# Patient Record
Sex: Male | Born: 1935 | Race: White | Hispanic: No | Smoking: Never smoker
Health system: Southern US, Community
[De-identification: ages and names within clinical notes are randomized; demographics above are authoritative.]

## PROBLEM LIST (undated history)

## (undated) DIAGNOSIS — K769 Liver disease, unspecified: Secondary | ICD-10-CM

## (undated) DIAGNOSIS — J309 Allergic rhinitis, unspecified: Secondary | ICD-10-CM

## (undated) DIAGNOSIS — K219 Gastro-esophageal reflux disease without esophagitis: Secondary | ICD-10-CM

## (undated) DIAGNOSIS — I639 Cerebral infarction, unspecified: Secondary | ICD-10-CM

## (undated) DIAGNOSIS — N400 Enlarged prostate without lower urinary tract symptoms: Secondary | ICD-10-CM

## (undated) DIAGNOSIS — K603 Anal fistula, unspecified: Secondary | ICD-10-CM

## (undated) DIAGNOSIS — Z85820 Personal history of malignant melanoma of skin: Secondary | ICD-10-CM

## (undated) DIAGNOSIS — I340 Nonrheumatic mitral (valve) insufficiency: Secondary | ICD-10-CM

## (undated) DIAGNOSIS — R011 Cardiac murmur, unspecified: Secondary | ICD-10-CM

## (undated) DIAGNOSIS — E7800 Pure hypercholesterolemia, unspecified: Secondary | ICD-10-CM

## (undated) DIAGNOSIS — I2510 Atherosclerotic heart disease of native coronary artery without angina pectoris: Secondary | ICD-10-CM

## (undated) DIAGNOSIS — Z951 Presence of aortocoronary bypass graft: Secondary | ICD-10-CM

## (undated) DIAGNOSIS — Z955 Presence of coronary angioplasty implant and graft: Secondary | ICD-10-CM

## (undated) DIAGNOSIS — Z9889 Other specified postprocedural states: Secondary | ICD-10-CM

## (undated) DIAGNOSIS — I10 Essential (primary) hypertension: Secondary | ICD-10-CM

## (undated) DIAGNOSIS — Z8719 Personal history of other diseases of the digestive system: Secondary | ICD-10-CM

## (undated) DIAGNOSIS — I6522 Occlusion and stenosis of left carotid artery: Secondary | ICD-10-CM

## (undated) DIAGNOSIS — K5710 Diverticulosis of small intestine without perforation or abscess without bleeding: Secondary | ICD-10-CM

## (undated) HISTORY — DX: Gastro-esophageal reflux disease without esophagitis: K21.9

## (undated) HISTORY — DX: Liver disease, unspecified: K76.9

## (undated) HISTORY — PX: CATARACT EXTRACTION W/ INTRAOCULAR LENS  IMPLANT, BILATERAL: SHX1307

## (undated) HISTORY — DX: Atherosclerotic heart disease of native coronary artery without angina pectoris: I25.10

## (undated) HISTORY — DX: Diverticulosis of small intestine without perforation or abscess without bleeding: K57.10

## (undated) HISTORY — PX: TRANSTHORACIC ECHOCARDIOGRAM: SHX275

## (undated) HISTORY — PX: CORONARY ANGIOPLASTY WITH STENT PLACEMENT: SHX49

---
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
## 1986-02-11 HISTORY — PX: CORONARY ARTERY BYPASS GRAFT: SHX141

## 1998-02-11 HISTORY — PX: CORONARY ANGIOPLASTY WITH STENT PLACEMENT: SHX49

## 1998-08-16 ENCOUNTER — Inpatient Hospital Stay (HOSPITAL_COMMUNITY): Admission: EM | Admit: 1998-08-16 | Discharge: 1998-08-18 | Payer: Self-pay | Admitting: Internal Medicine

## 1998-08-16 ENCOUNTER — Encounter: Payer: Self-pay | Admitting: Internal Medicine

## 1999-04-03 ENCOUNTER — Encounter: Payer: Self-pay | Admitting: Geriatric Medicine

## 1999-04-03 ENCOUNTER — Encounter: Admission: RE | Admit: 1999-04-03 | Discharge: 1999-04-03 | Payer: Self-pay | Admitting: Geriatric Medicine

## 1999-07-05 ENCOUNTER — Encounter: Payer: Self-pay | Admitting: Orthopaedic Surgery

## 1999-07-05 ENCOUNTER — Encounter: Admission: RE | Admit: 1999-07-05 | Discharge: 1999-07-05 | Payer: Self-pay | Admitting: Orthopaedic Surgery

## 1999-07-10 ENCOUNTER — Ambulatory Visit (HOSPITAL_BASED_OUTPATIENT_CLINIC_OR_DEPARTMENT_OTHER): Admission: RE | Admit: 1999-07-10 | Discharge: 1999-07-11 | Payer: Self-pay | Admitting: Orthopaedic Surgery

## 1999-07-10 HISTORY — PX: SHOULDER ARTHROSCOPY WITH OPEN ROTATOR CUFF REPAIR: SHX6092

## 2001-02-11 DIAGNOSIS — K5710 Diverticulosis of small intestine without perforation or abscess without bleeding: Secondary | ICD-10-CM

## 2001-02-11 DIAGNOSIS — Z8719 Personal history of other diseases of the digestive system: Secondary | ICD-10-CM

## 2001-02-11 HISTORY — DX: Personal history of other diseases of the digestive system: Z87.19

## 2001-02-11 HISTORY — DX: Diverticulosis of small intestine without perforation or abscess without bleeding: K57.10

## 2001-06-29 ENCOUNTER — Encounter: Admission: RE | Admit: 2001-06-29 | Discharge: 2001-06-29 | Payer: Self-pay | Admitting: Geriatric Medicine

## 2001-06-29 ENCOUNTER — Encounter: Payer: Self-pay | Admitting: Geriatric Medicine

## 2001-09-18 ENCOUNTER — Encounter: Payer: Self-pay | Admitting: Internal Medicine

## 2001-09-18 ENCOUNTER — Encounter: Admission: RE | Admit: 2001-09-18 | Discharge: 2001-09-18 | Payer: Self-pay | Admitting: Internal Medicine

## 2001-09-29 ENCOUNTER — Encounter: Admission: RE | Admit: 2001-09-29 | Discharge: 2001-09-29 | Payer: Self-pay | Admitting: Geriatric Medicine

## 2001-09-29 ENCOUNTER — Encounter: Payer: Self-pay | Admitting: Geriatric Medicine

## 2002-02-11 DIAGNOSIS — K769 Liver disease, unspecified: Secondary | ICD-10-CM

## 2002-02-11 HISTORY — DX: Liver disease, unspecified: K76.9

## 2002-02-25 ENCOUNTER — Encounter (INDEPENDENT_AMBULATORY_CARE_PROVIDER_SITE_OTHER): Payer: Self-pay | Admitting: *Deleted

## 2002-02-25 ENCOUNTER — Ambulatory Visit (HOSPITAL_COMMUNITY): Admission: RE | Admit: 2002-02-25 | Discharge: 2002-02-25 | Payer: Self-pay | Admitting: Gastroenterology

## 2002-12-30 ENCOUNTER — Ambulatory Visit (HOSPITAL_COMMUNITY): Admission: RE | Admit: 2002-12-30 | Discharge: 2002-12-30 | Payer: Self-pay | Admitting: General Surgery

## 2004-04-14 ENCOUNTER — Emergency Department (HOSPITAL_COMMUNITY): Admission: EM | Admit: 2004-04-14 | Discharge: 2004-04-14 | Payer: Self-pay | Admitting: Emergency Medicine

## 2004-04-24 ENCOUNTER — Inpatient Hospital Stay (HOSPITAL_BASED_OUTPATIENT_CLINIC_OR_DEPARTMENT_OTHER): Admission: RE | Admit: 2004-04-24 | Discharge: 2004-04-24 | Payer: Self-pay | Admitting: Interventional Cardiology

## 2004-04-30 ENCOUNTER — Ambulatory Visit (HOSPITAL_COMMUNITY): Admission: RE | Admit: 2004-04-30 | Discharge: 2004-05-01 | Payer: Self-pay | Admitting: Interventional Cardiology

## 2004-05-02 ENCOUNTER — Inpatient Hospital Stay (HOSPITAL_COMMUNITY): Admission: EM | Admit: 2004-05-02 | Discharge: 2004-05-03 | Payer: Self-pay | Admitting: Emergency Medicine

## 2004-05-10 ENCOUNTER — Ambulatory Visit (HOSPITAL_COMMUNITY): Admission: RE | Admit: 2004-05-10 | Discharge: 2004-05-10 | Payer: Self-pay | Admitting: Geriatric Medicine

## 2004-06-11 ENCOUNTER — Encounter (HOSPITAL_COMMUNITY): Admission: RE | Admit: 2004-06-11 | Discharge: 2004-09-09 | Payer: Self-pay | Admitting: Interventional Cardiology

## 2004-09-11 ENCOUNTER — Encounter (HOSPITAL_COMMUNITY): Admission: RE | Admit: 2004-09-11 | Discharge: 2004-12-10 | Payer: Self-pay | Admitting: Interventional Cardiology

## 2005-04-26 ENCOUNTER — Ambulatory Visit (HOSPITAL_COMMUNITY): Admission: RE | Admit: 2005-04-26 | Discharge: 2005-04-26 | Payer: Self-pay | Admitting: Geriatric Medicine

## 2005-08-22 ENCOUNTER — Encounter: Admission: RE | Admit: 2005-08-22 | Discharge: 2005-08-22 | Payer: Self-pay | Admitting: Geriatric Medicine

## 2006-10-13 ENCOUNTER — Emergency Department (HOSPITAL_COMMUNITY): Admission: EM | Admit: 2006-10-13 | Discharge: 2006-10-13 | Payer: Self-pay | Admitting: Emergency Medicine

## 2007-02-25 ENCOUNTER — Encounter: Admission: RE | Admit: 2007-02-25 | Discharge: 2007-02-25 | Payer: Self-pay | Admitting: Geriatric Medicine

## 2007-09-05 ENCOUNTER — Emergency Department (HOSPITAL_COMMUNITY): Admission: EM | Admit: 2007-09-05 | Discharge: 2007-09-05 | Payer: Self-pay | Admitting: Emergency Medicine

## 2007-09-14 ENCOUNTER — Observation Stay (HOSPITAL_COMMUNITY): Admission: EM | Admit: 2007-09-14 | Discharge: 2007-09-15 | Payer: Self-pay | Admitting: Emergency Medicine

## 2007-12-26 ENCOUNTER — Emergency Department (HOSPITAL_COMMUNITY): Admission: EM | Admit: 2007-12-26 | Discharge: 2007-12-26 | Payer: Self-pay | Admitting: Emergency Medicine

## 2010-06-14 ENCOUNTER — Other Ambulatory Visit: Payer: Self-pay | Admitting: Dermatology

## 2010-06-26 NOTE — H&P (Signed)
Jason Velazquez, MIN NO.:  [id number]   MEDICAL RECORD NO.:  [id number]          PATIENT TYPE:  OBV   LOCATION:  4729                         FACILITY:  [facility]   PHYSICIAN:  Georga Hacking, M.D.DATE OF BIRTH:  18-Aug-1928   DATE OF ADMISSION:  09/14/2007  DATE OF DISCHARGE:                              HISTORY & PHYSICAL   REASON FOR ADMISSION:  Chest pain, irregular heartbeat, cough.   HISTORY OF PRESENT ILLNESS:  This 77 year old male has a history of  hyperlipidemia, mild hypertension and had a bypass grafting in 1988 for  angina.  He had stenting of the circumflex and a graft in 2000, and in  2006 was demonstrated to have a patent internal mammary graft to LAD,  patent sequential vein graft to diagonal I and II, a patent vein graft  to the left ventricular branch, and in-stent restenosis of the  circumflex with a J old side branch.  He had PTCA of the side branch and  placement of two drug-eluting stents by Dr. Katrinka Blazing to the circumflex.  There was no insignificant disease of the right coronary artery at that  time.  He has done well since then.  Evidently in May, at a routine  visit Dr. Katrinka Blazing, his atenolol was reduced because of bradycardia.  About  2 weeks ago, he began to experience coughing and felt as if he was  having irregular heart beat and PVCs.  He was seen at the emergency room  on the night of July 25 and was told he had PVCs and was treated with  Protonix.  He was seen at Dr. Venita Sheffield office who sent him to see Dr.  Katrinka Blazing.  He was seen evidently by Dr. Michaelle Copas nurse practitioner who  ordered a Holter monitor.  The Holter monitor showed that he wore this.  Evidently he had been feeling well today.  This afternoon, his wife is  in the process of checking his blood pressures and thought it to be  slightly elevated.  About that time, the patient noted some vague right-  sided aching pain and may have had some mild nausea associated with it.  About  that time, Dr. Michaelle Copas office called with results of the Holter  monitor, and the wife began to describe these symptoms to the office.  They were then advised to give him a nitroglycerin and transport him to  the emergency room.  His coughing, at the present time is concerned that  the coughing in the PVCs are related.  He is not currently having chest  discomfort.  The discomfort he had is different than the previous  symptoms that he had of the time of this bypass.   PAST MEDICAL HISTORY:  1. Mild hypertension.  2. Hyperlipidemia.  3. Mild reflux.  4. Mild prostatism.   PAST SURGERIES:  1. Coronary bypass grafting.  2 . Removal of melanoma.  1. Tonsillectomy.   ALLERGIES:  None.   CURRENT MEDICATIONS:  1. Atenolol 25 mg daily.  2. Hytrin 5 mg daily.  3. Lipitor 20 mg daily.  4. Aspirin 325 mg daily.  5. Protonix 40 mg daily.  6. Fish oil b.i.d.  7. Niaspan 1000 mg at bedtime.   SOCIAL HISTORY:  He has been married.  His daughter is head of nursing  at [employer].  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  Does not smoke or use alcohol to excess.   REVIEW OF SYSTEMS:  His weight has been stable.  He wears glasses.  No  eye, ear, nose or throat problems.  No difficulty swallowing.  He has  had significant cough but has not had fever or other symptoms.  Has not  had any upper respiratory symptoms.  He has had nasal congestion and has  used Afrin in the past, but none recently.  Some sense of some mild  prostatism with nocturia and mild hesitancy.  No history of stroke or  TIA.  No diarrhea or constipation.  Other than as noted above, the  remained for review of systems is unremarkable.   PHYSICAL EXAMINATION:  GENERAL:  He is a pleasant male appearing stated  age.  VITAL SIGNS:  Blood pressure is currently 135/70, pulse is currently 70  and regular.  On observation, the patient was noted be coughing with no  PVCs noted and other times had  PVCs that he was not aware of.  His EKG  was normal except for occasional PVCs.  SKIN:  Warm and dry.  Previous melanoma scar noted.  ENT:  EOMI.  CNS clear.  Fundi not examined.  Pharynx negative.  NECK:  Supple.  No masses, JVD, thyromegaly or bruits.  LUNGS:  Clear.  CARDIOVASCULAR:  Normal S1, S2.  No S3 or murmur.  ABDOMEN:  Soft, nontender.  Femoral distal pulses 2+, no edema.  Point  care enzymes were negative.  EKG as noted above.   STUDIES:  Chest x-ray is unremarkable.   IMPRESSION:  1. Atypical chest pain, doubt angina.  2. Coronary artery disease with previous bypass grafting, previous      stenting of the circumflex and a vein graft.  3. PVCs.  4. Cough of uncertain cause.  5. Hyperlipidemia.  6. Symptoms of prostatism.   RECOMMENDATIONS:  Admit overnight for observation.  Keep n.p.o. if Dr.  Katrinka Blazing wants to do testing in house.  Obtain echocardiogram.  Continue  current medicines.  Treat with DVT prophylaxis for Lovenox.  Continue  current medications at this time.  Discussed atrial PVCs with family.      Georga Hacking, M.D.  Electronically Signed     WST/MEDQ  D:  09/14/2007  T:  09/15/2007  Job:  [id number]   cc:   Lyn Records, M.D.  Georgann Housekeeper, MD

## 2010-06-29 NOTE — Cardiovascular Report (Signed)
NAMEJENSYN, Jason Velazquez NO.:  [id number]   MEDICAL RECORD NO.:  [id number]          PATIENT TYPE:  OIB   LOCATION:  6501                         FACILITY:  [facility]   PHYSICIAN:  Lyn Records III, M.D.DATE OF BIRTH:  May 23, 1928   DATE OF PROCEDURE:  04/24/2004  DATE OF DISCHARGE:                              CARDIAC CATHETERIZATION   INDICATIONS:  Recent angina. Patient has history of coronary artery bypass  grafting 1997. Had stent placed in in the circumflex artery 2000 and also  bypass graft to the right coronary in 2000. He has recently had recurrence  of angina.   PROCEDURE PERFORMED:  1.  Left heart catheterization.  2.  Selective coronary angiography.  3.  Left ventriculography.  4.  Bypass graft angiography.  5.  Left internal mammary artery graft angiography.   DESCRIPTION:  After informed consent a 4-French sheath was placed in the  right femoral artery using the modified Seldinger technique. A 4-French A2  multipurpose catheter was used for hemodynamic recordings, left  ventriculography by hand injection, and selective right and saphenous vein  graft angiography. A #4 4-French left Judkins catheter was used for left  coronary angiography. An internal mammary artery catheter was used for left  internal mammary artery angiography. The patient tolerated the procedure  without complications. 100 mcg of intracoronary nitroglycerin was given into  the left main coronary. No complications occurred.   RESULTS:   I. HEMODYNAMIC DATA:  A.  Left ventricular pressure 121/80.  B.  Aortic pressure 120/60 mmHg.   II. LEFT VENTRICULOGRAPHY:  The left ventricle is normal in size and  demonstrates normal overall contractility, EF 60%. No MR.   III. NATIVE CORONARY ANGIOGRAPHY:  A.  Left main coronary:  The left main  coronary artery is widely patent.  B.  Left anterior descending coronary:  The left anterior descending  coronary artery is totally occluded at its  origin from the left main.  C.  Circumflex artery:  The circumflex coronary artery is large. It  trifurcates on the left lateral wall. There is a small AV groove circumflex  branch that contains ostial 70% narrowing. This is jailed by a stent. Within  the proximal stent in the circumflex there is 90% in-stent restenosis.  D.  Right coronary:  The right coronary artery is patent. Multiple luminal  irregularities are noted throughout the native right.  No high-grade  obstruction is noted. The PDA feeds off of the native right and contains 50%  ostial narrowing, but is otherwise widely patent.   IV. BYPASS GRAFT ANGIOGRAPHY:  A.  Saphenous vein sequential graft to the  first and second diagonal branches:  This graft is widely patent with  luminal irregularities noted.  B.  Saphenous vein graft to the left ventricular branch:  This graft is  widely patent. This graft at the site of previous stent implantation.   V. LEFT INTERNAL MAMMARY GRAFT TO THE LAD:  The LIMA to the LAD is widely  patent.   CONCLUSION:  1.  Significant in-stent restenosis in the proximal circumflex from the  stents placed in 2000.  2.  Widely patent saphenous vein graft as outlined above.  3.  Widely patent left internal mammary artery to the left anterior      descending.  4.  Total occlusion of the left anterior descending at the ostium of the      left main.  5.  Total occlusion of the right coronary beyond the posterior descending      artery branch. The right coronary otherwise does not contain significant      obstruction and the posterior descending artery contains 50% ostial      narrowing.   PLAN:  PCI and stent of the circumflex in-stent restenosis as noted.      HWS/MEDQ  D:  04/24/2004  T:  04/24/2004  Job:  [id number]   cc:   Hal T. Stoneking, M.D.  [address] [place], [place] [address]  Fax: [phone]

## 2010-06-29 NOTE — Op Note (Signed)
   NAMEQUANDARIUS, NILL NO.:  [id number]   MEDICAL RECORD NO.:  [id number]                   PATIENT TYPE:  AMB   LOCATION:  ENDO                                 FACILITY:  [facility]   PHYSICIAN:  Danise Edge, M.D.                DATE OF BIRTH:  1928-04-01   DATE OF PROCEDURE:  02/25/2002  DATE OF DISCHARGE:                                 OPERATIVE REPORT   REFERRING PHYSICIAN:  Hal T. Stoneking, M.D.   PROCEDURE:  Colonoscopy and polypectomy.   INDICATIONS:  The patient is a 77 year old male born 04-29-1928. The  patient had an episode of painless hematochezia which has resolved. He is  scheduled to undergo diagnostic colonoscopy and possible polypectomy to  prevent colon cancer.   ENDOSCOPIST:  Danise Edge, M.D.   PREMEDICATION:  1. Versed 5 mg.  2. Demerol 50 mg.   ENDOSCOPE:  Olympus pediatric colonoscope.   DESCRIPTION OF PROCEDURE:  After informed consent was obtained the patient  was placed in the left lateral decubitus position. I administered  intravenous Demerol and intravenous Versed to achieve conscious sedation for  the procedure. The patient's blood pressure, O2 saturation and cardiac  rhythm were monitored throughout the procedure and documented in the medical  record.   Anal inspection was normal. Digital rectal exam revealed a nonnodular  prostate. The Olympus pediatric video colonoscope was introduced into the  rectum advanced to the cecum. A normal ileocecal valve was intubated and the  distal ileum was inspected. Colonic preparation for the examination today  was excellent.   Rectum:  Normal.  Sigmoid colon and descending colon:  Normal.  Splenic flexure:  Normal.  Transverse colon:  Normal.  Hepatic flexure:  Normal.  Ascending colon:  Normal.  Cecum and ileocecal valve:  From the proximal cecum, a 3 mm sessile polyp  was lifted by submucosal saline injection and removed with the  electrocautery snare.  Biopsies prior to  polypectomy were sent for  pathological evaluation. I was unable to retrieve the entire polyp post  submucosal saline injection and polypectomy.    ASSESSMENT:  A 3 mm sessile polyp was removed from the proximal cecum by  submucosal saline lift with electrocautery snare polypectomy Otherwise  normal proctocolonoscopy to the cecum.   RECOMMENDATIONS:  Repeat colonoscopy in 5 years.                                                Danise Edge, M.D.    MJ/MEDQ  D:  02/25/2002  T:  02/25/2002  Job:  [id number]   cc:   Hal T. Stoneking, M.D.  [address] [place], [place] [address]  Fax: [phone]

## 2010-06-29 NOTE — Op Note (Signed)
[place]  Patient:    Jason Velazquez, Jason Velazquez                MRN: [id number] Proc. Date: 07/10/99 Adm. Date:  [id number] Attending:  Marcene Corning                           Operative Report  PREOPERATIVE DIAGNOSES: 1. Right shoulder rotator cuff tear. 2. Right shoulder degeneration.  POSTOPERATIVE DIAGNOSES: 1. Right shoulder rotator cuff tear. 2. Right shoulder degeneration.  PROCEDURES: 1. Right shoulder arthroscopic debridement. 2. Right shoulder open rotator cuff repair.  ANESTHESIA:  General and block.  ATTENDING SURGEON:  Lubertha Basque. Jerl Santos, M.D.  ASSISTANT:  Prince Rome, P.A.  INDICATIONS FOR PROCEDURE:  The patient is a 77 year old man with a many month history of severe right shoulder pain.  This has persisted despite time, activity restriction, and two subacromial injections, both of which afforded him only transient relief.  He was presumed to have a full-thickness rotator cuff tear as he had some significant weakness.  The planned procedure at this point is for arthroscopic intervention with probable arthroscopic or open repair.  INFORMED CONSENT:  The procedures were discussed with the patient, and informed operative consent was obtained after discussion of possible complications of reaction to anesthesia, infection, and shoulder stiffness.  DESCRIPTION OF PROCEDURE:  The patient was taken to the operating suite where a general anesthetic was applied after a regional block was applied in the preanesthesia area.  He was positioned in beach chair position and prepped and draped in the normal sterile fashion.  After the administration of preoperative IV antibiotics, an arthroscopy of the right shoulder was performed through two portals.  The glenohumeral joint showed mild degenerative changes with some mild fraying of the labrum, which was addressed with a debridement.  The biceps tendon and anchor were intact.  He  had a large rotator cuff tear involving about 4 cm at the attachment of the cuff. However, this tissue could be mobilized back to the greater tuberosity.  An arthroscopic acromioplasty was performed back to a flat surface.  The undersurface of the [facility] joint was examined and was decompressed, but no formal decompression of the joint was performed.  At this point, the lateral portal was extended into about a 1 inch incision.  Dissection was carried down through the deltoid fibers to the bursa, which was excised.  The rotator cuff tear was easily visualized.  Two tag stitches were placed in the cuff, and it could be pulled over to the greater tuberosity area.  A bur was used to freshen the bone below this area, followed by placement of two Bio corkscrew anchors.  Each of these had two #2 Ethibond sutures emanating.  These sutures were then passed through the rotator cuff in mattress fashion.  The tag sutures on the cuff were passed through the greater tuberosity through bony tunnels.  These were also Ethibond sutures, and they were tied over the greater tuberosity.  The four other Ethibond sutures were also tied in mattress fashion on top of the cuff, bringing the structure back down into a nice, bony bed of bleeding bone.  The cuff appeared to be well repaired, and it was under no undue tension even with the arm at the patients side.  The wound was thoroughly irrigated, followed by closure of both layers of deltoid fascia with #0 Vicryl suture.  Subcutaneous tissues were reapproximated with  2-0 undyed Vicryl, followed by skin closure with nylon.  Adaptic was placed on the wound, followed by dry gauze and tape.  The patient was placed in a sling. Estimated blood loss and intraoperative fluids can be obtained from anesthesia records.  DISPOSITION:  The patient was extubated in the operating room and taken to the recovery room in stable condition.  PLAN:  For him to stay over night for pain  control.  He will go home in the morning. DD:  07/10/99 TD:  07/11/99 Job: [id number]

## 2010-06-29 NOTE — Cardiovascular Report (Signed)
NAMEDARRAGH, NAY NO.:  [id number]   MEDICAL RECORD NO.:  [id number]          PATIENT TYPE:  OIB   LOCATION:  2899                         FACILITY:  [facility]   PHYSICIAN:  Lyn Records III, M.D.DATE OF BIRTH:  [birth year]   DATE OF PROCEDURE:  04/30/2004  DATE OF DISCHARGE:                              CARDIAC CATHETERIZATION   INDICATIONS:  Recent angina and demonstrated in-stent restenosis in the  proximal circumflex and mid circumflex beyond a stent strut. The patient has  been having exertional angina.   PROCEDURE:  1.  Left heart catheterization.  2.  Stent of the proximal and mid circumflex with drug-eluting stent, PTCA      of the mid circumflex through stent strut.   DESCRIPTION:  The patient was brought to the cath lab. The patient had  previously undergone instructions concerning the stent procedure and risks.  A 7-French sheath was started in the right femoral artery using modified  Seldinger technique. Angiomax bolus followed by an infusion was started. ACT  was documented greater than 400 seconds. We then used a double wire  technique. One BMW wire down the obtuse marginal/circumflex, the other down  the circumflex and through the distal stent into the mid circumflex. We then  dilated the mid circumflex restenotic region through the stent strut with a  20 x 9 Maverick balloon. Two balloon inflations were performed. Pressure of  up to 14 atmospheres. We then predilated the proximal circumflex in-stent  restenosis with a 3.0 x 12 mm Maverick balloon and then deployed a 3.0 x 23-  mm  long Cypher stent totally encompassing the previously tandemly stented  region. The peak pressure was up to 16 atmospheres. We then postdilated the  proximal stent region with a 3.25 x 12 mm Quantum balloon at 13 atmospheres.  TIMI grade 3 flow was noted. The mid circumflex with two layers of stent  remained patent through the stent struts. TIMI grade 3 flow was noted.  No  complications occurred. AngioSeal arteriotomy closure was performed without  complications. Visualization of the femoral region was performed prior to  Angio-Seal deployment.   CONCLUSION:  1.  Successful circumflex in-stent restenosis therapy with a 23 mm long x      3.0 mm Cypher stent totally covering the previously stented region.  2.  Successful angioplasty of high-grade restenosis in the mid circumflex      via the previously placed stent with reduction in stenosis from 95% to      80% with TIMI III flow.   PLAN:  Discontinue Angiomax.  Aspirin and Plavix. Plavix should be continued  for least the year.      HWS/MEDQ  D:  04/30/2004  T:  04/30/2004  Job:  [id number]   cc:   Hal T. Stoneking, M.D.  [address] [place], [place] [address]  Fax: [phone]

## 2010-11-09 LAB — CK TOTAL AND CKMB (NOT AT ARMC): Relative Index: 1.9

## 2010-11-09 LAB — POCT CARDIAC MARKERS
CKMB, poc: 1.1
Myoglobin, poc: 102
Myoglobin, poc: 68.7
Troponin i, poc: <0.05
Troponin i, poc: <0.05

## 2010-11-09 LAB — DIFFERENTIAL
Basophils Absolute: 0
Basophils Relative: 1
Lymphs Abs: 1.9
Monocytes Relative: 10
Neutro Abs: 3.8
Neutrophils Relative %: 58

## 2010-11-09 LAB — CBC
HCT: 37.8 — ABNORMAL LOW
Hemoglobin: 12.8 — ABNORMAL LOW
MCHC: 33.6
MCV: 94.4
RBC: 4.41
RDW: 12.6
RDW: 12.6

## 2010-11-09 LAB — POCT I-STAT, CHEM 8
BUN: 11
Chloride: 99
Creatinine, Ser: 1.1
Glucose, Bld: 95
Hemoglobin: 14.3
Potassium: 4.3
Sodium: 136

## 2010-11-09 LAB — CARDIAC PANEL(CRET KIN+CKTOT+MB+TROPI)
CK, MB: 1.7
Relative Index: 1.4
Total CK: 121

## 2010-11-09 LAB — TSH: TSH: 7.953 — ABNORMAL HIGH

## 2010-11-09 LAB — COMPREHENSIVE METABOLIC PANEL
Albumin: 3.7
BUN: 6
Creatinine, Ser: 0.91
Glucose, Bld: 136 — ABNORMAL HIGH
Potassium: 3.4 — ABNORMAL LOW
Total Protein: 6.1

## 2010-11-09 LAB — PROTIME-INR: INR: 1

## 2010-11-13 LAB — BASIC METABOLIC PANEL
BUN: 9
CO2: 26
Calcium: 9.1
Chloride: 100
Creatinine, Ser: 0.87
GFR calc Af Amer: >60

## 2010-11-13 LAB — DIFFERENTIAL
Basophils Absolute: 0
Basophils Relative: 1
Eosinophils Absolute: 0
Monocytes Absolute: 0.6
Monocytes Relative: 9
Neutro Abs: 4.2

## 2010-11-13 LAB — URINALYSIS, ROUTINE W REFLEX MICROSCOPIC
Bilirubin Urine: NEGATIVE
Glucose, UA: NEGATIVE
Hgb urine dipstick: NEGATIVE
Ketones, ur: NEGATIVE
Protein, ur: NEGATIVE
Urobilinogen, UA: 0.2

## 2010-11-13 LAB — CBC
MCHC: 33.7
MCV: 94.8
Platelets: 240
RBC: 4.44
WBC: 6.1

## 2010-11-13 LAB — CK TOTAL AND CKMB (NOT AT ARMC)
CK, MB: 2.4
Total CK: 152

## 2010-11-23 LAB — I-STAT 8, (EC8 V) (CONVERTED LAB)
BUN: 4 — ABNORMAL LOW
Bicarbonate: 22.5
Chloride: 104
Glucose, Bld: 95
HCT: 42
Operator id: 279831
pCO2, Ven: 43 — ABNORMAL LOW
pH, Ven: 7.326 — ABNORMAL HIGH

## 2010-11-23 LAB — POCT I-STAT CREATININE
Creatinine, Ser: 0.9
Operator id: 279831

## 2010-11-23 LAB — CLOSTRIDIUM DIFFICILE EIA: C difficile Toxins A+B, EIA: NEGATIVE

## 2010-11-23 LAB — STOOL CULTURE

## 2011-02-13 DIAGNOSIS — N401 Enlarged prostate with lower urinary tract symptoms: Secondary | ICD-10-CM | POA: Diagnosis not present

## 2011-02-13 DIAGNOSIS — N138 Other obstructive and reflux uropathy: Secondary | ICD-10-CM | POA: Diagnosis not present

## 2011-02-13 DIAGNOSIS — N3941 Urge incontinence: Secondary | ICD-10-CM | POA: Diagnosis not present

## 2011-02-13 DIAGNOSIS — R339 Retention of urine, unspecified: Secondary | ICD-10-CM | POA: Diagnosis not present

## 2011-02-13 DIAGNOSIS — R39198 Other difficulties with micturition: Secondary | ICD-10-CM | POA: Diagnosis not present

## 2011-02-19 ENCOUNTER — Telehealth: Payer: Self-pay | Admitting: Cardiology

## 2011-02-19 NOTE — Telephone Encounter (Signed)
I spoke with Pam and made her aware that this is Dr Sherilyn Cooter Smith's patient.

## 2011-02-19 NOTE — Telephone Encounter (Signed)
New Msg: Pam from Dr. Belva Crome office calling from surgical clearance for TURP. Please return call to discuss further.

## 2011-02-26 DIAGNOSIS — I059 Rheumatic mitral valve disease, unspecified: Secondary | ICD-10-CM | POA: Diagnosis not present

## 2011-02-26 DIAGNOSIS — I10 Essential (primary) hypertension: Secondary | ICD-10-CM | POA: Diagnosis not present

## 2011-02-26 DIAGNOSIS — I2510 Atherosclerotic heart disease of native coronary artery without angina pectoris: Secondary | ICD-10-CM | POA: Diagnosis not present

## 2011-03-01 DIAGNOSIS — I059 Rheumatic mitral valve disease, unspecified: Secondary | ICD-10-CM | POA: Diagnosis not present

## 2011-03-12 ENCOUNTER — Other Ambulatory Visit: Payer: Self-pay | Admitting: Urology

## 2011-03-27 ENCOUNTER — Other Ambulatory Visit: Payer: Self-pay | Admitting: Geriatric Medicine

## 2011-03-27 DIAGNOSIS — G459 Transient cerebral ischemic attack, unspecified: Secondary | ICD-10-CM | POA: Diagnosis not present

## 2011-03-27 DIAGNOSIS — R200 Anesthesia of skin: Secondary | ICD-10-CM

## 2011-03-27 DIAGNOSIS — I10 Essential (primary) hypertension: Secondary | ICD-10-CM | POA: Diagnosis not present

## 2011-03-28 ENCOUNTER — Ambulatory Visit
Admission: RE | Admit: 2011-03-28 | Discharge: 2011-03-28 | Disposition: A | Discharge status: Home / Self Care | Payer: Medicare Other | Source: Ambulatory Visit | Attending: Geriatric Medicine | Admitting: Geriatric Medicine

## 2011-03-28 DIAGNOSIS — G459 Transient cerebral ischemic attack, unspecified: Secondary | ICD-10-CM | POA: Diagnosis not present

## 2011-03-28 DIAGNOSIS — I6529 Occlusion and stenosis of unspecified carotid artery: Secondary | ICD-10-CM | POA: Diagnosis not present

## 2011-03-28 DIAGNOSIS — R200 Anesthesia of skin: Secondary | ICD-10-CM

## 2011-03-28 DIAGNOSIS — R202 Paresthesia of skin: Secondary | ICD-10-CM

## 2011-03-28 DIAGNOSIS — R209 Unspecified disturbances of skin sensation: Secondary | ICD-10-CM | POA: Diagnosis not present

## 2011-04-02 ENCOUNTER — Encounter (HOSPITAL_COMMUNITY): Payer: Self-pay | Admitting: Pharmacy Technician

## 2011-04-03 ENCOUNTER — Ambulatory Visit (HOSPITAL_COMMUNITY)
Admission: RE | Admit: 2011-04-03 | Discharge: 2011-04-03 | Disposition: A | Discharge status: Home / Self Care | Payer: Medicare Other | Source: Ambulatory Visit | Attending: Urology | Admitting: Urology

## 2011-04-03 ENCOUNTER — Encounter (HOSPITAL_COMMUNITY): Payer: Self-pay

## 2011-04-03 ENCOUNTER — Encounter (HOSPITAL_COMMUNITY)
Admission: RE | Admit: 2011-04-03 | Discharge: 2011-04-03 | Disposition: A | Discharge status: Home / Self Care | Payer: Medicare Other | Source: Ambulatory Visit | Attending: Urology | Admitting: Urology

## 2011-04-03 DIAGNOSIS — N320 Bladder-neck obstruction: Secondary | ICD-10-CM | POA: Insufficient documentation

## 2011-04-03 DIAGNOSIS — Z01812 Encounter for preprocedural laboratory examination: Secondary | ICD-10-CM | POA: Insufficient documentation

## 2011-04-03 DIAGNOSIS — I2510 Atherosclerotic heart disease of native coronary artery without angina pectoris: Secondary | ICD-10-CM | POA: Insufficient documentation

## 2011-04-03 DIAGNOSIS — Z01818 Encounter for other preprocedural examination: Secondary | ICD-10-CM | POA: Insufficient documentation

## 2011-04-03 DIAGNOSIS — N401 Enlarged prostate with lower urinary tract symptoms: Secondary | ICD-10-CM | POA: Insufficient documentation

## 2011-04-03 DIAGNOSIS — Z951 Presence of aortocoronary bypass graft: Secondary | ICD-10-CM | POA: Diagnosis not present

## 2011-04-03 DIAGNOSIS — N138 Other obstructive and reflux uropathy: Secondary | ICD-10-CM | POA: Insufficient documentation

## 2011-04-03 DIAGNOSIS — Z01811 Encounter for preprocedural respiratory examination: Secondary | ICD-10-CM | POA: Diagnosis not present

## 2011-04-03 HISTORY — DX: Pure hypercholesterolemia, unspecified: E78.00

## 2011-04-03 HISTORY — DX: Cardiac murmur, unspecified: R01.1

## 2011-04-03 HISTORY — DX: Benign prostatic hyperplasia without lower urinary tract symptoms: N40.0

## 2011-04-03 HISTORY — DX: Atherosclerotic heart disease of native coronary artery without angina pectoris: I25.10

## 2011-04-03 HISTORY — DX: Essential (primary) hypertension: I10

## 2011-04-03 LAB — CBC
HCT: 38.6 % — ABNORMAL LOW (ref 39.0–52.0)
Platelets: 201 10*3/uL (ref 150–400)
RBC: 4.22 MIL/uL (ref 4.22–5.81)
RDW: 12.4 % (ref 11.5–15.5)
WBC: 6.2 10*3/uL (ref 4.0–10.5)

## 2011-04-03 LAB — SURGICAL PCR SCREEN: Staphylococcus aureus: NEGATIVE

## 2011-04-03 LAB — BASIC METABOLIC PANEL
BUN: 13 mg/dL (ref 6–23)
CO2: 28 mEq/L (ref 19–32)
Chloride: 98 mEq/L (ref 96–112)
GFR calc Af Amer: 90 mL/min — ABNORMAL LOW (ref >90)
Potassium: 4.7 mEq/L (ref 3.5–5.1)

## 2011-04-03 NOTE — Patient Instructions (Signed)
[address]rist  04/03/2011   Your procedure is scheduled on:  04-09-11  Report to [facility] at 515  AM.  Call this number if you have problems the morning of surgery: [phone]   Remember:   Do not eat food or drink liquids:After Midnight.  .  Take these medicines the morning of surgery with A SIP OF WATER: atenolol, saline nasal spray   Do not wear jewelry or make up.  Do not wear lotions, powders, or perfumes.Do not wear deodorant.    Do not bring valuables to the hospital.  Contacts, dentures or bridgework may not be worn into surgery.  Leave suitcase in the car. After surgery it may be brought to your room.  For patients admitted to the hospital, checkout time is 11:00 AM the day of discharge.     Special Instructions: CHG Shower Use Special Wash: 1/2 bottle night before surgery and 1/2 bottle morning of surgery.neck down avoid private area   Please read over the following fact sheets that you were given: MRSA Information  Cain S[facility] pre op nurse phone number [phone], call if needed

## 2011-04-03 NOTE — Pre-Procedure Instructions (Signed)
[facility] 03-01-11 on chart lov note dr Halina Andreas cardiology5-9-12 on chart [facility] cardiology 06-20-10

## 2011-04-04 NOTE — Pre-Procedure Instructions (Signed)
Talked with Jason Velazquez for Dr. Annabell Howells and informed her to inform him of pt.'s abnormal sodium from BMET.

## 2011-04-08 NOTE — H&P (Signed)
ive Problems Problems  1. Benign Prostatic Hypertrophy With Urinary Obstruction 600.01 2. Incomplete Emptying Of Bladder 788.21 3. Urge Incontinence Of Urine 788.31 4. Urinary Stream Is Smaller 788.62  History of Present Illness     Mr. Sultana returns today in f/u for further evaluation of his voiding symptoms. He has BPH with BOO and urge incontinence.  His IPSS is 23 and his PVR is 144.   Cystoscopy today shows a 5cm prostate with obstruction but no middle lobe.  His PF is 8cc/sec.   Past Medical History Problems  1. History of  Coronary Artery Disease V12.59 2. History of  Esophageal Reflux 530.81 3. History of  Gastric Ulcer 531.90 4. History of  Hypercholesterolemia 272.0 5. History of  Hypertension 401.9 6. History of  Murmurs 785.2  Surgical History Problems  1. History of  CABG (CABG) V45.81 2. History of  Cataract Surgery 3. History of  Cath Stent Placement 4. History of  Rotator Cuff Repair  Current Meds 1. Aspirin 325 MG Oral Tablet; Therapy: (Recorded:22Oct2012) to 2. Atenolol 25 MG Oral Tablet; Therapy: (Recorded:22Oct2012) to 3. Colace CAPS; Therapy: (Recorded:22Oct2012) to 4. Hytrin 5 MG CAPS; Therapy: (Recorded:22Oct2012) to 5. Lipitor 20 MG Oral Tablet; Therapy: (Recorded:22Oct2012) to 6. MiraLax Oral Powder; Therapy: (Recorded:22Oct2012) to 7. Multi-Vitamin Oral Tablet; Therapy: (Recorded:22Oct2012) to 8. Niacin TABS; Therapy: (Recorded:22Oct2012) to 9. PriLOSEC 20 MG Oral Capsule Delayed Release; Therapy: (Recorded:22Oct2012) to  Allergies Medication  1. Iodine SOLN 2. Adhesive Tape 1/2"x10yd TAPE  Family History Problems  1. Paternal history of  Acute Myocardial Infarction V17.3 2. Maternal history of  Breast Cancer V16.3 3. Fraternal history of  Colon Cancer V16.0 4. Family history of  Family Health Status Number Of Children 2 sons(1deceased) 1 daughter 5. Fraternal history of  Nephrolithiasis 6. Paternal history of  Stroke Syndrome  V17.1  Social History Problems  1. Caffeine Use 3-4 coffee or tea per day 2. Marital History - Currently Married 3. Never A Smoker 4. Retired From Work Denied  5. History of  Alcohol Use 6. History of  Tobacco Use 305.1  Results/Data Urine [Data Includes: Last 1 Day]   02Jan2013  COLOR YELLOW   APPEARANCE CLEAR   SPECIFIC GRAVITY 1.010   pH 6.0   GLUCOSE NEG mg/dL  BILIRUBIN NEG   KETONE NEG mg/dL  BLOOD NEG   PROTEIN NEG mg/dL  UROBILINOGEN 0.2 mg/dL  NITRITE NEG   LEUKOCYTE ESTERASE NEG    Flow Rate: Voided 298 ml. A peak flow rate of 34ml/s, mean flow rate of 58ml/s and reduced with intermittancy. flow curve .  PVR: Ultrasound PVR 144 ml.  IPSS: The IPSS today is 23     Procedure  Procedure: Cystoscopy  Indication: Lower Urinary Tract Symptoms.  Informed Consent: Risks, benefits, and potential adverse events were discussed and informed consent was obtained from the patient.  Prep: The patient was prepped with betadine.  Procedure Note:  Urethral meatus:. No abnormalities.  Anterior urethra: No abnormalities.  Prostatic urethra: No abnormalities . Estimated length was 5 cm. There was visual obstruction of the prostatic urethra. The lateral and median prostatic lobes were enlarged. No intravesical median lobe was visualized.  Bladder: Visulization was clear. The ureteral orifices were in the normal anatomic position bilaterally and had clear efflux of urine. A systematic survey of the bladder demonstrated no bladder tumors or stones. The mucosa was smooth without abnormalities. Examination of the bladder demonstrated moderate trabeculation. The patient tolerated the procedure well.  Complications: None.  Assessment Assessed  1. Benign Prostatic Hypertrophy With Urinary Obstruction 600.01 2. Urge Incontinence Of Urine 788.31 3. Urinary Stream Is Smaller 788.62 4. Incomplete Emptying Of Bladder 788.21   He has BPH with BOO and UUI with a slow stream.    Plan Benign Prostatic Hypertrophy With Urinary Obstruction (600.01)  1. Follow-up PRN Office  Follow-up  Requested for: 02Jan2013   I reviewed his options including the addition of finasteride, CTT and TURP and briefly mentioned other options including laser and TUNA.   I reviewed the risks of each in detail along with the likelyhood and time required for success. He was given pamplets on CTT and TURP and is going to review those. I believe TURP would provide him the best, most immediate results.

## 2011-04-09 ENCOUNTER — Encounter (HOSPITAL_COMMUNITY): Payer: Self-pay | Admitting: Anesthesiology

## 2011-04-09 ENCOUNTER — Encounter (HOSPITAL_COMMUNITY): Payer: Self-pay | Admitting: *Deleted

## 2011-04-09 ENCOUNTER — Ambulatory Visit (HOSPITAL_COMMUNITY): Payer: Medicare Other | Admitting: Anesthesiology

## 2011-04-09 ENCOUNTER — Encounter (HOSPITAL_COMMUNITY)
Admission: RE | Disposition: A | Discharge status: Home / Self Care | Payer: Self-pay | Source: Ambulatory Visit | Attending: Urology

## 2011-04-09 ENCOUNTER — Observation Stay (HOSPITAL_COMMUNITY)
Admission: RE | Admit: 2011-04-09 | Discharge: 2011-04-11 | Disposition: A | Discharge status: Home / Self Care | Payer: Medicare Other | Source: Ambulatory Visit | Attending: Urology | Admitting: Urology

## 2011-04-09 DIAGNOSIS — N320 Bladder-neck obstruction: Secondary | ICD-10-CM | POA: Insufficient documentation

## 2011-04-09 DIAGNOSIS — Z7982 Long term (current) use of aspirin: Secondary | ICD-10-CM | POA: Insufficient documentation

## 2011-04-09 DIAGNOSIS — N400 Enlarged prostate without lower urinary tract symptoms: Secondary | ICD-10-CM | POA: Diagnosis present

## 2011-04-09 DIAGNOSIS — N138 Other obstructive and reflux uropathy: Principal | ICD-10-CM | POA: Insufficient documentation

## 2011-04-09 DIAGNOSIS — E7800 Pure hypercholesterolemia, unspecified: Secondary | ICD-10-CM | POA: Diagnosis not present

## 2011-04-09 DIAGNOSIS — N401 Enlarged prostate with lower urinary tract symptoms: Secondary | ICD-10-CM | POA: Insufficient documentation

## 2011-04-09 DIAGNOSIS — R39198 Other difficulties with micturition: Secondary | ICD-10-CM | POA: Insufficient documentation

## 2011-04-09 DIAGNOSIS — K219 Gastro-esophageal reflux disease without esophagitis: Secondary | ICD-10-CM | POA: Insufficient documentation

## 2011-04-09 DIAGNOSIS — Z951 Presence of aortocoronary bypass graft: Secondary | ICD-10-CM | POA: Insufficient documentation

## 2011-04-09 DIAGNOSIS — N3941 Urge incontinence: Secondary | ICD-10-CM | POA: Insufficient documentation

## 2011-04-09 DIAGNOSIS — I10 Essential (primary) hypertension: Secondary | ICD-10-CM | POA: Diagnosis not present

## 2011-04-09 DIAGNOSIS — Z79899 Other long term (current) drug therapy: Secondary | ICD-10-CM | POA: Diagnosis not present

## 2011-04-09 DIAGNOSIS — N4289 Other specified disorders of prostate: Secondary | ICD-10-CM | POA: Diagnosis not present

## 2011-04-09 DIAGNOSIS — R339 Retention of urine, unspecified: Secondary | ICD-10-CM | POA: Diagnosis not present

## 2011-04-09 DIAGNOSIS — N139 Obstructive and reflux uropathy, unspecified: Secondary | ICD-10-CM | POA: Diagnosis not present

## 2011-04-09 HISTORY — PX: TRANSURETHRAL RESECTION OF PROSTATE: SHX73

## 2011-04-09 SURGERY — TRANSURETHRAL RESECTION OF THE PROSTATE WITH GYRUS INSTRUMENTS
Anesthesia: General | Site: Groin | Wound class: Clean Contaminated

## 2011-04-09 MED ORDER — DIPHENHYDRAMINE HCL 50 MG/ML IJ SOLN
INTRAMUSCULAR | Status: AC
  Administered 2011-04-09: 25 mg via INTRAVENOUS
  Filled 2011-04-09: qty 1

## 2011-04-09 MED ORDER — SIMVASTATIN 20 MG PO TABS
20.0000 mg | ORAL_TABLET | Freq: Every day | ORAL | Status: DC
  Administered 2011-04-09 – 2011-04-11 (×3): 20 mg via ORAL
  Filled 2011-04-09 (×3): qty 1

## 2011-04-09 MED ORDER — 0.9 % SODIUM CHLORIDE (POUR BTL) OPTIME
TOPICAL | Status: DC | PRN
  Administered 2011-04-09: 1000 mL

## 2011-04-09 MED ORDER — ADULT MULTIVITAMIN W/MINERALS CH
1.0000 | ORAL_TABLET | Freq: Every day | ORAL | Status: DC
  Administered 2011-04-09 – 2011-04-11 (×3): 1 via ORAL
  Filled 2011-04-09 (×3): qty 1

## 2011-04-09 MED ORDER — TERAZOSIN HCL 5 MG PO CAPS
5.0000 mg | ORAL_CAPSULE | Freq: Every day | ORAL | Status: DC
  Administered 2011-04-09 – 2011-04-10 (×2): 5 mg via ORAL
  Filled 2011-04-09 (×3): qty 1

## 2011-04-09 MED ORDER — ACETAMINOPHEN 10 MG/ML IV SOLN
INTRAVENOUS | Status: DC | PRN
  Administered 2011-04-09: 1000 mg via INTRAVENOUS

## 2011-04-09 MED ORDER — LIDOCAINE HCL (CARDIAC) 20 MG/ML IV SOLN
INTRAVENOUS | Status: DC | PRN
  Administered 2011-04-09: 100 mg via INTRAVENOUS

## 2011-04-09 MED ORDER — CIPROFLOXACIN HCL 250 MG PO TABS
250.0000 mg | ORAL_TABLET | Freq: Two times a day (BID) | ORAL | Status: DC
  Administered 2011-04-09 – 2011-04-11 (×5): 250 mg via ORAL
  Filled 2011-04-09 (×6): qty 1

## 2011-04-09 MED ORDER — ONDANSETRON HCL 4 MG/2ML IJ SOLN
INTRAMUSCULAR | Status: DC | PRN
  Administered 2011-04-09: 4 mg via INTRAVENOUS

## 2011-04-09 MED ORDER — ZOLPIDEM TARTRATE 5 MG PO TABS: 5.0000 mg | ORAL_TABLET | Freq: Every evening | ORAL | Status: DC | PRN

## 2011-04-09 MED ORDER — CIPROFLOXACIN IN D5W 400 MG/200ML IV SOLN
400.0000 mg | INTRAVENOUS | Status: AC
  Administered 2011-04-09: 400 mg via INTRAVENOUS

## 2011-04-09 MED ORDER — SALINE SPRAY 0.65 % NA SOLN
1.0000 | Freq: Two times a day (BID) | NASAL | Status: DC
  Administered 2011-04-09 (×2): 4 via NASAL
  Administered 2011-04-10 (×2): 2 via NASAL
  Administered 2011-04-11: 1 via NASAL
  Filled 2011-04-09: qty 44

## 2011-04-09 MED ORDER — SODIUM CHLORIDE 0.9 % IR SOLN
Status: DC | PRN
  Administered 2011-04-09: 11000 mL

## 2011-04-09 MED ORDER — NIACIN 500 MG PO TABS
1000.0000 mg | ORAL_TABLET | Freq: Every day | ORAL | Status: DC
  Administered 2011-04-09: 1000 mg via ORAL
  Filled 2011-04-09 (×2): qty 2

## 2011-04-09 MED ORDER — DIPHENHYDRAMINE HCL 50 MG/ML IJ SOLN
25.0000 mg | Freq: Four times a day (QID) | INTRAMUSCULAR | Status: DC | PRN
  Administered 2011-04-09: 25 mg via INTRAVENOUS
  Filled 2011-04-09: qty 1

## 2011-04-09 MED ORDER — PROPOFOL 10 MG/ML IV EMUL
INTRAVENOUS | Status: DC | PRN
  Administered 2011-04-09: 200 mg via INTRAVENOUS

## 2011-04-09 MED ORDER — POTASSIUM CHLORIDE IN NACL 20-0.45 MEQ/L-% IV SOLN
INTRAVENOUS | Status: DC
  Administered 2011-04-09 – 2011-04-11 (×5): via INTRAVENOUS
  Filled 2011-04-09 (×8): qty 1000

## 2011-04-09 MED ORDER — DOCUSATE SODIUM 100 MG PO CAPS
100.0000 mg | ORAL_CAPSULE | Freq: Two times a day (BID) | ORAL | Status: DC
  Administered 2011-04-09 – 2011-04-11 (×5): 100 mg via ORAL
  Filled 2011-04-09 (×6): qty 1

## 2011-04-09 MED ORDER — POLYVINYL ALCOHOL 1.4 % OP SOLN
1.0000 [drp] | Freq: Two times a day (BID) | OPHTHALMIC | Status: DC
  Administered 2011-04-09 – 2011-04-11 (×4): 1 [drp] via OPHTHALMIC
  Filled 2011-04-09: qty 15

## 2011-04-09 MED ORDER — LACTATED RINGERS IV SOLN
INTRAVENOUS | Status: DC | PRN
  Administered 2011-04-09: 07:00:00 via INTRAVENOUS

## 2011-04-09 MED ORDER — FENTANYL CITRATE 0.05 MG/ML IJ SOLN
INTRAMUSCULAR | Status: DC | PRN
  Administered 2011-04-09 (×2): 25 ug via INTRAVENOUS

## 2011-04-09 MED ORDER — ONDANSETRON HCL 4 MG/2ML IJ SOLN: 4.0000 mg | INTRAMUSCULAR | Status: DC | PRN

## 2011-04-09 MED ORDER — SALINE NASAL SPRAY 0.65 % NA SOLN: 1.0000 | Freq: Two times a day (BID) | NASAL | Status: DC

## 2011-04-09 MED ORDER — ATENOLOL 25 MG PO TABS
25.0000 mg | ORAL_TABLET | Freq: Every day | ORAL | Status: DC
Start: 2011-04-10 — End: 2011-04-11
  Administered 2011-04-10 – 2011-04-11 (×2): 25 mg via ORAL
  Filled 2011-04-09 (×2): qty 1

## 2011-04-09 MED ORDER — BISACODYL 10 MG RE SUPP
10.0000 mg | Freq: Every day | RECTAL | Status: DC | PRN
  Administered 2011-04-09: 10 mg via RECTAL
  Filled 2011-04-09: qty 1

## 2011-04-09 MED ORDER — FENTANYL CITRATE 0.05 MG/ML IJ SOLN
25.0000 ug | INTRAMUSCULAR | Status: DC | PRN
  Administered 2011-04-09: 25 ug via INTRAVENOUS

## 2011-04-09 MED ORDER — DOCUSATE SODIUM 100 MG PO CAPS: 100.0000 mg | ORAL_CAPSULE | Freq: Two times a day (BID) | ORAL | Status: DC

## 2011-04-09 MED ORDER — HYOSCYAMINE SULFATE 0.125 MG SL SUBL
0.1250 mg | SUBLINGUAL_TABLET | SUBLINGUAL | Status: DC | PRN
  Filled 2011-04-09: qty 1

## 2011-04-09 MED ORDER — DIPHENHYDRAMINE HCL 50 MG/ML IJ SOLN
25.0000 mg | Freq: Once | INTRAMUSCULAR | Status: AC
  Administered 2011-04-09: 25 mg via INTRAVENOUS

## 2011-04-09 MED ORDER — NITROGLYCERIN 0.4 MG SL SUBL: 0.4000 mg | SUBLINGUAL_TABLET | SUBLINGUAL | Status: DC | PRN

## 2011-04-09 MED ORDER — LACTATED RINGERS IV SOLN
INTRAVENOUS | Status: DC
  Administered 2011-04-09: 10:00:00 via INTRAVENOUS

## 2011-04-09 MED ORDER — HYDROCODONE-ACETAMINOPHEN 5-325 MG PO TABS
1.0000 | ORAL_TABLET | ORAL | Status: DC | PRN
  Administered 2011-04-09: 1 via ORAL
  Filled 2011-04-09: qty 1

## 2011-04-09 MED ORDER — POLYETHYL GLYCOL-PROPYL GLYCOL 0.4-0.3 % OP SOLN: 1.0000 [drp] | Freq: Two times a day (BID) | OPHTHALMIC | Status: DC

## 2011-04-09 MED ORDER — MORPHINE SULFATE 2 MG/ML IJ SOLN: 1.0000 mg | INTRAMUSCULAR | Status: DC | PRN

## 2011-04-09 MED ORDER — ACETAMINOPHEN 325 MG PO TABS: 650.0000 mg | ORAL_TABLET | ORAL | Status: DC | PRN

## 2011-04-09 MED ORDER — POLYETHYLENE GLYCOL 3350 17 G PO PACK
17.0000 g | PACK | Freq: Every day | ORAL | Status: DC
  Administered 2011-04-09 – 2011-04-11 (×3): 17 g via ORAL
  Filled 2011-04-09 (×3): qty 1

## 2011-04-09 MED ORDER — EPHEDRINE SULFATE 50 MG/ML IJ SOLN
INTRAMUSCULAR | Status: DC | PRN
  Administered 2011-04-09 (×3): 10 mg via INTRAVENOUS

## 2011-04-09 SURGICAL SUPPLY — 24 items
BAG URINE DRAINAGE (UROLOGICAL SUPPLIES) IMPLANT
BAG URO CATCHER STRL LF (DRAPE) ×2 IMPLANT
BLADE SURG 15 STRL LF DISP TIS (BLADE) IMPLANT
BLADE SURG 15 STRL SS (BLADE)
CATH FOLEY 3WAY 30CC 22FR (CATHETERS) ×1 IMPLANT
CLOTH BEACON ORANGE TIMEOUT ST (SAFETY) ×2 IMPLANT
DRAPE CAMERA CLOSED 9X96 (DRAPES) ×2 IMPLANT
ELECT BUTTON HF 24-28F 2 30DE (ELECTRODE) ×1 IMPLANT
ELECT HF RESECT BIPO 24F 45 ND (CUTTING LOOP) ×1 IMPLANT
ELECT LOOP MED HF 24F 12D (CUTTING LOOP) ×2 IMPLANT
ELECT REM PT RETURN 9FT ADLT (ELECTROSURGICAL)
ELECTRODE REM PT RTRN 9FT ADLT (ELECTROSURGICAL) ×1 IMPLANT
GLOVE SURG SS PI 8.0 STRL IVOR (GLOVE) ×2 IMPLANT
GOWN PREVENTION PLUS XLARGE (GOWN DISPOSABLE) ×2 IMPLANT
GOWN STRL REIN XL XLG (GOWN DISPOSABLE) ×2 IMPLANT
HOLDER FOLEY CATH W/STRAP (MISCELLANEOUS) ×1 IMPLANT
IV NS IRRIG 3000ML ARTHROMATIC (IV SOLUTION) ×9 IMPLANT
KIT ASPIRATION TUBING (SET/KITS/TRAYS/PACK) ×2 IMPLANT
MANIFOLD NEPTUNE II (INSTRUMENTS) ×2 IMPLANT
PACK CYSTO (CUSTOM PROCEDURE TRAY) ×2 IMPLANT
SUT ETHILON 3 0 PS 1 (SUTURE) IMPLANT
SYR 30ML LL (SYRINGE) ×1 IMPLANT
SYRINGE IRR TOOMEY STRL 70CC (SYRINGE) ×1 IMPLANT
TUBING CONNECTING 10 (TUBING) ×2 IMPLANT

## 2011-04-09 NOTE — Anesthesia Preprocedure Evaluation (Signed)
Anesthesia Evaluation  Patient identified by MRN, date of birth, ID band Patient awake    Reviewed: Allergy & Precautions, H&P , NPO status , Patient's Chart, lab work & pertinent test results  Airway Mallampati: II TM Distance: >3 FB Neck ROM: Full    Dental No notable dental hx.    Pulmonary neg pulmonary ROS,  clear to auscultation  Pulmonary exam normal       Cardiovascular hypertension, Pt. on home beta blockers + CAD + Valvular Problems/Murmurs Regular Normal    Neuro/Psych Negative Neurological ROS  Negative Psych ROS   GI/Hepatic negative GI ROS, Neg liver ROS,   Endo/Other  Negative Endocrine ROS  Renal/GU negative Renal ROS  Genitourinary negative   Musculoskeletal negative musculoskeletal ROS (+)   Abdominal   Peds negative pediatric ROS (+)  Hematology negative hematology ROS (+)   Anesthesia Other Findings   Reproductive/Obstetrics negative OB ROS                           Anesthesia Physical Anesthesia Plan  ASA: III  Anesthesia Plan: General   Post-op Pain Management:    Induction: Intravenous  Airway Management Planned: LMA  Additional Equipment:   Intra-op Plan:   Post-operative Plan: Extubation in OR  Informed Consent: I have reviewed the patients History and Physical, chart, labs and discussed the procedure including the risks, benefits and alternatives for the proposed anesthesia with the patient or authorized representative who has indicated his/her understanding and acceptance.   Dental advisory given  Plan Discussed with: CRNA  Anesthesia Plan Comments:         Anesthesia Quick Evaluation

## 2011-04-09 NOTE — Preoperative (Signed)
Beta Blockers   Reason not to administer Beta Blockers:Not Applicable 

## 2011-04-09 NOTE — Transfer of Care (Signed)
Immediate Anesthesia Transfer of Care Note  Patient: Jason Velazquez  Procedure(s) Performed: Procedure(s) (LRB): TRANSURETHRAL RESECTION OF THE PROSTATE WITH GYRUS INSTRUMENTS (N/A)  Patient Location: PACU  Anesthesia Type: General  Level of Consciousness: awake, alert  and oriented  Airway & Oxygen Therapy: Patient Spontanous Breathing and Patient connected to face mask oxygen  Post-op Assessment: Report given to PACU RN and Post -op Vital signs reviewed and stable  Post vital signs: Reviewed and stable  Complications: No apparent anesthesia complications

## 2011-04-09 NOTE — Interval H&P Note (Signed)
History and Physical Interval Note:  04/09/2011 7:09 AM  Jason Velazquez  has presented today for surgery, with the diagnosis of benign prostatic hypertrophy with bladder outlet obstruction   The various methods of treatment have been discussed with the patient and family. After consideration of risks, benefits and other options for treatment, the patient has consented to  Procedure(s) (LRB): TRANSURETHRAL RESECTION OF THE PROSTATE WITH GYRUS INSTRUMENTS (N/A) as a surgical intervention .  The patients' history has been reviewed, patient examined, no change in status, stable for surgery.  I have reviewed the patients' chart and labs.  Questions were answered to the patient's satisfaction.     Sherill Wegener J

## 2011-04-09 NOTE — Discharge Instructions (Signed)
Transurethral Resection of the Prostate Care After Refer to this sheet in the next few weeks. These discharge instructions provide you with general information on caring for yourself after you leave the hospital. Your caregiver may also give you specific instructions. Your treatment has been planned according to the most current medical practices available, but unavoidable complications sometimes occur. If you have any problems or questions after discharge, please call your caregiver. HOME CARE INSTRUCTIONS  Medications  You will receive medicine for pain management. As your level of discomfort decreases, adjustments in your pain medicines may be made.   Since it is important that you do deep breathing and coughing exercises and increase your activity, it may be helpful to take pain medicines prior to these activities. Take all medicines as directed.   You may be given a medicine (antibiotic) to kill germs following surgery. Finish all medicines. Let your caregiver know if you have any side effects or problems from the medicine.  Hygiene  You can take a shower after surgery.   You should not take a bath while you still have the urethral catheter.  Activity  You will be encouraged to get out of bed as much as possible and increase your activity level as tolerated.   Spend the first week in and around your home. For 10 days, avoid the following:   Straining.   Running.   Strenuous work.   Walks longer than a few blocks.   Riding for extended periods.   Sexual relations.   Do not lift heavy objects (more than 5 pounds/2.25 kilograms) for at least 1 month. When lifting, use your arms instead of your abdominal muscles.   You will be encouraged to walk as tolerated. Do not exert yourself. Increase your activity level slowly. Remember that it is important to keep moving after an operation of any type. This cuts down on the possibility of developing blood clots.   Your caregiver will  tell you when you can resume driving and light housework. Discuss this at your first office visit after discharge.  Diet  No special diet is ordered after a TURP. However, if you are on a special diet for another medical problem, it should be continued.   Normal fluid intake is usually recommended.   Avoid alcohol and caffeinated drinks for 2 weeks. They irritate the bladder. Decaffeinated drinks are okay.   Avoid spicy foods.  Bladder Function  For the first 10 days, empty the bladder whenever you feel a definite desire. Do not try to hold the urine for long periods of time.   Urinating once or twice a night even after you are healed is not uncommon.   You may see some recurrence of blood in the urine after discharge from the hospital. If this occurs, force fluids again as you did in the hospital and reduce your activity.  Bowel Function  You may experience some constipation after surgery. This can be minimized by increasing fluids and fiber in your diet. Drink enough water and fluids to keep your urine clear or pale yellow.   A stool softener may be prescribed for use at home. Do not strain to move your bowels.   If you are requiring increased pain medicine, it is important that you take stool softeners to prevent constipation. This will help to promote proper healing by reducing the need to strain to move your bowels.  Sexual Activity  Semen movement in the opposite direction and into the bladder (retrograde ejaculation) may   occur. Since the semen passes into the bladder, cloudy urine can occur the first time you urinate after intercourse. Or, you may not have an ejaculation during erection. Ask your caregiver when you can resume sexual activity. Retrograde ejaculation and reduced semen discharge should not reduce one's pleasure of intercourse.  Postoperative Visit  Arrange the date and time of your after surgery visit with your caregiver.  Return to Work  After your recovery is  complete, you will be able to return to work and resume all activities. Your caregiver will inform you when you can return to work.  Foley Catheter Care A soft, flexible tube (Foley catheter) has been placed in your bladder to drain urine and fluid. Follow these instructions: Taking Care of the Catheter  Keep the area where the catheter leaves your body clean.   Attach the catheter to the leg so there is no tension on the catheter.   Keep the drainage bag below the level of the bladder, but keep it OFF the floor.   Do not take long soaking baths. Your caregiver will give instructions about showering.   Wash your hands before touching ANYTHING related to the catheter or bag.   Using mild soap and warm water on a washcloth:   Clean the area closest to the catheter insertion site using a circular motion around the catheter.   Clean the catheter itself by wiping AWAY from the insertion site for several inches down the tube.   NEVER wipe upward as this could sweep bacteria up into the urethra (tube in your body that normally drains the bladder) and cause infection.   Place a small amount of sterile lubricant at the tip of the penis where the catheter is entering.  Taking Care of the Drainage Bags  Two drainage bags will be taken home: a large overnight drainage bag, and a smaller leg bag which fits underneath clothing.   It is okay to wear the overnight bag at any time, but NEVER wear the smaller leg bag at night.   Keep the drainage bag well below the level of your bladder. This prevents backflow of urine into the bladder and allows the urine to drain freely.   Anchor the tubing to your leg to prevent pulling or tension on the catheter. Use tape or a leg strap provided by the hospital.   Empty the drainage bag when it is 1/2 to 3/4 full. Wash your hands before and after touching the bag.   Periodically check the tubing for kinks to make sure there is no pressure on the tubing which  could restrict the flow of urine.  Changing the Drainage Bags  Cleanse both ends of the clean bag with alcohol before changing.   Pinch off the rubber catheter to avoid urine spillage during the disconnection.   Disconnect the dirty bag and connect the clean one.   Empty the dirty bag carefully to avoid a urine spill.   Attach the new bag to the leg with tape or a leg strap.  Cleaning the Drainage Bags  Whenever a drainage bag is disconnected, it must be cleaned quickly so it is ready for the next use.   Wash the bag in warm, soapy water.   Rinse the bag thoroughly with warm water.   Soak the bag for 30 minutes in a solution of white vinegar and water (1 cup vinegar to 1 quart warm water).   Rinse with warm water.  SEEK MEDICAL CARE IF:     You have chills or night sweats.   You are leaking around your catheter or have problems with your catheter. It is not uncommon to have sporadic leakage around your catheter as a result of bladder spasms. If the leakage stops, there is not much need for concern. If you are uncertain, call your caregiver.   You develop side effects that you think are coming from your medicines.  SEEK IMMEDIATE MEDICAL CARE IF:   You are suddenly unable to urinate. Check to see if there are any kinks in the drainage tubing that may cause this. If you cannot find any kinks, call your caregiver immediately. This is an emergency.   You develop shortness of breath or chest pains.   Bleeding persists or clots develop in your urine.   You have a fever.   You develop pain in your back or over your lower belly (abdomen).   You develop pain or swelling in your legs.   Any problems you are having get worse rather than better.  MAKE SURE YOU:   Understand these instructions.   Will watch your condition.   Will get help right away if you are not doing well or get worse.  Document Released: 01/28/2005 Document Revised: 10/10/2010 Document Reviewed:  09/21/2008 [facility] Patient Information 2012 [place], [place].

## 2011-04-09 NOTE — Anesthesia Postprocedure Evaluation (Signed)
  Anesthesia Post-op Note  Patient: Jason Velazquez  Procedure(s) Performed: Procedure(s) (LRB): TRANSURETHRAL RESECTION OF THE PROSTATE WITH GYRUS INSTRUMENTS (N/A)  Patient Location: PACU  Anesthesia Type: General  Level of Consciousness: awake and alert   Airway and Oxygen Therapy: Patient Spontanous Breathing  Post-op Pain: mild  Post-op Assessment: Post-op Vital signs reviewed, Patient's Cardiovascular Status Stable, Respiratory Function Stable, Patent Airway and No signs of Nausea or vomiting  Post-op Vital Signs: stable  Complications: No apparent anesthesia complications

## 2011-04-09 NOTE — Brief Op Note (Signed)
04/09/2011  8:12 AM  PATIENT:  Jason Velazquez  78 y.o. male  PRE-OPERATIVE DIAGNOSIS:  benign prostatic hypertrophy with bladder outlet obstruction   POST-OPERATIVE DIAGNOSIS:  benign prostatic hypertrophy with bladder outlet obstruction   PROCEDURE:  Procedure(s) (LRB): TRANSURETHRAL RESECTION OF THE PROSTATE WITH GYRUS INSTRUMENTS (N/A)  SURGEON:  Surgeon(s) and Role:    * Anner Crete, MD - Primary  PHYSICIAN ASSISTANT:   ASSISTANTS: none   ANESTHESIA:   general  EBL:     BLOOD ADMINISTERED:none  DRAINS: Urinary Catheter (Foley)   LOCAL MEDICATIONS USED:  NONE  SPECIMEN:  Source of Specimen:  Prostate chips  DISPOSITION OF SPECIMEN:  PATHOLOGY  COUNTS:  YES  TOURNIQUET:  * No tourniquets in log *  DICTATION: .Other Dictation: Dictation Number [id number]  PLAN OF CARE: Admit for overnight observation  PATIENT DISPOSITION:  PACU - hemodynamically stable.   Delay start of Pharmacological VTE agent (>24hrs) due to surgical blood loss or risk of bleeding: yes

## 2011-04-10 MED ORDER — ALUM & MAG HYDROXIDE-SIMETH 200-200-20 MG/5ML PO SUSP: 30.0000 mL | Freq: Two times a day (BID) | ORAL | Status: DC | PRN

## 2011-04-10 NOTE — Op Note (Signed)
[name], [name]:  [id number]  MEDICAL RECORD NO.:  [id number]  LOCATION:  1405                         FACILITY:  [facility]  PHYSICIAN:  Excell Seltzer. Annabell Howells, M.D.    DATE OF BIRTH:  [birth year]  DATE OF PROCEDURE:  04/09/2011 DATE OF DISCHARGE:                              OPERATIVE REPORT   PROCEDURE:  Transurethral resection of the prostate.  PREOPERATIVE DIAGNOSIS:  Benign prostatic hypertrophy with bladder outlet obstruction.  POSTOPERATIVE DIAGNOSIS:  Benign prostatic hypertrophy with bladder outlet obstruction.  SURGEON:  Excell Seltzer. Annabell Howells, M.D.  ANESTHESIA:  General.  SPECIMEN:  Prostate chips.  ESTIMATED BLOOD LOSS:  Approximately 200 cc.  DRAINS:  22-French 3-way Foley catheter.  COMPLICATIONS:  None.  INDICATIONS:  [name] is an 78 year old white male with BPH and bladder outlet obstruction.  He has elected to undergo TURP.  FINDINGS OF PROCEDURE:  He was taken to the operating room and he was given Cipro.  A general anesthetic was induced.  He was placed in lithotomy position.  He was fitted with PAS hose.  His genitalia was prepped with Hibiclens.  He was draped in usual sterile fashion.  Time- out was performed.  Cystoscopy was performed using a 22-French scope and 12 and 70-degree lenses.  Examination revealed normal urethra.  The external sphincter was intact.  The prostatic urethra had bilobar hyperplasia with asymmetric enlargement of the right lobe and high bladder neck  without an intravesical middle lobe.  Examination of the bladder revealed moderate severe trabeculation.  The ureteral orifices were unremarkable. No mucosal lesions were noted.  Urethra was then calibrated to 30-French with Sissy Hoff sounds and a 28- French continuous flow resectoscope sheath was inserted.  This was fitted with an [place] handle with a bipolar loop and 12-degree lens, with saline as the irrigant.  The resection was initiated at the bladder  neck with exposure of the fibers from 5-7 o'clock.  The floor of the prostate was then resected out to alongside the verumontanum.  The right lobe of the prostate was resected from bladder neck to apex out to the capsular fibers.  This was followed by resection of the left lobe.  The bladder was evacuated free of chips ans initial hemostasis was achieved.  Residual apical and anterior tissue was then resected as required.  Final removal of chips was performed.  Final inspection for hemostasis was performed with cauterization of oozing vessels.  Inspection of the bladder revealed no retained chips.  Intact ureteral orifices and intact external sphincter. Upon removal of the resectoscope, pressure on the bladder produced a good stream.  A 22-French 3-way Foley catheter was then inserted with the aid of a catheter guide.  The balloon was filled with 30 cc of sterile fluid and the bladder was drained and then irrigated with clear return.  He was placed to continuous irrigation and straight drainage. He was taken down from lithotomy position.  The catheter was secured to his left thigh.  His anesthetic was reversed.  He was moved to recovery room in stable condition.  There were no complications.     Excell Seltzer. Annabell Howells, M.D.     JJW/MEDQ  D:  04/09/2011  T:  04/10/2011  Job:  [id number]

## 2011-04-10 NOTE — Progress Notes (Signed)
Patient ID: Jason Velazquez, male   DOB: 1928-09-12, 78 y.o.   MRN: [id number] 1 Day Post-Op  Subjective: Jason Velazquez is doing well this morning and his urine is light pink.  He had a reaction to niaspan last night but has recovered well.  He had some clots after straining at stool last night but the urine is light pink on slow irrigation this morning. ROS: Negative except as noted above.  He has no fever, no flushing and no bladder pain.  Objective: Vital signs in last 24 hours: Temp:  [96.7 F (35.9 C)-98.7 F (37.1 C)] 98.2 F (36.8 C) (02/27 0512) Pulse Rate:  [56-106] 106  (02/27 0512) Resp:  [14-20] 20  (02/27 0512) BP: (110-156)/(56-86) 110/56 mmHg (02/27 0512) SpO2:  [58 %-100 %] 93 % (02/27 0512) Weight:  [75.66 kg (166 lb 12.8 oz)] 75.66 kg (166 lb 12.8 oz) (02/26 1034)  Intake/Output from previous day: 02/26 0701 - 02/27 0700 In: 2785 [P.O.:480; I.V.:2305] Out: 40981 [Urine:13775] Intake/Output this shift:    General appearance: alert and no distress Resp: clear to auscultation bilaterally Cardio: regular rate and rhythm  Lab Results:  No results found for this basename: WBC:2,HGB:2,HCT:2,PLT:2 in the last 72 hours BMET No results found for this basename: NA:2,K:2,CL:2,CO2:2,GLUCOSE:2,BUN:2,CREATININE:2,CALCIUM:2 in the last 72 hours PT/INR No results found for this basename: LABPROT:2,INR:2 in the last 72 hours ABG No results found for this basename: PHART:2,PCO2:2,PO2:2,HCO3:2 in the last 72 hours  Studies/Results: No results found.  Anti-infectives: Anti-infectives     Start     Dose/Rate Route Frequency Ordered Stop   04/09/11 1200   ciprofloxacin (CIPRO) tablet 250 mg        250 mg Oral 2 times daily 04/09/11 1031     04/09/11 0629   ciprofloxacin (CIPRO) IVPB 400 mg        400 mg 200 mL/hr over 60 Minutes Intravenous 60 min pre-op 04/09/11 1914 04/09/11 7829          Current Facility-Administered Medications  Medication Dose Route Frequency  Provider Last Rate Last Dose  . 0.45 % NaCl with KCl 20 mEq / L infusion   Intravenous Continuous Anner Crete, MD 100 mL/hr at 04/10/11 0153    . acetaminophen (TYLENOL) tablet 650 mg  650 mg Oral Q4H PRN Anner Crete, MD      . alum & mag hydroxide-simeth (MAALOX/MYLANTA) 200-200-20 MG/5ML suspension 30 mL  30 mL Oral BID PRN Jetta Lout, NP      . atenolol (TENORMIN) tablet 25 mg  25 mg Oral QPC breakfast Anner Crete, MD      . bisacodyl (DULCOLAX) suppository 10 mg  10 mg Rectal Daily PRN Anner Crete, MD   10 mg at 04/09/11 2145  . ciprofloxacin (CIPRO) tablet 250 mg  250 mg Oral BID Anner Crete, MD   250 mg at 04/09/11 2013  . diphenhydrAMINE (BENADRYL) injection 25 mg  25 mg Intravenous Q6H PRN Marcine Matar, MD   25 mg at 04/09/11 1407  . diphenhydrAMINE (BENADRYL) injection 25 mg  25 mg Intravenous Once Marcine Matar, MD   25 mg at 04/09/11 1429  . docusate sodium (COLACE) capsule 100 mg  100 mg Oral BID Anner Crete, MD   100 mg at 04/09/11 2144  . HYDROcodone-acetaminophen (NORCO) 5-325 MG per tablet 1-2 tablet  1-2 tablet Oral Q4H PRN Anner Crete, MD   1 tablet at 04/09/11 1916  . hyoscyamine (LEVSIN SL) SL tablet 0.125 mg  0.125 mg Oral Q4H PRN Anner Crete, MD      . morphine 2 MG/ML injection 1 mg  1 mg Intravenous Q2H PRN Anner Crete, MD      . mulitivitamin with minerals tablet 1 tablet  1 tablet Oral Daily Anner Crete, MD   1 tablet at 04/09/11 1234  . nitroGLYCERIN (NITROSTAT) SL tablet 0.4 mg  0.4 mg Sublingual Q5 min PRN Anner Crete, MD      . ondansetron [facility]) injection 4 mg  4 mg Intravenous Q4H PRN Anner Crete, MD      . polyethylene glycol (MIRALAX / GLYCOLAX) packet 17 g  17 g Oral Daily Anner Crete, MD   17 g at 04/09/11 1233  . polyvinyl alcohol (LIQUIFILM TEARS) 1.4 % ophthalmic solution 1 drop  1 drop Both Eyes BID Anner Crete, MD   1 drop at 04/09/11 2143  . simvastatin (ZOCOR) tablet 20 mg  20 mg Oral Daily Anner Crete, MD   20 mg at 04/09/11  1234  . sodium chloride (OCEAN) 0.65 % nasal spray 1-4 spray  1-4 spray Each Nare BID Anner Crete, MD   4 spray at 04/09/11 2147  . terazosin (HYTRIN) capsule 5 mg  5 mg Oral QHS Anner Crete, MD   5 mg at 04/09/11 2144  . zolpidem (AMBIEN) tablet 5 mg  5 mg Oral QHS PRN Anner Crete, MD      . DISCONTD: 0.9 % irrigation (POUR BTL)    PRN Anner Crete, MD   1,000 mL at 04/09/11 0810  . DISCONTD: docusate sodium (COLACE) capsule 100 mg  100 mg Oral BID Anner Crete, MD      . DISCONTD: fentaNYL (SUBLIMAZE) injection 25-50 mcg  25-50 mcg Intravenous Q5 min PRN Azell Der, MD   25 mcg at 04/09/11 0911  . DISCONTD: lactated ringers infusion   Intravenous Continuous Diana L. Reardon, CRNA 999 mL/hr at 04/09/11 0945    . DISCONTD: niacin tablet 1,000 mg  1,000 mg Oral QPC breakfast Anner Crete, MD   1,000 mg at 04/09/11 1233  . DISCONTD: Polyethyl Glycol-Propyl Glycol 0.4-0.3 % SOLN 1 drop  1 drop Ophthalmic BID Anner Crete, MD      . DISCONTD: sodium chloride (OCEAN) 0.65 % nasal spray 1-4 spray  1-4 spray Nasal BID Anner Crete, MD      . DISCONTD: sodium chloride irrigation 0.9 %    PRN Anner Crete, MD   11,000 mL at 04/09/11 0741   Facility-Administered Medications Ordered in Other Encounters  Medication Dose Route Frequency Provider Last Rate Last Dose  . DISCONTD: acetaminophen (OFIRMEV) IV    PRN Diana L. Lurlean Leyden, CRNA   1,000 mg at 04/09/11 0657  . DISCONTD: ePHEDrine injection    PRN Diana L. Lurlean Leyden, CRNA   10 mg at 04/09/11 0804  . DISCONTD: fentaNYL (SUBLIMAZE) injection    PRN Diana L. Lurlean Leyden, CRNA   25 mcg at 04/09/11 0749  . DISCONTD: lactated ringers infusion    Continuous PRN Diana L. Lurlean Leyden, Scientist, clinical (histocompatibility and immunogenetics)      . DISCONTD: lidocaine (cardiac) 100 mg/12ml (XYLOCAINE) 20 MG/ML injection 2%    PRN Diana L. Lurlean Leyden, CRNA   100 mg at 04/09/11 0719  . DISCONTD: ondansetron (ZOFRAN) injection    PRN Diana L. Lurlean Leyden, CRNA   4 mg at 04/09/11 0805  . DISCONTD: propofol (DIPRIVAN) 10 MG/ML  infusion  PRN Diana L. Lurlean Leyden, CRNA   200 mg at 04/09/11 0454    Assessment: s/p Procedure(s): TRANSURETHRAL RESECTION OF THE PROSTATE WITH GYRUS INSTRUMENTS  His urine is clearing after having some clots last night.  He had a reaction to Niaspan.  Plan: I will d/c the CBI. Foley out in AM if urine clear. I will stop the niaspan.   LOS: 1 day    Jayshaun Phillips J 04/10/2011

## 2011-04-11 ENCOUNTER — Encounter (HOSPITAL_COMMUNITY): Payer: Self-pay | Admitting: Urology

## 2011-04-11 NOTE — Discharge Summary (Signed)
Physician Discharge Summary  Patient ID: Calel Pisarski MRN: [id number] DOB/AGE: 76-May-1930 76 y.o.  Admit date: 04/09/2011 Discharge date: 04/11/2011  Admission Diagnoses:  BPH with BOO  Discharge Diagnoses: BPH with BOO Principal Problem:  *BPH (benign prostatic hypertrophy) with urinary obstruction   Discharged Condition: good  Hospital Course: Mr. Vincent had a TURP on 2/26.  His urine was somewhat bloody with some clots on 2/27.  He had some clot passage last night but the urine is now almost clear off of CBI and he will have the foley removed this morning with discharge home when    Treatments: surgery: TURP  Discharge Exam: Blood pressure 143/76, pulse 72, temperature 97.9 F (36.6 C), temperature source Oral, resp. rate 18, height 5\' 6"  (1.676 m), weight 75.66 kg (166 lb 12.8 oz), SpO2 96.00%. General appearance: alert and no distress  Disposition: Home when voiding.  He will hold his ASA for a week.   Medication List  As of 04/11/2011  9:00 AM   TAKE these medications         aspirin 325 MG EC tablet   Take 325 mg by mouth daily after breakfast.      atenolol 25 MG tablet   Commonly known as: TENORMIN   Take 25 mg by mouth daily after breakfast.      atorvastatin 20 MG tablet   Commonly known as: LIPITOR   Take 20 mg by mouth at bedtime.      docusate sodium 100 MG capsule   Commonly known as: COLACE   Take 100 mg by mouth 2 (two) times daily.      mulitivitamin with minerals Tabs   Take 1 tablet by mouth daily.      niacin 500 MG tablet   Take 1,000 mg by mouth daily after breakfast.      nitroGLYCERIN 0.4 MG SL tablet   Commonly known as: NITROSTAT   Place 0.4 mg under the tongue every 5 (five) minutes as needed.      polyethylene glycol packet   Commonly known as: MIRALAX / GLYCOLAX   Take 17 g by mouth daily.      sodium chloride 0.65 % nasal spray   Commonly known as: OCEAN   Place 1-4 sprays into the nose 2 (two) times daily.      SYSTANE  ULTRA PF 0.4-0.3 % Soln   Generic drug: Polyethyl Glycol-Propyl Glycol   Apply to eye 2 (two) times daily.      terazosin 5 MG capsule   Commonly known as: HYTRIN   Take 5 mg by mouth at bedtime.           Follow-up Information    Follow up with Anner Crete, MD. (call office for an appt with me or the nurse practicioner for 2-3 weeks.)    Contact information:   [address] [place] [address] [place] [address] [phone]          Signed: Anner Crete 04/11/2011, 9:00 AM

## 2011-04-11 NOTE — Progress Notes (Signed)
Order to d/c Foley if urine light pink/clear. Pt's urine still red/bloody with small clots. Foley not d/c'ed at this time. Will continue to monitor.  Daphine Deutscher, Miranda [place]

## 2011-04-11 NOTE — Progress Notes (Signed)
Pt. Foley catheter was d/c and a string of bottles was started.  1st void yielded 250cc of dark bloody urine with a moderate amount of clots.  2nd void yielded 200cc of red/clear urine with no clots.  3rd void yielded 225 cc with red/clear/slightly lighter urine without any clots present.  Patient wanted to do another bottle after these three since the second and third were close in color.  4th bottle revealed 300cc of red/clear/much lighter urine.  Patient felt comfortable being discharged. Went over d/c paperwork and medications with patient.  Instructed patient on when to call the doctor. Questions were answered for patient and wife.  Evyn Putzier, Joslyn [place]

## 2011-04-24 DIAGNOSIS — Z1331 Encounter for screening for depression: Secondary | ICD-10-CM | POA: Diagnosis not present

## 2011-04-24 DIAGNOSIS — Z79899 Other long term (current) drug therapy: Secondary | ICD-10-CM | POA: Diagnosis not present

## 2011-04-24 DIAGNOSIS — Z0000 Encounter for general adult medical examination without abnormal findings: Secondary | ICD-10-CM | POA: Diagnosis not present

## 2011-04-24 DIAGNOSIS — E7800 Pure hypercholesterolemia, unspecified: Secondary | ICD-10-CM | POA: Diagnosis not present

## 2011-04-24 DIAGNOSIS — I10 Essential (primary) hypertension: Secondary | ICD-10-CM | POA: Diagnosis not present

## 2011-04-25 DIAGNOSIS — N401 Enlarged prostate with lower urinary tract symptoms: Secondary | ICD-10-CM | POA: Diagnosis not present

## 2011-04-25 DIAGNOSIS — N138 Other obstructive and reflux uropathy: Secondary | ICD-10-CM | POA: Diagnosis not present

## 2011-05-15 DIAGNOSIS — H04219 Epiphora due to excess lacrimation, unspecified lacrimal gland: Secondary | ICD-10-CM | POA: Diagnosis not present

## 2011-06-18 DIAGNOSIS — L821 Other seborrheic keratosis: Secondary | ICD-10-CM | POA: Diagnosis not present

## 2011-06-18 DIAGNOSIS — L570 Actinic keratosis: Secondary | ICD-10-CM | POA: Diagnosis not present

## 2011-07-29 DIAGNOSIS — N138 Other obstructive and reflux uropathy: Secondary | ICD-10-CM | POA: Diagnosis not present

## 2011-07-29 DIAGNOSIS — N3941 Urge incontinence: Secondary | ICD-10-CM | POA: Diagnosis not present

## 2011-07-29 DIAGNOSIS — N401 Enlarged prostate with lower urinary tract symptoms: Secondary | ICD-10-CM | POA: Diagnosis not present

## 2011-07-29 DIAGNOSIS — R339 Retention of urine, unspecified: Secondary | ICD-10-CM | POA: Diagnosis not present

## 2011-11-04 DIAGNOSIS — I10 Essential (primary) hypertension: Secondary | ICD-10-CM | POA: Diagnosis not present

## 2011-11-04 DIAGNOSIS — Z79899 Other long term (current) drug therapy: Secondary | ICD-10-CM | POA: Diagnosis not present

## 2011-11-04 DIAGNOSIS — N400 Enlarged prostate without lower urinary tract symptoms: Secondary | ICD-10-CM | POA: Diagnosis not present

## 2011-11-04 DIAGNOSIS — E7800 Pure hypercholesterolemia, unspecified: Secondary | ICD-10-CM | POA: Diagnosis not present

## 2011-11-05 DIAGNOSIS — E7800 Pure hypercholesterolemia, unspecified: Secondary | ICD-10-CM | POA: Diagnosis not present

## 2011-11-05 DIAGNOSIS — Z79899 Other long term (current) drug therapy: Secondary | ICD-10-CM | POA: Diagnosis not present

## 2011-12-09 DIAGNOSIS — Z23 Encounter for immunization: Secondary | ICD-10-CM | POA: Diagnosis not present

## 2011-12-25 DIAGNOSIS — H52209 Unspecified astigmatism, unspecified eye: Secondary | ICD-10-CM | POA: Diagnosis not present

## 2011-12-25 DIAGNOSIS — Z961 Presence of intraocular lens: Secondary | ICD-10-CM | POA: Diagnosis not present

## 2012-03-12 ENCOUNTER — Other Ambulatory Visit: Payer: Self-pay | Admitting: Dermatology

## 2012-03-12 DIAGNOSIS — D0439 Carcinoma in situ of skin of other parts of face: Secondary | ICD-10-CM | POA: Diagnosis not present

## 2012-03-12 DIAGNOSIS — D0430 Carcinoma in situ of skin of unspecified part of face: Secondary | ICD-10-CM | POA: Diagnosis not present

## 2012-03-12 DIAGNOSIS — D2330 Other benign neoplasm of skin of unspecified part of face: Secondary | ICD-10-CM | POA: Diagnosis not present

## 2012-03-12 DIAGNOSIS — L821 Other seborrheic keratosis: Secondary | ICD-10-CM | POA: Diagnosis not present

## 2012-03-12 DIAGNOSIS — L570 Actinic keratosis: Secondary | ICD-10-CM | POA: Diagnosis not present

## 2012-03-12 DIAGNOSIS — D485 Neoplasm of uncertain behavior of skin: Secondary | ICD-10-CM | POA: Diagnosis not present

## 2012-03-12 DIAGNOSIS — Z85820 Personal history of malignant melanoma of skin: Secondary | ICD-10-CM | POA: Diagnosis not present

## 2012-03-24 DIAGNOSIS — D0430 Carcinoma in situ of skin of unspecified part of face: Secondary | ICD-10-CM | POA: Diagnosis not present

## 2012-03-24 DIAGNOSIS — D0439 Carcinoma in situ of skin of other parts of face: Secondary | ICD-10-CM | POA: Diagnosis not present

## 2012-05-11 DIAGNOSIS — Z1331 Encounter for screening for depression: Secondary | ICD-10-CM | POA: Diagnosis not present

## 2012-05-11 DIAGNOSIS — E7800 Pure hypercholesterolemia, unspecified: Secondary | ICD-10-CM | POA: Diagnosis not present

## 2012-05-11 DIAGNOSIS — Z0000 Encounter for general adult medical examination without abnormal findings: Secondary | ICD-10-CM | POA: Diagnosis not present

## 2012-05-11 DIAGNOSIS — Z79899 Other long term (current) drug therapy: Secondary | ICD-10-CM | POA: Diagnosis not present

## 2012-06-08 DIAGNOSIS — E7800 Pure hypercholesterolemia, unspecified: Secondary | ICD-10-CM | POA: Diagnosis not present

## 2012-06-08 DIAGNOSIS — Z79899 Other long term (current) drug therapy: Secondary | ICD-10-CM | POA: Diagnosis not present

## 2012-06-10 DIAGNOSIS — I10 Essential (primary) hypertension: Secondary | ICD-10-CM | POA: Diagnosis not present

## 2012-06-10 DIAGNOSIS — K602 Anal fissure, unspecified: Secondary | ICD-10-CM | POA: Diagnosis not present

## 2012-06-16 DIAGNOSIS — I10 Essential (primary) hypertension: Secondary | ICD-10-CM | POA: Diagnosis not present

## 2012-06-29 DIAGNOSIS — H1045 Other chronic allergic conjunctivitis: Secondary | ICD-10-CM | POA: Diagnosis not present

## 2012-06-30 DIAGNOSIS — H9110 Presbycusis, unspecified ear: Secondary | ICD-10-CM | POA: Diagnosis not present

## 2012-06-30 DIAGNOSIS — J3489 Other specified disorders of nose and nasal sinuses: Secondary | ICD-10-CM | POA: Diagnosis not present

## 2012-06-30 DIAGNOSIS — H903 Sensorineural hearing loss, bilateral: Secondary | ICD-10-CM | POA: Diagnosis not present

## 2012-07-07 ENCOUNTER — Encounter (INDEPENDENT_AMBULATORY_CARE_PROVIDER_SITE_OTHER): Payer: Self-pay

## 2012-07-07 ENCOUNTER — Encounter (INDEPENDENT_AMBULATORY_CARE_PROVIDER_SITE_OTHER): Payer: Self-pay | Admitting: General Surgery

## 2012-07-07 ENCOUNTER — Ambulatory Visit (INDEPENDENT_AMBULATORY_CARE_PROVIDER_SITE_OTHER): Payer: Medicare Other | Admitting: General Surgery

## 2012-07-07 VITALS — BP 146/82 | HR 65 | Temp 97.0°F | Ht 66.0 in | Wt 166.6 lb

## 2012-07-07 DIAGNOSIS — K603 Anal fistula: Secondary | ICD-10-CM

## 2012-07-07 NOTE — Progress Notes (Signed)
Per Dr Maisie Fus he does not need to stop his Aspirin preop.

## 2012-07-07 NOTE — Progress Notes (Signed)
Chief Complaint  Patient presents with  . New Evaluation    eval rectal fissure    HISTORY: Jason Velazquez is a 79 y.o. male who presents to the office with anal pain.  This had been occurring for 4 weeks.  He has tried several creams in the past with no success.  Nothing makes the symptoms worse.   It is intermittent in nature.  His bowel habits are regular and his bowel movements are occasionally hard.  He has had trouble with constipation all of his life and currently takes miralax and colace daily.   His fiber intake is moderate.   He has had several surgeries to remove skin tags in the past.    Past Medical History  Diagnosis Date  . Hypertension   . Hypercholesteremia   . Heart murmur   . Coronary artery disease   . Blood transfusion 1988  . Benign prostatic hypertrophy   . Blood transfusion without reported diagnosis   . GERD (gastroesophageal reflux disease)   . Cancer     melanoma on face      Past Surgical History  Procedure Laterality Date  . Coronary stent placement      August 17, 1998, 1 stent done  . Stent placment  04-02-2004    x1  . Tonsillectomy  1944     t and a  . Coronary artery bypass graft  1988    x 4  . Right rotator cuff repair  07-10-1999  . Bilateral catarct extraction  jan and march 2012  . Transurethral resection of prostate  04/09/2011    Procedure: TRANSURETHRAL RESECTION OF THE PROSTATE WITH GYRUS INSTRUMENTS;  Surgeon: Anner Crete, MD;  Location: [facility];  Service: Urology;  Laterality: N/A;        Current Outpatient Prescriptions  Medication Sig Dispense Refill  . aspirin 325 MG EC tablet Take 325 mg by mouth daily after breakfast.      . atenolol (TENORMIN) 25 MG tablet Take 25 mg by mouth daily after breakfast.      . atorvastatin (LIPITOR) 20 MG tablet Take 20 mg by mouth at bedtime.      . docusate sodium (COLACE) 100 MG capsule Take 100 mg by mouth 2 (two) times daily.      . nitroGLYCERIN (NITROSTAT) 0.4 MG SL tablet Place 0.4 mg under  the tongue every 5 (five) minutes as needed.      . polyethylene glycol (MIRALAX / GLYCOLAX) packet Take 17 g by mouth daily.      . sodium chloride (OCEAN) 0.65 % nasal spray Place 1-4 sprays into the nose 2 (two) times daily.      . Multiple Vitamin (MULITIVITAMIN WITH MINERALS) TABS Take 1 tablet by mouth daily.       No current facility-administered medications for this visit.      Allergies  Allergen Reactions  . Niacin And Related     Burning and shaking all over....red skin  . Adhesive (Tape) Rash  . Iodine Rash    Rash when applied to skin      Family History  Problem Relation Age of Onset  . Cancer Mother     breast  . Cancer Brother     colon    History   Social History  . Marital Status: Married    Spouse Name: N/A    Number of Children: N/A  . Years of Education: N/A   Social History Main Topics  . Smoking status:  Never Smoker   . Smokeless tobacco: Never Used  . Alcohol Use: No  . Drug Use: No  . Sexually Active: None   Other Topics Concern  . None   Social History Narrative  . None      REVIEW OF SYSTEMS - PERTINENT POSITIVES ONLY: Review of Systems - General ROS: negative for - chills, fever or weight loss Hematological and Lymphatic ROS: negative for - bleeding problems, blood clots or bruising Respiratory ROS: no cough, shortness of breath, or wheezing Cardiovascular ROS: no chest pain or dyspnea on exertion Gastrointestinal ROS: no abdominal pain, change in bowel habits, or black or bloody stools Genito-Urinary ROS: no dysuria, trouble voiding, or hematuria  EXAM: Filed Vitals:   07/07/12 1027  BP: 146/82  Pulse: 65  Temp: 97 F (36.1 C)    General appearance: alert and cooperative Resp: clear to auscultation bilaterally Cardio: regular rate and rhythm GI: soft, non-tender; bowel sounds normal; no masses,  no organomegaly   Procedure: Anoscopy Surgeon: Maisie Fus Diagnosis: anal pain  Assistant: Christella Scheuermann After the risks and  benefits were explained, verbal consent was obtained for above procedure  Anesthesia: none Findings: posterior anal fistula, slight anal stenosis, grade 2 internal hemorrhoids    ASSESSMENT AND PLAN: Jason Velazquez is a 79 y.o. M with chronic constipation and anal pain.  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  The distrubution pattern on his skin is consistent with irritation from his draining fistula.  I think there is a reasonable chance we can resolve these issues with a simple fistulotomy.  I think the major risks with be bleeding, infection and recurrence.  I think that all of these risks are low.      Vanita Panda, MD Colon and Rectal Surgery / General Surgery [facility]      Visit Diagnoses: No diagnosis found.  Primary Care Physician: Ginette Otto, MD

## 2012-07-07 NOTE — Patient Instructions (Signed)
WHAT IS AN ANAL FISSURE? An anal fissure (fissure-in-ano) is a small, oval shaped tear in skin that lines the opening of the anus. Fissures typically cause severe pain and bleeding with bowel movements. Fissures are quite common in the general population, but are often confused with other causes of pain and bleeding, such as hemorrhoids. WHAT ARE THE SYMPTOMS OF AN ANAL FISSURE? The typical symptoms of an anal fissure include severe pain during, and especially after, a bowel movement, lasting from several minutes to a few hours. Patients may also notice bright red blood from the anus that can be seen on the toilet paper or on the stool. Between bowel movements, patients with anal fissures are often relatively symptom-free. Many patients are fearful of having a bowel movement and may try to avoid defecation secondary to the pain.  WHAT CAUSES AN ANAL FISSURE? Fissures are usually caused by trauma to the inner lining of the anus. Patients with tight anal sphincter muscles (i.e., increased muscle tone) are more prone to developing anal fissures. A hard, dry bowel movement is typically responsible, but loose stools and diarrhea can also be the cause. Following a bowel movement, severe anal pain can produce spasm of the anal sphincter muscle, resulting in a decrease in blood flow to the site of the injury, thus impairing healing of the wound. The next bowel movement results in more pain, anal spasm, decreased blood flow to the area, and the cycle continues. Treatments are aimed at interrupting this cycle by relaxing the anal sphincter muscle to promote healing of the fissure.  Other, less common, causes include inflammatory conditions and certain anal infections or tumors. Anal fissures may be acute (recent onset) or chronic (present for a long period of time). Chronic fissures may be more difficult to treat, and may also have an external lump associated with the tear, called a sentinel pile or skin tag, as well as  extra tissue just inside the anal canal (hypertrophied papilla) . WHAT IS THE TREATMENT OF ANAL FISSURES? The majority of anal fissures do not require surgery. The most common treatment for an acute anal fissure consists of making the stool more formed and bulky with a diet high in fiber and utilization of over-the-counter fiber supplementation (totaling 25-35 grams of fiber/day). Stool softeners and increasing water intake may be necessary to promote soft bowel movements and aid in the healing process. Topical anesthetics for pain and warm tub baths (sitz baths) for 10-20 minutes several times a day (especially after bowel movements) are soothing and promote relaxation of the anal muscles, which may help the healing process.  Other medications (such as nitroglycerin, nifedipine, or diltiazem) may be prescribed that allow relaxation of the anal sphincter muscles. Your surgeon will go over benefits and side-effects of each of these with you. Narcotic pain medications are not recommended for anal fissures, as they promote constipation. Chronic fissures are generally more difficult to treat, and your surgeon may advise surgical treatment. WILL THE PROBLEM RETURN? Fissures can recur easily, and it is quite common for a fully healed fissure to recur after a hard bowel movement or other trauma. Even when the pain and bleeding have subsided, it is very important to continue good bowel habits and a diet high in fiber as a lifestyle change. If the problem returns without an obvious cause, further assessment is warranted. WHAT CAN BE DONE IF THE FISSURE DOES NOT HEAL? A fissure that fails to respond to conservative measures should be re-examined. Persistent hard or loose   bowel movements, scarring, or spasm of the internal anal muscle all contribute to delayed healing. Other medical problems such as inflammatory bowel disease (Crohn's disease), infections, or anal tumors can cause symptoms similar to anal fissures.  Patients suffering from persistent anal pain should be examined to exclude these symptoms. This may include a colonoscopy or an exam in the operating room under anesthesia. WHAT DOES SURGERY INVOLVE? Surgical options for treating anal fissure include Botulinum toxin (Botox) injection into the anal sphincter and surgical division of a portion of the internal anal sphincter (lateral internal sphincterotomy). Both of these are performed typically as outpatient, same-day procedures, or occasionally in the office setting. The goal of these surgical options is to promote relaxation of the anal sphincter, thereby decreasing anal pain and spasm, allowing the fissure to heal. Botox injection results in healing in 50-80% of patients, while sphincterotomy is reported to be over 90% successful. If a sentinel pile is present, it may be removed to promote healing of the fissure. All surgical procedures carry some risk, and a sphincterotomy can rarely interfere with one's ability to control gas and stool. Your colon and rectal surgeon will discuss these risks with you to determine the appropriate treatment for your particular situation. HOW LONG IS THE RECOVERY AFTER SURGERY? It is important to note that complete healing with both medical and surgical treatments can take up to approximately 6-10 weeks. However, acute pain after surgery often disappears after a few days. Most patients will be able to return to work and resume daily activities in a few short days after the surgery. CAN FISSURES LEAD TO COLON CANCER? Absolutely not. Persistent symptoms, however, need careful evaluation since other conditions other than an anal fissure can cause similar symptoms. Your colon and rectal surgeon may request additional tests, even if your fissure has successfully healed. A colonoscopy may be required to exclude other causes of rectal bleeding. WHAT IS A COLON AND RECTAL SURGEON? Colon and rectal surgeons are experts in the  surgical and non-surgical treatment of diseases of the colon, rectum and anus. They have completed advanced surgical training in the treatment of these diseases as well as full general surgical training. Board-certified colon and rectal surgeons complete residencies in general surgery and colon and rectal surgery, and pass intensive examinations conducted by the American Board of Surgery and the American Board of Colon and Rectal Surgery. They are well-versed in the treatment of both benign and malignant diseases of the colon, rectum and anus and are able to perform routine screening examinations and surgically treat conditions if indicated to do so.  Author: Tyrone Apple. Pennie Banter, DO, on behalf of the [employer] Committee   2012 American Society of Colon & Rectal Surgeons   Anal Fistula   What is an anal fistula? An anal fistula (also called fistula-in-ano) is frequently the result of a previous or current anal abscess, occurring in up to 50% of patients with abscesses. Normal anatomy includes small glands just inside the anus. Occasionally, these glands get clogged and potentially can become infected, leading to an abscess. The fistula is a tunnel that forms under the skin and connects the infected glands to the abscess. A fistula can be present with or without an abscess and may connect just to the skin of the buttocks near the anal opening. Other situations that can result in a fistula include Crohn's disease, radiation, trauma and malignancy. How does someone get an anal abscess or a fistula? The abscess is most often a  result of an acute infection in the internal glands of the anus. Occasionally, bacteria, fecal material or foreign matter can clog the anal gland and create a condition for an abscess cavity to form. Other medical conditions can make these types of infections more likely.  After an abscess drains on its own or has been drained (opened), a tunnel (fistula) may persist,  connecting the infected anal gland to the external skin. This typically will involve some type of drainage from the external opening and occurs in up to 50% of abscesses. If the opening on the skin heals when a fistula is present, a recurrent abscess may develop. What are the specific signs or symptoms of an abscess or fistula? A patient with an abscess may have pain, redness or swelling in the area around the anal area. Fatigue, general malaise, as well as accompanying fever or chills are also common. Similar signs and symptoms may be present when patients have a fistula, with the addition of possible irritation of the perianal skin or drainage from an external opening. Is any specific testing necessary to diagnose an abscess or fistula? No. Most anal abscesses or fistula-in-ano are diagnosed and managed on the basis of clinical findings. Occasionally, additional studies such as ultrasound, CT scan, or MRI can assist with the diagnosis of deeper abscesses or the delineation of the fistula tunnel to help guide treatment.  What is the treatment of an anal abscess? The treatment of an abscess is surgical drainage under most circumstances. An incision is made in the skin near the anus to drain the infection. This can be done in a doctor's office with local anesthetic or in an operating room under deeper anesthesia. Hospitalization may be required for patients prone to more significant infections such as diabetics or patients with decreased immunity. Are antibiotics required to treat this type of infection? Antibiotics alone are a poor alternative to drainage of the infection. For uncomplicated abscesses, the addition of antibiotics to surgical drainage does not improve healing time or reduce the potential for recurrences. There are some conditions in which antibiotics are indicated, such as for patients with compromised or altered immunity, some cardiac valvular conditions or extensive cellulitis. A  comprehensive discussion of your past medical history and a physical exam are important to determine if antibiotics are indicated. What is the treatment of an anal fistula? Surgery is almost always necessary to cure an anal fistula. Although surgery can be fairly straightforward, it may also be complicated, occasionally requiring staged or multiple operations. Consider identifying a specialist in colon and rectal surgery who would be familiar with a number of potential operations to treat the fistula. The surgery may be performed at the same time as drainage of an abscess, although sometimes the fistula doesn't appear until weeks to years after the initial drainage. If the fistula is straightforward, a fistulotomy may be performed. This procedure involves connecting the internal opening within the anal canal to the external opening, creating a groove that will heal from the inside out. This surgery often will require dividing a small portion of the sphincter muscle which has the unlikely potential for affecting the control of bowel movements in a limited number of cases.  Other procedures include placing material within the fistula tract to occlude it or surgically altering the surrounding tissue to accomplish closure of the fistula, with the choice of procedure depending upon the type, length, and location of the fistula. Most of the operations can be performed on an outpatient basis, but  may occasionally require hospitalization. What is the recovery like from surgery? Pain after surgery is controlled with pain pills, fiber and bulk laxatives. Patients should plan for time at home using sitz baths and attempt to avoid the constipation that can be associated with prescription pain medication. Discuss with your surgeon the specific care and time away from work prior to surgery to prepare yourself for post-operative care. Can the abscess or fistula recur? If adequately treated and properly healed, both are  unlikely to return. However, despite proper and indicated open or minimally invasive treatment, both abscesses and fistulas can potentially recur. Should similar symptoms arise, suggesting recurrence, it is recommended that you find a colon and rectal surgeon to manage your condition. What is a colon and rectal surgeon? Colon and rectal surgeons are experts in the surgical and non-surgical treatment of diseases of the colon, rectum and anus. They have completed advanced surgical training in the treatment of these diseases as well as full general surgical training. Board-certified colon and rectal surgeons complete residencies in general surgery and colon and rectal surgery, and pass intensive examinations conducted by the American Board of Surgery and the American Board of Colon and Rectal Surgery. They are well-versed in the treatment of both benign and malignant diseases of the colon, rectum and anus and are able to perform routine screening examinations and surgically treat conditions if indicated to do so.  author: Mikle Bosworth, MD, FACS, FASCRS, on behalf of the [employer] Committee  6045 American Society of Colon & Rectal Surgeons

## 2012-07-14 ENCOUNTER — Encounter (HOSPITAL_BASED_OUTPATIENT_CLINIC_OR_DEPARTMENT_OTHER): Payer: Self-pay | Admitting: *Deleted

## 2012-07-15 ENCOUNTER — Encounter (HOSPITAL_BASED_OUTPATIENT_CLINIC_OR_DEPARTMENT_OTHER): Payer: Self-pay | Admitting: *Deleted

## 2012-07-15 NOTE — Progress Notes (Signed)
NPO AFTER MN. ARRIVES AT 0800. NEEDS ISTAT AND EKG. WILL TAKE ATENOLOL AM OF SURG W/ SIP OF WATER.

## 2012-07-17 DIAGNOSIS — H18839 Recurrent erosion of cornea, unspecified eye: Secondary | ICD-10-CM | POA: Diagnosis not present

## 2012-07-22 ENCOUNTER — Encounter (HOSPITAL_BASED_OUTPATIENT_CLINIC_OR_DEPARTMENT_OTHER): Payer: Self-pay | Admitting: Anesthesiology

## 2012-07-22 ENCOUNTER — Ambulatory Visit (HOSPITAL_BASED_OUTPATIENT_CLINIC_OR_DEPARTMENT_OTHER): Payer: Medicare Other | Admitting: Anesthesiology

## 2012-07-22 ENCOUNTER — Ambulatory Visit (HOSPITAL_BASED_OUTPATIENT_CLINIC_OR_DEPARTMENT_OTHER)
Admission: RE | Admit: 2012-07-22 | Discharge: 2012-07-22 | Disposition: A | Discharge status: Home / Self Care | Payer: Medicare Other | Source: Ambulatory Visit | Attending: General Surgery | Admitting: General Surgery

## 2012-07-22 ENCOUNTER — Other Ambulatory Visit: Payer: Self-pay

## 2012-07-22 ENCOUNTER — Encounter (HOSPITAL_BASED_OUTPATIENT_CLINIC_OR_DEPARTMENT_OTHER): Payer: Self-pay | Admitting: *Deleted

## 2012-07-22 ENCOUNTER — Encounter (HOSPITAL_BASED_OUTPATIENT_CLINIC_OR_DEPARTMENT_OTHER)
Admission: RE | Disposition: A | Discharge status: Home / Self Care | Payer: Self-pay | Source: Ambulatory Visit | Attending: General Surgery

## 2012-07-22 DIAGNOSIS — Z79899 Other long term (current) drug therapy: Secondary | ICD-10-CM | POA: Insufficient documentation

## 2012-07-22 DIAGNOSIS — E7800 Pure hypercholesterolemia, unspecified: Secondary | ICD-10-CM | POA: Diagnosis not present

## 2012-07-22 DIAGNOSIS — K219 Gastro-esophageal reflux disease without esophagitis: Secondary | ICD-10-CM | POA: Insufficient documentation

## 2012-07-22 DIAGNOSIS — K603 Anal fistula, unspecified: Secondary | ICD-10-CM | POA: Insufficient documentation

## 2012-07-22 DIAGNOSIS — K624 Stenosis of anus and rectum: Secondary | ICD-10-CM | POA: Diagnosis not present

## 2012-07-22 DIAGNOSIS — I10 Essential (primary) hypertension: Secondary | ICD-10-CM | POA: Insufficient documentation

## 2012-07-22 DIAGNOSIS — Z951 Presence of aortocoronary bypass graft: Secondary | ICD-10-CM | POA: Insufficient documentation

## 2012-07-22 DIAGNOSIS — K5900 Constipation, unspecified: Secondary | ICD-10-CM | POA: Diagnosis not present

## 2012-07-22 DIAGNOSIS — I2510 Atherosclerotic heart disease of native coronary artery without angina pectoris: Secondary | ICD-10-CM | POA: Insufficient documentation

## 2012-07-22 DIAGNOSIS — N400 Enlarged prostate without lower urinary tract symptoms: Secondary | ICD-10-CM | POA: Diagnosis not present

## 2012-07-22 DIAGNOSIS — K648 Other hemorrhoids: Secondary | ICD-10-CM | POA: Diagnosis not present

## 2012-07-22 HISTORY — DX: Other specified postprocedural states: Z85.820

## 2012-07-22 HISTORY — DX: Occlusion and stenosis of left carotid artery: I65.22

## 2012-07-22 HISTORY — PX: EVALUATION UNDER ANESTHESIA WITH ANAL FISTULECTOMY: SHX5621

## 2012-07-22 HISTORY — DX: Presence of aortocoronary bypass graft: Z95.1

## 2012-07-22 HISTORY — DX: Personal history of other diseases of the digestive system: Z87.19

## 2012-07-22 HISTORY — DX: Personal history of malignant melanoma of skin: Z85.820

## 2012-07-22 HISTORY — DX: Presence of coronary angioplasty implant and graft: Z95.5

## 2012-07-22 HISTORY — DX: Other specified postprocedural states: Z98.890

## 2012-07-22 HISTORY — DX: Anal fistula, unspecified: K60.30

## 2012-07-22 HISTORY — DX: Anal fistula: K60.3

## 2012-07-22 HISTORY — DX: Nonrheumatic mitral (valve) insufficiency: I34.0

## 2012-07-22 HISTORY — DX: Allergic rhinitis, unspecified: J30.9

## 2012-07-22 LAB — POCT I-STAT 4, (NA,K, GLUC, HGB,HCT)
HCT: 43 % (ref 39.0–52.0)
Hemoglobin: 14.6 g/dL (ref 13.0–17.0)
Potassium: 4.1 mEq/L (ref 3.5–5.1)
Sodium: 137 mEq/L (ref 135–145)

## 2012-07-22 SURGERY — EXAM UNDER ANESTHESIA WITH ANAL FISTULECTOMY
Anesthesia: Monitor Anesthesia Care | Site: Rectum | Wound class: Clean Contaminated

## 2012-07-22 MED ORDER — 0.9 % SODIUM CHLORIDE (POUR BTL) OPTIME
TOPICAL | Status: DC | PRN
  Administered 2012-07-22: 1000 mL

## 2012-07-22 MED ORDER — ONDANSETRON HCL 4 MG/2ML IJ SOLN
4.0000 mg | Freq: Four times a day (QID) | INTRAMUSCULAR | Status: DC | PRN
  Filled 2012-07-22: qty 2

## 2012-07-22 MED ORDER — SODIUM CHLORIDE 0.9 % IJ SOLN
3.0000 mL | INTRAMUSCULAR | Status: DC | PRN
  Filled 2012-07-22: qty 3

## 2012-07-22 MED ORDER — SODIUM CHLORIDE 0.9 % IV SOLN
250.0000 mg | INTRAVENOUS | Status: DC | PRN
  Administered 2012-07-22: 8 ug/kg/min via INTRAVENOUS

## 2012-07-22 MED ORDER — ACETAMINOPHEN 325 MG PO TABS
650.0000 mg | ORAL_TABLET | ORAL | Status: DC | PRN
  Filled 2012-07-22: qty 2

## 2012-07-22 MED ORDER — HYDROCODONE-ACETAMINOPHEN 5-325 MG PO TABS: 1.0000 | ORAL_TABLET | ORAL | Status: DC | PRN

## 2012-07-22 MED ORDER — SODIUM CHLORIDE 0.9 % IJ SOLN
3.0000 mL | Freq: Two times a day (BID) | INTRAMUSCULAR | Status: DC
  Filled 2012-07-22: qty 3

## 2012-07-22 MED ORDER — LIDOCAINE 5 % EX OINT
TOPICAL_OINTMENT | CUTANEOUS | Status: DC | PRN
  Administered 2012-07-22: 1

## 2012-07-22 MED ORDER — SODIUM CHLORIDE 0.9 % IV SOLN
250.0000 mL | INTRAVENOUS | Status: DC | PRN
  Filled 2012-07-22: qty 250

## 2012-07-22 MED ORDER — ACETAMINOPHEN 650 MG RE SUPP
650.0000 mg | RECTAL | Status: DC | PRN
  Filled 2012-07-22: qty 1

## 2012-07-22 MED ORDER — LACTATED RINGERS IV SOLN
INTRAVENOUS | Status: DC
  Administered 2012-07-22: 08:00:00 via INTRAVENOUS
  Filled 2012-07-22: qty 1000

## 2012-07-22 MED ORDER — BUPIVACAINE-EPINEPHRINE 0.25% -1:200000 IJ SOLN
INTRAMUSCULAR | Status: DC | PRN
  Administered 2012-07-22: 20 mL

## 2012-07-22 MED ORDER — FENTANYL CITRATE 0.05 MG/ML IJ SOLN
INTRAMUSCULAR | Status: DC | PRN
  Administered 2012-07-22: 25 ug via INTRAVENOUS
  Administered 2012-07-22: 50 ug via INTRAVENOUS
  Administered 2012-07-22: 25 ug via INTRAVENOUS

## 2012-07-22 MED ORDER — OXYCODONE HCL 5 MG PO TABS
5.0000 mg | ORAL_TABLET | ORAL | Status: DC | PRN
  Filled 2012-07-22: qty 2

## 2012-07-22 MED ORDER — PROPOFOL 10 MG/ML IV EMUL
INTRAVENOUS | Status: DC | PRN
  Administered 2012-07-22: 100 ug/kg/min via INTRAVENOUS

## 2012-07-22 SURGICAL SUPPLY — 48 items
APL SKNCLS STERI-STRIP NONHPOA (GAUZE/BANDAGES/DRESSINGS) ×1
BENZOIN TINCTURE PRP APPL 2/3 (GAUZE/BANDAGES/DRESSINGS) ×2 IMPLANT
BLADE HEX COATED 2.75 (ELECTRODE) ×2 IMPLANT
BLADE SURG 10 STRL SS (BLADE) IMPLANT
BLADE SURG 15 STRL LF DISP TIS (BLADE) ×1 IMPLANT
BLADE SURG 15 STRL SS (BLADE) ×2
BRIEF STRETCH FOR OB PAD LRG (UNDERPADS AND DIAPERS) ×2 IMPLANT
CANISTER SUCTION 2500CC (MISCELLANEOUS) ×2 IMPLANT
CLOTH BEACON ORANGE TIMEOUT ST (SAFETY) ×2 IMPLANT
COVER MAYO STAND STRL (DRAPES) ×2 IMPLANT
COVER TABLE BACK 60X90 (DRAPES) ×2 IMPLANT
DECANTER SPIKE VIAL GLASS SM (MISCELLANEOUS) ×2 IMPLANT
DRAPE LG THREE QUARTER DISP (DRAPES) ×4 IMPLANT
DRAPE PED LAPAROTOMY (DRAPES) ×2 IMPLANT
DRAPE UNDERBUTTOCKS STRL (DRAPE) IMPLANT
DRSG PAD ABDOMINAL 8X10 ST (GAUZE/BANDAGES/DRESSINGS) IMPLANT
ELECT BLADE 6.5 .24CM SHAFT (ELECTRODE) IMPLANT
ELECT REM PT RETURN 9FT ADLT (ELECTROSURGICAL) ×2
ELECTRODE REM PT RTRN 9FT ADLT (ELECTROSURGICAL) ×1 IMPLANT
GAUZE SPONGE 4X4 16PLY XRAY LF (GAUZE/BANDAGES/DRESSINGS) IMPLANT
GAUZE VASELINE 3X9 (GAUZE/BANDAGES/DRESSINGS) IMPLANT
GLOVE BIO SURGEON STRL SZ 6.5 (GLOVE) ×5 IMPLANT
GLOVE INDICATOR 7.0 STRL GRN (GLOVE) ×6 IMPLANT
GOWN PREVENTION PLUS LG XLONG (DISPOSABLE) ×2 IMPLANT
GOWN PREVENTION PLUS XLARGE (GOWN DISPOSABLE) ×2 IMPLANT
GOWN STRL REIN 3XL XLG LVL4 (GOWN DISPOSABLE) ×1 IMPLANT
GOWN STRL REIN XL XLG (GOWN DISPOSABLE) ×2 IMPLANT
LEGGING LITHOTOMY PAIR STRL (DRAPES) IMPLANT
NDL HYPO 25X1 1.5 SAFETY (NEEDLE) ×1 IMPLANT
NDL SAFETY ECLIPSE 18X1.5 (NEEDLE) IMPLANT
NEEDLE HYPO 18GX1.5 SHARP (NEEDLE) ×2
NEEDLE HYPO 25X1 1.5 SAFETY (NEEDLE) ×2 IMPLANT
NS IRRIG 500ML POUR BTL (IV SOLUTION) ×2 IMPLANT
PACK BASIN DAY SURGERY FS (CUSTOM PROCEDURE TRAY) ×2 IMPLANT
PENCIL BUTTON HOLSTER BLD 10FT (ELECTRODE) ×2 IMPLANT
SPONGE GAUZE 4X4 12PLY (GAUZE/BANDAGES/DRESSINGS) IMPLANT
SPONGE SURGIFOAM ABS GEL 12-7 (HEMOSTASIS) IMPLANT
SUT CHROMIC 2 0 SH (SUTURE) ×2 IMPLANT
SUT CHROMIC 3 0 SH 27 (SUTURE) IMPLANT
SUT MON AB 3-0 SH 27 (SUTURE) ×2
SUT MON AB 3-0 SH27 (SUTURE) ×1 IMPLANT
SUT VIC AB 4-0 P-3 18XBRD (SUTURE) IMPLANT
SUT VIC AB 4-0 P3 18 (SUTURE)
SYR CONTROL 10ML LL (SYRINGE) ×2 IMPLANT
TOWEL OR 17X24 6PK STRL BLUE (TOWEL DISPOSABLE) ×2 IMPLANT
TRAY DSU PREP LF (CUSTOM PROCEDURE TRAY) ×2 IMPLANT
TUBE CONNECTING 12X1/4 (SUCTIONS) ×2 IMPLANT
YANKAUER SUCT BULB TIP NO VENT (SUCTIONS) ×2 IMPLANT

## 2012-07-22 NOTE — H&P (View-Only) (Signed)
Chief Complaint  Patient presents with  . New Evaluation    eval rectal fissure    HISTORY: Jason Velazquez is a 79 y.o. male who presents to the office with anal pain.  This had been occurring for 4 weeks.  He has tried several creams in the past with no success.  Nothing makes the symptoms worse.   It is intermittent in nature.  His bowel habits are regular and his bowel movements are occasionally hard.  He has had trouble with constipation all of his life and currently takes miralax and colace daily.   His fiber intake is moderate.   He has had several surgeries to remove skin tags in the past.    Past Medical History  Diagnosis Date  . Hypertension   . Hypercholesteremia   . Heart murmur   . Coronary artery disease   . Blood transfusion 1988  . Benign prostatic hypertrophy   . Blood transfusion without reported diagnosis   . GERD (gastroesophageal reflux disease)   . Cancer     melanoma on face      Past Surgical History  Procedure Laterality Date  . Coronary stent placement      August 17, 1998, 1 stent done  . Stent placment  04-02-2004    x1  . Tonsillectomy  1944     t and a  . Coronary artery bypass graft  1988    x 4  . Right rotator cuff repair  07-10-1999  . Bilateral catarct extraction  jan and march 2012  . Transurethral resection of prostate  04/09/2011    Procedure: TRANSURETHRAL RESECTION OF THE PROSTATE WITH GYRUS INSTRUMENTS;  Surgeon: John J Wrenn, MD;  Location: [facility];  Service: Urology;  Laterality: N/A;        Current Outpatient Prescriptions  Medication Sig Dispense Refill  . aspirin 325 MG EC tablet Take 325 mg by mouth daily after breakfast.      . atenolol (TENORMIN) 25 MG tablet Take 25 mg by mouth daily after breakfast.      . atorvastatin (LIPITOR) 20 MG tablet Take 20 mg by mouth at bedtime.      . docusate sodium (COLACE) 100 MG capsule Take 100 mg by mouth 2 (two) times daily.      . nitroGLYCERIN (NITROSTAT) 0.4 MG SL tablet Place 0.4 mg under  the tongue every 5 (five) minutes as needed.      . polyethylene glycol (MIRALAX / GLYCOLAX) packet Take 17 g by mouth daily.      . sodium chloride (OCEAN) 0.65 % nasal spray Place 1-4 sprays into the nose 2 (two) times daily.      . Multiple Vitamin (MULITIVITAMIN WITH MINERALS) TABS Take 1 tablet by mouth daily.       No current facility-administered medications for this visit.      Allergies  Allergen Reactions  . Niacin And Related     Burning and shaking all over....red skin  . Adhesive (Tape) Rash  . Iodine Rash    Rash when applied to skin      Family History  Problem Relation Age of Onset  . Cancer Mother     breast  . Cancer Brother     colon    History   Social History  . Marital Status: Married    Spouse Name: N/A    Number of Children: N/A  . Years of Education: N/A   Social History Main Topics  . Smoking status:   Never Smoker   . Smokeless tobacco: Never Used  . Alcohol Use: No  . Drug Use: No  . Sexually Active: None   Other Topics Concern  . None   Social History Narrative  . None      REVIEW OF SYSTEMS - PERTINENT POSITIVES ONLY: Review of Systems - General ROS: negative for - chills, fever or weight loss Hematological and Lymphatic ROS: negative for - bleeding problems, blood clots or bruising Respiratory ROS: no cough, shortness of breath, or wheezing Cardiovascular ROS: no chest pain or dyspnea on exertion Gastrointestinal ROS: no abdominal pain, change in bowel habits, or black or bloody stools Genito-Urinary ROS: no dysuria, trouble voiding, or hematuria  EXAM: Filed Vitals:   07/07/12 1027  BP: 146/82  Pulse: 65  Temp: 97 F (36.1 C)    General appearance: alert and cooperative Resp: clear to auscultation bilaterally Cardio: regular rate and rhythm GI: soft, non-tender; bowel sounds normal; no masses,  no organomegaly   Procedure: Anoscopy Surgeon: Margrette Wynia Diagnosis: anal pain  Assistant: Glaspey After the risks and  benefits were explained, verbal consent was obtained for above procedure  Anesthesia: none Findings: posterior anal fistula, slight anal stenosis, grade 2 internal hemorrhoids    ASSESSMENT AND PLAN: Jason Velazquez is a 79 y.o. M with chronic constipation and anal pain.  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  The distrubution pattern on his skin is consistent with irritation from his draining fistula.  I think there is a reasonable chance we can resolve these issues with a simple fistulotomy.  I think the major risks with be bleeding, infection and recurrence.  I think that all of these risks are low.      Cina Klumpp C Emaree Chiu, MD Colon and Rectal Surgery / General Surgery [facility]      Visit Diagnoses: No diagnosis found.  Primary Care Physician: STONEKING,HAL Litha Lamartina, MD   

## 2012-07-22 NOTE — Anesthesia Preprocedure Evaluation (Signed)
Anesthesia Evaluation  Patient identified by MRN, date of birth, ID band Patient awake    Reviewed: Allergy & Precautions, H&P , NPO status , Patient's Chart, lab work & pertinent test results  Airway Mallampati: II TM Distance: >3 FB Neck ROM: Full    Dental no notable dental hx.    Pulmonary neg pulmonary ROS,  breath sounds clear to auscultation  Pulmonary exam normal       Cardiovascular hypertension, Pt. on home beta blockers + CAD, + Cardiac Stents, + CABG (1988) and + Peripheral Vascular Disease ( Left carotid stenosis  > 50%  PER DUPLEX  03-28-2011) + Valvular Problems/Murmurs Rhythm:Regular Rate:Normal  POST CABG--  STENTING 2000  AND RESTENTING 2006 FOR REINSTENT STENOSIS   Neuro/Psych negative neurological ROS  negative psych ROS   GI/Hepatic negative GI ROS, Neg liver ROS,   Endo/Other  negative endocrine ROS  Renal/GU negative Renal ROS  negative genitourinary   Musculoskeletal negative musculoskeletal ROS (+)   Abdominal   Peds negative pediatric ROS (+)  Hematology negative hematology ROS (+)   Anesthesia Other Findings   Reproductive/Obstetrics negative OB ROS                           Anesthesia Physical  Anesthesia Plan  ASA: III  Anesthesia Plan: MAC   Post-op Pain Management:    Induction:   Airway Management Planned: Simple Face Mask  Additional Equipment:   Intra-op Plan:   Post-operative Plan:   Informed Consent: I have reviewed the patients History and Physical, chart, labs and discussed the procedure including the risks, benefits and alternatives for the proposed anesthesia with the patient or authorized representative who has indicated his/her understanding and acceptance.   Dental advisory given  Plan Discussed with: CRNA  Anesthesia Plan Comments:         Anesthesia Quick Evaluation

## 2012-07-22 NOTE — Interval H&P Note (Signed)
History and Physical Interval Note:  07/22/2012 8:58 AM  Jason Velazquez  has presented today for surgery, with the diagnosis of anal fistula  The various methods of treatment have been discussed with the patient and family. After consideration of risks, benefits and other options for treatment, the patient has consented to  Procedure(s): EXAM UNDER ANESTHESIA WITH ANAL fistulotomy (N/A) as a surgical intervention .  The patient's history has been reviewed, patient examined, no change in status, stable for surgery.  I have reviewed the patient's chart and labs.  Questions were answered to the patient's satisfaction.     Vanita Panda, MD  Colorectal and General Surgery [facility]

## 2012-07-22 NOTE — Op Note (Signed)
07/22/2012  9:41 AM  PATIENT:  Jason Velazquez  79 y.o. male  Patient Care Team: Hal Stormy Fabian, MD as PCP - General (Internal Medicine)  PRE-OPERATIVE DIAGNOSIS:  anal fistula  POST-OPERATIVE DIAGNOSIS:  anal fistula  PROCEDURE:  Procedure(s): EXAM UNDER ANESTHESIA WITH ANAL fistulotomy  SURGEON:  Surgeon(s): Romie Levee, MD  ASSISTANT: none   ANESTHESIA:   local and IV sedation  EBL: min  Total I/O In: 600 [I.V.:600] Out: -   Delay start of Pharmacological VTE agent (>24hrs) due to surgical blood loss or risk of bleeding:  no  DRAINS: none   SPECIMEN:  No Specimen  DISPOSITION OF SPECIMEN:  N/A  COUNTS:  YES  PLAN OF CARE: Discharge to home after PACU  PATIENT DISPOSITION:  PACU - hemodynamically stable.  INDICATION: This is a 79 y.o. M who presents to my office with some perianal skin irritation.  On exam, he appeared to have a posterior anal fistula.  We are here to treat this in the OR  OR FINDINGS: shallow posterior anal fistula, anal stenosis  DESCRIPTION: The patient was identified in the preoperative holding area and taken to the OR where they were laid prone on the operating room table. MAC anesthesia was smoothly induced.  The patient was then prepped and draped in the usual sterile fashion. A surgical timeout was performed indicating the correct patient, procedure, positioning and preoperative antibiotics. SCDs were noted to be in place and functioning prior to the operation.  A perirectal block was performed with 0.25% Marcaine.    I began with an anoscopy using the small Hill Ferguson anoscope. The patient had some mildly inflamed internal hemorrhoids and a slight anal stenosis of the perianal skin circumferentially.  There was an external opening noted posteriorly.  A fistula probe was inserted and the internal opening was located above the internal sphincter.  This was transected with the Bovie.  Hemostasis was achieved and lidocaine ointment was  placed over the incision.  The patient was awakened from anesthesia and sent to the PACU in stable condition.  All counts were correct per OR staff.

## 2012-07-22 NOTE — Transfer of Care (Signed)
Immediate Anesthesia Transfer of Care Note  Patient: Jason Velazquez  Procedure(s) Performed: Procedure(s) (LRB): EXAM UNDER ANESTHESIA WITH ANAL fistulotomy (N/A)  Patient Location: PACU  Anesthesia Type: MAC  Level of Consciousness: awake, alert , oriented and patient cooperative  Airway & Oxygen Therapy: Patient Spontanous Breathing and Patient connected to face mask oxygen  Post-op Assessment: Report given to PACU RN and Post -op Vital signs reviewed and stable  Post vital signs: Reviewed and stable  Complications: No apparent anesthesia complications

## 2012-07-22 NOTE — Anesthesia Postprocedure Evaluation (Signed)
  Anesthesia Post-op Note  Patient: Jason Velazquez  Procedure(s) Performed: Procedure(s) (LRB): EXAM UNDER ANESTHESIA WITH ANAL fistulotomy (N/A)  Patient Location: PACU  Anesthesia Type: MAC  Level of Consciousness: awake and alert   Airway and Oxygen Therapy: Patient Spontanous Breathing  Post-op Pain: mild  Post-op Assessment: Post-op Vital signs reviewed, Patient's Cardiovascular Status Stable, Respiratory Function Stable, Patent Airway and No signs of Nausea or vomiting  Last Vitals:  Filed Vitals:   07/22/12 1015  BP:   Pulse: 53  Temp:   Resp: 19    Post-op Vital Signs: stable   Complications: No apparent anesthesia complications

## 2012-07-23 ENCOUNTER — Encounter (HOSPITAL_BASED_OUTPATIENT_CLINIC_OR_DEPARTMENT_OTHER): Payer: Self-pay | Admitting: General Surgery

## 2012-08-04 ENCOUNTER — Encounter (INDEPENDENT_AMBULATORY_CARE_PROVIDER_SITE_OTHER): Payer: Self-pay | Admitting: General Surgery

## 2012-08-04 ENCOUNTER — Ambulatory Visit (INDEPENDENT_AMBULATORY_CARE_PROVIDER_SITE_OTHER): Payer: Medicare Other | Admitting: General Surgery

## 2012-08-04 VITALS — BP 122/80 | HR 60 | Temp 97.6°F | Resp 15 | Ht 66.0 in | Wt 165.6 lb

## 2012-08-04 DIAGNOSIS — Z9889 Other specified postprocedural states: Secondary | ICD-10-CM

## 2012-08-04 NOTE — Patient Instructions (Signed)
Return to office as needed.

## 2012-08-04 NOTE — Progress Notes (Signed)
Nader Boys is a 79 y.o. male who is status post a superficial fistulotomy on 6/11.  He is doing well.  He denies any pain.  His drainage stopped a couple days ago.    Objective: Filed Vitals:   08/04/12 1157  BP: 122/80  Pulse: 60  Temp: 97.6 F (36.4 C)  Resp: 15    General appearance: alert and cooperative  Incision: healing well, small amt of granulation tissue left   Assessment: s/p  Patient Active Problem List   Diagnosis Date Noted  . BPH (benign prostatic hypertrophy) with urinary obstruction 04/09/2011    Plan: Ok to return to full diet and activity.  F/U PRN    .Vanita Panda, MD [facility], [place] [phone]   08/04/2012 12:02 PM

## 2012-08-17 DIAGNOSIS — H18839 Recurrent erosion of cornea, unspecified eye: Secondary | ICD-10-CM | POA: Diagnosis not present

## 2012-11-09 DIAGNOSIS — I10 Essential (primary) hypertension: Secondary | ICD-10-CM | POA: Diagnosis not present

## 2012-11-09 DIAGNOSIS — K219 Gastro-esophageal reflux disease without esophagitis: Secondary | ICD-10-CM | POA: Diagnosis not present

## 2012-11-30 ENCOUNTER — Other Ambulatory Visit: Payer: Self-pay | Admitting: Geriatric Medicine

## 2012-11-30 ENCOUNTER — Ambulatory Visit
Admission: RE | Admit: 2012-11-30 | Discharge: 2012-11-30 | Disposition: A | Discharge status: Home / Self Care | Payer: Medicare Other | Source: Ambulatory Visit | Attending: Internal Medicine | Admitting: Internal Medicine

## 2012-11-30 ENCOUNTER — Other Ambulatory Visit: Payer: Self-pay | Admitting: Internal Medicine

## 2012-11-30 DIAGNOSIS — R4182 Altered mental status, unspecified: Secondary | ICD-10-CM

## 2012-11-30 DIAGNOSIS — G459 Transient cerebral ischemic attack, unspecified: Secondary | ICD-10-CM | POA: Diagnosis not present

## 2012-11-30 DIAGNOSIS — F29 Unspecified psychosis not due to a substance or known physiological condition: Secondary | ICD-10-CM | POA: Diagnosis not present

## 2012-12-02 ENCOUNTER — Other Ambulatory Visit: Payer: Medicare Other

## 2012-12-02 ENCOUNTER — Ambulatory Visit
Admission: RE | Admit: 2012-12-02 | Discharge: 2012-12-02 | Disposition: A | Discharge status: Home / Self Care | Payer: Medicare Other | Source: Ambulatory Visit | Attending: Internal Medicine | Admitting: Internal Medicine

## 2012-12-02 DIAGNOSIS — R4182 Altered mental status, unspecified: Secondary | ICD-10-CM

## 2012-12-02 DIAGNOSIS — I658 Occlusion and stenosis of other precerebral arteries: Secondary | ICD-10-CM | POA: Diagnosis not present

## 2012-12-07 DIAGNOSIS — F05 Delirium due to known physiological condition: Secondary | ICD-10-CM | POA: Diagnosis not present

## 2012-12-07 DIAGNOSIS — G459 Transient cerebral ischemic attack, unspecified: Secondary | ICD-10-CM | POA: Diagnosis not present

## 2012-12-07 DIAGNOSIS — I10 Essential (primary) hypertension: Secondary | ICD-10-CM | POA: Diagnosis not present

## 2012-12-08 DIAGNOSIS — Z23 Encounter for immunization: Secondary | ICD-10-CM | POA: Diagnosis not present

## 2012-12-14 ENCOUNTER — Ambulatory Visit (INDEPENDENT_AMBULATORY_CARE_PROVIDER_SITE_OTHER): Payer: Medicare Other | Admitting: Neurology

## 2012-12-14 ENCOUNTER — Encounter (INDEPENDENT_AMBULATORY_CARE_PROVIDER_SITE_OTHER): Payer: Self-pay

## 2012-12-14 ENCOUNTER — Encounter: Payer: Self-pay | Admitting: Neurology

## 2012-12-14 VITALS — BP 163/90 | HR 60 | Ht 67.5 in | Wt 170.0 lb

## 2012-12-14 DIAGNOSIS — R6889 Other general symptoms and signs: Secondary | ICD-10-CM

## 2012-12-14 DIAGNOSIS — G459 Transient cerebral ischemic attack, unspecified: Secondary | ICD-10-CM | POA: Insufficient documentation

## 2012-12-14 DIAGNOSIS — E782 Mixed hyperlipidemia: Secondary | ICD-10-CM

## 2012-12-14 DIAGNOSIS — I6350 Cerebral infarction due to unspecified occlusion or stenosis of unspecified cerebral artery: Secondary | ICD-10-CM

## 2012-12-14 DIAGNOSIS — F05 Delirium due to known physiological condition: Secondary | ICD-10-CM | POA: Diagnosis not present

## 2012-12-14 DIAGNOSIS — I639 Cerebral infarction, unspecified: Secondary | ICD-10-CM

## 2012-12-14 DIAGNOSIS — E1149 Type 2 diabetes mellitus with other diabetic neurological complication: Secondary | ICD-10-CM | POA: Diagnosis not present

## 2012-12-14 DIAGNOSIS — R410 Disorientation, unspecified: Secondary | ICD-10-CM | POA: Insufficient documentation

## 2012-12-14 DIAGNOSIS — E7849 Other hyperlipidemia: Secondary | ICD-10-CM

## 2012-12-14 NOTE — Progress Notes (Signed)
[facility] [address] [place]. [place] [address] [phone]       OFFICE CONSULT NOTE  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 He took a shower and was unable to read from bible at night prior to bed.He slept well and woke up fine. 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 He had CT head which was unremarkable as well as carotid dopplers which were fine as well. He has not had recent lipid profile or HbA1c checked.He has a strong f/h/o stroke in dad, paternal uncles x2 and grandmother.  ROS:   14 system review of systems is positive for hearing loss,ringing in ears,allergies, impotence, confusion, memory loss  PMH:  Past Medical History  Diagnosis Date  . Hypertension   . Hypercholesteremia   . Heart murmur   . Benign prostatic hypertrophy   . GERD (gastroesophageal reflux disease)   . History of melanoma excision     FACE  . Perianal fistula   . S/P CABG x 4     19 88  . S/P drug eluting coronary stent placement     POST CABG--  STENTING 2000  AND RESTENTING 2006 FOR REINSTENT STENOSIS  . Coronary artery disease CARDIOLOGIST -- DR Verdis Prime  . Allergic rhinitis   . H/O hiatal hernia   . Mild mitral regurgitation   . Left carotid stenosis      > 50%  PER DUPLEX  03-28-2011    Social History:  History   Social History  . Marital Status: Married    Spouse Name: Delorise Shiner    Number of Children: 3  . Years of Education: B.D.   Occupational History  . retired    Social History Main Topics  . Smoking status: Never Smoker   . Smokeless tobacco: Never Used  . Alcohol Use: No  . Drug Use: No  . Sexual Activity: No   Other Topics Concern  . Not on file   Social History Narrative  . No narrative on file    Medications:   Current Outpatient Prescriptions on File Prior to Visit  Medication Sig Dispense Refill  . atenolol (TENORMIN) 25 MG tablet Take 25 mg by mouth daily after breakfast.      . atorvastatin (LIPITOR) 20 MG tablet Take 20 mg by mouth at bedtime.      . docusate sodium (COLACE) 100 MG capsule Take 100 mg by mouth 2 (two) times daily.      . nitroGLYCERIN (NITROSTAT) 0.4 MG SL tablet Place 0.4 mg under the tongue every 5 (five) minutes as needed.      . polyethylene glycol (MIRALAX / GLYCOLAX) packet Take 17 g by mouth daily.      . sodium chloride (OCEAN) 0.65 % nasal  spray Place 1-4 sprays into the nose 2 (two) times daily.      Marland Kitchen aspirin 325 MG EC tablet Take 325 mg by mouth daily after breakfast.       No current facility-administered medications on file prior to visit.    Allergies:   Allergies  Allergen Reactions  . Iodine Rash    Rash when applied to skin  . Niacin And Related Hives and Other (See Comments)    FLUSHING  . Adhesive [Tape] Rash    Physical Exam General: well developed, well nourished elderly Caucasian male, seated, in no evident distress Head: head normocephalic and atraumatic. Orohparynx benign Neck: supple with no carotid or supraclavicular bruits Cardiovascular: regular rate and rhythm, no murmurs Musculoskeletal: no deformity Skin:  no rash/petichiae Vascular:  Normal pulses all extremities Filed Vitals:   12/14/12 1257  BP: 163/90  Pulse: 60    Neurologic  Exam Mental Status: Awake and fully alert. Oriented to place and time. Recent and remote memory intact. Attention span, concentration and fund of knowledge appropriate. Mood and affect appropriate. MMSE 29/30. Recall 2/3.Animal Naming Test 16.Myrtis Ser index of ADLs 6.Marland KitchenIADL 8.NPI-Q 0. Cranial Nerves: Fundoscopic exam reveals sharp disc margins. Pupils equal, briskly reactive to light. Extraocular movements full without nystagmus. Visual fields full to confrontation. Hearing intact. Facial sensation intact. Face, tongue, palate moves normally and symmetrically.  Motor: Normal bulk and tone. Normal strength in all tested extremity muscles. Sensory.: intact to touch and pinprick and vibratory sensation.  Coordination: Rapid alternating movements normal in all extremities. Finger-to-nose and heel-to-shin performed accurately bilaterally. Gait and Station: Arises from chair without difficulty. Stance is normal. Gait demonstrates normal stride length and balance . Able to heel, toe and tandem walk without difficulty.  Reflexes: 1+ and symmetric. Toes downgoing.   NIHSS  0 Modified Rankin  0  ASSESSMENT: 79 year male with transient episode of confusion/disorientation of unclear etiology. Normal exam and CT head. Possibilities include TIA versus postictal state.    PLAN: He was advised to continue Plavix for secondary stroke prevention and strict control of hypertension with blood pressure goal below 130/90 and lipids with LDL cholesterol goal below 100 mg percent. Check MRI scan of the brain, fasting lipid profile, and Hb A1c, MRA of the brain, transthoracic echocardiogram and EEG. Return for followup in 6 weeks or call earlier if necessary.

## 2012-12-14 NOTE — Patient Instructions (Signed)
He was advised to continue Plavix for secondary stroke prevention and strict control of hypertension with blood pressure goal below 130/90 and lipids with LDL cholesterol goal below 100 mg percent. Check MRI scan of the brain, fasting lipid profile, and Hb A1c, MRA of the brain, transthoracic echocardiogram and EEG. Return for followup in 6 weeks or call earlier if necessary.

## 2012-12-16 ENCOUNTER — Other Ambulatory Visit (INDEPENDENT_AMBULATORY_CARE_PROVIDER_SITE_OTHER): Payer: Self-pay

## 2012-12-16 ENCOUNTER — Other Ambulatory Visit: Payer: Medicare Other | Admitting: Radiology

## 2012-12-16 ENCOUNTER — Other Ambulatory Visit: Payer: Self-pay | Admitting: *Deleted

## 2012-12-16 DIAGNOSIS — E1149 Type 2 diabetes mellitus with other diabetic neurological complication: Secondary | ICD-10-CM | POA: Diagnosis not present

## 2012-12-16 DIAGNOSIS — I6350 Cerebral infarction due to unspecified occlusion or stenosis of unspecified cerebral artery: Secondary | ICD-10-CM

## 2012-12-16 DIAGNOSIS — Z0289 Encounter for other administrative examinations: Secondary | ICD-10-CM

## 2012-12-16 DIAGNOSIS — E782 Mixed hyperlipidemia: Secondary | ICD-10-CM | POA: Diagnosis not present

## 2012-12-16 NOTE — Addendum Note (Signed)
Addended byHermenia Fiscal on: 12/16/2012 09:10 AM   Modules accepted: Orders

## 2012-12-17 ENCOUNTER — Telehealth: Payer: Self-pay | Admitting: *Deleted

## 2012-12-17 LAB — LIPID PANEL
Chol/HDL Ratio: 3 ratio units (ref 0.0–5.0)
Cholesterol, Total: 138 mg/dL (ref 100–199)
LDL Calculated: 75 mg/dL (ref 0–99)
VLDL Cholesterol Cal: 17 mg/dL (ref 5–40)

## 2012-12-20 ENCOUNTER — Emergency Department (HOSPITAL_COMMUNITY)
Admission: EM | Admit: 2012-12-20 | Discharge: 2012-12-20 | Disposition: A | Discharge status: Home / Self Care | Payer: Medicare Other | Attending: Emergency Medicine | Admitting: Emergency Medicine

## 2012-12-20 ENCOUNTER — Encounter (HOSPITAL_COMMUNITY): Payer: Self-pay | Admitting: Emergency Medicine

## 2012-12-20 DIAGNOSIS — I2510 Atherosclerotic heart disease of native coronary artery without angina pectoris: Secondary | ICD-10-CM | POA: Diagnosis not present

## 2012-12-20 DIAGNOSIS — R011 Cardiac murmur, unspecified: Secondary | ICD-10-CM | POA: Insufficient documentation

## 2012-12-20 DIAGNOSIS — Z85820 Personal history of malignant melanoma of skin: Secondary | ICD-10-CM | POA: Diagnosis not present

## 2012-12-20 DIAGNOSIS — I10 Essential (primary) hypertension: Secondary | ICD-10-CM | POA: Insufficient documentation

## 2012-12-20 DIAGNOSIS — Z7982 Long term (current) use of aspirin: Secondary | ICD-10-CM | POA: Insufficient documentation

## 2012-12-20 DIAGNOSIS — I82819 Embolism and thrombosis of superficial veins of unspecified lower extremities: Secondary | ICD-10-CM | POA: Diagnosis not present

## 2012-12-20 DIAGNOSIS — Z7902 Long term (current) use of antithrombotics/antiplatelets: Secondary | ICD-10-CM | POA: Insufficient documentation

## 2012-12-20 DIAGNOSIS — I82812 Embolism and thrombosis of superficial veins of left lower extremities: Secondary | ICD-10-CM

## 2012-12-20 DIAGNOSIS — M7989 Other specified soft tissue disorders: Secondary | ICD-10-CM | POA: Diagnosis not present

## 2012-12-20 DIAGNOSIS — Z79899 Other long term (current) drug therapy: Secondary | ICD-10-CM | POA: Insufficient documentation

## 2012-12-20 DIAGNOSIS — K219 Gastro-esophageal reflux disease without esophagitis: Secondary | ICD-10-CM | POA: Insufficient documentation

## 2012-12-20 DIAGNOSIS — E7800 Pure hypercholesterolemia, unspecified: Secondary | ICD-10-CM | POA: Diagnosis not present

## 2012-12-20 DIAGNOSIS — Z87448 Personal history of other diseases of urinary system: Secondary | ICD-10-CM | POA: Diagnosis not present

## 2012-12-20 DIAGNOSIS — Z951 Presence of aortocoronary bypass graft: Secondary | ICD-10-CM | POA: Diagnosis not present

## 2012-12-20 DIAGNOSIS — M79609 Pain in unspecified limb: Secondary | ICD-10-CM | POA: Diagnosis not present

## 2012-12-20 LAB — BASIC METABOLIC PANEL
BUN: 12 mg/dL (ref 6–23)
Calcium: 9.3 mg/dL (ref 8.4–10.5)
Chloride: 97 mEq/L (ref 96–112)
GFR calc Af Amer: 88 mL/min — ABNORMAL LOW (ref >90)
GFR calc non Af Amer: 76 mL/min — ABNORMAL LOW (ref >90)
Potassium: 4.4 mEq/L (ref 3.5–5.1)
Sodium: 134 mEq/L — ABNORMAL LOW (ref 135–145)

## 2012-12-20 LAB — CBC WITH DIFFERENTIAL/PLATELET
Basophils Absolute: 0 10*3/uL (ref 0.0–0.1)
Basophils Relative: 1 % (ref 0–1)
Eosinophils Absolute: 0.1 10*3/uL (ref 0.0–0.7)
MCH: 31.9 pg (ref 26.0–34.0)
MCHC: 35.1 g/dL (ref 30.0–36.0)
Monocytes Relative: 9 % (ref 3–12)
Neutrophils Relative %: 62 % (ref 43–77)
Platelets: 199 10*3/uL (ref 150–400)
WBC: 6.2 10*3/uL (ref 4.0–10.5)

## 2012-12-20 NOTE — ED Provider Notes (Signed)
Medical screening examination/treatment/procedure(s) were conducted as a shared visit with non-physician practitioner(s) and myself.  I personally evaluated the patient during the encounter.  EKG Interpretation   None      79 year old male who presents with left lower extremity pain. He reports tightness when he walks and pain on palpation. He is otherwise nontoxic-appearing.  He denies chest pain or shortness of breath. He has no history of clot. Physical exam is notable for varicose veins of the left lower extremity and an ecchymosis on the medial aspect of the left calf. Patient denies any injury. Doppler ultrasound negative for DVT but does show superficial venous thrombosis. There is no deformity and have low suspicion for fracture.  Patient will be discharged home.  After history, exam, and medical workup I feel the patient has been appropriately medically screened and is safe for discharge home. Pertinent diagnoses were discussed with the patient. Patient was given return precautions.    Shon Baton, MD 12/20/12 [phone]

## 2012-12-20 NOTE — ED Provider Notes (Signed)
CSN: [id number]     Arrival date & time 12/20/12  1227 History   First MD Initiated Contact with Patient 12/20/12 1236     Chief Complaint  Patient presents with  . Leg Pain   (Consider location/radiation/quality/duration/timing/severity/associated sxs/prior Treatment) The history is provided by the patient and medical records.   This is an 79 year old male with past medical history significant for hypertension, hyperlipidemia, CAD, presenting to the ED for left lower extremity pain and swelling. Patient states on Friday he noticed 2 small "knots" in his left calf. States they are painful to palpation. Patient states when he walks, his left calf feels very tight but no difficulty walking. Denies any numbness or paresthesias of LLE.  Patient is status post CABG with drug-eluting stent. Was recently started on Plavix in additional to his ASA following TIA in mid-October. Denies history of DVT or PE.  Denies any chest pain, shortness of breath, or palpitations.  Pt is scheduled to have EEG, MRI, and echo performed as part of his OP work-up.  Wife notes he walks several miles daily for exercise.  Past Medical History  Diagnosis Date  . Hypertension   . Hypercholesteremia   . Heart murmur   . Benign prostatic hypertrophy   . GERD (gastroesophageal reflux disease)   . History of melanoma excision     FACE  . Perianal fistula   . S/P CABG x 4     1988  . S/P drug eluting coronary stent placement     POST CABG--  STENTING 2000  AND RESTENTING 2006 FOR REINSTENT STENOSIS  . Coronary artery disease CARDIOLOGIST -- DR Verdis Prime  . Allergic rhinitis   . H/O hiatal hernia   . Mild mitral regurgitation   . Left carotid stenosis     > 50%  PER DUPLEX  03-28-2011   Past Surgical History  Procedure Laterality Date  . Transurethral resection of prostate  04/09/2011    Procedure: TRANSURETHRAL RESECTION OF THE PROSTATE WITH GYRUS INSTRUMENTS;  Surgeon: Anner Crete, MD;  Location: [facility];  Service:  Urology;  Laterality: N/A;  . Cataract extraction w/ intraocular lens  implant, bilateral    . Tonsillectomy and adenoidectomy  1944  . Shoulder arthroscopy with open rotator cuff repair Right 07-10-1999  . Coronary angioplasty with stent placement  2000    PCI AND STENTING CIRCUMFLEX  . Coronary angioplasty with stent placement  04-30-2004  DR Verdis Prime    RE-INSTENT STENOSIS/  DRUG-ELUTING STENT OF PROXIMAL AND MID CIRCUMFLEX/ PCI MID CIRCUMFLEX THROUGH STENT STRUT/  WIDELY PATENT SAPHENOUS VEIN GRAFT AND  LIMA TO LAD GRAFT/ TOTAL OCCLUSION RIGHT CORONARY BEYOND THE POSTERIOR DESCENDING ARTERY BRANCH WITH 50% OSTIAL NARROWING/ TOTAL OCCLUSION OF LAD AT THE OSTIUM OF THE LEFT MAIN  . Coronary artery bypass graft  1988    X5  . Transthoracic echocardiogram  03-01-2011  DR Verdis Prime    NORMAL LVF AND LV SIZE/ MILD LEFT ATRIAL ENLARGEMENT/ GRADE II DIASTOLIC DYSFUNCTION WITH ELEVATED LEFT ATRIAL PRESSURE/ MILD TO MODERATE MR/ MILD TR  . Evaluation under anesthesia with anal fistulectomy N/A 07/22/2012    Procedure: EXAM UNDER ANESTHESIA WITH ANAL fistulotomy;  Surgeon: Romie Levee, MD;  Location: [facility];  Service: General;  Laterality: N/A;   Family History  Problem Relation Age of Onset  . Cancer Mother     breast  . Cancer Brother     colon   History  Substance Use Topics  . Smoking  status: Never Smoker   . Smokeless tobacco: Never Used  . Alcohol Use: No    Review of Systems  Cardiovascular: Positive for leg swelling.  All other systems reviewed and are negative.    Allergies  Iodine; Niacin and related; and Adhesive  Home Medications   Current Outpatient Rx  Name  Route  Sig  Dispense  Refill  . aspirin 325 MG EC tablet   Oral   Take 325 mg by mouth daily after breakfast.         . atenolol (TENORMIN) 25 MG tablet   Oral   Take 25 mg by mouth daily after breakfast.         . atorvastatin (LIPITOR) 20 MG tablet   Oral   Take 20 mg by  mouth at bedtime.         . clopidogrel (PLAVIX) 75 MG tablet               . docusate sodium (COLACE) 100 MG capsule   Oral   Take 100 mg by mouth 2 (two) times daily.         . lansoprazole (PREVACID) 15 MG capsule               . nitroGLYCERIN (NITROSTAT) 0.4 MG SL tablet   Sublingual   Place 0.4 mg under the tongue every 5 (five) minutes as needed.         . polyethylene glycol (MIRALAX / GLYCOLAX) packet   Oral   Take 17 g by mouth daily.         . sodium chloride (OCEAN) 0.65 % nasal spray   Nasal   Place 1-4 sprays into the nose 2 (two) times daily.          BP 192/88  Pulse 58  Temp(Src) 97.7 F (36.5 C) (Oral)  Resp 20  Wt 169 lb 6.4 oz (76.839 kg)  SpO2 100%  Physical Exam  Nursing note and vitals reviewed. Constitutional: He is oriented to person, place, and time. He appears well-developed and well-nourished. No distress.  HENT:  Head: Normocephalic and atraumatic.  Mouth/Throat: Oropharynx is clear and moist.  Eyes: Conjunctivae and EOM are normal. Pupils are equal, round, and reactive to light.  Neck: Normal range of motion. Neck supple.  Cardiovascular: Normal rate, regular rhythm and normal heart sounds.   Pulmonary/Chest: Effort normal and breath sounds normal. No respiratory distress. He has no wheezes.  Musculoskeletal: Normal range of motion.  Bilateral lower extremity variscosities, L > R; small palpable bulge in mid-calf that is TTP; some bruising noted to left distal calf and ankle; no deformity; no gross calf asymmetry, overlying skin non-erythematous; strong distal pulse and cap refill; sensation intact  Neurological: He is alert and oriented to person, place, and time.  Skin: Skin is warm and dry. He is not diaphoretic.  Psychiatric: He has a normal mood and affect.    ED Course  Procedures (including critical care time) Labs Review Labs Reviewed  BASIC METABOLIC PANEL - Abnormal; Notable for the following:    Sodium 134  (*)    GFR calc non Af Amer 76 (*)    GFR calc Af Amer 88 (*)    All other components within normal limits  CBC WITH DIFFERENTIAL   Imaging Review No results found.  Preliminary report: There is no DVT noted in the left lower extremity. There is a superficial thrombus noted in a varicose vein in the left calf.  EKG Interpretation  None       MDM   1. Acute superficial venous thrombosis of lower extremity, left    Labs as above.  LLE duplex negative for DVT but superficial thrombus in varicose vein.  BP has improved in the ED without intervention, now 173/88.  All other VS have remained stable, no signs/sx concerning for PE.  Instructed to continue taking plavix as directed.  Pt has several previously scheduled FU appts this upcoming week.  Instructed to monitor sx and return to the ED for any worsening or concerning sx including increased redness, swelling, warmth to touch, numbness/paresthesias/weakness of extremities. Discussed plan with pt and wife, they agreed.  Return precautions advised.  Garlon Hatchet, PA-C 12/20/12 [phone]

## 2012-12-20 NOTE — ED Notes (Signed)
Pt reports he is here today because he noticed lumps under skin of L calf 2 days ago that are painful when he touches them. Pt was recently started on plavix for stroke treatment. Pt is also HTN today and he was unaware, states his BP "has been normal at home."

## 2012-12-20 NOTE — Progress Notes (Signed)
VASCULAR LAB PRELIMINARY  PRELIMINARY  PRELIMINARY  PRELIMINARY  Left lower extremity venous Doppler completed.    Preliminary report:  There is no DVT noted in the left lower extremity.  There is a superficial thrombus noted in a varicose vein in the left calf.  Tess Potts, RVT 12/20/2012, 1:32 PM

## 2012-12-21 ENCOUNTER — Ambulatory Visit (HOSPITAL_COMMUNITY): Payer: Medicare Other | Attending: Cardiology | Admitting: Radiology

## 2012-12-21 ENCOUNTER — Other Ambulatory Visit (HOSPITAL_COMMUNITY): Payer: Medicare Other

## 2012-12-21 ENCOUNTER — Encounter: Payer: Self-pay | Admitting: Cardiology

## 2012-12-21 ENCOUNTER — Other Ambulatory Visit (INDEPENDENT_AMBULATORY_CARE_PROVIDER_SITE_OTHER): Payer: Medicare Other

## 2012-12-21 ENCOUNTER — Telehealth: Payer: Self-pay | Admitting: Neurology

## 2012-12-21 DIAGNOSIS — R6889 Other general symptoms and signs: Secondary | ICD-10-CM | POA: Diagnosis not present

## 2012-12-21 DIAGNOSIS — I6789 Other cerebrovascular disease: Secondary | ICD-10-CM

## 2012-12-21 DIAGNOSIS — R4182 Altered mental status, unspecified: Secondary | ICD-10-CM | POA: Insufficient documentation

## 2012-12-21 DIAGNOSIS — I079 Rheumatic tricuspid valve disease, unspecified: Secondary | ICD-10-CM | POA: Insufficient documentation

## 2012-12-21 DIAGNOSIS — I10 Essential (primary) hypertension: Secondary | ICD-10-CM | POA: Insufficient documentation

## 2012-12-21 DIAGNOSIS — I2510 Atherosclerotic heart disease of native coronary artery without angina pectoris: Secondary | ICD-10-CM | POA: Insufficient documentation

## 2012-12-21 DIAGNOSIS — E119 Type 2 diabetes mellitus without complications: Secondary | ICD-10-CM | POA: Diagnosis not present

## 2012-12-21 DIAGNOSIS — E785 Hyperlipidemia, unspecified: Secondary | ICD-10-CM | POA: Diagnosis not present

## 2012-12-21 DIAGNOSIS — I639 Cerebral infarction, unspecified: Secondary | ICD-10-CM

## 2012-12-21 DIAGNOSIS — I6350 Cerebral infarction due to unspecified occlusion or stenosis of unspecified cerebral artery: Secondary | ICD-10-CM | POA: Diagnosis not present

## 2012-12-21 DIAGNOSIS — F05 Delirium due to known physiological condition: Secondary | ICD-10-CM | POA: Diagnosis not present

## 2012-12-21 NOTE — Progress Notes (Signed)
Echocardiogram performed.  

## 2012-12-22 NOTE — Telephone Encounter (Signed)
Called patient and informed that EEG results has not been reviewed by the physician as of yet and that the dobbler was not done in our office, patient understood

## 2012-12-23 DIAGNOSIS — I8390 Asymptomatic varicose veins of unspecified lower extremity: Secondary | ICD-10-CM | POA: Diagnosis not present

## 2012-12-23 DIAGNOSIS — I808 Phlebitis and thrombophlebitis of other sites: Secondary | ICD-10-CM | POA: Diagnosis not present

## 2012-12-23 DIAGNOSIS — I10 Essential (primary) hypertension: Secondary | ICD-10-CM | POA: Diagnosis not present

## 2012-12-24 DIAGNOSIS — L821 Other seborrheic keratosis: Secondary | ICD-10-CM | POA: Diagnosis not present

## 2012-12-24 DIAGNOSIS — Z85828 Personal history of other malignant neoplasm of skin: Secondary | ICD-10-CM | POA: Diagnosis not present

## 2012-12-24 DIAGNOSIS — Z85820 Personal history of malignant melanoma of skin: Secondary | ICD-10-CM | POA: Diagnosis not present

## 2012-12-24 DIAGNOSIS — L570 Actinic keratosis: Secondary | ICD-10-CM | POA: Diagnosis not present

## 2012-12-24 NOTE — Telephone Encounter (Signed)
Pt was called by Dr. Pearlean Brownie and results of tests given to pt by Dr. Pearlean Brownie. 12-22-12.

## 2012-12-29 DIAGNOSIS — H52209 Unspecified astigmatism, unspecified eye: Secondary | ICD-10-CM | POA: Diagnosis not present

## 2012-12-29 DIAGNOSIS — Z961 Presence of intraocular lens: Secondary | ICD-10-CM | POA: Diagnosis not present

## 2012-12-30 ENCOUNTER — Ambulatory Visit
Admission: RE | Admit: 2012-12-30 | Discharge: 2012-12-30 | Disposition: A | Discharge status: Home / Self Care | Payer: Medicare Other | Source: Ambulatory Visit | Attending: Neurology | Admitting: Neurology

## 2012-12-30 DIAGNOSIS — G459 Transient cerebral ischemic attack, unspecified: Secondary | ICD-10-CM

## 2012-12-30 DIAGNOSIS — I6350 Cerebral infarction due to unspecified occlusion or stenosis of unspecified cerebral artery: Secondary | ICD-10-CM | POA: Diagnosis not present

## 2012-12-30 DIAGNOSIS — I639 Cerebral infarction, unspecified: Secondary | ICD-10-CM

## 2012-12-30 MED ORDER — GADOBENATE DIMEGLUMINE 529 MG/ML IV SOLN
16.0000 mL | Freq: Once | INTRAVENOUS | Status: AC | PRN
  Administered 2012-12-30: 16 mL via INTRAVENOUS

## 2012-12-31 ENCOUNTER — Other Ambulatory Visit (HOSPITAL_COMMUNITY): Payer: Medicare Other

## 2013-01-01 ENCOUNTER — Telehealth: Payer: Self-pay | Admitting: Neurology

## 2013-01-04 ENCOUNTER — Telehealth: Payer: Self-pay | Admitting: Neurology

## 2013-01-04 NOTE — Telephone Encounter (Signed)
Patient requesting MRI results

## 2013-01-04 NOTE — Telephone Encounter (Signed)
I called pt and relayed the results of MRA/MRI, Dr. Pearlean Brownie had given the results of EEG and TTE.  I gave results of labs.  Pt was given the ok to drive per Dr. Pearlean Brownie.  Pt has had no more episodes.

## 2013-01-04 NOTE — Telephone Encounter (Signed)
MRI brain shows mild changes of hardening of arteries an MRA of the brain shows no large vessel blockage or occlusion. No reason to worry. Keep followup appointment

## 2013-01-04 NOTE — Telephone Encounter (Signed)
I called the patient and discussed results of lab work and MRI scan and said that he could drive.

## 2013-01-14 NOTE — Telephone Encounter (Signed)
Patient was called by Dr. Pearlean Brownie and he discussed scans.

## 2013-01-15 NOTE — Telephone Encounter (Signed)
Called patient about his MRI showing mild changes of hardening of arteries and MRA of the brain showing no large vessel blockage or occlusion. No reason to worry, per Dr. Pearlean Brownie. I advised the patient that if he has any other problems, questions or concerns to call the office and to keep follow up appt. Patient verbalized understanding.

## 2013-01-27 ENCOUNTER — Ambulatory Visit (INDEPENDENT_AMBULATORY_CARE_PROVIDER_SITE_OTHER): Payer: Medicare Other | Admitting: Neurology

## 2013-01-27 ENCOUNTER — Encounter (INDEPENDENT_AMBULATORY_CARE_PROVIDER_SITE_OTHER): Payer: Self-pay

## 2013-01-27 ENCOUNTER — Encounter: Payer: Self-pay | Admitting: Neurology

## 2013-01-27 VITALS — BP 151/71 | HR 59 | Ht 67.5 in | Wt 168.0 lb

## 2013-01-27 DIAGNOSIS — F05 Delirium due to known physiological condition: Secondary | ICD-10-CM

## 2013-01-27 NOTE — Progress Notes (Signed)
[facility] [address] [place]. [place] [address] [phone]       OFFICE FOLLOW UP VISIT NOTE  Mr. Facundo Allemand Date of Birth:  April 03, 1928 Medical Record Number:  [id number]   Referring MD:  Merlene Laughter  Reason for Referral:  confusion  HPI:  He returns for followup today after his consultation 660. States is doing well and has not had any further episodes of recurrent confusion or disorientation. He has undergone extensive diagnostic testing and is keen to discuss the results. EEG done on 12/22/38 and in awake and sleep states was within normal limits without definite acute features noted. 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 MRA of the brain showed no significant stenosis of the medium to large size intracranial vessels. Hypoplastic terminal left vertebral artery is a benign variant. Transthoracic echocardiogram dated 12/21/12 shows normal ejection fraction without cardiac source of embolism. Lab work done on 12/16/12 shows borderline hemoglobin A1c of 6.2%, lipid profile is normal. Carotid ultrasound done on 12/02/12 shows less than 50% bilateral carotid stenosis. 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 He took a shower and was unable to read from bible at night prior to bed.He slept well and woke up fine. 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 He had CT head which was unremarkable as well as carotid dopplers which were fine as well. He has not had recent lipid profile or HbA1c checked.He has a strong f/h/o stroke in dad, paternal uncles x2 and grandmother.  ROS:   14 system review of systems is positive for confusion, lecturing, drooling, hearing loss, ringing in the ears only and all other systems negative PMH:  Past Medical History  Diagnosis Date  . Hypertension   . Hypercholesteremia   . Heart murmur   . Benign prostatic hypertrophy   . GERD (gastroesophageal reflux disease)   . History of melanoma excision     FACE  . Perianal fistula   . S/P CABG x 4     19 88  . S/P drug eluting coronary stent placement     POST CABG--  STENTING 2000  AND RESTENTING 2006 FOR REINSTENT STENOSIS  . Coronary artery disease CARDIOLOGIST -- DR Verdis Prime  . Allergic rhinitis   . H/O hiatal hernia   . Mild mitral regurgitation   . Left carotid stenosis     > 50%  PER DUPLEX  03-28-2011    Social History:  History   Social History  . Marital Status: Married    Spouse Name: Delorise Shiner    Number of Children: 3  . Years of Education: B.D.   Occupational History  . retired  Social History Main Topics  . Smoking status: Never Smoker   . Smokeless tobacco: Never Used  . Alcohol Use: No  . Drug Use: No  . Sexual Activity: No   Other Topics Concern  . Not on file   Social History Narrative  . No narrative on file    Medications:   Current Outpatient Prescriptions on File Prior to Visit  Medication Sig Dispense Refill  . atenolol (TENORMIN) 25 MG tablet Take 25 mg by mouth daily after breakfast.      . atorvastatin (LIPITOR) 20 MG tablet Take 20 mg by mouth at bedtime.      . calcium carbonate (TUMS -  DOSED IN MG ELEMENTAL CALCIUM) 500 MG chewable tablet Chew 1 tablet by mouth daily as needed for indigestion or heartburn.      . clopidogrel (PLAVIX) 75 MG tablet Take 75 mg by mouth daily.       Marland Kitchen docusate sodium (COLACE) 100 MG capsule Take 200 mg by mouth 2 (two) times daily.       . lansoprazole (PREVACID) 15 MG capsule Take 15 mg by mouth daily as needed (for heartburn).       . nitroGLYCERIN (NITROSTAT) 0.4 MG SL tablet Place 0.4 mg under the tongue every 5 (five) minutes as needed.      Marland Kitchen oxymetazoline (AFRIN) 0.05 % nasal spray Place 1 spray into both nostrils daily.      . polyethylene glycol (MIRALAX / GLYCOLAX) packet Take 17 g by mouth daily.       No current facility-administered medications on file prior to visit.    Allergies:   Allergies  Allergen Reactions  . Iodine Rash    Rash when applied to skin  . Niacin And Related Hives and Other (See Comments)    FLUSHING  . Adhesive [Tape] Rash    Physical Exam General: well developed, well nourished elderly Caucasian male, seated, in no evident distress Head: head normocephalic and atraumatic. Orohparynx benign Neck: supple with no carotid or supraclavicular bruits Cardiovascular: regular rate and rhythm, no murmurs Musculoskeletal: no deformity Skin:  no rash/petichiae Vascular:  Normal pulses all extremities Filed Vitals:   01/27/13 1508  BP: 151/71  Pulse: 59    Neurologic Exam Mental Status: Awake and fully alert. Oriented to place and time. Recent and remote memory intact. Attention span, concentration and fund of knowledge appropriate. Mood and affect appropriate. Recall 2/3. Animal Naming 12. Cranial Nerves: Fundoscopic exam reveals sharp disc margins. Pupils equal, briskly reactive to light. Extraocular movements full without nystagmus. Visual fields full to confrontation. Hearing intact. Facial sensation intact. Face, tongue, palate moves normally and symmetrically.  Motor: Normal bulk and tone. Normal  strength in all tested extremity muscles. Sensory.: intact to touch and pinprick and vibratory sensation.  Coordination: Rapid alternating movements normal in all extremities. Finger-to-nose and heel-to-shin performed accurately bilaterally. Gait and Station: Arises from chair without difficulty. Stance is normal. Gait demonstrates normal stride length and balance . Able to heel, toe and tandem walk without difficulty.  Reflexes: 1+ and symmetric. Toes downgoing.   NIHSS  0 Modified Rankin  0  ASSESSMENT: 79 year male with transient episode of confusion/disorientation of unclear etiology. Normal exam imaging and vascular evaluation. Possibilities include TIA versus postictal state.    PLAN: I had a long discussion with the patient with regards to his episode and went over the results of the diagnostic testing and answered questions. Continue Plavix for stroke prevention and strict control of  lipids with LDL cholesterol goal below 100 mg percent and hypertension with blood pressure goal below 130/90. Return for followup in 3 months or call earlier if necessary.

## 2013-01-27 NOTE — Patient Instructions (Signed)
Continue Plavix for stroke prevention and strict control of lipids with LDL cholesterol goal below 100 mg percent and hypertension with blood pressure goal below 130/90. Return for followup in 3 months or call earlier if necessary.

## 2013-02-08 DIAGNOSIS — I10 Essential (primary) hypertension: Secondary | ICD-10-CM | POA: Diagnosis not present

## 2013-02-08 DIAGNOSIS — Z8673 Personal history of transient ischemic attack (TIA), and cerebral infarction without residual deficits: Secondary | ICD-10-CM | POA: Diagnosis not present

## 2013-05-04 ENCOUNTER — Encounter (INDEPENDENT_AMBULATORY_CARE_PROVIDER_SITE_OTHER): Payer: Self-pay

## 2013-05-04 ENCOUNTER — Ambulatory Visit: Payer: Medicare Other | Admitting: Neurology

## 2013-05-04 ENCOUNTER — Telehealth: Payer: Self-pay | Admitting: Neurology

## 2013-05-04 DIAGNOSIS — H04229 Epiphora due to insufficient drainage, unspecified lacrimal gland: Secondary | ICD-10-CM | POA: Diagnosis not present

## 2013-05-04 DIAGNOSIS — H01009 Unspecified blepharitis unspecified eye, unspecified eyelid: Secondary | ICD-10-CM | POA: Diagnosis not present

## 2013-05-04 DIAGNOSIS — H04569 Stenosis of unspecified lacrimal punctum: Secondary | ICD-10-CM | POA: Diagnosis not present

## 2013-05-04 NOTE — Telephone Encounter (Signed)
Gave information to Dr. Leonie Man assistant, Sharmon Revere.

## 2013-05-04 NOTE — Telephone Encounter (Signed)
FYI

## 2013-05-04 NOTE — Telephone Encounter (Signed)
Called patient and his wife picked up, and stated her husband can be impatient and has memory problems. I asked her could we reschedule an appointment  She stated she don't think he will come back..  I let her know we are always willing to see him and if he is having any problems to please call.

## 2013-05-04 NOTE — Telephone Encounter (Signed)
Kindly reschedule next avialble

## 2013-05-04 NOTE — Telephone Encounter (Signed)
Jason Velazquez left without being seen 05/04/2013 due to the long wait and not being seen on time and had another appointment to get to.  He stated he waited 45 minutes the prior visit without being seen. Patient was polite but visibly upset.

## 2013-05-10 DIAGNOSIS — E7800 Pure hypercholesterolemia, unspecified: Secondary | ICD-10-CM | POA: Diagnosis not present

## 2013-05-10 DIAGNOSIS — Z1331 Encounter for screening for depression: Secondary | ICD-10-CM | POA: Diagnosis not present

## 2013-05-10 DIAGNOSIS — Z23 Encounter for immunization: Secondary | ICD-10-CM | POA: Diagnosis not present

## 2013-05-10 DIAGNOSIS — I10 Essential (primary) hypertension: Secondary | ICD-10-CM | POA: Diagnosis not present

## 2013-05-10 DIAGNOSIS — Z0000 Encounter for general adult medical examination without abnormal findings: Secondary | ICD-10-CM | POA: Diagnosis not present

## 2013-05-10 DIAGNOSIS — Z79899 Other long term (current) drug therapy: Secondary | ICD-10-CM | POA: Diagnosis not present

## 2013-05-10 DIAGNOSIS — K219 Gastro-esophageal reflux disease without esophagitis: Secondary | ICD-10-CM | POA: Diagnosis not present

## 2013-05-11 DIAGNOSIS — I10 Essential (primary) hypertension: Secondary | ICD-10-CM | POA: Diagnosis not present

## 2013-05-11 DIAGNOSIS — Z79899 Other long term (current) drug therapy: Secondary | ICD-10-CM | POA: Diagnosis not present

## 2013-05-11 DIAGNOSIS — E7800 Pure hypercholesterolemia, unspecified: Secondary | ICD-10-CM | POA: Diagnosis not present

## 2013-05-26 ENCOUNTER — Emergency Department (HOSPITAL_COMMUNITY): Payer: Medicare Other

## 2013-05-26 ENCOUNTER — Inpatient Hospital Stay (HOSPITAL_COMMUNITY)
Admission: EM | Admit: 2013-05-26 | Discharge: 2013-05-27 | DRG: 066 | Disposition: A | Discharge status: Home / Self Care | Payer: Medicare Other | Attending: Internal Medicine | Admitting: Internal Medicine

## 2013-05-26 ENCOUNTER — Encounter (HOSPITAL_COMMUNITY): Payer: Self-pay | Admitting: Emergency Medicine

## 2013-05-26 DIAGNOSIS — I25810 Atherosclerosis of coronary artery bypass graft(s) without angina pectoris: Secondary | ICD-10-CM

## 2013-05-26 DIAGNOSIS — I739 Peripheral vascular disease, unspecified: Secondary | ICD-10-CM | POA: Diagnosis present

## 2013-05-26 DIAGNOSIS — I25118 Atherosclerotic heart disease of native coronary artery with other forms of angina pectoris: Secondary | ICD-10-CM

## 2013-05-26 DIAGNOSIS — I6330 Cerebral infarction due to thrombosis of unspecified cerebral artery: Principal | ICD-10-CM | POA: Diagnosis present

## 2013-05-26 DIAGNOSIS — Z85820 Personal history of malignant melanoma of skin: Secondary | ICD-10-CM | POA: Diagnosis not present

## 2013-05-26 DIAGNOSIS — I6350 Cerebral infarction due to unspecified occlusion or stenosis of unspecified cerebral artery: Secondary | ICD-10-CM | POA: Diagnosis not present

## 2013-05-26 DIAGNOSIS — R209 Unspecified disturbances of skin sensation: Secondary | ICD-10-CM | POA: Diagnosis not present

## 2013-05-26 DIAGNOSIS — I639 Cerebral infarction, unspecified: Secondary | ICD-10-CM | POA: Diagnosis present

## 2013-05-26 DIAGNOSIS — I517 Cardiomegaly: Secondary | ICD-10-CM | POA: Diagnosis not present

## 2013-05-26 DIAGNOSIS — I10 Essential (primary) hypertension: Secondary | ICD-10-CM | POA: Diagnosis present

## 2013-05-26 DIAGNOSIS — E7800 Pure hypercholesterolemia, unspecified: Secondary | ICD-10-CM | POA: Diagnosis present

## 2013-05-26 DIAGNOSIS — N400 Enlarged prostate without lower urinary tract symptoms: Secondary | ICD-10-CM | POA: Diagnosis present

## 2013-05-26 DIAGNOSIS — I2510 Atherosclerotic heart disease of native coronary artery without angina pectoris: Secondary | ICD-10-CM | POA: Diagnosis not present

## 2013-05-26 DIAGNOSIS — Z823 Family history of stroke: Secondary | ICD-10-CM | POA: Diagnosis not present

## 2013-05-26 DIAGNOSIS — Z951 Presence of aortocoronary bypass graft: Secondary | ICD-10-CM

## 2013-05-26 DIAGNOSIS — I6789 Other cerebrovascular disease: Secondary | ICD-10-CM | POA: Diagnosis not present

## 2013-05-26 DIAGNOSIS — K219 Gastro-esophageal reflux disease without esophagitis: Secondary | ICD-10-CM | POA: Diagnosis present

## 2013-05-26 DIAGNOSIS — E785 Hyperlipidemia, unspecified: Secondary | ICD-10-CM | POA: Diagnosis not present

## 2013-05-26 DIAGNOSIS — G459 Transient cerebral ischemic attack, unspecified: Secondary | ICD-10-CM | POA: Diagnosis not present

## 2013-05-26 DIAGNOSIS — Z9861 Coronary angioplasty status: Secondary | ICD-10-CM

## 2013-05-26 DIAGNOSIS — I25119 Atherosclerotic heart disease of native coronary artery with unspecified angina pectoris: Secondary | ICD-10-CM

## 2013-05-26 LAB — COMPREHENSIVE METABOLIC PANEL
ALBUMIN: 3.8 g/dL (ref 3.5–5.2)
ALK PHOS: 65 U/L (ref 39–117)
ALT: 23 U/L (ref 0–53)
AST: 24 U/L (ref 0–37)
BUN: 10 mg/dL (ref 6–23)
CALCIUM: 9.6 mg/dL (ref 8.4–10.5)
CO2: 28 mEq/L (ref 19–32)
Chloride: 99 mEq/L (ref 96–112)
Creatinine, Ser: 0.93 mg/dL (ref 0.50–1.35)
GFR calc Af Amer: 88 mL/min — ABNORMAL LOW (ref >90)
GFR calc non Af Amer: 76 mL/min — ABNORMAL LOW (ref >90)
Glucose, Bld: 97 mg/dL (ref 70–99)
Potassium: 5 mEq/L (ref 3.7–5.3)
SODIUM: 138 meq/L (ref 137–147)
TOTAL PROTEIN: 6.9 g/dL (ref 6.0–8.3)
Total Bilirubin: 0.7 mg/dL (ref 0.3–1.2)

## 2013-05-26 LAB — CBC
HCT: 43.3 % (ref 39.0–52.0)
Hemoglobin: 14.9 g/dL (ref 13.0–17.0)
MCH: 31.4 pg (ref 26.0–34.0)
MCHC: 34.4 g/dL (ref 30.0–36.0)
MCV: 91.4 fL (ref 78.0–100.0)
PLATELETS: 193 10*3/uL (ref 150–400)
RBC: 4.74 MIL/uL (ref 4.22–5.81)
RDW: 12.9 % (ref 11.5–15.5)
WBC: 5 10*3/uL (ref 4.0–10.5)

## 2013-05-26 LAB — RAPID URINE DRUG SCREEN, HOSP PERFORMED
Amphetamines: NOT DETECTED
BARBITURATES: NOT DETECTED
Benzodiazepines: NOT DETECTED
COCAINE: NOT DETECTED
Opiates: NOT DETECTED
TETRAHYDROCANNABINOL: NOT DETECTED

## 2013-05-26 LAB — I-STAT TROPONIN, ED: Troponin i, poc: 0 ng/mL (ref 0.00–0.08)

## 2013-05-26 MED ORDER — ASPIRIN 81 MG PO CHEW
324.0000 mg | CHEWABLE_TABLET | Freq: Once | ORAL | Status: AC
  Administered 2013-05-26: 324 mg via ORAL
  Filled 2013-05-26: qty 4

## 2013-05-26 MED ORDER — ATENOLOL 25 MG PO TABS
25.0000 mg | ORAL_TABLET | Freq: Every day | ORAL | Status: DC
  Administered 2013-05-27: 25 mg via ORAL
  Filled 2013-05-26 (×2): qty 1

## 2013-05-26 MED ORDER — STUDY - INVESTIGATIONAL DRUG SIMPLE RECORD
180.0000 mg | Freq: Once | Status: AC
  Administered 2013-05-26: 180 mg via ORAL
  Filled 2013-05-26: qty 180

## 2013-05-26 MED ORDER — HEPARIN SODIUM (PORCINE) 5000 UNIT/ML IJ SOLN
5000.0000 [IU] | Freq: Three times a day (TID) | INTRAMUSCULAR | Status: DC
  Administered 2013-05-26 – 2013-05-27 (×3): 5000 [IU] via SUBCUTANEOUS
  Filled 2013-05-26 (×5): qty 1

## 2013-05-26 MED ORDER — STUDY - INVESTIGATIONAL DRUG SIMPLE RECORD
90.0000 mg | Freq: Two times a day (BID) | Status: DC
  Administered 2013-05-27: 90 mg via ORAL
  Filled 2013-05-26 (×2): qty 90

## 2013-05-26 MED ORDER — ACETAMINOPHEN 650 MG RE SUPP: 650.0000 mg | Freq: Four times a day (QID) | RECTAL | Status: DC | PRN

## 2013-05-26 MED ORDER — ATORVASTATIN CALCIUM 20 MG PO TABS
20.0000 mg | ORAL_TABLET | Freq: Every day | ORAL | Status: DC
  Administered 2013-05-26: 20 mg via ORAL
  Filled 2013-05-26 (×3): qty 1

## 2013-05-26 MED ORDER — STUDY - INVESTIGATIONAL DRUG SIMPLE RECORD
100.0000 mg | Freq: Every day | Status: DC
  Administered 2013-05-27: 100 mg via ORAL
  Filled 2013-05-26 (×2): qty 100

## 2013-05-26 MED ORDER — ACETAMINOPHEN 325 MG PO TABS: 650.0000 mg | ORAL_TABLET | Freq: Four times a day (QID) | ORAL | Status: DC | PRN

## 2013-05-26 MED ORDER — SODIUM CHLORIDE 0.9 % IJ SOLN
3.0000 mL | Freq: Two times a day (BID) | INTRAMUSCULAR | Status: DC
  Administered 2013-05-26 – 2013-05-27 (×3): 3 mL via INTRAVENOUS

## 2013-05-26 MED ORDER — CLOPIDOGREL BISULFATE 75 MG PO TABS
75.0000 mg | ORAL_TABLET | Freq: Every day | ORAL | Status: DC
  Filled 2013-05-26: qty 1

## 2013-05-26 MED ORDER — ONDANSETRON HCL 4 MG PO TABS: 4.0000 mg | ORAL_TABLET | Freq: Four times a day (QID) | ORAL | Status: DC | PRN

## 2013-05-26 MED ORDER — PANTOPRAZOLE SODIUM 40 MG PO TBEC
40.0000 mg | DELAYED_RELEASE_TABLET | Freq: Every day | ORAL | Status: DC
  Administered 2013-05-26 – 2013-05-27 (×2): 40 mg via ORAL
  Filled 2013-05-26 (×2): qty 1

## 2013-05-26 MED ORDER — STUDY - INVESTIGATIONAL DRUG SIMPLE RECORD
300.0000 mg | Freq: Once | Status: AC
  Administered 2013-05-26: 300 mg via ORAL
  Filled 2013-05-26: qty 300

## 2013-05-26 MED ORDER — ONDANSETRON HCL 4 MG/2ML IJ SOLN: 4.0000 mg | Freq: Four times a day (QID) | INTRAMUSCULAR | Status: DC | PRN

## 2013-05-26 MED ORDER — MORPHINE SULFATE 2 MG/ML IJ SOLN: 2.0000 mg | INTRAMUSCULAR | Status: DC | PRN

## 2013-05-26 NOTE — ED Notes (Signed)
Ordered heart healthy diet for patient.  

## 2013-05-26 NOTE — ED Notes (Signed)
Family at bedside. 

## 2013-05-26 NOTE — ED Notes (Signed)
Returned from MRI, patient reports he is feeling better with no deficits at this time.  Wife at bedside.

## 2013-05-26 NOTE — ED Notes (Signed)
Pt arrived from home by [facility]. Around 0300 this morning pt got up to urinate as he does every morning around this time and noticed that his left arm had some numbness and tingling. Sensation lasted approximately 10 mins. When pt arrived he denied any pain, numbness or tingling. Pt then told wife who is a [employer] scientist and she thought it was best for him to get checked out.

## 2013-05-26 NOTE — Consult Note (Signed)
Neurology Consultation Reason for Consult: Stroke Referring Physician: Rhetta Mura  CC: left-sided numbness  History is obtained from:patient, wife and  HPI: Yan Pankratz is a 80 y.o. male who presents with left-sided numbness and discoordination that started last evening.he went to bed around 10 PM and then awoke at 3 AM with the symptoms present. He had some mild difficulty with walking to the bathroom.    LKW: 10 PM tpa given?: no, outside of window    ROS: A 14 point ROS was performed and is negative except as noted in the HPI.  Past Medical History  Diagnosis Date  . Hypertension   . Hypercholesteremia   . Heart murmur   . Benign prostatic hypertrophy   . GERD (gastroesophageal reflux disease)   . History of melanoma excision     FACE  . Perianal fistula   . S/P CABG x 4     1988  . S/P drug eluting coronary stent placement     POST CABG--  STENTING 2000  AND RESTENTING 2006 FOR REINSTENT STENOSIS  . Coronary artery disease CARDIOLOGIST -- DR Daneen Schick  . Allergic rhinitis   . H/O hiatal hernia   . Mild mitral regurgitation   . Left carotid stenosis     > 50%  PER DUPLEX  03-28-2011    Family History: Multiple family members with stroke  Social History: Tob: denies  Exam: Current vital signs: BP 159/82  Pulse 60  Temp(Src) 97.6 F (36.4 C) (Oral)  Resp 18  SpO2 100% Vital signs in last 24 hours: Temp:  [97.6 F (36.4 C)] 97.6 F (36.4 C) (04/15 0733) Pulse Rate:  [55-64] 60 (04/15 1345) Resp:  [11-21] 18 (04/15 1345) BP: (147-166)/(76-102) 159/82 mmHg (04/15 1345) SpO2:  [99 %-100 %] 100 % (04/15 1345)  General: in bed, NAD CV: regular rate and rhythm Mental Status: Patient is awake, alert, oriented to person, place, month, year, and situation. Immediate and remote memory are intact. Patient is able to give a clear and coherent history. No signs of aphasia or neglect Cranial Nerves: II: Visual Fields are full. Pupils are equal, round, and  reactive to light.  Discs are difficult to visualize. III,IV, VI: EOMI without ptosis or diploplia.  V: Facial sensation is symmetric to temperature VII: Facial movement is symmetric.  VIII: hearing is intact to voice X: Uvula elevates symmetrically XI: Shoulder shrug is symmetric. XII: tongue is midline without atrophy or fasciculations.  Motor: Tone is normal. Bulk is normal. 5/5 strength was present in all four extremities.  Sensory: Sensation is symmetric to light touch and temperature in the arms and legs. Deep Tendon Reflexes: 2+ and symmetric in the biceps and patellae.  Plantars: Toes are downgoing bilaterally.  Cerebellar: He has mild difficulty with finger-nose-finger on the left. Gait: Patient has a stable casual gait. Able to tandem, heel, and toe walk.   I have reviewed labs in epic and the results pertinent to this consultation are: CMP is unremarkable  I have reviewed the images obtained:MRI brain-small pontine infarct.  Impression: 80 year old male with new small pontine infarct. He'll need be admitted for small vessel risk factor management and PT.  Recommendations: 1. HgbA1c, fasting lipid panel 2. MRI, MRA  of the brain without contrast 3. Frequent neuro checks 4. Echocardiogram 5. Carotid dopplers 6. Prophylactic therapy-Antiplatelet med: Aspirin - dose 325mg  PO or 300mg  PR 7. Risk factor modification 8. Telemetry monitoring 9. PT consult, OT consult, Speech consult    Roland Rack,  MD [facility] [phone]  If 7pm- 7am, please page neurology on call as listed in [place].

## 2013-05-26 NOTE — ED Provider Notes (Signed)
CSN: [id number]     Arrival date & time 05/26/13  [id number] History   First MD Initiated Contact with Patient 05/26/13 [phone]     Chief Complaint  Patient presents with  . Left arm numbness/tingling      (Consider location/radiation/quality/duration/timing/severity/associated sxs/prior Treatment) HPI  80 year old male with history of TIA, CAD status post drug-eluting cardiac stent, and hypercholesterolemia who presents for evaluation of left arm numbness. Patient reports she woke up at 3 a.m. to use the bathroom and noticed that his left arm was tingling. He also reports feeling "wobbly" while walking to the bathroom. This episode lasting for about 10 minutes and resolved. He went back to sleep and woke up around 6 AM asking his wife if his symptoms were stroke related. His wife heard him to come to the ER for further evaluation. While in the ER room he notice tingling sensation to his left upper and lower lips. This sensation has been ongoing for the past 20 minutes. He denies having any fever, chills, headache, ringing in ears, neck pain, neck stiffness, chest pain, shortness of breath, productive cough, abdominal pain, nausea, vomiting, diarrhea, diaphoresis, difficulty speaking, difficulty thinking, or having any other complaints. He is currently not on any blood thinner medication except 325 mg aspirin daily which he did not take this morning.  Past Medical History  Diagnosis Date  . Hypertension   . Hypercholesteremia   . Heart murmur   . Benign prostatic hypertrophy   . GERD (gastroesophageal reflux disease)   . History of melanoma excision     FACE  . Perianal fistula   . S/P CABG x 4     1988  . S/P drug eluting coronary stent placement     POST CABG--  STENTING 2000  AND RESTENTING 2006 FOR REINSTENT STENOSIS  . Coronary artery disease CARDIOLOGIST -- DR Daneen Schick  . Allergic rhinitis   . H/O hiatal hernia   . Mild mitral regurgitation   . Left carotid stenosis     > 50%  PER  DUPLEX  03-28-2011   Past Surgical History  Procedure Laterality Date  . Transurethral resection of prostate  04/09/2011    Procedure: TRANSURETHRAL RESECTION OF THE PROSTATE WITH GYRUS INSTRUMENTS;  Surgeon: Malka So, MD;  Location: [facility];  Service: Urology;  Laterality: N/A;  . Cataract extraction w/ intraocular lens  implant, bilateral    . Tonsillectomy and adenoidectomy  1944  . Shoulder arthroscopy with open rotator cuff repair Right 07-10-1999  . Coronary angioplasty with stent placement  2000    PCI AND STENTING CIRCUMFLEX  . Coronary angioplasty with stent placement  04-30-2004  DR Daneen Schick    RE-INSTENT STENOSIS/  DRUG-ELUTING STENT OF PROXIMAL AND MID CIRCUMFLEX/ PCI MID CIRCUMFLEX THROUGH STENT STRUT/  WIDELY PATENT SAPHENOUS VEIN GRAFT AND  LIMA TO LAD GRAFT/ TOTAL OCCLUSION RIGHT CORONARY BEYOND THE POSTERIOR DESCENDING ARTERY BRANCH WITH 50% OSTIAL NARROWING/ TOTAL OCCLUSION OF LAD AT THE OSTIUM OF THE LEFT MAIN  . Coronary artery bypass graft  1988    X5  . Transthoracic echocardiogram  03-01-2011  DR Daneen Schick    NORMAL LVF AND LV SIZE/ MILD LEFT ATRIAL ENLARGEMENT/ GRADE II DIASTOLIC DYSFUNCTION WITH ELEVATED LEFT ATRIAL PRESSURE/ MILD TO MODERATE MR/ MILD TR  . Evaluation under anesthesia with anal fistulectomy N/A 07/22/2012    Procedure: EXAM UNDER ANESTHESIA WITH ANAL fistulotomy;  Surgeon: Leighton Ruff, MD;  Location: [place];  Service: General;  Laterality: N/A;  Family History  Problem Relation Age of Onset  . Cancer Mother     breast  . Cancer Brother     colon   History  Substance Use Topics  . Smoking status: Never Smoker   . Smokeless tobacco: Never Used  . Alcohol Use: No    Review of Systems  All other systems reviewed and are negative.     Allergies  Iodine; Niacin and related; and Adhesive  Home Medications   Prior to Admission medications   Medication Sig Start Date End Date Taking? Authorizing Provider   atenolol (TENORMIN) 25 MG tablet Take 25 mg by mouth daily after breakfast.    Historical Provider, MD  atorvastatin (LIPITOR) 20 MG tablet Take 20 mg by mouth at bedtime.    Historical Provider, MD  calcium carbonate (TUMS - DOSED IN MG ELEMENTAL CALCIUM) 500 MG chewable tablet Chew 1 tablet by mouth daily as needed for indigestion or heartburn.    Historical Provider, MD  clopidogrel (PLAVIX) 75 MG tablet Take 75 mg by mouth daily.  11/30/12   Historical Provider, MD  docusate sodium (COLACE) 100 MG capsule Take 200 mg by mouth 2 (two) times daily.     Historical Provider, MD  lansoprazole (PREVACID) 15 MG capsule Take 15 mg by mouth daily as needed (for heartburn).  11/30/12   Historical Provider, MD  nitroGLYCERIN (NITROSTAT) 0.4 MG SL tablet Place 0.4 mg under the tongue every 5 (five) minutes as needed.    Historical Provider, MD  oxymetazoline (AFRIN) 0.05 % nasal spray Place 1 spray into both nostrils daily.    Historical Provider, MD  polyethylene glycol (MIRALAX / GLYCOLAX) packet Take 17 g by mouth daily.    Historical Provider, MD   There were no vitals taken for this visit. Physical Exam  Nursing note and vitals reviewed. Constitutional: He appears well-developed and well-nourished. No distress.  Awake, alert, nontoxic appearance  HENT:  Head: Atraumatic.  Eyes: Conjunctivae are normal. Right eye exhibits no discharge. Left eye exhibits no discharge.  Neck: Normal range of motion. Neck supple.  Cardiovascular: Normal rate and regular rhythm.   Pulmonary/Chest: Effort normal. No respiratory distress. He exhibits no tenderness.  Abdominal: Soft. There is no tenderness. There is no rebound.  Musculoskeletal: He exhibits no tenderness.  ROM appears intact, no obvious focal weakness  Neurological: He is alert.  Neurologic exam:  Speech clear, pupils equal round reactive to light, extraocular movements intact  Normal peripheral visual fields Cranial nerves III through XII normal  including no facial droop Follows commands, moves all extremities x4, normal strength to bilateral upper and lower extremities at all major muscle groups including grip Sensation normal to light touch  Coordination intact, no limb ataxia, finger-nose-finger normal Rapid alternating movements normal No pronator drift Gait normal   Skin: Skin is warm and dry. No rash noted.  Psychiatric: He has a normal mood and affect.    ED Course  Procedures (including critical care time)  7:56 AM Patient presents with TIA-related symptoms. Now having persistent pain in sensation to his left lips. He has no focal neuro deficit on exam only subjective paresthesia to lips as mentioned above. 5/5 strength throughout. Work up initiated. Care discussed with Dr. Roderic Palau. Call made to neurologist Dr. Leonie Man. Due to isolated lip tingling sensation, code stroke was not activated.  9:06 AM Still report tingling sensation to lips.  Head CT without acute changes.  ABCD2 score is 4.  I have consulted with neurologist Dr.  Sethi who recommend brain MRI, head MRA for further evaluation.  If positive then pt to be admitted and neurology consult, if negative but ABCD2 score is 4 and above then to reconsulted.  Will order appropriate imaging.  Dr. Leonie Man also agrees that code stroke should not be activated.    12:07 PM MRI/MRA demonstrate acute right paramedian pontine infarction.  No hemorrhage or mass effect.  I have discussed that finding with Dr. Leonie Man, who recommend consulting [facility] and have pt admit to medicine for further management.    12:16 PM Given that pt has L arm tingling/numbness sensation around 3am upon waking, his last seen normal was light night. He is outside the window for TPA.  I have consult neurohospitalist Dr. Leonel Ramsay who agrees to see pt in ER.  Will consult medicine for admission.    12:32 PM I have consulted with [facility], Dr. Wyline Copas who agrees to admit pt to tele, team 10  under his care.  Pt is made aware of plan.  Pt currently hemodynamically stable, in NAD.  Given that pt has acute stroke requiring multiple phone calls and consultation to several specialists as well as reevaluation.  Critical care time filed.    CRITICAL CARE Performed by: Domenic Moras Total critical care time: 40 min Critical care time was exclusive of separately billable procedures and treating other patients. Critical care was necessary to treat or prevent imminent or life-threatening deterioration. Critical care was time spent personally by me on the following activities: development of treatment plan with patient and/or surrogate as well as nursing, discussions with consultants, evaluation of patient's response to treatment, examination of patient, obtaining history from patient or surrogate, ordering and performing treatments and interventions, ordering and review of laboratory studies, ordering and review of radiographic studies, pulse oximetry and re-evaluation of patient's condition.   Labs Review Labs Reviewed  COMPREHENSIVE METABOLIC PANEL - Abnormal; Notable for the following:    GFR calc non Af Amer 76 (*)    GFR calc Af Amer 88 (*)    All other components within normal limits  URINE RAPID DRUG SCREEN (HOSP PERFORMED)  CBC  I-STAT TROPOININ, ED    Imaging Review Ct Head Wo Contrast  05/26/2013   CLINICAL DATA:  Left arm numbness and tingling for approximately 10 min with some residual unusual sensation with in the lips and tongue  EXAM: CT HEAD WITHOUT CONTRAST  TECHNIQUE: Contiguous axial images were obtained from the base of the skull through the vertex without intravenous contrast.  COMPARISON:  MR MRA HEAD W/O CM dated 12/30/2012; CT HEAD W/O CM dated 11/30/2012  FINDINGS: The ventricles are normal in size and position. There is no intracranial hemorrhage nor intracranial mass effect. There is no evidence of an evolving ischemic infarction. There is decreased density in the  deep white matter of both cerebral hemispheres consistent with chronic small vessel ischemic type change. The cerebellum and brainstem are normal in density.  At bone window settings the observed portions of the paranasal sinuses and mastoid air cells are clear. There is no lytic or blastic bony lesion. There is no evidence of a skull fracture.  IMPRESSION: There are mild stable changes consistent with chronic small vessel ischemia predominantly in the parietal lobes. There is no evidence of an acute intracranial hemorrhage nor of an evolving ischemic event. There is no intracranial mass effect nor hydrocephalus.   Electronically Signed   By: David  Martinique   On: 05/26/2013 08:29   Mr Brain  Wo Contrast  05/26/2013   CLINICAL DATA:  Left arm numbness and tingling. Evaluate for stroke.  EXAM: MRI HEAD WITHOUT CONTRAST  MRA HEAD WITHOUT CONTRAST  TECHNIQUE: Multiplanar, multiecho pulse sequences of the brain and surrounding structures were obtained without intravenous contrast. Angiographic images of the head were obtained using MRA technique without contrast.  COMPARISON:  CT head earlier in the day.  MR head 12/30/12.  FINDINGS: MRI HEAD FINDINGS  There is an acute right paramedian pontine infarction, approximately 4 x 10 mm cross-section confirmed on coronal DWI. No hemorrhage, mass lesion, hydrocephalus, or extra-axial fluid.  Mild cerebral and cerebellar atrophy not unexpected for age. Moderate subcortical and periventricular T2 and FLAIR hyperintensities, likely chronic microvascular ischemic change from hypertension. No foci of chronic hemorrhage. Flow voids are preserved. Bilateral cataract extraction. No acute mastoid or sinus fluid.  Compared with prior CT, the infarct is not visible. Compared with prior MR, the infarct was not present.  MRA HEAD FINDINGS  Internal carotid arteries are widely patent. Basilar artery is widely patent with right vertebral dominant/sole contributor. Left vertebral ends in PICA.  No flow-limiting intracranial stenosis or visible berry aneurysm.  IMPRESSION: Acute right paramedian pontine infarction, likely related to small vessel disease. No hemorrhage or mass effect.  Chronic microvascular ischemic change.  No flow-limiting stenosis is observed.   Electronically Signed   By: Rolla Flatten M.D.   On: 05/26/2013 11:51   Mr Jodene Nam Head/brain Wo Cm  05/26/2013   CLINICAL DATA:  Left arm numbness and tingling. Evaluate for stroke.  EXAM: MRI HEAD WITHOUT CONTRAST  MRA HEAD WITHOUT CONTRAST  TECHNIQUE: Multiplanar, multiecho pulse sequences of the brain and surrounding structures were obtained without intravenous contrast. Angiographic images of the head were obtained using MRA technique without contrast.  COMPARISON:  CT head earlier in the day.  MR head 12/30/12.  FINDINGS: MRI HEAD FINDINGS  There is an acute right paramedian pontine infarction, approximately 4 x 10 mm cross-section confirmed on coronal DWI. No hemorrhage, mass lesion, hydrocephalus, or extra-axial fluid.  Mild cerebral and cerebellar atrophy not unexpected for age. Moderate subcortical and periventricular T2 and FLAIR hyperintensities, likely chronic microvascular ischemic change from hypertension. No foci of chronic hemorrhage. Flow voids are preserved. Bilateral cataract extraction. No acute mastoid or sinus fluid.  Compared with prior CT, the infarct is not visible. Compared with prior MR, the infarct was not present.  MRA HEAD FINDINGS  Internal carotid arteries are widely patent. Basilar artery is widely patent with right vertebral dominant/sole contributor. Left vertebral ends in PICA. No flow-limiting intracranial stenosis or visible berry aneurysm.  IMPRESSION: Acute right paramedian pontine infarction, likely related to small vessel disease. No hemorrhage or mass effect.  Chronic microvascular ischemic change.  No flow-limiting stenosis is observed.   Electronically Signed   By: Rolla Flatten M.D.   On: 05/26/2013  11:51     EKG Interpretation   Date/Time:  Wednesday May 26 2013 07:37:26 EDT Ventricular Rate:  56 PR Interval:  141 QRS Duration: 87 QT Interval:  424 QTC Calculation: 409 R Axis:   69 Text Interpretation:  Sinus rhythm Nonspecific T abnrm, anterolateral  leads      MDM   Final diagnoses:  Acute ischemic stroke    BP 164/80  Pulse 55  Temp(Src) 97.6 F (36.4 C) (Oral)  Resp 17  SpO2 100%  I have reviewed nursing notes and vital signs. I personally reviewed the imaging tests through PACS system  I reviewed available  ER/hospitalization records thought the EMR     Domenic Moras, [place] 05/26/13 1236

## 2013-05-26 NOTE — H&P (Signed)
[facility] History and Physical  Jason Velazquez PJA:[id number] DOB: 1928-07-02 DOA: 05/26/2013  Referring physician: Emergency Department PCP: Mathews Argyle, MD  Specialists:   Chief Complaint: L arm numbness  HPI: Jason Velazquez is a 80 y.o. male  With a hx of htn, hld, cad who presents to the ED with new episode of L arm numbness and tingling and later numbness of the lips. Sx first noted after awakening overnight around 3am. Since being in the Ed, pt reports sx in L arm and L leg have resolved. Still complains of residual numbness of L side of mouth. Passed bedside swallow eval. Hospitalist consulted for admission.   Review of Systems:  Per above, the remainder of the 10pt ros reviewed and are neg  Past Medical History  Diagnosis Date  . Hypertension   . Hypercholesteremia   . Heart murmur   . Benign prostatic hypertrophy   . GERD (gastroesophageal reflux disease)   . History of melanoma excision     FACE  . Perianal fistula   . S/P CABG x 4     1988  . S/P drug eluting coronary stent placement     POST CABG--  STENTING 2000  AND RESTENTING 2006 FOR REINSTENT STENOSIS  . Coronary artery disease CARDIOLOGIST -- DR Daneen Schick  . Allergic rhinitis   . H/O hiatal hernia   . Mild mitral regurgitation   . Left carotid stenosis     > 50%  PER DUPLEX  03-28-2011   Past Surgical History  Procedure Laterality Date  . Transurethral resection of prostate  04/09/2011    Procedure: TRANSURETHRAL RESECTION OF THE PROSTATE WITH GYRUS INSTRUMENTS;  Surgeon: Malka So, MD;  Location: [facility];  Service: Urology;  Laterality: N/A;  . Cataract extraction w/ intraocular lens  implant, bilateral    . Tonsillectomy and adenoidectomy  1944  . Shoulder arthroscopy with open rotator cuff repair Right 07-10-1999  . Coronary angioplasty with stent placement  2000    PCI AND STENTING CIRCUMFLEX  . Coronary angioplasty with stent placement  04-30-2004  DR Daneen Schick    RE-INSTENT  STENOSIS/  DRUG-ELUTING STENT OF PROXIMAL AND MID CIRCUMFLEX/ PCI MID CIRCUMFLEX THROUGH STENT STRUT/  WIDELY PATENT SAPHENOUS VEIN GRAFT AND  LIMA TO LAD GRAFT/ TOTAL OCCLUSION RIGHT CORONARY BEYOND THE POSTERIOR DESCENDING ARTERY BRANCH WITH 50% OSTIAL NARROWING/ TOTAL OCCLUSION OF LAD AT THE OSTIUM OF THE LEFT MAIN  . Coronary artery bypass graft  1988    X5  . Transthoracic echocardiogram  03-01-2011  DR Daneen Schick    NORMAL LVF AND LV SIZE/ MILD LEFT ATRIAL ENLARGEMENT/ GRADE II DIASTOLIC DYSFUNCTION WITH ELEVATED LEFT ATRIAL PRESSURE/ MILD TO MODERATE MR/ MILD TR  . Evaluation under anesthesia with anal fistulectomy N/A 07/22/2012    Procedure: EXAM UNDER ANESTHESIA WITH ANAL fistulotomy;  Surgeon: Leighton Ruff, MD;  Location: [place];  Service: General;  Laterality: N/A;   Social History:  reports that he has never smoked. He has never used smokeless tobacco. He reports that he does not drink alcohol or use illicit drugs.  where does patient live--home, ALF, SNF? and with whom if at home?  Can patient participate in ADLs?  Allergies  Allergen Reactions  . Iodine Rash    Rash when applied to skin  . Niacin And Related Hives and Other (See Comments)    FLUSHING  . Adhesive [Tape] Rash    Family History  Problem Relation Age of Onset  . Cancer  Mother     breast  . Cancer Brother     colon    (be sure to complete)  Prior to Admission medications   Medication Sig Start Date End Date Taking? Authorizing Provider  aspirin 325 MG tablet Take 325 mg by mouth daily.   Yes Historical Provider, MD  atenolol (TENORMIN) 25 MG tablet Take 25 mg by mouth daily after breakfast.   Yes Historical Provider, MD  atorvastatin (LIPITOR) 20 MG tablet Take 20 mg by mouth at bedtime.   Yes Historical Provider, MD  docusate sodium (COLACE) 100 MG capsule Take 200 mg by mouth 2 (two) times daily.    Yes Historical Provider, MD  nitroGLYCERIN (NITROSTAT) 0.4 MG SL tablet Place 0.4 mg  under the tongue every 5 (five) minutes as needed for chest pain.    Yes Historical Provider, MD  omeprazole (PRILOSEC) 20 MG capsule Take 20 mg by mouth daily. 05/12/13  Yes Historical Provider, MD  oxymetazoline (AFRIN) 0.05 % nasal spray Place 1 spray into both nostrils daily.   Yes Historical Provider, MD  polyethylene glycol (MIRALAX / GLYCOLAX) packet Take 17 g by mouth daily.   Yes Historical Provider, MD  tobramycin-dexamethasone [facility]) ophthalmic solution Place 1 drop into the right eye 4 (four) times daily. 05/05/13  Yes Historical Provider, MD   Physical Exam: Filed Vitals:   05/26/13 0733 05/26/13 1215  BP: 166/82 164/80  Pulse: 57 55  Temp: 97.6 F (36.4 C)   TempSrc: Oral   Resp: 16 17  SpO2: 100% 100%     General:  Awake, in nad  Eyes: PERRL B  ENT: membranes moist, dentition fair  Neck: trachea midline, neck supple  Cardiovascular: regular, s1, s2  Respiratory: normal resp effort, no wheezing  Abdomen: soft, nondistended  Skin: normal skin turgor, no abnormal skin lesions seen  Musculoskeletal: perfused, no clubbing  Psychiatric: mood/affect normal // no auditory/visual hallucinations  Neurologic: numbness over L side of mouth, 5/5 strength/ throughout, sensation intact throughout  Labs on Admission:  Basic Metabolic Panel:  Recent Labs Lab 05/26/13 0805  NA 138  K 5.0  CL 99  CO2 28  GLUCOSE 97  BUN 10  CREATININE 0.93  CALCIUM 9.6   Liver Function Tests:  Recent Labs Lab 05/26/13 0805  AST 24  ALT 23  ALKPHOS 65  BILITOT 0.7  PROT 6.9  ALBUMIN 3.8   No results found for this basename: LIPASE, AMYLASE,  in the last 168 hours No results found for this basename: AMMONIA,  in the last 168 hours CBC:  Recent Labs Lab 05/26/13 0805  WBC 5.0  HGB 14.9  HCT 43.3  MCV 91.4  PLT 193   Cardiac Enzymes: No results found for this basename: CKTOTAL, CKMB, CKMBINDEX, TROPONINI,  in the last 168 hours  BNP (last 3 results) No  results found for this basename: PROBNP,  in the last 8760 hours CBG: No results found for this basename: GLUCAP,  in the last 168 hours  Radiological Exams on Admission: Ct Head Wo Contrast  05/26/2013   CLINICAL DATA:  Left arm numbness and tingling for approximately 10 min with some residual unusual sensation with in the lips and tongue  EXAM: CT HEAD WITHOUT CONTRAST  TECHNIQUE: Contiguous axial images were obtained from the base of the skull through the vertex without intravenous contrast.  COMPARISON:  MR MRA HEAD W/O CM dated 12/30/2012; CT HEAD W/O CM dated 11/30/2012  FINDINGS: The ventricles are normal in size and  position. There is no intracranial hemorrhage nor intracranial mass effect. There is no evidence of an evolving ischemic infarction. There is decreased density in the deep white matter of both cerebral hemispheres consistent with chronic small vessel ischemic type change. The cerebellum and brainstem are normal in density.  At bone window settings the observed portions of the paranasal sinuses and mastoid air cells are clear. There is no lytic or blastic bony lesion. There is no evidence of a skull fracture.  IMPRESSION: There are mild stable changes consistent with chronic small vessel ischemia predominantly in the parietal lobes. There is no evidence of an acute intracranial hemorrhage nor of an evolving ischemic event. There is no intracranial mass effect nor hydrocephalus.   Electronically Signed   By: David  Martinique   On: 05/26/2013 08:29   Mr Brain Wo Contrast  05/26/2013   CLINICAL DATA:  Left arm numbness and tingling. Evaluate for stroke.  EXAM: MRI HEAD WITHOUT CONTRAST  MRA HEAD WITHOUT CONTRAST  TECHNIQUE: Multiplanar, multiecho pulse sequences of the brain and surrounding structures were obtained without intravenous contrast. Angiographic images of the head were obtained using MRA technique without contrast.  COMPARISON:  CT head earlier in the day.  MR head 12/30/12.   FINDINGS: MRI HEAD FINDINGS  There is an acute right paramedian pontine infarction, approximately 4 x 10 mm cross-section confirmed on coronal DWI. No hemorrhage, mass lesion, hydrocephalus, or extra-axial fluid.  Mild cerebral and cerebellar atrophy not unexpected for age. Moderate subcortical and periventricular T2 and FLAIR hyperintensities, likely chronic microvascular ischemic change from hypertension. No foci of chronic hemorrhage. Flow voids are preserved. Bilateral cataract extraction. No acute mastoid or sinus fluid.  Compared with prior CT, the infarct is not visible. Compared with prior MR, the infarct was not present.  MRA HEAD FINDINGS  Internal carotid arteries are widely patent. Basilar artery is widely patent with right vertebral dominant/sole contributor. Left vertebral ends in PICA. No flow-limiting intracranial stenosis or visible berry aneurysm.  IMPRESSION: Acute right paramedian pontine infarction, likely related to small vessel disease. No hemorrhage or mass effect.  Chronic microvascular ischemic change.  No flow-limiting stenosis is observed.   Electronically Signed   By: Rolla Flatten M.D.   On: 05/26/2013 11:51   Mr Jodene Nam Head/brain Wo Cm  05/26/2013   CLINICAL DATA:  Left arm numbness and tingling. Evaluate for stroke.  EXAM: MRI HEAD WITHOUT CONTRAST  MRA HEAD WITHOUT CONTRAST  TECHNIQUE: Multiplanar, multiecho pulse sequences of the brain and surrounding structures were obtained without intravenous contrast. Angiographic images of the head were obtained using MRA technique without contrast.  COMPARISON:  CT head earlier in the day.  MR head 12/30/12.  FINDINGS: MRI HEAD FINDINGS  There is an acute right paramedian pontine infarction, approximately 4 x 10 mm cross-section confirmed on coronal DWI. No hemorrhage, mass lesion, hydrocephalus, or extra-axial fluid.  Mild cerebral and cerebellar atrophy not unexpected for age. Moderate subcortical and periventricular T2 and FLAIR  hyperintensities, likely chronic microvascular ischemic change from hypertension. No foci of chronic hemorrhage. Flow voids are preserved. Bilateral cataract extraction. No acute mastoid or sinus fluid.  Compared with prior CT, the infarct is not visible. Compared with prior MR, the infarct was not present.  MRA HEAD FINDINGS  Internal carotid arteries are widely patent. Basilar artery is widely patent with right vertebral dominant/sole contributor. Left vertebral ends in PICA. No flow-limiting intracranial stenosis or visible berry aneurysm.  IMPRESSION: Acute right paramedian pontine infarction, likely related to  small vessel disease. No hemorrhage or mass effect.  Chronic microvascular ischemic change.  No flow-limiting stenosis is observed.   Electronically Signed   By: Rolla Flatten M.D.   On: 05/26/2013 11:51    EKG: Independently reviewed. NSR  Assessment/Plan Principal Problem:   Acute ischemic stroke Active Problems:   HTN (hypertension)   HLD (hyperlipidemia)   CAD (coronary artery disease)   1. Acute CVA 1. Neurology consulted 2. Sx appear to be improving 3. On asa currently. Consider transition to plavix 4. Consult pt/ot/slp 5. Check 2d echo, B carotids 6. Will check lipids and a1c 7. Admit to med-tele 2. HTN 1. BP stable 2. Will allow permissive htn 3. HLD 1. F/u fasting lipids 4. CAD 1. Stable 2. EKG with NSR 5. DVT prophylaxis 1. Heparin subq   Code Status: Full (must indicate code status--if unknown or must be presumed, indicate so) Family Communication: Pt in room (indicate person spoken with, if applicable, with phone number if by telephone) Disposition Plan: Pending (indicate anticipated LOS)  Time spent: 61min  Ariya Bohannon K Kasandra Fehr [facility] Pager [phone]  If 7PM-7AM, please contact night-coverage [email or web address] Password [facility] 05/26/2013, 12:37 PM

## 2013-05-26 NOTE — ED Notes (Signed)
Pt stated that his tongue felt different and that it was a prickly feeling and it was on his lips too.

## 2013-05-26 NOTE — ED Provider Notes (Signed)
Medical screening examination/treatment/procedure(s) were performed by non-physician practitioner and as supervising physician I was immediately available for consultation/collaboration.   EKG Interpretation   Date/Time:  Wednesday May 26 2013 07:37:26 EDT Ventricular Rate:  56 PR Interval:  141 QRS Duration: 87 QT Interval:  424 QTC Calculation: 409 R Axis:   69 Text Interpretation:  Sinus rhythm Nonspecific T abnrm, anterolateral  leads        Maudry Diego, MD 05/26/13 1520

## 2013-05-26 NOTE — Progress Notes (Signed)
Received call from CMT that was tachy rate > 146 Rn went in to check on pt , HR 67  Pt denied any c/o at this time report given to oncoming shift RN.  Pt in a chair at bedside ,spouse with pt no distress noted.

## 2013-05-26 NOTE — Progress Notes (Signed)
Pt admitted from Ed with dx of stroke initial assessment done  Stroke education initiated with pt and family  .Cardiac monitor in use normal sinus on the monitor with heat rate 70 s.  Pt denied pain will continue to monitor call bell within reach.

## 2013-05-27 DIAGNOSIS — G459 Transient cerebral ischemic attack, unspecified: Secondary | ICD-10-CM

## 2013-05-27 DIAGNOSIS — I6350 Cerebral infarction due to unspecified occlusion or stenosis of unspecified cerebral artery: Secondary | ICD-10-CM

## 2013-05-27 DIAGNOSIS — I10 Essential (primary) hypertension: Secondary | ICD-10-CM | POA: Diagnosis not present

## 2013-05-27 DIAGNOSIS — I2510 Atherosclerotic heart disease of native coronary artery without angina pectoris: Secondary | ICD-10-CM | POA: Diagnosis not present

## 2013-05-27 DIAGNOSIS — I517 Cardiomegaly: Secondary | ICD-10-CM

## 2013-05-27 LAB — HEMOGLOBIN A1C
Hgb A1c MFr Bld: 5.8 % — ABNORMAL HIGH (ref <5.7)
MEAN PLASMA GLUCOSE: 120 mg/dL — AB (ref <117)

## 2013-05-27 LAB — LIPID PANEL
CHOLESTEROL: 126 mg/dL (ref 0–200)
HDL: 40 mg/dL (ref >39)
LDL Cholesterol: 69 mg/dL (ref 0–99)
Total CHOL/HDL Ratio: 3.2 RATIO
Triglycerides: 85 mg/dL (ref <150)
VLDL: 17 mg/dL (ref 0–40)

## 2013-05-27 LAB — CBC
HCT: 40.1 % (ref 39.0–52.0)
HEMOGLOBIN: 13.7 g/dL (ref 13.0–17.0)
MCH: 30.8 pg (ref 26.0–34.0)
MCHC: 34.2 g/dL (ref 30.0–36.0)
MCV: 90.1 fL (ref 78.0–100.0)
Platelets: 174 10*3/uL (ref 150–400)
RBC: 4.45 MIL/uL (ref 4.22–5.81)
RDW: 12.7 % (ref 11.5–15.5)
WBC: 6.2 10*3/uL (ref 4.0–10.5)

## 2013-05-27 LAB — COMPREHENSIVE METABOLIC PANEL
ALT: 18 U/L (ref 0–53)
AST: 19 U/L (ref 0–37)
Albumin: 3.3 g/dL — ABNORMAL LOW (ref 3.5–5.2)
Alkaline Phosphatase: 56 U/L (ref 39–117)
BUN: 14 mg/dL (ref 6–23)
CO2: 24 mEq/L (ref 19–32)
CREATININE: 0.86 mg/dL (ref 0.50–1.35)
Calcium: 8.8 mg/dL (ref 8.4–10.5)
Chloride: 97 mEq/L (ref 96–112)
GFR calc non Af Amer: 79 mL/min — ABNORMAL LOW (ref >90)
GLUCOSE: 95 mg/dL (ref 70–99)
POTASSIUM: 4.2 meq/L (ref 3.7–5.3)
Sodium: 135 mEq/L — ABNORMAL LOW (ref 137–147)
TOTAL PROTEIN: 5.8 g/dL — AB (ref 6.0–8.3)
Total Bilirubin: 0.7 mg/dL (ref 0.3–1.2)

## 2013-05-27 MED ORDER — STUDY - INVESTIGATIONAL DRUG SIMPLE RECORD: 90.0000 mg | Freq: Two times a day (BID) | Status: DC

## 2013-05-27 MED ORDER — STUDY - INVESTIGATIONAL DRUG SIMPLE RECORD
100.0000 mg | Freq: Every day | Status: DC
Start: 2013-05-27 — End: 2013-09-21

## 2013-05-27 NOTE — Evaluation (Signed)
Clinical/Bedside Swallow Evaluation Patient Details  Name: Jason Velazquez MRN: [id number] Date of Birth: December 14, 1928  Today's Date: 05/27/2013 Time: 0998-3382 SLP Time Calculation (min): 9 min  Past Medical History:  Past Medical History  Diagnosis Date  . Hypertension   . Hypercholesteremia   . Heart murmur   . Benign prostatic hypertrophy   . GERD (gastroesophageal reflux disease)   . History of melanoma excision     FACE  . Perianal fistula   . S/P CABG x 4     1988  . S/P drug eluting coronary stent placement     POST CABG--  STENTING 2000  AND RESTENTING 2006 FOR REINSTENT STENOSIS  . Coronary artery disease CARDIOLOGIST -- DR Daneen Schick  . Allergic rhinitis   . H/O hiatal hernia   . Mild mitral regurgitation   . Left carotid stenosis     > 50%  PER DUPLEX  03-28-2011   Past Surgical History:  Past Surgical History  Procedure Laterality Date  . Transurethral resection of prostate  04/09/2011    Procedure: TRANSURETHRAL RESECTION OF THE PROSTATE WITH GYRUS INSTRUMENTS;  Surgeon: Malka So, MD;  Location: [facility];  Service: Urology;  Laterality: N/A;  . Cataract extraction w/ intraocular lens  implant, bilateral    . Tonsillectomy and adenoidectomy  1944  . Shoulder arthroscopy with open rotator cuff repair Right 07-10-1999  . Coronary angioplasty with stent placement  2000    PCI AND STENTING CIRCUMFLEX  . Coronary angioplasty with stent placement  04-30-2004  DR Daneen Schick    RE-INSTENT STENOSIS/  DRUG-ELUTING STENT OF PROXIMAL AND MID CIRCUMFLEX/ PCI MID CIRCUMFLEX THROUGH STENT STRUT/  WIDELY PATENT SAPHENOUS VEIN GRAFT AND  LIMA TO LAD GRAFT/ TOTAL OCCLUSION RIGHT CORONARY BEYOND THE POSTERIOR DESCENDING ARTERY BRANCH WITH 50% OSTIAL NARROWING/ TOTAL OCCLUSION OF LAD AT THE OSTIUM OF THE LEFT MAIN  . Coronary artery bypass graft  1988    X5  . Transthoracic echocardiogram  03-01-2011  DR Daneen Schick    NORMAL LVF AND LV SIZE/ MILD LEFT ATRIAL ENLARGEMENT/ GRADE II  DIASTOLIC DYSFUNCTION WITH ELEVATED LEFT ATRIAL PRESSURE/ MILD TO MODERATE MR/ MILD TR  . Evaluation under anesthesia with anal fistulectomy N/A 07/22/2012    Procedure: EXAM UNDER ANESTHESIA WITH ANAL fistulotomy;  Surgeon: Leighton Ruff, MD;  Location: [place];  Service: General;  Laterality: N/A;   HPI:  80 y.o. male with a hx of htn, CAD, GERD, CABG x 4 who presents to the ED with new episode of L arm numbness and tingling and later numbness of the lips. Sx first noted after awakening overnight around 3am. Since being in the Ed, pt reports symptoms in L arm and L leg have resolved. MRI Acute right paramedian pontine infarction, likely related to small vessel disease.   Assessment / Plan / Recommendation Clinical Impression  Normal/functional oropharyngeal phases of swallow exhibited with various textures.  Pt. suffered pontine infarct and educated pt./wife regarding function of the pons in regards to swallow function.  Recommend continue regular texture and thin liquids without ST intervention.      Aspiration Risk  Mild    Diet Recommendation Regular;Thin liquid   Liquid Administration via: Cup;Straw Medication Administration: Whole meds with liquid Compensations: Slow rate;Small sips/bites Postural Changes and/or Swallow Maneuvers: Seated upright 90 degrees;Upright 30-60 min after meal    Other  Recommendations Oral Care Recommendations: Oral care BID   Follow Up Recommendations  None    Frequency and Duration  Pertinent Vitals/Pain WDL         Swallow Study           Oral/Motor/Sensory Function Overall Oral Motor/Sensory Function: Appears within functional limits for tasks assessed   Ice Chips Ice chips: Not tested   Thin Liquid Thin Liquid: Within functional limits Presentation: Cup;Straw    Nectar Thick Nectar Thick Liquid: Not tested   Honey Thick Honey Thick Liquid: Not tested   Puree Puree: Within functional limits   Solid   GO     Solid: Within functional limits       Houston Siren M.Ed [employer] [phone]  05/27/2013

## 2013-05-27 NOTE — Progress Notes (Signed)
  Echocardiogram 2D Echocardiogram has been performed.  Donata Clay 05/27/2013, 3:00 PM

## 2013-05-27 NOTE — Progress Notes (Signed)
OT Cancellation Note  Patient Details Name: Jason Velazquez MRN: [id number] DOB: [birth year]   Cancelled Treatment:    Reason Eval/Treat Not Completed: OT screened, no needs identified, will sign off  [place], OTR/L [phone] 05/27/2013, 1:37 PM

## 2013-05-27 NOTE — Progress Notes (Signed)
VASCULAR LAB PRELIMINARY  PRELIMINARY  PRELIMINARY  PRELIMINARY  Carotid duplex  completed.    Preliminary report:  No significant ICA stenosis noted bilaterally.  Vertebral artery flow antegrade. Margarette Canada, RVT 05/27/2013, 2:30 PM

## 2013-05-27 NOTE — Discharge Summary (Signed)
Physician Discharge Summary  Jason Velazquez WNI:[id number] DOB: April 02, 1928 DOA: 05/26/2013  PCP: Mathews Argyle, MD  Admit date: 05/26/2013 Discharge date: 05/27/2013  Time spent: 35 minutes  Recommendations for Outpatient Follow-up:  1. Follow up with PCP in 1-2 weeks 2. Follow up with Dr. Leonie Man in 2 months 3. Continue Socrates study drug for secondary stroke prevention. Socrates is a comparison of 90-day treatment with ticagrelor vs aspirin for the prevention of major vascular events in patients with acute ischaemic stroke or TIA. Please contact Dyke Brackett at [facility] at [phone] for any questions   Discharge Diagnoses:  Principal Problem:   Acute ischemic stroke Active Problems:   HTN (hypertension)   HLD (hyperlipidemia)   CAD (coronary artery disease)   CVA (cerebral infarction)   Discharge Condition: Improved  Diet recommendation: Heart Healthy  There were no vitals filed for this visit.  History of present illness:  Jason Velazquez is a 80 y.o. male  With a hx of htn, hld, cad who presents to the ED with new episode of L arm numbness and tingling and later numbness of the lips. Sx first noted after awakening overnight around 3am. Since being in the Ed, pt reports sx in L arm and L leg have resolved. Still complains of residual numbness of L side of mouth. Passed bedside swallow eval. Hospitalist consulted for admission.  Hospital Course:  The patient was admitted to the floor. MRI demonstrated an acute R paramedian pontine infarct. Neurology was consulted. Carotid dopplers were negative for stenosis bilaterally. A 2D echo was obtained which was unremarkable. The patient has since been placed under the SOCRATES trial (see above). He has remained stable and will follow up closely as an outpatient.   Consultations:  Neurology  Discharge Exam: Filed Vitals:   05/26/13 2100 05/27/13 0552 05/27/13 0946 05/27/13 1343  BP: 135/72 167/99  130/73 167/84  Pulse: 63 67 73 63  Temp: 97.9 F (36.6 C) 97.4 F (36.3 C) 97.4 F (36.3 C) 97.4 F (36.3 C)  TempSrc: Oral Oral Oral Oral  Resp: 18 18 18 18   SpO2: 100% 99% 99% 100%    General: Awake, in nad Cardiovascular: regular, s1, s2 Respiratory: normal resp effort, no wheezing  Discharge Instructions       Future Appointments Provider Department Dept Phone   06/02/2013 10:00 AM Wallene Huh, [place] at [place]       Medication List    STOP taking these medications       aspirin 325 MG tablet      TAKE these medications       atenolol 25 MG tablet  Commonly known as:  TENORMIN  Take 25 mg by mouth daily after breakfast.     atorvastatin 20 MG tablet  Commonly known as:  LIPITOR  Take 20 mg by mouth at bedtime.     docusate sodium 100 MG capsule  Commonly known as:  COLACE  Take 200 mg by mouth 2 (two) times daily.     nitroGLYCERIN 0.4 MG SL tablet  Commonly known as:  NITROSTAT  Place 0.4 mg under the tongue every 5 (five) minutes as needed for chest pain.     omeprazole 20 MG capsule  Commonly known as:  PRILOSEC  Take 20 mg by mouth daily.     oxymetazoline 0.05 % nasal spray  Commonly known as:  AFRIN  Place 1 spray into both nostrils daily.     polyethylene glycol packet  Commonly known as:  MIRALAX / GLYCOLAX  Take 17 g by mouth daily.     research study medication  Take 100 mg by mouth daily.     research study medication  Take 90 mg by mouth 2 (two) times daily.     tobramycin-dexamethasone ophthalmic solution  Commonly known as:  TOBRADEX  Place 1 drop into the right eye 4 (four) times daily.       Allergies  Allergen Reactions  . Iodine Rash    Rash when applied to skin  . Niacin And Related Hives and Other (See Comments)    FLUSHING  . Adhesive [Tape] Rash   Follow-up Information   Follow up with Mathews Argyle, MD. Schedule an appointment as soon as possible for a visit in 1  week.   Specialty:  Internal Medicine   Contact information:   [address] [place] [address] [phone]       Follow up with Forbes Cellar, MD. Schedule an appointment as soon as possible for a visit in 2 months.   Specialties:  Neurology, Radiology   Contact information:   [address] [place] [address] [phone]        The results of significant diagnostics from this hospitalization (including imaging, microbiology, ancillary and laboratory) are listed below for reference.    Significant Diagnostic Studies: Ct Head Wo Contrast  05/26/2013   CLINICAL DATA:  Left arm numbness and tingling for approximately 10 min with some residual unusual sensation with in the lips and tongue  EXAM: CT HEAD WITHOUT CONTRAST  TECHNIQUE: Contiguous axial images were obtained from the base of the skull through the vertex without intravenous contrast.  COMPARISON:  MR MRA HEAD W/O CM dated 12/30/2012; CT HEAD W/O CM dated 11/30/2012  FINDINGS: The ventricles are normal in size and position. There is no intracranial hemorrhage nor intracranial mass effect. There is no evidence of an evolving ischemic infarction. There is decreased density in the deep white matter of both cerebral hemispheres consistent with chronic small vessel ischemic type change. The cerebellum and brainstem are normal in density.  At bone window settings the observed portions of the paranasal sinuses and mastoid air cells are clear. There is no lytic or blastic bony lesion. There is no evidence of a skull fracture.  IMPRESSION: There are mild stable changes consistent with chronic small vessel ischemia predominantly in the parietal lobes. There is no evidence of an acute intracranial hemorrhage nor of an evolving ischemic event. There is no intracranial mass effect nor hydrocephalus.   Electronically Signed   By: David  Martinique   On: 05/26/2013 08:29   Mr Brain Wo Contrast  05/26/2013    CLINICAL DATA:  Left arm numbness and tingling. Evaluate for stroke.  EXAM: MRI HEAD WITHOUT CONTRAST  MRA HEAD WITHOUT CONTRAST  TECHNIQUE: Multiplanar, multiecho pulse sequences of the brain and surrounding structures were obtained without intravenous contrast. Angiographic images of the head were obtained using MRA technique without contrast.  COMPARISON:  CT head earlier in the day.  MR head 12/30/12.  FINDINGS: MRI HEAD FINDINGS  There is an acute right paramedian pontine infarction, approximately 4 x 10 mm cross-section confirmed on coronal DWI. No hemorrhage, mass lesion, hydrocephalus, or extra-axial fluid.  Mild cerebral and cerebellar atrophy not unexpected for age. Moderate subcortical and periventricular T2 and FLAIR hyperintensities, likely chronic microvascular ischemic change from hypertension. No foci of chronic hemorrhage. Flow voids are preserved. Bilateral cataract extraction.  No acute mastoid or sinus fluid.  Compared with prior CT, the infarct is not visible. Compared with prior MR, the infarct was not present.  MRA HEAD FINDINGS  Internal carotid arteries are widely patent. Basilar artery is widely patent with right vertebral dominant/sole contributor. Left vertebral ends in PICA. No flow-limiting intracranial stenosis or visible berry aneurysm.  IMPRESSION: Acute right paramedian pontine infarction, likely related to small vessel disease. No hemorrhage or mass effect.  Chronic microvascular ischemic change.  No flow-limiting stenosis is observed.   Electronically Signed   By: Rolla Flatten M.D.   On: 05/26/2013 11:51   Mr Jodene Nam Head/brain Wo Cm  05/26/2013   CLINICAL DATA:  Left arm numbness and tingling. Evaluate for stroke.  EXAM: MRI HEAD WITHOUT CONTRAST  MRA HEAD WITHOUT CONTRAST  TECHNIQUE: Multiplanar, multiecho pulse sequences of the brain and surrounding structures were obtained without intravenous contrast. Angiographic images of the head were obtained using MRA technique without  contrast.  COMPARISON:  CT head earlier in the day.  MR head 12/30/12.  FINDINGS: MRI HEAD FINDINGS  There is an acute right paramedian pontine infarction, approximately 4 x 10 mm cross-section confirmed on coronal DWI. No hemorrhage, mass lesion, hydrocephalus, or extra-axial fluid.  Mild cerebral and cerebellar atrophy not unexpected for age. Moderate subcortical and periventricular T2 and FLAIR hyperintensities, likely chronic microvascular ischemic change from hypertension. No foci of chronic hemorrhage. Flow voids are preserved. Bilateral cataract extraction. No acute mastoid or sinus fluid.  Compared with prior CT, the infarct is not visible. Compared with prior MR, the infarct was not present.  MRA HEAD FINDINGS  Internal carotid arteries are widely patent. Basilar artery is widely patent with right vertebral dominant/sole contributor. Left vertebral ends in PICA. No flow-limiting intracranial stenosis or visible berry aneurysm.  IMPRESSION: Acute right paramedian pontine infarction, likely related to small vessel disease. No hemorrhage or mass effect.  Chronic microvascular ischemic change.  No flow-limiting stenosis is observed.   Electronically Signed   By: Rolla Flatten M.D.   On: 05/26/2013 11:51    Microbiology: No results found for this or any previous visit (from the past 240 hour(s)).   Labs: Basic Metabolic Panel:  Recent Labs Lab 05/26/13 0805 05/27/13 0335  NA 138 135*  K 5.0 4.2  CL 99 97  CO2 28 24  GLUCOSE 97 95  BUN 10 14  CREATININE 0.93 0.86  CALCIUM 9.6 8.8   Liver Function Tests:  Recent Labs Lab 05/26/13 0805 05/27/13 0335  AST 24 19  ALT 23 18  ALKPHOS 65 56  BILITOT 0.7 0.7  PROT 6.9 5.8*  ALBUMIN 3.8 3.3*   No results found for this basename: LIPASE, AMYLASE,  in the last 168 hours No results found for this basename: AMMONIA,  in the last 168 hours CBC:  Recent Labs Lab 05/26/13 0805 05/27/13 0335  WBC 5.0 6.2  HGB 14.9 13.7  HCT 43.3 40.1   MCV 91.4 90.1  PLT 193 174   Cardiac Enzymes: No results found for this basename: CKTOTAL, CKMB, CKMBINDEX, TROPONINI,  in the last 168 hours BNP: BNP (last 3 results) No results found for this basename: PROBNP,  in the last 8760 hours CBG: No results found for this basename: GLUCAP,  in the last 168 hours     Signed:  Donne Hazel  [facility] 05/27/2013, 5:22 PM

## 2013-05-27 NOTE — Progress Notes (Signed)
Patient d/ced this evening with assessments unchanged from baseline. Educated on the need to follow up with the MD and the warning signs. Stroke pamphlet was given as a resource for him.

## 2013-05-27 NOTE — Progress Notes (Signed)
Stroke Team Progress Note  HISTORY Jason Velazquez is a 80 y.o. male who presents 05/26/2013 with left-sided numbness and discoordination that started last evening 05/25/2013. He went to bed around 10 PM and then awoke at 3 AM with the symptoms present. He had some mild difficulty with walking to the bathroom. He was LKW 10 PM 05/25/2013. Patient was not administered TPA secondary to delay in arrival. He was admitted to  for further evaluation and treatment.  SUBJECTIVE His wife and RN are at the bedside.  Overall he feels his condition is gradually improving. He reports left face and hand tingling. He is hoping to be able to go home today.  OBJECTIVE Most recent Vital Signs: Filed Vitals:   05/26/13 1550 05/26/13 2100 05/27/13 0552 05/27/13 0946  BP: 163/80 135/72 167/99 130/73  Pulse: 61 63 67 73  Temp: 97.3 F (36.3 C) 97.9 F (36.6 C) 97.4 F (36.3 C) 97.4 F (36.3 C)  TempSrc: Oral Oral Oral Oral  Resp: 18 18 18 18   SpO2: 100% 100% 99% 99%   CBG (last 3)  No results found for this basename: GLUCAP,  in the last 72 hours  IV Fluid Intake:     MEDICATIONS  . atenolol  25 mg Oral QPC breakfast  . atorvastatin  20 mg Oral QHS  . heparin  5,000 Units Subcutaneous 3 times per day  . pantoprazole  40 mg Oral Daily  . research study medication  100 mg Oral Daily  . research study medication  90 mg Oral BID  . sodium chloride  3 mL Intravenous Q12H   PRN:  acetaminophen, acetaminophen, morphine injection, ondansetron (ZOFRAN) IV, ondansetron  Diet:  Cardiac thin liquids Activity:  Bedrest DVT Prophylaxis:  Heparin 5000 units sq tid   CLINICALLY SIGNIFICANT STUDIES Basic Metabolic Panel:  Recent Labs Lab 05/26/13 0805 05/27/13 0335  NA 138 135*  K 5.0 4.2  CL 99 97  CO2 28 24  GLUCOSE 97 95  BUN 10 14  CREATININE 0.93 0.86  CALCIUM 9.6 8.8   Liver Function Tests:  Recent Labs Lab 05/26/13 0805 05/27/13 0335  AST 24 19  ALT 23 18  ALKPHOS 65 56  BILITOT 0.7 0.7   PROT 6.9 5.8*  ALBUMIN 3.8 3.3*   CBC:  Recent Labs Lab 05/26/13 0805 05/27/13 0335  WBC 5.0 6.2  HGB 14.9 13.7  HCT 43.3 40.1  MCV 91.4 90.1  PLT 193 174   Coagulation: No results found for this basename: LABPROT, INR,  in the last 168 hours Cardiac Enzymes: No results found for this basename: CKTOTAL, CKMB, CKMBINDEX, TROPONINI,  in the last 168 hours Urinalysis: No results found for this basename: COLORURINE, APPERANCEUR, LABSPEC, PHURINE, GLUCOSEU, HGBUR, BILIRUBINUR, KETONESUR, PROTEINUR, UROBILINOGEN, NITRITE, LEUKOCYTESUR,  in the last 168 hours Lipid Panel    Component Value Date/Time   CHOL 126 05/27/2013 0335   TRIG 85 05/27/2013 0335   HDL 40 05/27/2013 0335   HDL 46 12/16/2012 0837   CHOLHDL 3.2 05/27/2013 0335   CHOLHDL 3.0 12/16/2012 0837   VLDL 17 05/27/2013 0335   LDLCALC 69 05/27/2013 0335   LDLCALC 75 12/16/2012 0837   HgbA1C  Lab Results  Component Value Date   HGBA1C 6.2* 12/16/2012    Urine Drug Screen:     Component Value Date/Time   LABOPIA NONE DETECTED 05/26/2013 0844   COCAINSCRNUR NONE DETECTED 05/26/2013 0844   LABBENZ NONE DETECTED 05/26/2013 0844   AMPHETMU NONE DETECTED 05/26/2013 0844  THCU NONE DETECTED 05/26/2013 0844   LABBARB NONE DETECTED 05/26/2013 0844    Alcohol Level: No results found for this basename: ETH,  in the last 168 hours  CT of the brain  05/26/2013   There are mild stable changes consistent with chronic small vessel ischemia predominantly in the parietal lobes. There is no evidence of an acute intracranial hemorrhage nor of an evolving ischemic event. There is no intracranial mass effect nor hydrocephalus.     MRI of the brain  05/26/2013   Acute right paramedian pontine infarction, likely related to small vessel disease. No hemorrhage or mass effect.  MRA of the brain  05/26/2013   Chronic microvascular ischemic change.  No flow-limiting stenosis is observed.   2D Echocardiogram    Carotid Doppler    CXR    EKG  normal  sinus rhythm, nonspecific T waves changes. For complete results please see formal report.   Therapy Recommendations   Physical Exam   Pleasant elderly Caucasian male not in distress.Awake alert. Afebrile. Head is nontraumatic. Neck is supple without bruit. Hearing is normal. Cardiac exam no murmur or gallop. Lungs are clear to auscultation. Distal pulses are well felt. Neurological Exam ;  Awake  Alert oriented x 3. Normal speech and language.eye movements full without nystagmus.fundi were not visualized. Vision acuity and fields appear normal. Hearing is normal. Palatal movements are normal. Face symmetric. Tongue midline. Normal strength, tone, reflexes and coordination. Normal sensation. Gait broad-based slightly ataxic when he first gets up.. ASSESSMENT Jason Velazquez is a 80 y.o. male presenting with left sided numbness.  Imaging confirms a right paramedian pontine infarct. Infarct felt to be thrombotic secondary to small vessel disease.  On aspirin 325 mg orally every day prior to admission. Now on Socrates study drug for secondary stroke prevention. Patient with resultant left mouth and hand tingling. Stroke work up underway.  hypertension Hyperlipidemia, LDL 75, on lipitor 20 mg daily PTA, now on lipitor 20 mg daily, goal LDL < 100   Hx transient confusion - TGA vs TIA. Placed on plavix at that time. Recently changed back to aspirin.  CAD - s/p CABG 1988, stent 2000, restent 2006  Known L ICA stenosis > 50%  Hospital day # 1  TREATMENT/PLAN  Continue Socrates study drug for secondary stroke prevention. Socrates is a comparison of 90-day treatment with ticagrelor vs aspirin for the prevention of major vascular events in patients with acute ischaemic stroke or TIA. Please contact Dyke Brackett at [facility] at [phone] for any questions.  Follow 2D echo, carotid doppler, HgbA1c  Therapy evals   Burnetta Sabin, MSN, RN, ANVP-BC, AGPCNP-BC [facility] Pager: [phone] 05/27/2013 11:26 AM  I have personally obtained a history, examined the patient, evaluated imaging results, and formulated the assessment and plan of care. I agree with the above.  Antony Contras, MD  To contact Stroke Continuity provider, please refer to [email or web address] After hours, contact General Neurology

## 2013-05-27 NOTE — Progress Notes (Signed)
   CARE MANAGEMENT NOTE 05/27/2013  Patient:  Jason Velazquez, Jason Velazquez   Account Number:  [id number]  Date Initiated:  05/27/2013  Documentation initiated by:  Olga Coaster  Subjective/Objective Assessment:   ADMITTED WITH STROKE     Action/Plan:   CM FOLLOWING FOR DCP   Anticipated DC Date:  06/01/2013   Anticipated DC Plan:  AWAITING FOR PT/OT EVAL FOR DISPOSITION NEEDS WITH ATTENDING MD APPROVAL  In-house referral  Clinical Social Worker             Status of service:  In process, will continue to follow Medicare Important Message given?  NA - LOS <3 / Initial given by admissions (If response is "NO", the following Medicare IM given date fields will be blank)  Per UR Regulation:  Reviewed for med. necessity/level of care/duration of stay  Comments:  4/16/2015Mindi Slicker RN,BSN,MHA [phone]

## 2013-05-27 NOTE — Evaluation (Signed)
Physical Therapy Evaluation Patient Details Name: Jason Velazquez MRN: [id number] DOB: 03-Sep-1928 Today's Date: 05/27/2013   History of Present Illness  Admitted with new L arm weakness and incoordination.  MRI shows small pontine infarct.  As of eval pt reports resolution of symptoms,  Clinical Impression  Patient evaluated by Physical Therapy and fortunately no further acute PT needs identified.  No follow up needs.   PT is signing off. Thank you for this referral.     Follow Up Recommendations No PT follow up    Equipment Recommendations       Recommendations for Other Services       Precautions / Restrictions        Mobility  Bed Mobility Overal bed mobility: Modified Independent                Transfers Overall transfer level: Modified independent                  Ambulation/Gait Ambulation/Gait assistance: Modified independent (Device/Increase time) Ambulation Distance (Feet): 500 Feet Assistive device: None Gait Pattern/deviations: WFL(Within Functional Limits)   Gait velocity interpretation: at or above normal speed for age/gender General Gait Details: steady and safe see DGI  Stairs Stairs: Yes Stairs assistance: Modified independent (Device/Increase time) Stair Management: No rails;One rail Right;Alternating pattern;Forwards Number of Stairs: 6 General stair comments: no rails needed up and rails used down  Wheelchair Mobility    Modified Rankin (Stroke Patients Only) Modified Rankin (Stroke Patients Only) Pre-Morbid Rankin Score: No symptoms Modified Rankin: No symptoms     Balance Overall balance assessment: No apparent balance deficits (not formally assessed)                               Standardized Balance Assessment Standardized Balance Assessment : Berg Balance Test;Dynamic Gait Index Berg Balance Test Sit to Stand: Able to stand without using hands and stabilize independently Standing Unsupported: Able to  stand safely 2 minutes Sitting with Back Unsupported but Feet Supported on Floor or Stool: Able to sit safely and securely 2 minutes Stand to Sit: Sits safely with minimal use of hands Transfers: Able to transfer safely, minor use of hands Standing Unsupported with Eyes Closed: Able to stand 10 seconds safely Standing Ubsupported with Feet Together: Able to place feet together independently and stand 1 minute safely From Standing, Reach Forward with Outstretched Arm: Can reach confidently >25 cm (10") From Standing Position, Pick up Object from Floor: Able to pick up shoe safely and easily From Standing Position, Turn to Look Behind Over each Shoulder: Looks behind from both sides and weight shifts well Turn 360 Degrees: Able to turn 360 degrees safely one side only in 4 seconds or less Standing Unsupported, Alternately Place Feet on Step/Stool: Able to stand independently and complete 8 steps >20 seconds Standing Unsupported, One Foot in Front: Able to place foot tandem independently and hold 30 seconds Standing on One Leg: Able to lift leg independently and hold equal to or more than 3 seconds Total Score: 52 Dynamic Gait Index Level Surface: Normal Change in Gait Speed: Normal Gait with Horizontal Head Turns: Normal Gait with Vertical Head Turns: Normal Gait and Pivot Turn: Normal Step Over Obstacle: Mild Impairment Step Around Obstacles: Normal Steps: Mild Impairment Total Score: 22       Pertinent Vitals/Pain     Home Living Family/patient expects to be discharged to:: Private residence Living Arrangements: Spouse/significant other Available Help  at Discharge: Family Type of Home: House Home Access: Stairs to enter Entrance Stairs-Rails: None Entrance Stairs-Number of Steps: 3 Home Layout: One level Home Equipment: None      Prior Function Level of Independence: Independent               Hand Dominance        Extremity/Trunk Assessment   Upper Extremity  Assessment: Overall WFL for tasks assessed           Lower Extremity Assessment: Overall WFL for tasks assessed         Communication   Communication: No difficulties  Cognition Arousal/Alertness: Awake/alert Behavior During Therapy: WFL for tasks assessed/performed Overall Cognitive Status: Within Functional Limits for tasks assessed                      General Comments General comments (skin integrity, edema, etc.): Both BERG and DGI show lower and little risk for falls respectively    Exercises        Assessment/Plan    PT Assessment Patient needs continued PT services  PT Diagnosis     PT Problem List    PT Treatment Interventions     PT Goals (Current goals can be found in the Care Plan section) Acute Rehab PT Goals PT Goal Formulation: No goals set, d/c therapy    Frequency     Barriers to discharge        Co-evaluation               End of Session   Activity Tolerance: Patient tolerated treatment well Patient left: in bed;with family/visitor present Nurse Communication: Mobility status         Time: 2992-4268 PT Time Calculation (min): 24 min   Charges:   PT Evaluation $Initial PT Evaluation Tier I: 1 Procedure PT Treatments $Gait Training: 8-22 mins   PT G CodesTessie Fass Storey Stangeland 05/27/2013, 4:00 PM 05/27/2013  Donnella Sham, PT [phone]  (pager)

## 2013-05-28 ENCOUNTER — Telehealth: Payer: Self-pay | Admitting: Neurology

## 2013-05-28 NOTE — Telephone Encounter (Signed)
Called pt and spoke with pt's wife [name] and she informed me that the pt already has an appt set up for 06/01/13, in the research dept. I advised the pt's wife that if the pt has any other problems, questions or concerns to call the office. Pt's wife verbalized understanding.

## 2013-05-28 NOTE — Telephone Encounter (Signed)
Patient's wife calling to schedule a 2 month hospital follow up with Dr. Leonie Man. Offered patient first available in July which patient's wife did not want. Patient's wife states that patient is in a 90 day study for the medication Ticagrelor and states that it needs to be 2 months. Please call and advise.

## 2013-05-31 DIAGNOSIS — I6990 Unspecified sequelae of unspecified cerebrovascular disease: Secondary | ICD-10-CM | POA: Diagnosis not present

## 2013-05-31 DIAGNOSIS — I10 Essential (primary) hypertension: Secondary | ICD-10-CM | POA: Diagnosis not present

## 2013-06-01 ENCOUNTER — Encounter (INDEPENDENT_AMBULATORY_CARE_PROVIDER_SITE_OTHER): Payer: Self-pay

## 2013-06-01 DIAGNOSIS — Z0289 Encounter for other administrative examinations: Secondary | ICD-10-CM

## 2013-06-02 ENCOUNTER — Ambulatory Visit (INDEPENDENT_AMBULATORY_CARE_PROVIDER_SITE_OTHER): Payer: Medicare Other | Admitting: Podiatry

## 2013-06-02 ENCOUNTER — Encounter: Payer: Self-pay | Admitting: Podiatry

## 2013-06-02 ENCOUNTER — Ambulatory Visit (INDEPENDENT_AMBULATORY_CARE_PROVIDER_SITE_OTHER): Payer: Medicare Other

## 2013-06-02 VITALS — BP 157/62 | HR 64 | Resp 16

## 2013-06-02 DIAGNOSIS — M779 Enthesopathy, unspecified: Secondary | ICD-10-CM

## 2013-06-02 DIAGNOSIS — I2510 Atherosclerotic heart disease of native coronary artery without angina pectoris: Secondary | ICD-10-CM

## 2013-06-02 DIAGNOSIS — M7750 Other enthesopathy of unspecified foot: Secondary | ICD-10-CM

## 2013-06-02 MED ORDER — TRIAMCINOLONE ACETONIDE 10 MG/ML IJ SUSP
10.0000 mg | Freq: Once | INTRAMUSCULAR | Status: AC
Start: 2013-06-02 — End: 2013-06-02
  Administered 2013-06-02: 10 mg

## 2013-06-02 NOTE — Progress Notes (Signed)
   Subjective:    Patient ID: Jason Velazquez, male    DOB: Apr 14, 1928, 80 y.o.   MRN: [id number]  HPI Comments: "I have pain in this foot"  Patient c/o aching along the medial and lateral side, more dorsally, right foot for 2 months. He doesn't remember a specific injury. There has been NO swelling, redness, or bruising. He is active in walking for exercise, about 2 miles daily. He has been wrapping with ace bandage. No help.     Review of Systems  HENT: Positive for hearing loss and tinnitus.   Eyes: Positive for visual disturbance.  Hematological: Bruises/bleeds easily.  All other systems reviewed and are negative.      Objective:   Physical Exam        Assessment & Plan:

## 2013-06-03 NOTE — Progress Notes (Signed)
Subjective:     Patient ID: Jason Velazquez, male   DOB: 1928/11/20, 80 y.o.   MRN: [id number]  Foot Pain   patient presents stating my right foot is hurting me on the inside top and outside. Thinks that it started on the inside and gradually move to the other areas as he was not able to walk properly on this area. Does not remember specific injury   Review of Systems  All other systems reviewed and are negative.      Objective:   Physical Exam  Nursing note and vitals reviewed. Constitutional: He is oriented to person, place, and time.  Cardiovascular: Intact distal pulses.   Musculoskeletal: Normal range of motion.  Neurological: He is oriented to person, place, and time.  Skin: Skin is warm.   neurovascular status intact with muscle strength adequate and inflammation noted within the posterior tibial insertion right. I noted some depression of the arch of both feet and did not note dysfunction of the tendon and digits were found to be well perfused. It is painful during the ambulation cycle     Assessment:     Posterior tibial insertional tendinitis right with flatfoot deformity part of the problem    Plan:     H&P and x-rays reviewed with patient. Careful sheath injection performed 3 mg Kenalog 5 mg Xylocaine Marcaine mixture and I applied fascially brace to with the arch. Advised on reduced activity and possibility for long-term orthotics and reappoint in 2 weeks

## 2013-06-17 ENCOUNTER — Encounter: Payer: Self-pay | Admitting: Podiatry

## 2013-06-17 ENCOUNTER — Ambulatory Visit (INDEPENDENT_AMBULATORY_CARE_PROVIDER_SITE_OTHER): Payer: Medicare Other | Admitting: Podiatry

## 2013-06-17 VITALS — BP 139/79 | HR 62 | Resp 15

## 2013-06-17 DIAGNOSIS — M7750 Other enthesopathy of unspecified foot: Secondary | ICD-10-CM

## 2013-06-17 DIAGNOSIS — I2510 Atherosclerotic heart disease of native coronary artery without angina pectoris: Secondary | ICD-10-CM | POA: Diagnosis not present

## 2013-06-17 NOTE — Progress Notes (Signed)
   Subjective:    Patient ID: Jason Velazquez, male    DOB: 07/14/1928, 80 y.o.   MRN: [id number]  HPI  Follow up, from pain in arch of right foot, still hurting but has noted some improvement  Review of Systems     Objective:   Physical Exam        Assessment & Plan:

## 2013-06-17 NOTE — Progress Notes (Signed)
Subjective:     Patient ID: Jason Velazquez, male   DOB: [birth year], 80 y.o.   MRN: [id number]  HPI patient presents stating he is having quite a bit of improvement in his right foot but it still gets tender if she's on it too long   Review of Systems     Objective:   Physical Exam Neurovascular S. intact with tendinitis of the right posterior tibial tendon still present but dramatically improved from last week    Assessment:     Tendinitis right posterior tibial that is doing much better but still mild tenderness    Plan:     Advised on physical therapy and over-the-counter insoles with possibility for custom orthotics if symptoms persist

## 2013-06-23 DIAGNOSIS — D219 Benign neoplasm of connective and other soft tissue, unspecified: Secondary | ICD-10-CM | POA: Diagnosis not present

## 2013-06-23 DIAGNOSIS — L570 Actinic keratosis: Secondary | ICD-10-CM | POA: Diagnosis not present

## 2013-06-23 DIAGNOSIS — Z85828 Personal history of other malignant neoplasm of skin: Secondary | ICD-10-CM | POA: Diagnosis not present

## 2013-06-23 DIAGNOSIS — L821 Other seborrheic keratosis: Secondary | ICD-10-CM | POA: Diagnosis not present

## 2013-06-23 DIAGNOSIS — Z85820 Personal history of malignant melanoma of skin: Secondary | ICD-10-CM | POA: Diagnosis not present

## 2013-06-23 DIAGNOSIS — D239 Other benign neoplasm of skin, unspecified: Secondary | ICD-10-CM | POA: Diagnosis not present

## 2013-06-30 ENCOUNTER — Ambulatory Visit (INDEPENDENT_AMBULATORY_CARE_PROVIDER_SITE_OTHER): Payer: Medicare Other | Admitting: Podiatry

## 2013-06-30 ENCOUNTER — Encounter: Payer: Self-pay | Admitting: Podiatry

## 2013-06-30 VITALS — BP 162/67 | HR 61 | Resp 16

## 2013-06-30 DIAGNOSIS — I2510 Atherosclerotic heart disease of native coronary artery without angina pectoris: Secondary | ICD-10-CM

## 2013-06-30 DIAGNOSIS — M7750 Other enthesopathy of unspecified foot: Secondary | ICD-10-CM

## 2013-06-30 NOTE — Progress Notes (Signed)
Subjective:     Patient ID: Jason Velazquez, male   DOB: 1928-06-08, 80 y.o.   MRN: [id number]  HPI patient presents stating I'm feeling some better today but yesterday was awful   Review of Systems     Objective:   Physical Exam Neurovascular status intact no changes in health history with discomfort at the posterior tibial insertion right    Assessment:     Chronic posterior tibial tendinitis with foot structure being a part of the problem patient's experienced    Plan:     Explained the condition and recommended long-term orthotics to reduce stress against the area with possibility for future injections. Scanned for custom orthotics

## 2013-07-23 ENCOUNTER — Ambulatory Visit (INDEPENDENT_AMBULATORY_CARE_PROVIDER_SITE_OTHER): Payer: Medicare Other | Admitting: *Deleted

## 2013-07-23 DIAGNOSIS — M779 Enthesopathy, unspecified: Secondary | ICD-10-CM

## 2013-07-23 NOTE — Progress Notes (Signed)
   Subjective:    Patient ID: Jason Velazquez, male    DOB: [birth year], 80 y.o.   MRN: [id number]  HPI  PICK UP ORTHOTICS AND GIVEN INSTRUCTION.    Review of Systems     Objective:   Physical Exam        Assessment & Plan:

## 2013-07-23 NOTE — Patient Instructions (Signed)

## 2013-07-28 ENCOUNTER — Ambulatory Visit (INDEPENDENT_AMBULATORY_CARE_PROVIDER_SITE_OTHER): Payer: Medicare Other | Admitting: Podiatry

## 2013-07-28 ENCOUNTER — Encounter: Payer: Self-pay | Admitting: Podiatry

## 2013-07-28 VITALS — BP 135/78 | HR 78 | Resp 16

## 2013-07-28 DIAGNOSIS — M7750 Other enthesopathy of unspecified foot: Secondary | ICD-10-CM

## 2013-07-28 MED ORDER — TRIAMCINOLONE ACETONIDE 10 MG/ML IJ SUSP
10.0000 mg | Freq: Once | INTRAMUSCULAR | Status: AC
Start: 2013-07-28 — End: 2013-07-28
  Administered 2013-07-28: 10 mg

## 2013-07-28 NOTE — Progress Notes (Signed)
   Subjective:    Patient ID: Jason Velazquez, male    DOB: [birth year], 79 y.o.   MRN: [id number]  HPI  Right foot pain has returned and gotten worse. Pain is in the arch of the foot and worsens with walking. Pt has used orthotics but states that the orthotics hurt his right foot. Pt states relief from pain with injection from last visit  Review of Systems     Objective:   Physical Exam        Assessment & Plan:

## 2013-07-29 ENCOUNTER — Ambulatory Visit: Payer: Medicare Other | Admitting: Podiatry

## 2013-07-29 NOTE — Progress Notes (Signed)
Subjective:     Patient ID: Jason Velazquez, male   DOB: 1928-11-22, 80 y.o.   MRN: [id number]  HPI patient presents stating I'm getting a lot of pain in my right arch that has not resolved and I feel like I'm unstable   Review of Systems     Objective:   Physical Exam Neurovascular status remains the same and patient is well oriented x3. Patient is found to have discomfort at the posterior tibial insertion right with inflammation and fluid buildup and mild depression of the arch noted    Assessment:     Continued posterior tibial tendinitis which has not resolved with previous conservative care and orthotics    Plan:     Careful injection of the medial area 3 mg Kenalog 5 mg I can Marcaine mixture and dispensed air fracture walker to completely immobilize the foot and lower leg and to prevent any: The bone. Reappoint to recheck again in 2 weeks

## 2013-08-09 DIAGNOSIS — H6120 Impacted cerumen, unspecified ear: Secondary | ICD-10-CM | POA: Diagnosis not present

## 2013-08-09 DIAGNOSIS — H9110 Presbycusis, unspecified ear: Secondary | ICD-10-CM | POA: Diagnosis not present

## 2013-08-11 ENCOUNTER — Encounter: Payer: Self-pay | Admitting: Podiatry

## 2013-08-11 ENCOUNTER — Ambulatory Visit (INDEPENDENT_AMBULATORY_CARE_PROVIDER_SITE_OTHER): Payer: Medicare Other | Admitting: Podiatry

## 2013-08-11 VITALS — BP 130/80 | HR 74 | Resp 16

## 2013-08-11 DIAGNOSIS — M779 Enthesopathy, unspecified: Secondary | ICD-10-CM

## 2013-08-11 DIAGNOSIS — I2510 Atherosclerotic heart disease of native coronary artery without angina pectoris: Secondary | ICD-10-CM | POA: Diagnosis not present

## 2013-08-11 NOTE — Progress Notes (Signed)
   Subjective:    Patient ID: Jason Velazquez, male    DOB: 1928/07/06, 80 y.o.   MRN: [id number]  HPI  Pt states that he has fe;t some relief from injection last visit, right foot is in less pain  Review of Systems     Objective:   Physical Exam        Assessment & Plan:

## 2013-08-12 NOTE — Progress Notes (Signed)
Subjective:     Patient ID: Jason Velazquez, male   DOB: October 14, 1928, 80 y.o.   MRN: [id number]  HPI patient presents stating he had in relief from his last injection in his walking well at this time   Review of Systems     Objective:   Physical Exam Neurovascular status is intact muscle strength is adequate with good structure noted of the underlying metatarsal bone    Assessment:     Improving from tendinitis    Plan:      advised on physical therapy and shoe gear modifications and discussed gradual increase in activities over the next 4 weeks

## 2013-08-25 ENCOUNTER — Encounter (INDEPENDENT_AMBULATORY_CARE_PROVIDER_SITE_OTHER): Payer: Self-pay

## 2013-08-25 DIAGNOSIS — Z0289 Encounter for other administrative examinations: Secondary | ICD-10-CM

## 2013-09-16 NOTE — Telephone Encounter (Signed)
Noted  

## 2013-09-21 ENCOUNTER — Emergency Department (HOSPITAL_COMMUNITY)
Admission: EM | Admit: 2013-09-21 | Discharge: 2013-09-21 | Disposition: A | Discharge status: Home / Self Care | Payer: Medicare Other | Attending: Emergency Medicine | Admitting: Emergency Medicine

## 2013-09-21 ENCOUNTER — Emergency Department (HOSPITAL_COMMUNITY): Payer: Medicare Other

## 2013-09-21 ENCOUNTER — Encounter (HOSPITAL_COMMUNITY): Payer: Self-pay | Admitting: Emergency Medicine

## 2013-09-21 DIAGNOSIS — I2510 Atherosclerotic heart disease of native coronary artery without angina pectoris: Secondary | ICD-10-CM | POA: Diagnosis not present

## 2013-09-21 DIAGNOSIS — M19019 Primary osteoarthritis, unspecified shoulder: Secondary | ICD-10-CM | POA: Diagnosis not present

## 2013-09-21 DIAGNOSIS — Z7982 Long term (current) use of aspirin: Secondary | ICD-10-CM | POA: Insufficient documentation

## 2013-09-21 DIAGNOSIS — K219 Gastro-esophageal reflux disease without esophagitis: Secondary | ICD-10-CM | POA: Insufficient documentation

## 2013-09-21 DIAGNOSIS — Z79899 Other long term (current) drug therapy: Secondary | ICD-10-CM | POA: Insufficient documentation

## 2013-09-21 DIAGNOSIS — Z951 Presence of aortocoronary bypass graft: Secondary | ICD-10-CM | POA: Diagnosis not present

## 2013-09-21 DIAGNOSIS — Z87448 Personal history of other diseases of urinary system: Secondary | ICD-10-CM | POA: Insufficient documentation

## 2013-09-21 DIAGNOSIS — R9431 Abnormal electrocardiogram [ECG] [EKG]: Secondary | ICD-10-CM | POA: Diagnosis not present

## 2013-09-21 DIAGNOSIS — Z9889 Other specified postprocedural states: Secondary | ICD-10-CM | POA: Diagnosis not present

## 2013-09-21 DIAGNOSIS — I10 Essential (primary) hypertension: Secondary | ICD-10-CM | POA: Insufficient documentation

## 2013-09-21 DIAGNOSIS — Z8673 Personal history of transient ischemic attack (TIA), and cerebral infarction without residual deficits: Secondary | ICD-10-CM | POA: Insufficient documentation

## 2013-09-21 DIAGNOSIS — M79609 Pain in unspecified limb: Secondary | ICD-10-CM | POA: Insufficient documentation

## 2013-09-21 DIAGNOSIS — R079 Chest pain, unspecified: Secondary | ICD-10-CM | POA: Diagnosis not present

## 2013-09-21 DIAGNOSIS — E785 Hyperlipidemia, unspecified: Secondary | ICD-10-CM

## 2013-09-21 DIAGNOSIS — R011 Cardiac murmur, unspecified: Secondary | ICD-10-CM | POA: Diagnosis not present

## 2013-09-21 DIAGNOSIS — E7800 Pure hypercholesterolemia, unspecified: Secondary | ICD-10-CM | POA: Insufficient documentation

## 2013-09-21 DIAGNOSIS — M25519 Pain in unspecified shoulder: Secondary | ICD-10-CM | POA: Diagnosis not present

## 2013-09-21 DIAGNOSIS — R918 Other nonspecific abnormal finding of lung field: Secondary | ICD-10-CM | POA: Diagnosis not present

## 2013-09-21 DIAGNOSIS — M79602 Pain in left arm: Secondary | ICD-10-CM

## 2013-09-21 HISTORY — DX: Cerebral infarction, unspecified: I63.9

## 2013-09-21 LAB — CBC
HCT: 41.5 % (ref 39.0–52.0)
Hemoglobin: 14.3 g/dL (ref 13.0–17.0)
MCH: 31.8 pg (ref 26.0–34.0)
MCHC: 34.5 g/dL (ref 30.0–36.0)
MCV: 92.2 fL (ref 78.0–100.0)
PLATELETS: 191 10*3/uL (ref 150–400)
RBC: 4.5 MIL/uL (ref 4.22–5.81)
RDW: 12.6 % (ref 11.5–15.5)
WBC: 4.6 10*3/uL (ref 4.0–10.5)

## 2013-09-21 LAB — COMPREHENSIVE METABOLIC PANEL
ALK PHOS: 57 U/L (ref 39–117)
ALT: 17 U/L (ref 0–53)
AST: 20 U/L (ref 0–37)
Albumin: 3.5 g/dL (ref 3.5–5.2)
Anion gap: 8 (ref 5–15)
BUN: 12 mg/dL (ref 6–23)
CALCIUM: 8.9 mg/dL (ref 8.4–10.5)
CO2: 28 meq/L (ref 19–32)
Chloride: 98 mEq/L (ref 96–112)
Creatinine, Ser: 0.92 mg/dL (ref 0.50–1.35)
GFR, EST AFRICAN AMERICAN: 88 mL/min — AB (ref >90)
GFR, EST NON AFRICAN AMERICAN: 76 mL/min — AB (ref >90)
GLUCOSE: 93 mg/dL (ref 70–99)
POTASSIUM: 4.8 meq/L (ref 3.7–5.3)
SODIUM: 134 meq/L — AB (ref 137–147)
Total Bilirubin: 0.6 mg/dL (ref 0.3–1.2)
Total Protein: 6.3 g/dL (ref 6.0–8.3)

## 2013-09-21 LAB — DIFFERENTIAL
Basophils Absolute: 0 10*3/uL (ref 0.0–0.1)
Basophils Relative: 1 % (ref 0–1)
EOS PCT: 2 % (ref 0–5)
Eosinophils Absolute: 0.1 10*3/uL (ref 0.0–0.7)
Lymphocytes Relative: 18 % (ref 12–46)
Lymphs Abs: 0.8 10*3/uL (ref 0.7–4.0)
Monocytes Absolute: 0.6 10*3/uL (ref 0.1–1.0)
Monocytes Relative: 14 % — ABNORMAL HIGH (ref 3–12)
Neutro Abs: 3 10*3/uL (ref 1.7–7.7)
Neutrophils Relative %: 65 % (ref 43–77)

## 2013-09-21 LAB — I-STAT TROPONIN, ED: Troponin i, poc: 0 ng/mL (ref 0.00–0.08)

## 2013-09-21 LAB — PROTIME-INR
INR: 1.07 (ref 0.00–1.49)
PROTHROMBIN TIME: 13.9 s (ref 11.6–15.2)

## 2013-09-21 LAB — APTT: aPTT: 26 seconds (ref 24–37)

## 2013-09-21 NOTE — ED Provider Notes (Signed)
CSN: [id number]     Arrival date & time 09/21/13  0804 History   First MD Initiated Contact with Patient 09/21/13 0804     Chief Complaint  Patient presents with  . Extremity Pain     (Consider location/radiation/quality/duration/timing/severity/associated sxs/prior Treatment) HPI Comments: Jason Velazquez is a 80 y.o. Male with a PMHx of HTN, HLD, GERD, CAD, MI s/p CABG x4 with drug eluding stent (2000 then repeat in 2006), mild mitral regurg, L carotid stenosis, and CVA in 05/2013, who presents to the ED today with complaints of 5/10 achy L shoulder/deltoid pain radiating down the L arm to mid-forearm which began at rest at 7am, is constant but resolved to a 1/10 after 324mg  ASA chewed and 0.4mg  SL NTG, and has no known aggravating factors. Associated symptoms include diaphoresis at the time of the event which has also subsided now. Denies any fevers, chills, neck pain, HA, vision changes, paresthesias, weakness, DOE, SOB, CP/chest tightness or pressure, radiation to left arm, jaw or back, URI symptoms, abd pain, N/V/D/C, myalgias, confusion, or vertigo. Denies any known injuries to this arm. Denies that this feels like his past MI/CVA. Currently takes Plavix, Atenolol, Lipitor, and ASA, without any recent changes in medications. States he was on an experimental drug after his CVA in April, but has been off of it for several weeks.   Patient is a 80 y.o. male presenting with extremity pain. The history is provided by the patient. No language interpreter was used.  Extremity Pain This is a new problem. The current episode started today. The problem occurs constantly. The problem has been rapidly improving. Associated symptoms include arthralgias (L deltoid region/shoulder) and diaphoresis (during onset of pain, now resolved). Pertinent negatives include no abdominal pain, chest pain, chills, congestion, coughing, fever, headaches, joint swelling, myalgias, nausea, neck pain, numbness, sore throat,  swollen glands, urinary symptoms, vertigo, visual change, vomiting or weakness. Nothing aggravates the symptoms. Treatments tried: ASA 324mg  and SL NTG 0.4mg . The treatment provided significant relief.    Past Medical History  Diagnosis Date  . Hypertension   . Hypercholesteremia   . Heart murmur   . Benign prostatic hypertrophy   . GERD (gastroesophageal reflux disease)   . History of melanoma excision     FACE  . Perianal fistula   . S/P CABG x 4     1988  . S/P drug eluting coronary stent placement     POST CABG--  STENTING 2000  AND RESTENTING 2006 FOR REINSTENT STENOSIS  . Coronary artery disease CARDIOLOGIST -- DR Daneen Schick  . Allergic rhinitis   . H/O hiatal hernia   . Mild mitral regurgitation   . Left carotid stenosis     > 50%  PER DUPLEX  03-28-2011  . Stroke    Past Surgical History  Procedure Laterality Date  . Transurethral resection of prostate  04/09/2011    Procedure: TRANSURETHRAL RESECTION OF THE PROSTATE WITH GYRUS INSTRUMENTS;  Surgeon: Malka So, MD;  Location: [facility];  Service: Urology;  Laterality: N/A;  . Cataract extraction w/ intraocular lens  implant, bilateral    . Tonsillectomy and adenoidectomy  1944  . Shoulder arthroscopy with open rotator cuff repair Right 07-10-1999  . Coronary angioplasty with stent placement  2000    PCI AND STENTING CIRCUMFLEX  . Coronary angioplasty with stent placement  04-30-2004  DR Daneen Schick    RE-INSTENT STENOSIS/  DRUG-ELUTING STENT OF PROXIMAL AND MID CIRCUMFLEX/ PCI MID CIRCUMFLEX THROUGH STENT STRUT/  WIDELY PATENT SAPHENOUS VEIN GRAFT AND  LIMA TO LAD GRAFT/ TOTAL OCCLUSION RIGHT CORONARY BEYOND THE POSTERIOR DESCENDING ARTERY BRANCH WITH 50% OSTIAL NARROWING/ TOTAL OCCLUSION OF LAD AT THE OSTIUM OF THE LEFT MAIN  . Coronary artery bypass graft  1988    X5  . Transthoracic echocardiogram  03-01-2011  DR Daneen Schick    NORMAL LVF AND LV SIZE/ MILD LEFT ATRIAL ENLARGEMENT/ GRADE II DIASTOLIC DYSFUNCTION WITH  ELEVATED LEFT ATRIAL PRESSURE/ MILD TO MODERATE MR/ MILD TR  . Evaluation under anesthesia with anal fistulectomy N/A 07/22/2012    Procedure: EXAM UNDER ANESTHESIA WITH ANAL fistulotomy;  Surgeon: Leighton Ruff, MD;  Location: [place];  Service: General;  Laterality: N/A;   Family History  Problem Relation Age of Onset  . Cancer Mother     breast  . Cancer Brother     colon   History  Substance Use Topics  . Smoking status: Never Smoker   . Smokeless tobacco: Never Used  . Alcohol Use: No    Review of Systems  Constitutional: Positive for diaphoresis (during onset of pain, now resolved). Negative for fever and chills.  HENT: Negative for congestion, ear pain, sinus pressure, sore throat and tinnitus.   Eyes: Negative for photophobia, pain, redness and visual disturbance.  Respiratory: Negative for cough, chest tightness and shortness of breath.   Cardiovascular: Negative for chest pain, palpitations and leg swelling.  Gastrointestinal: Negative for nausea, vomiting and abdominal pain.  Musculoskeletal: Positive for arthralgias (L deltoid region/shoulder). Negative for gait problem, joint swelling, myalgias, neck pain and neck stiffness.  Skin: Negative for color change.  Neurological: Negative for dizziness, vertigo, tremors, syncope, facial asymmetry, speech difficulty, weakness, light-headedness, numbness and headaches.  Psychiatric/Behavioral: Negative for confusion.  10 Systems reviewed and are negative for acute change except as noted in the HPI.     Allergies  Iodine; Niacin and related; and Adhesive  Home Medications   Prior to Admission medications   Medication Sig Start Date End Date Taking? Authorizing Provider  aspirin 325 MG tablet Take 325 mg by mouth once.   Yes Historical Provider, MD  atenolol (TENORMIN) 25 MG tablet Take 25 mg by mouth daily after breakfast.   Yes Historical Provider, MD  atorvastatin (LIPITOR) 20 MG tablet Take 20 mg by  mouth at bedtime.   Yes Historical Provider, MD  clopidogrel (PLAVIX) 75 MG tablet Take 75 mg by mouth daily. 08/25/13  Yes Historical Provider, MD  docusate sodium (COLACE) 100 MG capsule Take 200 mg by mouth 2 (two) times daily.    Yes Historical Provider, MD  nitroGLYCERIN (NITROSTAT) 0.4 MG SL tablet Place 0.4 mg under the tongue every 5 (five) minutes as needed for chest pain.    Yes Historical Provider, MD  omeprazole (PRILOSEC) 20 MG capsule Take 20 mg by mouth daily. 05/12/13  Yes Historical Provider, MD  oxymetazoline (AFRIN) 0.05 % nasal spray Place 1 spray into both nostrils daily.   Yes Historical Provider, MD  polyethylene glycol (MIRALAX / GLYCOLAX) packet Take 17 g by mouth daily.   Yes Historical Provider, MD   BP 124/69  Pulse 58  Temp(Src) 97.6 F (36.4 C) (Oral)  Resp 16  SpO2 94% Physical Exam  Nursing note and vitals reviewed. Constitutional: He is oriented to person, place, and time. Vital signs are normal. He appears well-developed and well-nourished. No distress.  VSS, afebrile, NAD  HENT:  Head: Normocephalic and atraumatic.  Mouth/Throat: Uvula is midline and mucous membranes  are normal.  Eyes: Conjunctivae and EOM are normal. Pupils are equal, round, and reactive to light. Right eye exhibits no discharge. Left eye exhibits no discharge.  EOMI, PERRL, conjunctiva noninjected, visual fields intact  Neck: Trachea normal and normal range of motion. Neck supple. Normal carotid pulses and no JVD present. No spinous process tenderness and no muscular tenderness present. Carotid bruit is not present. No rigidity. No tracheal deviation and normal range of motion present.  FROM intact without spinous process or paraspinous muscle TTP, no rigidity or meningeal signs. No carotid bruit or decreased carotid pulse, No JVD or tracheal deviation.  Cardiovascular: Normal rate, regular rhythm, normal heart sounds and intact distal pulses.  Exam reveals no gallop and no friction rub.    No murmur heard. RRR, nl s1/s2, no m/r/g, distal pulses intact in all 4 extremities.  Pulmonary/Chest: Effort normal and breath sounds normal. No respiratory distress. He has no decreased breath sounds. He has no wheezes. He has no rhonchi. He has no rales.  CTAB in all lung fields, no w/r/r  Abdominal: Soft. Normal appearance and bowel sounds are normal. He exhibits no distension. There is no tenderness. There is no rigidity, no rebound and no guarding.  Musculoskeletal: Normal range of motion.       Left shoulder: Normal. He exhibits normal range of motion, no tenderness, no bony tenderness, no swelling, no crepitus, no deformity, no pain, no spasm, normal pulse and normal strength.       Left elbow: Normal.       Left wrist: Normal.       Left hand: He exhibits normal range of motion, no tenderness, normal two-point discrimination and normal capillary refill. Normal sensation noted. Normal strength noted.  FROM in all major joints including L shoulder. L shoulder nonTTP along joint lines, AC joint, and deltoid muscle, L elbow nonTTP. No LUE swelling, deformity, or crepitus. Neg apley scratch, int/ext rotation with resistance, empty can sign, and cross-arm test in B/L UEs. Strength 5/5 in all extremities, including with finger abduction, wrist extension, thumb/finger opposition. Sensation grossly intact along axillary, ulnar, radial, and median nerve distributions bilaterally, BLE sensation grossly intact with 5/5 strength. Gait nonataxic.   Neurological: He is alert and oriented to person, place, and time. He has normal strength and normal reflexes. No cranial nerve deficit or sensory deficit. He displays a negative Romberg sign. Coordination and gait normal. GCS eye subscore is 4. GCS verbal subscore is 5. GCS motor subscore is 6.  CN 2-12 grossly intact. Baseline strength 5/5 in all extremities, sensation grossly intact in all extremities, neg romberg, nonataxic gait, no cerebellar signs or  abnormal coordination, DTRs equal and symmetric bilaterally  Skin: Skin is warm, dry and intact. No rash noted.  No rashes  Psychiatric: He has a normal mood and affect.    ED Course  Procedures (including critical care time) Labs Review Labs Reviewed  DIFFERENTIAL - Abnormal; Notable for the following:    Monocytes Relative 14 (*)    All other components within normal limits  COMPREHENSIVE METABOLIC PANEL - Abnormal; Notable for the following:    Sodium 134 (*)    GFR calc non Af Amer 76 (*)    GFR calc Af Amer 88 (*)    All other components within normal limits  PROTIME-INR  APTT  CBC  I-STAT TROPOININ, ED    Imaging Review Dg Chest 2 View  09/21/2013   CLINICAL DATA:  Extremity pain  EXAM: CHEST  2  VIEW  COMPARISON:  04/03/2011  FINDINGS: Cardiomediastinal silhouette is stable. Status post median sternotomy. No acute infiltrate or pleural effusion. No pulmonary edema. Mild degenerative changes thoracic spine.  IMPRESSION: No active cardiopulmonary disease.   Electronically Signed   By: Lahoma Crocker M.D.   On: 09/21/2013 09:30   Dg Shoulder Left  09/21/2013   CLINICAL DATA:  Extremity pain  EXAM: LEFT SHOULDER - 2+ VIEW  COMPARISON:  04/13/2010  FINDINGS: Two views of left shoulder submitted. No acute fracture or subluxation. Mild degenerative changes left AC joint. Glenohumeral joint is preserved.  IMPRESSION: No acute fracture or subluxation. Mild degenerative changes left AC joint.   Electronically Signed   By: Lahoma Crocker M.D.   On: 09/21/2013 09:30     EKG Interpretation   Date/Time:  Tuesday September 21 2013 08:12:15 EDT Ventricular Rate:  61 PR Interval:  136 QRS Duration: 88 QT Interval:  405 QTC Calculation: 408 R Axis:   55 Text Interpretation:  Sinus rhythm T wave abnormality Lateral leads When  compared with ECG of 05/26/2013 No significant change was found Confirmed  by [facility]  MD, Nunzio Cory [phone]) on 09/21/2013 8:52:57 AM      MDM   Final diagnoses:  None     80y/o male with extensive cardiac and CVA history presenting for isolated L shoulder/deltoid aching at rest relieved with NTG and ASA. Neuro exam and MsK exam benign. Will obtain cardiac and stroke labs, EKG, CXR, L shoulder xray, swallow study, and reassess. Will hold on Head CT at this time, doubt CVA.  9:20 AM EKG WNL unchanged from previous, 1st troponin (2hrs post-onset) neg, INR WNL, aPTT WNL, CBC w/diff WNL, CMP showing mildly low sodium and GFR but at baseline. CXR neg, Xray showing L AC joint degeneration. HEART score 4, and given pt's PMHx, likely admission for overnight OBS and deltatrop. Discussed case with Dr. Thurnell Garbe, who will consult cardiology for admission. Please see her dictation for further documentation of care.  12:05 PM Cardiology saw pt, who do not feel admission is warranted at this time, and do not feel he needs to have deltatrops. Pt to be discharged home with close follow up. Stable and improved at time of discharge.  BP 124/69  Pulse 58  Temp(Src) 97.6 F (36.4 C) (Oral)  Resp 16  SpO2 94%    [employer], PA-C 09/21/13 1206

## 2013-09-21 NOTE — Consult Note (Signed)
Admit date: 09/21/2013 Referring Physician  Dr. Thurnell Garbe Primary Physician Mathews Argyle, MD Primary Cardiologist  Dr. Tamala Julian Reason for Consultation  ?Angina  HPI: 80 year old patient of Dr. Thompson Caul with prior bypass surgery in 1998 with subsequent stent placement, stroke, hypertension, hyperlipidemia,  who was brought to the emergency room with sudden onset throbbing, 5/10 aching pain in his left arm below the shoulder and extended to the middle part of his forearm. He was reading when the pain occurred. He may have appeared clammy and diaphoretic on the onset of arm pain and he took an aspirin and nitroglycerin x1 prior to EMS arrival. He tells me that nitroglycerin did not change his symptoms. He may have told this to Dr. Thurnell Garbe however.  He does not believe that this felt like his prior myocardial infarction/anginal symptoms. He is currently compliant with his medications which include Plavix, statin, beta blocker. He is now off of experimental drug after stroke.  He exercises daily on a treadmill and his wife says that he might overexert himself. His wife is a patient of Dr. Thompson Caul.  His EKG is unremarkable, no change. Troponin normal 2 hours post onset of discomfort. Chest x-ray shows left a.c. joint degeneration.  Currently he is resting in bed comfortable. At one point during our discussion he said he felt a throbbing in his upper arm, off and on while we were talking. There were no changes on telemetry.    PMH:   Past Medical History  Diagnosis Date  . Hypertension   . Hypercholesteremia   . Heart murmur   . Benign prostatic hypertrophy   . GERD (gastroesophageal reflux disease)   . History of melanoma excision     FACE  . Perianal fistula   . S/P CABG x 4     1988  . S/P drug eluting coronary stent placement     POST CABG--  STENTING 2000  AND RESTENTING 2006 FOR REINSTENT STENOSIS  . Coronary artery disease CARDIOLOGIST -- DR Daneen Schick  . Allergic rhinitis    . H/O hiatal hernia   . Mild mitral regurgitation   . Left carotid stenosis     > 50%  PER DUPLEX  03-28-2011  . Stroke     PSH:   Past Surgical History  Procedure Laterality Date  . Transurethral resection of prostate  04/09/2011    Procedure: TRANSURETHRAL RESECTION OF THE PROSTATE WITH GYRUS INSTRUMENTS;  Surgeon: Malka So, MD;  Location: [facility];  Service: Urology;  Laterality: N/A;  . Cataract extraction w/ intraocular lens  implant, bilateral    . Tonsillectomy and adenoidectomy  1944  . Shoulder arthroscopy with open rotator cuff repair Right 07-10-1999  . Coronary angioplasty with stent placement  2000    PCI AND STENTING CIRCUMFLEX  . Coronary angioplasty with stent placement  04-30-2004  DR Daneen Schick    RE-INSTENT STENOSIS/  DRUG-ELUTING STENT OF PROXIMAL AND MID CIRCUMFLEX/ PCI MID CIRCUMFLEX THROUGH STENT STRUT/  WIDELY PATENT SAPHENOUS VEIN GRAFT AND  LIMA TO LAD GRAFT/ TOTAL OCCLUSION RIGHT CORONARY BEYOND THE POSTERIOR DESCENDING ARTERY BRANCH WITH 50% OSTIAL NARROWING/ TOTAL OCCLUSION OF LAD AT THE OSTIUM OF THE LEFT MAIN  . Coronary artery bypass graft  1988    X5  . Transthoracic echocardiogram  03-01-2011  DR Daneen Schick    NORMAL LVF AND LV SIZE/ MILD LEFT ATRIAL ENLARGEMENT/ GRADE II DIASTOLIC DYSFUNCTION WITH ELEVATED LEFT ATRIAL PRESSURE/ MILD TO MODERATE MR/ MILD TR  . Evaluation under  anesthesia with anal fistulectomy N/A 07/22/2012    Procedure: EXAM UNDER ANESTHESIA WITH ANAL fistulotomy;  Surgeon: Leighton Ruff, MD;  Location: [facility];  Service: General;  Laterality: N/A;   Allergies:  Iodine; Niacin and related; and Adhesive Prior to Admit Meds:   Prior to Admission medications   Medication Sig Start Date End Date Taking? Authorizing Provider  aspirin 325 MG tablet Take 325 mg by mouth once.   Yes Historical Provider, MD  atenolol (TENORMIN) 25 MG tablet Take 25 mg by mouth daily after breakfast.   Yes Historical Provider, MD    atorvastatin (LIPITOR) 20 MG tablet Take 20 mg by mouth at bedtime.   Yes Historical Provider, MD  clopidogrel (PLAVIX) 75 MG tablet Take 75 mg by mouth daily. 08/25/13  Yes Historical Provider, MD  docusate sodium (COLACE) 100 MG capsule Take 200 mg by mouth 2 (two) times daily.    Yes Historical Provider, MD  nitroGLYCERIN (NITROSTAT) 0.4 MG SL tablet Place 0.4 mg under the tongue every 5 (five) minutes as needed for chest pain.    Yes Historical Provider, MD  omeprazole (PRILOSEC) 20 MG capsule Take 20 mg by mouth daily. 05/12/13  Yes Historical Provider, MD  oxymetazoline (AFRIN) 0.05 % nasal spray Place 1 spray into both nostrils daily.   Yes Historical Provider, MD  polyethylene glycol (MIRALAX / GLYCOLAX) packet Take 17 g by mouth daily.   Yes Historical Provider, MD   Fam HX:    Family History  Problem Relation Age of Onset  . Cancer Mother     breast  . Cancer Brother     colon   Social HX:    History   Social History  . Marital Status: Married    Spouse Name: [name]    Number of Children: 3  . Years of Education: B.D.   Occupational History  . retired    Social History Main Topics  . Smoking status: Never Smoker   . Smokeless tobacco: Never Used  . Alcohol Use: No  . Drug Use: No  . Sexual Activity: No   Other Topics Concern  . Not on file   Social History Narrative  . No narrative on file     ROS:  Denies any coughing, fevers, shortness of breath, syncope, bleeding, new strokelike symptoms. All 11 ROS were addressed and are negative except what is stated in the HPI   Physical Exam: Blood pressure 147/86, pulse 69, temperature 97.6 F (36.4 C), temperature source Oral, resp. rate 19, SpO2 99.00%.   General: Well developed, well nourished, in no acute distress Head: Eyes PERRLA, No xanthomas.   Normal cephalic and atramatic  Lungs:   Clear bilaterally to auscultation and percussion. Normal respiratory effort. No wheezes, no rales. Heart:   HRRR S1 S2 Pulses  are 2+ & equal. No murmur, rubs, gallops.  No carotid bruit. No JVD.  No abdominal bruits.  Abdomen: Bowel sounds are positive, abdomen soft and non-tender without masses. No hepatosplenomegaly. Msk:  Back normal. Normal strength and tone for age. Shoulder was palpated as well as upper arm, left and no palpable masses, no straining with passive or active movement. No rashes. Extremities:  No clubbing, cyanosis or edema.  DP +1 Neuro: Alert and oriented X 3, non-focal, MAE x 4 GU: Deferred Rectal: Deferred Psych:  Good affect, responds appropriately      Labs: Lab Results  Component Value Date   WBC 4.6 09/21/2013   HGB 14.3 09/21/2013  HCT 41.5 09/21/2013   MCV 92.2 09/21/2013   PLT 191 09/21/2013     Recent Labs Lab 09/21/13 0850  NA 134*  K 4.8  CL 98  CO2 28  BUN 12  CREATININE 0.92  CALCIUM 8.9  PROT 6.3  BILITOT 0.6  ALKPHOS 57  ALT 17  AST 20  GLUCOSE 93    Lab Results  Component Value Date   CHOL 126 05/27/2013   HDL 40 05/27/2013   LDLCALC 69 05/27/2013   TRIG 85 05/27/2013      Radiology:  Dg Chest 2 View  09/21/2013   CLINICAL DATA:  Extremity pain  EXAM: CHEST  2 VIEW  COMPARISON:  04/03/2011  FINDINGS: Cardiomediastinal silhouette is stable. Status post median sternotomy. No acute infiltrate or pleural effusion. No pulmonary edema. Mild degenerative changes thoracic spine.  IMPRESSION: No active cardiopulmonary disease.   Electronically Signed   By: Lahoma Crocker M.D.   On: 09/21/2013 09:30   Dg Shoulder Left  09/21/2013   CLINICAL DATA:  Extremity pain  EXAM: LEFT SHOULDER - 2+ VIEW  COMPARISON:  04/13/2010  FINDINGS: Two views of left shoulder submitted. No acute fracture or subluxation. Mild degenerative changes left AC joint. Glenohumeral joint is preserved.  IMPRESSION: No acute fracture or subluxation. Mild degenerative changes left AC joint.   Electronically Signed   By: Lahoma Crocker M.D.   On: 09/21/2013 09:30   Personally viewed.  EKG:  No ST segment  changes. Sinus rhythm. T wave abnormality lateral leads. No change. Personally viewed.   Cardiac catheterization 3/20/ 2006  - Stent of proximal and mid circumflex with DES (treating in-stent restenosis), PTCA of mid circumflex through stent struts.  - LIMA to LAD was widely patent. SVG to first and second diagonal branches were patent and SVG to left ventricular branch was patent.  ASSESSMENT/PLAN:    80 year old male with coronary artery disease status post bypass, subsequent stenting, history of stroke, hypertension, hyperlipidemia with left shoulder/deltoid aching. Unremarkable workup thus far.  1. Left shoulder/upper arm pain- he clearly states that this is different from his previous angina and this most likely represents musculoskeletal discomfort. EKG is unchanged. Troponin normal. He walks daily on the treadmill and has not noted any anginal like symptoms. He does have left shoulder arthritis noted on x-ray. I discussed with he and his wife that this most likely represents musculoskeletal discomfort. Hopefully this will resolve over the next few days. If he notes any worsening anginal symptoms, typical or atypical, he knows to contact our office. His wife sees Dr. Tamala Julian as well. I'm comfortable with him being discharged from the emergency room. I have discussed with Dr. Thurnell Garbe.  No rashes noted. Watch for any atypical onset of zoster.  2. Coronary artery disease-status post bypass surgery-cardiac catheterization reviewed as above. Prior circumflex stents. Plavix. Secondary prevention.  3. History of stroke-Plavix. Statin. Exercise. No residual symptoms. Doing well.    Candee Furbish, MD  09/21/2013  11:35 AM

## 2013-09-21 NOTE — ED Provider Notes (Signed)
Medical screening examination/treatment/procedure(s) were conducted as a shared visit with non-physician practitioner(s) and myself.  I personally evaluated the patient during the encounter. 80yo M, c/o left upper arm "pain" that began this morning approximately 0710. Pt states he was walking around the house, performing his usual morning routine before his symptoms began. Pt states he sat down in a chair when his symptoms began. Pt describes the arm pain as "throbbing" and "aching;" was associated with diaphoresis. Pt took SL ntg x1 with improvement in his symptoms. Pt took ASA after the ntg. Pt continues without symptoms while in the ED. Workup reassuring and symptoms atypical, though concerned regarding pt's significant cardiac hx. 1030:  T/C to Cards PA Barrett, case discussed, including:  HPI, pertinent PM/SHx, VS/PE, dx testing, ED course and treatment:  Agreeable to come to ED for evaluation. 1155:  Cards Dr. Marlou Porch has evaluated pt and written a consult: agrees symptoms are atypical for ACS, likely is msk pain at this time, tx symptomatically, have pt f/u with his Cards Dr. Tamala Julian.  Dx and testing d/w pt and family.  Questions answered.  Verb understanding, agreeable to d/c home with outpt f/u.      EKG Interpretation   Date/Time:  Tuesday September 21 2013 08:12:15 EDT Ventricular Rate:  61 PR Interval:  136 QRS Duration: 88 QT Interval:  405 QTC Calculation: 408 R Axis:   55 Text Interpretation:  Sinus rhythm T wave abnormality Lateral leads When  compared with ECG of 05/26/2013 No significant change was found Confirmed  by [facility]  MD, Keny  ([id number]) on 09/21/2013 8:52:57 AM        Francine Graven, DO 09/23/13 1624

## 2013-09-21 NOTE — ED Notes (Signed)
Pt alert and oriented at discharge.  Pt offered a wheelchair but denied.  Pt verbalized understanding of discharge instructions and the need for follow up care.  Family at bedside took pt home.

## 2013-09-21 NOTE — Discharge Instructions (Signed)
°Emergency Department Resource Guide °1) Find a Doctor and Pay Out of Pocket °Although you won't have to find out who is covered by your insurance plan, it is a good idea to ask around and get recommendations. You will then need to call the office and see if the doctor you have chosen will accept you as a new patient and what types of options they offer for patients who are self-pay. Some doctors offer discounts or will set up payment plans for their patients who do not have insurance, but you will need to ask so you aren't surprised when you get to your appointment. ° °2) Contact Your Local Health Department °Not all health departments have doctors that can see patients for sick visits, but many do, so it is worth a call to see if yours does. If you don't know where your local health department is, you can check in your phone book. The CDC also has a tool to help you locate your state's health department, and many state websites also have listings of all of their local health departments. ° °3) Find a Walk-in Clinic °If your illness is not likely to be very severe or complicated, you may want to try a walk in clinic. These are popping up all over the country in pharmacies, drugstores, and shopping centers. They're usually staffed by nurse practitioners or physician assistants that have been trained to treat common illnesses and complaints. They're usually fairly quick and inexpensive. However, if you have serious medical issues or chronic medical problems, these are probably not your best option. ° °No Primary Care Doctor: °- Call [facility] Connect at  832-8000 - they can help you locate a primary care doctor that  accepts your insurance, provides certain services, etc. °- Physician Referral Service- 1-800-533-3463 ° °Chronic Pain Problems: °Organization         Address  Phone   Notes  °[facility]  (336) 297-2271 Patients need to be referred by their primary care doctor.  ° °Medication  Assistance: °Organization         Address  Phone   Notes  °[place] [facility] [address] °[place], [place] [address] (336) 641-8030 --Must be a resident of [place] °-- Must have NO insurance coverage whatsoever (no Medicaid/ Medicare, etc.) °-- The pt. MUST have a primary care doctor that directs their care regularly and follows them in the community °  °[facility]  (866) 331-1348   °[employer]  (888) 892-1162   ° °Agencies that provide inexpensive medical care: °Organization         Address  Phone   Notes  °[facility]  (336) 832-8035   °[facility]    (336) 832-7272   °[facility] [address] °[place], [place] [address] (336) 832-4777   °[facility] [address], °[place] (336) 271-4999   °[facility]    (336) 373-0678   °[facility]    (336) 272-1050   °[facility] ° [address], [place] Phone:  (336) 832-4444, Fax:  (336) 832-4440 Hours of Operation:  9 am - 6 pm, M-F.  Also accepts Medicaid/Medicare and self-pay.  °[facility] ° [address], [place] Phone: (336) 832-3150, Fax: (336) [phone]. Hours of Operation:  8:30 am - 5:30 pm, M-F.  Also accepts Medicaid and self-pay.  °[facility] [place] [address], [place] Phone: (336) 878-6027   °[facility] [address], [place], [place] (336)723-1848, Ext. 123 Mondays & Thursdays: 7-9 AM.  First 15 patients are seen on a first come, first serve basis. °  ° °Medicaid-accepting [place] Providers: ° °Organization         Address  Phone   Notes  °[facility] [address], [place] (336) 641-2100 Also accepts self-pay patients.  °[facility] [address], [place] ° (336) 856-9996   °[facility] [address], [place]  (336) 288-8857   °[facility] [address] [place] [address], [place] (336) 299-7000   °[facility] [address], [place]  ° (336) 373-1557 Only accepts [place] Access Medicaid patients after they have their name applied to their card.  ° °Self-Pay (no insurance) in [place]: ° °Organization         Address  Phone   Notes  °Sickle Cell Patients, [facility] [address], [place] (336) 832-1970   °[facility] [address], [place] (336) 832-4400   °[facility] ° [address], [place] (336) 992-4800   °[facility]/Dr. Osei-Bonsu ° [address] [place] [address], [place] or [address], [place] (336) [phone] Phone number for both [place] and [place] locations is the same.  °[facility] [address], [place] (336) 299-0000   °[facility] [address] [place] [address], [place] or [address] (336) 852-7530 °(336) 878-2260   °[facility] [address], [place] (336) 350-1642, phone; (336) 294-5005, fax Sees patients 1st and 3rd Saturday of every month.  Must not qualify for public or private insurance (i.e. Medicaid, Medicare, [place] Health Choice, Veterans' Benefits) • Household income should be no more than 200% of the poverty level •The clinic cannot treat you if you are pregnant or think you are pregnant • Sexually transmitted diseases are not treated at the clinic.  ° ° °Dental Care: °Organization         Address  Phone  Notes  °[place] [facility] [address],  (336) 641-6152 Accepts children up to age 21 who are enrolled in Medicaid or [place] Health Choice; pregnant women with a Medicaid card; and children who have applied for Medicaid or [place] Health Choice, but were declined, whose parents can pay a reduced fee at time of service.  °[place]  [facility] [place]  [address], [place] (336) 641-7733 Accepts children up to age 21 who are enrolled in Medicaid or [place] Health Choice; pregnant women with a Medicaid card; and children who have applied for Medicaid or [place] Health Choice, but were declined, whose parents can pay a reduced fee at time of service.  °[facility] ° [address],  (336) 641-4533 Patients are seen by appointment only. Walk-ins are not accepted. [facility] will see patients 18 years of age and older. °Monday - Tuesday (8am-5pm) °Most Wednesdays (8:30-5pm) °$30 per visit, cash only  °[facility] ° [address], [place] (336) 641-4533 Patients are seen by appointment only. Walk-ins are not accepted. [facility] will see patients 18 years of age and older. °One   Wednesday Evening (Monthly: Volunteer Based).  $30 per visit, cash only  °[facility]  (919) 537-3737 for adults; Children under age 4, call Graduate Pediatric Dentistry at (919) [phone]. Children aged 4-14, please call (919) [phone] to request a pediatric application. ° Dental services are provided in all areas of dental care including fillings, crowns and bridges, complete and partial dentures, implants, gum treatment, root canals, and extractions. Preventive care is also provided. Treatment is provided to both adults and children. °Patients are selected via a lottery and there is often a waiting list. °  °[facility] [address], °[place] ° (336) 763-8833 [email or web address].[email or web address] °  °[facility] [address], [place], [place] (336)723-1848, Ext. 123 Second and Fourth Thursday of each month, opens at 6:30 AM; Clinic ends at 9 AM.  Patients are seen on a first-come first-served basis, and a limited number are seen during each clinic.  ° °[facility] ° [address], [place], [place] (336) 723-7904    Eligibility Requirements °You must have lived in [place], [place], or [place] counties for at least the last three months. °  You cannot be eligible for state or federal sponsored healthcare insurance, including Veterans Administration, Medicaid, or Medicare. °  You generally cannot be eligible for healthcare insurance through your employer.  °  How to apply: °Eligibility screenings are held every Tuesday and Wednesday afternoon from 1:00 pm until 4:00 pm. You do not need an appointment for the interview!  °[facility] [address], [place], [place] 336-631-2330   °[facility]  336-342-8273   °[facility]  336-703-3100   °[facility]  336-570-6415   ° °Behavioral Health Resources in the Community: °Intensive Outpatient Programs °Organization         Address  Phone  Notes  °[facility] [address], [place], [place] 336-878-6098   °[facility] [address], [place], [place] 336-832-9800   °[facility] [address], [place], [place] ° 336-882-2125   °[facility] [address],  °[place], [place] 1-800-853-5163 or 336-641-4981   °Substance Abuse Resources °Organization         Address  Phone  Notes  °[facility]  336-882-2125   °[facility]  336-784-9470   °[facility]  336-285-9073   °[facility]  336-845-3988   °Residential & Outpatient Substance Abuse Program  1-800-659-3381   °Psychological Services °Organization         Address  Phone  Notes  °[facility]  336- 832-9600   °[facility]  336- 378-7881   °[facility] [address], [place] 1-800-853-5163 or 336-641-4981   ° °Mobile Crisis Teams °Organization         Address  Phone  Notes  °[facility], Mobile Crisis Care Unit  1-877-626-1772   °Assertive °[facility] ° [address]  [place], [place] 336-834-9664   °[facility] [address] °[place] 336-554-5454   ° °Self-Help/Support Groups °Organization         Address  Phone             Notes  °[facility]  - variety of support groups  336- 373-1402 Call for more information  °Narcotics Anonymous (NA), [facility] [address], °[place]  2 meetings at this location  ° °  Residential Treatment Programs Organization         Address  Phone  Notes  [facility] [address],    [place]  1-813-131-7314   [facility]  [address], [place] [address], [place], [place]   [place], [place] 819-350-4958 Admissions: 8am-3pm M-F  [facility] [address]    [place], [place] 914-782-9562   [facility] [address] [place], [place], [place]   [facility] [address]  [place], [place]   [facility] Intensive Outpatient [address], [place], [place]   [facility] ([place] [address]  [place], [place] 1-463-395-7191 or 417-888-4443   [facility] [address], [place], [place] Accepts Medicaid  [facility] [address],  [place] 1-484 569 1653 Substance Abuse/Addiction Treatment   [facility]         Address  Phone  Notes  [facility]  5853351612   Domenic Schwab, PhD [address] [place], [place]   (281)571-6540 or [phone]   [place] [address] [place], [place] 380-109-4767   [facility] [address] [place], [place] (224) [phone] Insurance/Medicaid/sponsorship through [facility] [address] [place]                                    [place], [place] [phone] [place] [address] [place], [place] [phone]    Dr. Adele Schilder  ([phone]   [facility] Dept. 1) [address] [place] 2) [place] 3)  [place] [address], [place] [phone]  [phone]   [place] (986)060-0332 or [phone] (After Hours)       Take your usual prescriptions as previously directed. Take over the counter tylenol, as directed on packaging, as needed for discomfort. Apply moist heat or ice to the area(s) of discomfort, for 15 minutes at a time, several times per day for the next few days.  Do not fall asleep on a heating or ice pack.  Call your regular Cardiologist and your regular medical doctor today to schedule a follow up appointment this week.  Return to the Emergency Department immediately if worsening.

## 2013-09-21 NOTE — ED Notes (Signed)
Onset today left shoulder pain radiating to left arm 5/10 achy stated broke out in a sweat.  Per EMS skin dry with pain improved to 1/10 achy. CBG 104 EKG - NSR.

## 2013-09-21 NOTE — ED Notes (Signed)
Pt coming from home with sudden onset of a throbbing, aching pain in the left arm.  Pt states the pain started below the shoulder and extended to the middle part of the forarm.  Pt has a hx of bi-pass surgery, 2 stents, stroke, and confusion).  Pt was reading when the arm pain occurred.  Pt family at bedside said the pt was clammy and appeared diaphoretic with the onset of the arm pain.  Pt took 324mg  Aspirin and 1 Nitro prior to EMS arrival.

## 2013-09-23 NOTE — ED Provider Notes (Signed)
Medical screening examination/treatment/procedure(s) were conducted as a shared visit with non-physician practitioner(s) and myself.  I personally evaluated the patient during the encounter. Please see my previous note.    EKG Interpretation   Date/Time:  Tuesday September 21 2013 08:12:15 EDT Ventricular Rate:  61 PR Interval:  136 QRS Duration: 88 QT Interval:  405 QTC Calculation: 408 R Axis:   55 Text Interpretation:  Sinus rhythm T wave abnormality Lateral leads When  compared with ECG of 05/26/2013 No significant change was found Confirmed  by [facility]  MD, Pilot Prindle ([id number]) on 09/21/2013 8:52:57 AM        Francine Graven, DO 09/23/13 1624

## 2013-09-27 DIAGNOSIS — H01009 Unspecified blepharitis unspecified eye, unspecified eyelid: Secondary | ICD-10-CM | POA: Diagnosis not present

## 2013-10-01 DIAGNOSIS — M25519 Pain in unspecified shoulder: Secondary | ICD-10-CM | POA: Diagnosis not present

## 2013-10-01 DIAGNOSIS — I10 Essential (primary) hypertension: Secondary | ICD-10-CM | POA: Diagnosis not present

## 2013-10-15 DIAGNOSIS — K219 Gastro-esophageal reflux disease without esophagitis: Secondary | ICD-10-CM | POA: Diagnosis not present

## 2013-10-15 DIAGNOSIS — R195 Other fecal abnormalities: Secondary | ICD-10-CM | POA: Diagnosis not present

## 2013-10-19 DIAGNOSIS — R197 Diarrhea, unspecified: Secondary | ICD-10-CM | POA: Diagnosis not present

## 2013-10-20 ENCOUNTER — Encounter: Payer: Self-pay | Admitting: Interventional Cardiology

## 2013-10-20 ENCOUNTER — Ambulatory Visit (INDEPENDENT_AMBULATORY_CARE_PROVIDER_SITE_OTHER): Payer: Medicare Other | Admitting: Interventional Cardiology

## 2013-10-20 VITALS — BP 148/80 | HR 62 | Ht 66.0 in | Wt 169.0 lb

## 2013-10-20 DIAGNOSIS — I10 Essential (primary) hypertension: Secondary | ICD-10-CM | POA: Diagnosis not present

## 2013-10-20 DIAGNOSIS — R195 Other fecal abnormalities: Secondary | ICD-10-CM | POA: Diagnosis not present

## 2013-10-20 DIAGNOSIS — I25810 Atherosclerosis of coronary artery bypass graft(s) without angina pectoris: Secondary | ICD-10-CM | POA: Diagnosis not present

## 2013-10-20 DIAGNOSIS — I2510 Atherosclerotic heart disease of native coronary artery without angina pectoris: Secondary | ICD-10-CM | POA: Diagnosis not present

## 2013-10-20 DIAGNOSIS — E785 Hyperlipidemia, unspecified: Secondary | ICD-10-CM | POA: Diagnosis not present

## 2013-10-20 NOTE — Patient Instructions (Signed)
Your physician recommends that you continue on your current medications as directed. Please refer to the Current Medication list given to you today.   Your physician recommends that you schedule a follow-up appointment as needed  

## 2013-10-20 NOTE — Progress Notes (Signed)
Patient ID: Delonta Yohannes, male   DOB: [birth year], 79 y.o.   MRN: [id number]    [address], [place]  [address] Phone: [phone] Fax:  [phone]  Date:  10/20/2013   ID:  Laural Golden, DOB 09-13-1928, MRN [id number]  PCP:  Mathews Argyle, MD   ASSESSMENT:  1. History of coronary artery disease with prior bypass surgery and most recent procedure of circumflex stent 2006. Recent episode of left arm discomfort not felt to be ischemic in early August and has not recurred. The patient has been physically active without limitations. 2. Hypertension is under excellent control 3. Hyperlipidemia being managed by primary care  PLAN:  1. Continue clinical observation. Return if chest or ischemic symptoms develop. 2. No particular functional testing or other cardiac measures seem indicated at this time.   SUBJECTIVE: Kolt Mcwhirter is a 80 y.o. male who is doing well. I have not seen him in several years. Dr. Marlou Porch saw him in the emergency room having localized left arm discomfort. It lasted approximately one hour. Markers were unremarkable as were EKGs. This episode occurred approximately 3 weeks ago. Since that time he has been fully active and had no recurrence of discomfort or change in his exertional capacity. In retrospect he now believes the discomfort was musculoskeletal or neurogenic. He has not needed to use nitroglycerin. He overall feels that he is doing well from cardiac standpoint. He is somewhat resistant to the idea of doing any further cardiac evaluation since he feels so well. He is accompanied by his wife.   Wt Readings from Last 3 Encounters:  10/20/13 169 lb (76.658 kg)  01/27/13 168 lb (76.204 kg)  12/20/12 169 lb 6.4 oz (76.839 kg)     Past Medical History  Diagnosis Date  . Hypertension   . Hypercholesteremia   . Heart murmur   . Benign prostatic hypertrophy   . GERD (gastroesophageal reflux disease)   . History of melanoma excision      FACE  . Perianal fistula   . S/P CABG x 4     1988  . S/P drug eluting coronary stent placement     POST CABG--  STENTING 2000  AND RESTENTING 2006 FOR REINSTENT STENOSIS  . Coronary artery disease CARDIOLOGIST -- DR Daneen Schick  . Allergic rhinitis   . H/O hiatal hernia   . Mild mitral regurgitation   . Left carotid stenosis     > 50%  PER DUPLEX  03-28-2011  . Stroke     Current Outpatient Prescriptions  Medication Sig Dispense Refill  . atenolol (TENORMIN) 25 MG tablet Take 25 mg by mouth daily after breakfast.      . atorvastatin (LIPITOR) 20 MG tablet Take 20 mg by mouth at bedtime.      . clopidogrel (PLAVIX) 75 MG tablet Take 75 mg by mouth daily.      Marland Kitchen docusate sodium (COLACE) 100 MG capsule Take 200 mg by mouth 2 (two) times daily.       . nitroGLYCERIN (NITROSTAT) 0.4 MG SL tablet Place 0.4 mg under the tongue every 5 (five) minutes as needed for chest pain.       Marland Kitchen oxymetazoline (AFRIN) 0.05 % nasal spray Place 1 spray into both nostrils daily.      . polyethylene glycol (MIRALAX / GLYCOLAX) packet Take 17 g by mouth daily.      . ranitidine (ZANTAC) 75 MG tablet Take 75 mg by mouth 2 (two) times  daily.       No current facility-administered medications for this visit.    Allergies:    Allergies  Allergen Reactions  . Iodine Rash    Rash when applied to skin  . Niacin And Related Hives and Other (See Comments)    FLUSHING  . Adhesive [Tape] Rash    Social History:  The patient  reports that he has never smoked. He has never used smokeless tobacco. He reports that he does not drink alcohol or use illicit drugs.   ROS:  Please see the history of present illness.   No episodes of syncope or transient neurological abnormality   All other systems reviewed and negative.   OBJECTIVE: VS:  BP 148/80  Pulse 62  Ht 5\' 6"  (1.676 m)  Wt 169 lb (76.658 kg)  BMI 27.29 kg/m2 Well nourished, well developed, in no acute distress, elderly HEENT: normal Neck: JVD flat.  Carotid bruit without bruits  Cardiac:  normal S1, S2; RRR; no murmur Lungs:  clear to auscultation bilaterally, no wheezing, rhonchi or rales Abd: soft, nontender, no hepatomegaly Ext: Edema absent. Pulses 2+ Skin: warm and dry Neuro:  CNs 2-12 intact, no focal abnormalities noted  EKG:  Not repeated       Signed, Illene Labrador III, MD 10/20/2013 1:05 PM

## 2013-10-26 ENCOUNTER — Encounter: Payer: Self-pay | Admitting: *Deleted

## 2013-10-27 ENCOUNTER — Ambulatory Visit (INDEPENDENT_AMBULATORY_CARE_PROVIDER_SITE_OTHER): Payer: Medicare Other | Admitting: Internal Medicine

## 2013-10-27 ENCOUNTER — Encounter: Payer: Self-pay | Admitting: Internal Medicine

## 2013-10-27 VITALS — BP 140/76 | HR 68 | Ht 67.5 in | Wt 169.0 lb

## 2013-10-27 DIAGNOSIS — R195 Other fecal abnormalities: Secondary | ICD-10-CM

## 2013-10-27 DIAGNOSIS — I2510 Atherosclerotic heart disease of native coronary artery without angina pectoris: Secondary | ICD-10-CM

## 2013-10-27 DIAGNOSIS — K219 Gastro-esophageal reflux disease without esophagitis: Secondary | ICD-10-CM

## 2013-10-27 NOTE — Progress Notes (Signed)
Patient ID: Jason Velazquez, male   DOB: December 09, 1928, 80 y.o.   MRN: [id number] HPI: Jason Velazquez is an 80 yo male with PMH of remote colon polyp, GERD, CAD s/p PCI on Plavix, HTN, HL, BPH, history of melanoma s/p excision, PVD, and TIA who is seen to evaluate subacute diarrhea.  He is here today with his wife.  He reports he was in his usual state of health until approximately 3 weeks ago when he developed loose and urgent stools. This was occurring post prandially and approximately 3-4 times daily. Stool described as loose and nonbloody. There was no associated abdominal pain but some mild lower abdominal pressure before bowel movement and alleviated by defecation. No sick contacts. No fevers or chills. No nausea or vomiting. Appetite has been good. No new medicines. He did stop Prilosec on advice from his primary care provider. He had been on this medicine for approximately 10 years. He has been on ranitidine 75 mg twice daily since stopping omeprazole. Ranitidine is working but it is not as effective as Prilosec. He did try probiotic for about 6 days. He denies increased gas and bloating. No weight loss. He reports in the last 2 days his bowel habits have returned to near normal. Yesterday he had 2 formed brown stools which were not loose or urgent. Today only one. He has a history of anal fissure repair by Dr. Marcello Moores approximately one year ago and also history of anal skin tag and internal hemorrhoid.  Past Medical History  Diagnosis Date  . Hypertension   . Hypercholesteremia   . Heart murmur   . Benign prostatic hypertrophy   . GERD (gastroesophageal reflux disease)   . History of melanoma excision     FACE  . Perianal fistula   . S/P CABG x 4     1988  . S/P drug eluting coronary stent placement     POST CABG--  STENTING 2000  AND RESTENTING 2006 FOR REINSTENT STENOSIS  . Coronary artery disease CARDIOLOGIST -- DR Daneen Schick  . Allergic rhinitis   . H/O hiatal hernia 2003  . Mild mitral  regurgitation   . Left carotid stenosis     > 50%  PER DUPLEX  03-28-2011  . Stroke   . Hepatic lesion 2004  . Duodenal diverticulum 2003    Past Surgical History  Procedure Laterality Date  . Transurethral resection of prostate  04/09/2011    Procedure: TRANSURETHRAL RESECTION OF THE PROSTATE WITH GYRUS INSTRUMENTS;  Surgeon: Malka So, MD;  Location: [facility];  Service: Urology;  Laterality: N/A;  . Cataract extraction w/ intraocular lens  implant, bilateral    . Tonsillectomy and adenoidectomy  1944  . Shoulder arthroscopy with open rotator cuff repair Right 07-10-1999  . Coronary angioplasty with stent placement  2000    PCI AND STENTING CIRCUMFLEX  . Coronary angioplasty with stent placement  04-30-2004  DR Daneen Schick    RE-INSTENT STENOSIS/  DRUG-ELUTING STENT OF PROXIMAL AND MID CIRCUMFLEX/ PCI MID CIRCUMFLEX THROUGH STENT STRUT/  WIDELY PATENT SAPHENOUS VEIN GRAFT AND  LIMA TO LAD GRAFT/ TOTAL OCCLUSION RIGHT CORONARY BEYOND THE POSTERIOR DESCENDING ARTERY BRANCH WITH 50% OSTIAL NARROWING/ TOTAL OCCLUSION OF LAD AT THE OSTIUM OF THE LEFT MAIN  . Coronary artery bypass graft  1988    X5  . Transthoracic echocardiogram  03-01-2011  DR Daneen Schick    NORMAL LVF AND LV SIZE/ MILD LEFT ATRIAL ENLARGEMENT/ GRADE II DIASTOLIC DYSFUNCTION WITH ELEVATED LEFT ATRIAL PRESSURE/  MILD TO MODERATE MR/ MILD TR  . Evaluation under anesthesia with anal fistulectomy N/A 07/22/2012    Procedure: EXAM UNDER ANESTHESIA WITH ANAL fistulotomy;  Surgeon: Leighton Ruff, MD;  Location: [facility];  Service: General;  Laterality: N/A;    Outpatient Prescriptions Prior to Visit  Medication Sig Dispense Refill  . atenolol (TENORMIN) 25 MG tablet Take 25 mg by mouth daily after breakfast.      . atorvastatin (LIPITOR) 20 MG tablet Take 20 mg by mouth at bedtime.      . clopidogrel (PLAVIX) 75 MG tablet Take 75 mg by mouth daily.      Marland Kitchen docusate sodium (COLACE) 100 MG capsule Take 200 mg by  mouth 2 (two) times daily.       . nitroGLYCERIN (NITROSTAT) 0.4 MG SL tablet Place 0.4 mg under the tongue every 5 (five) minutes as needed for chest pain.       Marland Kitchen oxymetazoline (AFRIN) 0.05 % nasal spray Place 1 spray into both nostrils daily.      . polyethylene glycol (MIRALAX / GLYCOLAX) packet Take 17 g by mouth daily.      . ranitidine (ZANTAC) 75 MG tablet Take 75 mg by mouth 2 (two) times daily.       No facility-administered medications prior to visit.    Allergies  Allergen Reactions  . Iodine Rash    Rash when applied to skin  . Niacin And Related Hives and Other (See Comments)    FLUSHING  . Adhesive [Tape] Rash    Family History  Problem Relation Age of Onset  . Breast cancer Mother   . Colon cancer Brother     History  Substance Use Topics  . Smoking status: Never Smoker   . Smokeless tobacco: Never Used  . Alcohol Use: No    ROS: As per history of present illness, otherwise negative  BP 140/76  Pulse 68  Ht 5' 7.5" (1.715 m)  Wt 169 lb (76.658 kg)  BMI 26.06 kg/m2 Constitutional: Well-developed and well-nourished. No distress. HEENT: Normocephalic and atraumatic. Oropharynx is clear and moist. No oropharyngeal exudate. Conjunctivae are normal.  No scleral icterus. Neck: Neck supple. Trachea midline. Cardiovascular: Normal rate, regular rhythm and intact distal pulses.  Pulmonary/chest: Effort normal and breath sounds normal. No wheezing, rales or rhonchi. Abdominal: Soft, nontender, nondistended. Bowel sounds active throughout. There are no masses palpable.  Extremities: no clubbing, cyanosis, or edema Neurological: Alert and oriented to person place and time. Skin: Skin is warm and dry. No rashes noted. Psychiatric: Normal mood and affect. Behavior is normal.  RELEVANT LABS AND IMAGING: CBC    Component Value Date/Time   WBC 4.6 09/21/2013 0850   RBC 4.50 09/21/2013 0850   HGB 14.3 09/21/2013 0850   HCT 41.5 09/21/2013 0850   PLT 191 09/21/2013  0850   MCV 92.2 09/21/2013 0850   MCH 31.8 09/21/2013 0850   MCHC 34.5 09/21/2013 0850   RDW 12.6 09/21/2013 0850   LYMPHSABS 0.8 09/21/2013 0850   MONOABS 0.6 09/21/2013 0850   EOSABS 0.1 09/21/2013 0850   BASOSABS 0.0 09/21/2013 0850    CMP     Component Value Date/Time   NA 134* 09/21/2013 0850   K 4.8 09/21/2013 0850   CL 98 09/21/2013 0850   CO2 28 09/21/2013 0850   GLUCOSE 93 09/21/2013 0850   BUN 12 09/21/2013 0850   CREATININE 0.92 09/21/2013 0850   CALCIUM 8.9 09/21/2013 0850   PROT 6.3 09/21/2013  0850   ALBUMIN 3.5 09/21/2013 0850   AST 20 09/21/2013 0850   ALT 17 09/21/2013 0850   ALKPHOS 57 09/21/2013 0850   BILITOT 0.6 09/21/2013 0850   GFRNONAA 76* 09/21/2013 0850   GFRAA 88* 09/21/2013 0850   Colonoscopy - Dr. Earle Gell, March 2009, for polyp surveillance.  Normal colon.   c diff toxin, EIA - negative (10/20/2013)  ASSESSMENT/PLAN: 80 yo male with PMH of remote colon polyp, GERD, CAD s/p PCI on Plavix, HTN, HL, BPH, history of melanoma s/p excision, PVD, and TIA who is seen to evaluate subacute diarrhea.  He is here today with his wife.  He reports he was in his usual  1. Subacute diarrhea/loose stools -- now improved dramatically. I suspect this was an infectious etiology, viral versus bacterial which is now resolved. He certainly could have had an element of post infectious irritable bowel which is now improving. There are no alarm symptoms such as bleeding or abdominal pain. He had a colonoscopy which was normal in 2009. Given his overall improvement I will not perform additional testing at this time. I have recommended he resume daily probiotic for about one month. I do not expect omeprazole was the culprit for the loose stools and so I have given him permission to resume this medication if ranitidine continues to be not as effective.  2. GERD -- see above, patient can resume omeprazole if ranitidine less effective  3. CRC screening -- last colonoscopy 2009 was normal. Remote  history of small polyp. Recent data supports returning to a ten-year screening interval for patients with remote polyps and interval normal colonoscopy. This would apply to him. He will likely not require repeat colonoscopy for screening purposes. He understands and agrees with this recommendation.  I have asked that he notify me should his bowel habits failed to return completely to normal or should he develop further loose stools, diarrhea or other abdominal complaints

## 2013-10-27 NOTE — Patient Instructions (Signed)
Please purchase a probiotic over the counter (ex.Humphrey Rolls, Florastor) This puts good bacteria back into your colon. You should take 1 capsule by mouth once daily x at least 1 month.  CC:Dr Stoneking

## 2013-10-29 ENCOUNTER — Ambulatory Visit: Payer: Medicare Other | Admitting: Internal Medicine

## 2013-11-26 ENCOUNTER — Other Ambulatory Visit: Payer: Self-pay

## 2013-12-13 DIAGNOSIS — Z23 Encounter for immunization: Secondary | ICD-10-CM | POA: Diagnosis not present

## 2013-12-21 DIAGNOSIS — H9071 Mixed conductive and sensorineural hearing loss, unilateral, right ear, with unrestricted hearing on the contralateral side: Secondary | ICD-10-CM | POA: Diagnosis not present

## 2013-12-21 DIAGNOSIS — H9042 Sensorineural hearing loss, unilateral, left ear, with unrestricted hearing on the contralateral side: Secondary | ICD-10-CM | POA: Diagnosis not present

## 2013-12-23 DIAGNOSIS — L570 Actinic keratosis: Secondary | ICD-10-CM | POA: Diagnosis not present

## 2013-12-23 DIAGNOSIS — Z85820 Personal history of malignant melanoma of skin: Secondary | ICD-10-CM | POA: Diagnosis not present

## 2013-12-23 DIAGNOSIS — Z85828 Personal history of other malignant neoplasm of skin: Secondary | ICD-10-CM | POA: Diagnosis not present

## 2013-12-23 DIAGNOSIS — L821 Other seborrheic keratosis: Secondary | ICD-10-CM | POA: Diagnosis not present

## 2013-12-23 DIAGNOSIS — D3617 Benign neoplasm of peripheral nerves and autonomic nervous system of trunk, unspecified: Secondary | ICD-10-CM | POA: Diagnosis not present

## 2014-01-03 DIAGNOSIS — H52203 Unspecified astigmatism, bilateral: Secondary | ICD-10-CM | POA: Diagnosis not present

## 2014-01-03 DIAGNOSIS — Z961 Presence of intraocular lens: Secondary | ICD-10-CM | POA: Diagnosis not present

## 2014-03-08 DIAGNOSIS — M25511 Pain in right shoulder: Secondary | ICD-10-CM | POA: Diagnosis not present

## 2014-03-08 DIAGNOSIS — K219 Gastro-esophageal reflux disease without esophagitis: Secondary | ICD-10-CM | POA: Diagnosis not present

## 2014-03-08 DIAGNOSIS — I340 Nonrheumatic mitral (valve) insufficiency: Secondary | ICD-10-CM | POA: Diagnosis not present

## 2014-03-08 DIAGNOSIS — I10 Essential (primary) hypertension: Secondary | ICD-10-CM | POA: Diagnosis not present

## 2014-05-16 DIAGNOSIS — Z1389 Encounter for screening for other disorder: Secondary | ICD-10-CM | POA: Diagnosis not present

## 2014-05-16 DIAGNOSIS — E780 Pure hypercholesterolemia: Secondary | ICD-10-CM | POA: Diagnosis not present

## 2014-05-16 DIAGNOSIS — I2510 Atherosclerotic heart disease of native coronary artery without angina pectoris: Secondary | ICD-10-CM | POA: Diagnosis not present

## 2014-05-16 DIAGNOSIS — K219 Gastro-esophageal reflux disease without esophagitis: Secondary | ICD-10-CM | POA: Diagnosis not present

## 2014-05-16 DIAGNOSIS — I340 Nonrheumatic mitral (valve) insufficiency: Secondary | ICD-10-CM | POA: Diagnosis not present

## 2014-05-16 DIAGNOSIS — Z0000 Encounter for general adult medical examination without abnormal findings: Secondary | ICD-10-CM | POA: Diagnosis not present

## 2014-05-16 DIAGNOSIS — Z79899 Other long term (current) drug therapy: Secondary | ICD-10-CM | POA: Diagnosis not present

## 2014-05-16 DIAGNOSIS — E222 Syndrome of inappropriate secretion of antidiuretic hormone: Secondary | ICD-10-CM | POA: Diagnosis not present

## 2014-05-16 DIAGNOSIS — I10 Essential (primary) hypertension: Secondary | ICD-10-CM | POA: Diagnosis not present

## 2014-06-27 DIAGNOSIS — D1801 Hemangioma of skin and subcutaneous tissue: Secondary | ICD-10-CM | POA: Diagnosis not present

## 2014-06-27 DIAGNOSIS — Z85828 Personal history of other malignant neoplasm of skin: Secondary | ICD-10-CM | POA: Diagnosis not present

## 2014-06-27 DIAGNOSIS — D485 Neoplasm of uncertain behavior of skin: Secondary | ICD-10-CM | POA: Diagnosis not present

## 2014-06-27 DIAGNOSIS — L570 Actinic keratosis: Secondary | ICD-10-CM | POA: Diagnosis not present

## 2014-06-27 DIAGNOSIS — C4441 Basal cell carcinoma of skin of scalp and neck: Secondary | ICD-10-CM | POA: Diagnosis not present

## 2014-06-27 DIAGNOSIS — Z85820 Personal history of malignant melanoma of skin: Secondary | ICD-10-CM | POA: Diagnosis not present

## 2014-06-27 DIAGNOSIS — L821 Other seborrheic keratosis: Secondary | ICD-10-CM | POA: Diagnosis not present

## 2014-08-08 ENCOUNTER — Other Ambulatory Visit: Payer: Self-pay

## 2014-11-15 DIAGNOSIS — I10 Essential (primary) hypertension: Secondary | ICD-10-CM | POA: Diagnosis not present

## 2014-12-08 NOTE — Telephone Encounter (Signed)
Error

## 2014-12-27 DIAGNOSIS — Z85828 Personal history of other malignant neoplasm of skin: Secondary | ICD-10-CM | POA: Diagnosis not present

## 2014-12-27 DIAGNOSIS — D225 Melanocytic nevi of trunk: Secondary | ICD-10-CM | POA: Diagnosis not present

## 2014-12-27 DIAGNOSIS — Z85820 Personal history of malignant melanoma of skin: Secondary | ICD-10-CM | POA: Diagnosis not present

## 2014-12-27 DIAGNOSIS — D1801 Hemangioma of skin and subcutaneous tissue: Secondary | ICD-10-CM | POA: Diagnosis not present

## 2014-12-27 DIAGNOSIS — D3617 Benign neoplasm of peripheral nerves and autonomic nervous system of trunk, unspecified: Secondary | ICD-10-CM | POA: Diagnosis not present

## 2014-12-27 DIAGNOSIS — L570 Actinic keratosis: Secondary | ICD-10-CM | POA: Diagnosis not present

## 2014-12-27 DIAGNOSIS — Z23 Encounter for immunization: Secondary | ICD-10-CM | POA: Diagnosis not present

## 2014-12-27 DIAGNOSIS — L820 Inflamed seborrheic keratosis: Secondary | ICD-10-CM | POA: Diagnosis not present

## 2014-12-27 DIAGNOSIS — L821 Other seborrheic keratosis: Secondary | ICD-10-CM | POA: Diagnosis not present

## 2015-01-03 DIAGNOSIS — R269 Unspecified abnormalities of gait and mobility: Secondary | ICD-10-CM | POA: Diagnosis not present

## 2015-01-18 DIAGNOSIS — J309 Allergic rhinitis, unspecified: Secondary | ICD-10-CM | POA: Diagnosis not present

## 2015-01-18 DIAGNOSIS — I10 Essential (primary) hypertension: Secondary | ICD-10-CM | POA: Diagnosis not present

## 2015-01-26 DIAGNOSIS — R197 Diarrhea, unspecified: Secondary | ICD-10-CM | POA: Diagnosis not present

## 2015-01-26 DIAGNOSIS — J069 Acute upper respiratory infection, unspecified: Secondary | ICD-10-CM | POA: Diagnosis not present

## 2015-02-14 DIAGNOSIS — H52203 Unspecified astigmatism, bilateral: Secondary | ICD-10-CM | POA: Diagnosis not present

## 2015-02-14 DIAGNOSIS — Z961 Presence of intraocular lens: Secondary | ICD-10-CM | POA: Diagnosis not present

## 2015-04-10 DIAGNOSIS — R42 Dizziness and giddiness: Secondary | ICD-10-CM | POA: Diagnosis not present

## 2015-04-10 DIAGNOSIS — R21 Rash and other nonspecific skin eruption: Secondary | ICD-10-CM | POA: Diagnosis not present

## 2015-04-10 DIAGNOSIS — I10 Essential (primary) hypertension: Secondary | ICD-10-CM | POA: Diagnosis not present

## 2015-05-16 DIAGNOSIS — R413 Other amnesia: Secondary | ICD-10-CM | POA: Diagnosis not present

## 2015-05-16 DIAGNOSIS — I2510 Atherosclerotic heart disease of native coronary artery without angina pectoris: Secondary | ICD-10-CM | POA: Diagnosis not present

## 2015-05-16 DIAGNOSIS — I10 Essential (primary) hypertension: Secondary | ICD-10-CM | POA: Diagnosis not present

## 2015-05-16 DIAGNOSIS — E7800 Pure hypercholesterolemia, unspecified: Secondary | ICD-10-CM | POA: Diagnosis not present

## 2015-05-16 DIAGNOSIS — K5901 Slow transit constipation: Secondary | ICD-10-CM | POA: Diagnosis not present

## 2015-05-16 DIAGNOSIS — Z79899 Other long term (current) drug therapy: Secondary | ICD-10-CM | POA: Diagnosis not present

## 2015-05-23 DIAGNOSIS — S0101XA Laceration without foreign body of scalp, initial encounter: Secondary | ICD-10-CM | POA: Diagnosis not present

## 2015-05-23 DIAGNOSIS — S0990XA Unspecified injury of head, initial encounter: Secondary | ICD-10-CM | POA: Diagnosis not present

## 2015-06-19 ENCOUNTER — Other Ambulatory Visit: Payer: Self-pay | Admitting: Nurse Practitioner

## 2015-06-19 ENCOUNTER — Ambulatory Visit
Admission: RE | Admit: 2015-06-19 | Discharge: 2015-06-19 | Disposition: A | Discharge status: Home / Self Care | Payer: Medicare Other | Source: Ambulatory Visit | Attending: Nurse Practitioner | Admitting: Nurse Practitioner

## 2015-06-19 DIAGNOSIS — IMO0001 Reserved for inherently not codable concepts without codable children: Secondary | ICD-10-CM

## 2015-06-19 DIAGNOSIS — R030 Elevated blood-pressure reading, without diagnosis of hypertension: Secondary | ICD-10-CM

## 2015-06-19 DIAGNOSIS — R200 Anesthesia of skin: Secondary | ICD-10-CM | POA: Diagnosis not present

## 2015-06-19 DIAGNOSIS — R29898 Other symptoms and signs involving the musculoskeletal system: Secondary | ICD-10-CM

## 2015-06-19 DIAGNOSIS — I10 Essential (primary) hypertension: Secondary | ICD-10-CM | POA: Diagnosis not present

## 2015-06-19 DIAGNOSIS — G459 Transient cerebral ischemic attack, unspecified: Secondary | ICD-10-CM | POA: Diagnosis not present

## 2015-06-19 MED ORDER — GADOBENATE DIMEGLUMINE 529 MG/ML IV SOLN: 15.0000 mL | Freq: Once | INTRAVENOUS | Status: DC | PRN

## 2015-07-03 DIAGNOSIS — Z85828 Personal history of other malignant neoplasm of skin: Secondary | ICD-10-CM | POA: Diagnosis not present

## 2015-07-03 DIAGNOSIS — D3617 Benign neoplasm of peripheral nerves and autonomic nervous system of trunk, unspecified: Secondary | ICD-10-CM | POA: Diagnosis not present

## 2015-07-03 DIAGNOSIS — L570 Actinic keratosis: Secondary | ICD-10-CM | POA: Diagnosis not present

## 2015-07-03 DIAGNOSIS — D225 Melanocytic nevi of trunk: Secondary | ICD-10-CM | POA: Diagnosis not present

## 2015-07-03 DIAGNOSIS — D692 Other nonthrombocytopenic purpura: Secondary | ICD-10-CM | POA: Diagnosis not present

## 2015-07-03 DIAGNOSIS — D485 Neoplasm of uncertain behavior of skin: Secondary | ICD-10-CM | POA: Diagnosis not present

## 2015-07-03 DIAGNOSIS — D0422 Carcinoma in situ of skin of left ear and external auricular canal: Secondary | ICD-10-CM | POA: Diagnosis not present

## 2015-07-03 DIAGNOSIS — D1801 Hemangioma of skin and subcutaneous tissue: Secondary | ICD-10-CM | POA: Diagnosis not present

## 2015-07-14 ENCOUNTER — Ambulatory Visit: Payer: Medicare Other | Admitting: Podiatry

## 2015-07-17 DIAGNOSIS — I10 Essential (primary) hypertension: Secondary | ICD-10-CM | POA: Diagnosis not present

## 2015-07-17 DIAGNOSIS — E222 Syndrome of inappropriate secretion of antidiuretic hormone: Secondary | ICD-10-CM | POA: Diagnosis not present

## 2015-07-17 DIAGNOSIS — G459 Transient cerebral ischemic attack, unspecified: Secondary | ICD-10-CM | POA: Diagnosis not present

## 2015-07-19 ENCOUNTER — Encounter: Payer: Medicare Other | Admitting: Podiatry

## 2015-07-27 ENCOUNTER — Ambulatory Visit (INDEPENDENT_AMBULATORY_CARE_PROVIDER_SITE_OTHER): Payer: Medicare Other

## 2015-07-27 ENCOUNTER — Ambulatory Visit (INDEPENDENT_AMBULATORY_CARE_PROVIDER_SITE_OTHER): Payer: Medicare Other | Admitting: Podiatry

## 2015-07-27 ENCOUNTER — Encounter: Payer: Self-pay | Admitting: Podiatry

## 2015-07-27 DIAGNOSIS — M779 Enthesopathy, unspecified: Secondary | ICD-10-CM

## 2015-07-27 DIAGNOSIS — M79672 Pain in left foot: Secondary | ICD-10-CM

## 2015-07-27 DIAGNOSIS — R52 Pain, unspecified: Secondary | ICD-10-CM

## 2015-07-30 ENCOUNTER — Encounter: Payer: Self-pay | Admitting: Podiatry

## 2015-07-30 NOTE — Progress Notes (Signed)
Patient ID: Jason Velazquez, male   DOB: 1928-02-24, 82 y.o.   MRN: [id number]  Subjective: 82 year old male presents the office today for concerns of left foot pain which hurts on top of his foot which is been ongoing the last 2 days. He states that he will 2 days ago he started having pain to his foot. Denies any recent injury or trauma. Denies any change or increase in activity recently. Denies any redness or warmth to his foot. No history of gout. No recent treatment. Pain has gotten somewhat better. Denies any systemic complaints such as fevers, chills, nausea, vomiting. No acute changes since last appointment, and no other complaints at this time.   Objective: AAO x3, NAD DP/PT pulses palpable bilaterally, CRT less than 3 seconds Mild diffuse tenderness on the left dorsal midfoot. There is no specific area pinpoint bony tenderness is no pain vibratory sensation. There is trace edema along the dorsal aspect of the foot however there is no erythema or increase in warmth. Ankle, subtalar, midtarsal range of motion intact. There is no other areas of tenderness bilaterally. Decreased range of motion of the first MTPJ and the left foot.  No open lesions or pre-ulcerative lesions.  No pain with calf compression, swelling, warmth, erythema  Assessment: Left dorsal midfoot pain   Plan: -All treatment options discussed with the patient including all alternatives, risks, complications.  -X-rays were obtained and reviewed with the patient. No evidence of acute fracture. Arthritic changes present first MTPJ.  -Discussed steroid injection including risks, complications. He wishes to proceed with the injection. Under sterile conditions a mixture Dexon is an phosphate and local anesthetic was infiltrated the left dorsal midfoot over the area of maximal tenderness without complications. Post injection care was discussed. Ice and elevation. Ordered compound cream for anti-inflammatory. Also discuss orthotics and  shoe gear modifications.  -Follow-up as scheduled or sooner if needed.  -Patient encouraged to call the office with any questions, concerns, change in symptoms.   Celesta Gentile, DPM

## 2015-07-31 ENCOUNTER — Telehealth: Payer: Self-pay | Admitting: Podiatry

## 2015-07-31 ENCOUNTER — Telehealth: Payer: Self-pay | Admitting: *Deleted

## 2015-07-31 MED ORDER — NONFORMULARY OR COMPOUNDED ITEM: Status: DC

## 2015-07-31 NOTE — Telephone Encounter (Signed)
I informed pt the medication would be coming from a pharmacy in [place], and they would call him and deliver.  Pt states understanding.

## 2015-07-31 NOTE — Telephone Encounter (Signed)
Faxed rx to [facility]. 

## 2015-07-31 NOTE — Telephone Encounter (Signed)
Pt called stating Dr. Jacqualyn Posey was going to send in a RX but it is not at his pharmacy

## 2015-08-24 ENCOUNTER — Encounter: Payer: Self-pay | Admitting: Podiatry

## 2015-08-24 ENCOUNTER — Ambulatory Visit (INDEPENDENT_AMBULATORY_CARE_PROVIDER_SITE_OTHER): Payer: Medicare Other | Admitting: Podiatry

## 2015-08-24 DIAGNOSIS — M19072 Primary osteoarthritis, left ankle and foot: Secondary | ICD-10-CM

## 2015-08-24 DIAGNOSIS — M79672 Pain in left foot: Secondary | ICD-10-CM | POA: Diagnosis not present

## 2015-08-24 DIAGNOSIS — M779 Enthesopathy, unspecified: Secondary | ICD-10-CM

## 2015-08-27 NOTE — Progress Notes (Signed)
Patient ID: Jason Velazquez, male   DOB: 05-19-1928, 82 y.o.   MRN: [id number]  Subjective: 82 year old male presents the office today for follow valuation left dorsal midfoot pain. Since last appointment he has had complete resolution of pain he is going on expansion any symptoms. He has been doing his daily activities without any pain as well. Denies any systemic complaints such as fevers, chills, nausea, vomiting. No acute changes since last appointment, and no other complaints at this time.   Objective: AAO x3, NAD DP/PT pulses palpable bilaterally, CRT less than 3 seconds At this time there is no tenderness palpation along the dorsal left midfoot. There is no areas of edema, erythema, increase in warmth. No areas of tenderness bilaterally. Marland Kitchen MMT 5/5, ROM WNL. No edema, erythema, increase in warmth to bilateral lower extremities.  No open lesions or pre-ulcerative lesions.  No pain with calf compression, swelling, warmth, erythema  Assessment: Resolved left foot pain; osteoarthritis  Plan: -All treatment options discussed with the patient including all alternatives, risks, complications.  -I discussed with him ways to help prevent the pain from reoccurring. Recommend continue with orthotics as well as supportive shoes. If he starts to have any reoccurrence of symptoms to call the office otherwise follow-up as needed at this point. -Patient encouraged to call the office with any questions, concerns, change in symptoms.   Celesta Gentile, DPM

## 2015-08-31 DIAGNOSIS — S96911A Strain of unspecified muscle and tendon at ankle and foot level, right foot, initial encounter: Secondary | ICD-10-CM | POA: Diagnosis not present

## 2015-09-05 ENCOUNTER — Encounter: Payer: Self-pay | Admitting: Podiatry

## 2015-09-05 ENCOUNTER — Ambulatory Visit (INDEPENDENT_AMBULATORY_CARE_PROVIDER_SITE_OTHER): Payer: Medicare Other | Admitting: Podiatry

## 2015-09-05 ENCOUNTER — Ambulatory Visit (INDEPENDENT_AMBULATORY_CARE_PROVIDER_SITE_OTHER): Payer: Medicare Other

## 2015-09-05 DIAGNOSIS — R52 Pain, unspecified: Secondary | ICD-10-CM | POA: Diagnosis not present

## 2015-09-05 DIAGNOSIS — M779 Enthesopathy, unspecified: Secondary | ICD-10-CM | POA: Diagnosis not present

## 2015-09-06 NOTE — Progress Notes (Signed)
Subjective: 82 year old male presents the office today for concerns of right foot pain which started about 1 week ago. He was in the pool doing water aerobics jumping up and down and since then he has had some pain to the foot. He has been putting ice on occasion is somewhat better but does continue. He states the swelling has decreased. No redness at this time. Denies any systemic complaints such as fevers, chills, nausea, vomiting. No acute changes since last appointment, and no other complaints at this time.   Objective: AAO x3, NAD DP/PT pulses palpable bilaterally, CRT less than 3 seconds There is tenderness on the course of the peroneal tendon posterior to the lateral malleolus and just distal to this area as well. Tendons appear to be intact. There is no open edema, erythema, increase in warmth. There is no area pinpoint bony tenderness or pain the vibratory sensation and ankle or foot. No areas of pinpoint bony tenderness or pain with vibratory sensation. MMT 5/5, ROM WNL. No edema, erythema, increase in warmth to bilateral lower extremities.  No open lesions or pre-ulcerative lesions.  No pain with calf compression, swelling, warmth, erythema  Assessment: Right peroneal tendinitis  Plan: -All treatment options discussed with the patient including all alternatives, risks, complications.  -X-rays were obtained and reviewed with the patient. No evidence of acute fracture or stress fracture -I discussed steroid injection to the area of maximal tenderness he wishes to proceed and understand risks and complications. Under sterile conditions a mixture Dexon is an phosphate and local anesthetics infiltrated without complications. Post injection care discussed -Dispensed ankle brace -Ice to the area -Hold off on exercise -Follow up as scheduled or sooner if needed -Patient encouraged to call the offiie with any questions, concerns, change in symptoms.   Celesta Gentile, DPM

## 2015-09-19 DIAGNOSIS — Z1389 Encounter for screening for other disorder: Secondary | ICD-10-CM | POA: Diagnosis not present

## 2015-09-19 DIAGNOSIS — Z6829 Body mass index (BMI) 29.0-29.9, adult: Secondary | ICD-10-CM | POA: Diagnosis not present

## 2015-09-19 DIAGNOSIS — I10 Essential (primary) hypertension: Secondary | ICD-10-CM | POA: Diagnosis not present

## 2015-09-19 DIAGNOSIS — E7800 Pure hypercholesterolemia, unspecified: Secondary | ICD-10-CM | POA: Diagnosis not present

## 2015-09-19 DIAGNOSIS — R413 Other amnesia: Secondary | ICD-10-CM | POA: Diagnosis not present

## 2015-09-19 DIAGNOSIS — Z0000 Encounter for general adult medical examination without abnormal findings: Secondary | ICD-10-CM | POA: Diagnosis not present

## 2015-09-19 DIAGNOSIS — Z23 Encounter for immunization: Secondary | ICD-10-CM | POA: Diagnosis not present

## 2015-09-19 DIAGNOSIS — Z79899 Other long term (current) drug therapy: Secondary | ICD-10-CM | POA: Diagnosis not present

## 2015-09-19 DIAGNOSIS — E222 Syndrome of inappropriate secretion of antidiuretic hormone: Secondary | ICD-10-CM | POA: Diagnosis not present

## 2015-09-26 ENCOUNTER — Encounter: Payer: Self-pay | Admitting: Podiatry

## 2015-09-26 ENCOUNTER — Ambulatory Visit (INDEPENDENT_AMBULATORY_CARE_PROVIDER_SITE_OTHER): Payer: Medicare Other | Admitting: Podiatry

## 2015-09-26 DIAGNOSIS — M779 Enthesopathy, unspecified: Secondary | ICD-10-CM

## 2015-10-01 NOTE — Progress Notes (Signed)
Subjective: 82 year old male presents the office today for follow up evaluation of right foot pain. He states this is possibly needs a complete resolution of pain he has not had any. He has not been wearing a brace. He has had no swelling or redness. He has no pain with ambulation. No other complaints at this time.   Objective: AAO x3, NAD DP/PT pulses palpable bilaterally, CRT less than 3 seconds There is no tenderness on the course of the peroneal tendons.Tendons appear to be intact. There is no open edema, erythema, increase in warmth. There is no area pinpoint bony tenderness or pain the vibratory sensation and ankle or foot. No areas of pinpoint bony tenderness or pain with vibratory sensation. MMT 5/5, ROM WNL. No edema, erythema, increase in warmth to bilateral lower extremities.  No open lesions or pre-ulcerative lesions.  No pain with calf compression, swelling, warmth, erythema  Assessment: Right peroneal tendinitis which has resolved.   Plan: -All treatment options discussed with the patient including all alternatives, risks, complications.  -At this time his pain is completely resolved. I discussed with them rehabilitation exercises to help prevent recurrence as well as shoe gear modifications and orthotics. At any time he is any recurrent symptoms to call the office. Follow-up as needed.  Celesta Gentile, DPM

## 2015-10-12 NOTE — Progress Notes (Signed)
This encounter was created in error - please disregard.

## 2015-10-31 ENCOUNTER — Ambulatory Visit (INDEPENDENT_AMBULATORY_CARE_PROVIDER_SITE_OTHER): Payer: Medicare Other

## 2015-10-31 ENCOUNTER — Encounter: Payer: Self-pay | Admitting: Podiatry

## 2015-10-31 ENCOUNTER — Ambulatory Visit (INDEPENDENT_AMBULATORY_CARE_PROVIDER_SITE_OTHER): Payer: Medicare Other | Admitting: Podiatry

## 2015-10-31 DIAGNOSIS — M779 Enthesopathy, unspecified: Secondary | ICD-10-CM

## 2015-10-31 DIAGNOSIS — S9032XA Contusion of left foot, initial encounter: Secondary | ICD-10-CM | POA: Diagnosis not present

## 2015-10-31 DIAGNOSIS — R600 Localized edema: Secondary | ICD-10-CM

## 2015-10-31 DIAGNOSIS — R609 Edema, unspecified: Secondary | ICD-10-CM

## 2015-10-31 DIAGNOSIS — M778 Other enthesopathies, not elsewhere classified: Secondary | ICD-10-CM

## 2015-10-31 DIAGNOSIS — M79672 Pain in left foot: Secondary | ICD-10-CM | POA: Diagnosis not present

## 2015-10-31 DIAGNOSIS — M7752 Other enthesopathy of left foot: Secondary | ICD-10-CM

## 2015-10-31 MED ORDER — BETAMETHASONE SOD PHOS & ACET 6 (3-3) MG/ML IJ SUSP: 12.0000 mg | Freq: Once | INTRAMUSCULAR | Status: DC

## 2015-10-31 NOTE — Progress Notes (Signed)
Subjective: Patient presents today for evaluation of left foot pain. The patient states that he went on a hike yesterday where he hiked several miles and he noticed significant amounts of pain to his left foot. Patient presents today for further treatment and evaluation  Objective: Physical Exam General: The patient is alert and oriented x3 in no acute distress.  Dermatology: Skin is warm, dry and supple bilateral lower extremities. Negative for open lesions or macerations.  Vascular: Palpable pedal pulses bilaterally. No edema or erythema noted. Capillary refill within normal limits.  Neurological: Epicritic and protective threshold grossly intact bilaterally.   Musculoskeletal Exam: Pain on palpation to the left midfoot. Range of motion within normal limits to all pedal and ankle joints bilateral. Muscle strength 5/5 in all groups bilateral.   Radiographic Exam:  Normal osseous mineralization. Joint spaces preserved. No fracture/dislocation/boney destruction.     Assessment: #1 capsulitis Lisfranc joint left foot #2 pain in left foot #3 ecchymosis left foot dorsal #4 edema left foot Problem List Items Addressed This Visit    None    Visit Diagnoses    Foot pain, left    -  Primary        Plan of Care:  #1 Patient was evaluated. #2 Injection 0.5 mL Celestone Soluspan injected into the patient's left Lisfranc joint. #3 patient is to return to clinic in 4 weeks     Dr. Edrick Kins, [place]

## 2015-11-02 ENCOUNTER — Ambulatory Visit (INDEPENDENT_AMBULATORY_CARE_PROVIDER_SITE_OTHER): Payer: Medicare Other | Admitting: Internal Medicine

## 2015-11-02 ENCOUNTER — Encounter: Payer: Self-pay | Admitting: Internal Medicine

## 2015-11-02 VITALS — BP 156/94 | HR 60 | Temp 98.9°F

## 2015-11-02 DIAGNOSIS — I25810 Atherosclerosis of coronary artery bypass graft(s) without angina pectoris: Secondary | ICD-10-CM

## 2015-11-02 DIAGNOSIS — I6522 Occlusion and stenosis of left carotid artery: Secondary | ICD-10-CM

## 2015-11-02 DIAGNOSIS — I10 Essential (primary) hypertension: Secondary | ICD-10-CM

## 2015-11-02 DIAGNOSIS — I639 Cerebral infarction, unspecified: Secondary | ICD-10-CM | POA: Diagnosis not present

## 2015-11-02 DIAGNOSIS — Z23 Encounter for immunization: Secondary | ICD-10-CM

## 2015-11-02 DIAGNOSIS — N401 Enlarged prostate with lower urinary tract symptoms: Secondary | ICD-10-CM

## 2015-11-02 DIAGNOSIS — N138 Other obstructive and reflux uropathy: Secondary | ICD-10-CM

## 2015-11-02 DIAGNOSIS — J309 Allergic rhinitis, unspecified: Secondary | ICD-10-CM

## 2015-11-02 DIAGNOSIS — E785 Hyperlipidemia, unspecified: Secondary | ICD-10-CM

## 2015-11-02 NOTE — Assessment & Plan Note (Signed)
Continue Afrin nightly

## 2015-11-02 NOTE — Assessment & Plan Note (Signed)
No angina Continue Atenolol and Plavix Will refer to Dr. Rockey Situ

## 2015-11-02 NOTE — Assessment & Plan Note (Signed)
Slight memory issues He will continue Lipitor and Plavix for now

## 2015-11-02 NOTE — Patient Instructions (Signed)

## 2015-11-02 NOTE — Assessment & Plan Note (Signed)
S/p TURBT No medication

## 2015-11-02 NOTE — Assessment & Plan Note (Signed)
Will follow annual ultrasounds

## 2015-11-02 NOTE — Progress Notes (Signed)
HPI  Pt presents to the clinic today to establish care and for management of the conditions listed below. He is transferring care Dr. Felipa Velazquez.  HTN: He is not taking any medication for this. His BP today is 156/94. He reports his BP normally runs in the 140's/60's.   Allergic Rhinitis: Worse in the spring and fall. He takes Afrin nightly with good relief..  BPH: s/p TURBT. He does not take any medication for this. He does have dribbling on occasion.  CAD s/p CABG x 4 in 1988 with stent placement: He is taking Atenolol and Plavix as prescribed. He is following with Dr. Tamala Velazquez in [place].  GERD: He reports this is triggered by a hiatal hernia. He is taking Protonix daily as prescribed.  HLD: He takes Lipitor as prescribed. He denies myalgias. He does not consume a low fat diet.  Stroke: Ischemic. No residual weakness but he does have issues with memory. He is now living at [name] in [place].  Left Carotid Stenosis: Last doppler in 2013. He is taking Lipitor and Plavix as prescribed.  Flu: 11/2014 Tetanus: requesting records Pneumovax: requesting records Prevnar: requesting records Zostovax: requesting records PSA Screening: unsure Colonoscopy: 2011 Vision Screening: yearly Dentist: biannually  Past Medical History:  Diagnosis Date  . Allergic rhinitis   . Benign prostatic hypertrophy   . Coronary artery disease CARDIOLOGIST -- DR Jason Velazquez  . Duodenal diverticulum 2003  . GERD (gastroesophageal reflux disease)   . H/O hiatal hernia 2003  . Heart murmur   . Hepatic lesion 2004  . History of melanoma excision    FACE  . Hypercholesteremia   . Hypertension   . Left carotid stenosis    > 50%  PER DUPLEX  03-28-2011  . Mild mitral regurgitation   . Perianal fistula   . S/P CABG x 4    1988  . S/P drug eluting coronary stent placement    POST CABG--  STENTING 2000  AND RESTENTING 2006 FOR REINSTENT STENOSIS  . Stroke [facility])     Current Outpatient Prescriptions  Medication  Sig Dispense Refill  . atenolol (TENORMIN) 25 MG tablet Take 25 mg by mouth daily after breakfast.    . atorvastatin (LIPITOR) 20 MG tablet Take 20 mg by mouth at bedtime.    . clopidogrel (PLAVIX) 75 MG tablet Take 75 mg by mouth daily.    Marland Kitchen docusate sodium (COLACE) 100 MG capsule Take 200 mg by mouth 2 (two) times daily.     . nitroGLYCERIN (NITROSTAT) 0.4 MG SL tablet Place 0.4 mg under the tongue every 5 (five) minutes as needed for chest pain.     . NONFORMULARY OR COMPOUNDED ITEM [facility]:  Antiinflammatory cream - diclofenac 3%, Baclofen 2%, Cyclobenzaprine 2%, Lidocaine 2%, apply 1-2 grams to affected area 3-4 times a day. 120 each 2  . oxymetazoline (AFRIN) 0.05 % nasal spray Place 1 spray into both nostrils daily.    . pantoprazole (PROTONIX) 40 MG tablet Take 40 mg by mouth daily.    . polyethylene glycol (MIRALAX / GLYCOLAX) packet Take 17 g by mouth daily.     Current Facility-Administered Medications  Medication Dose Route Frequency Provider Last Rate Last Dose  . betamethasone acetate-betamethasone sodium phosphate (CELESTONE) injection 12 mg  12 mg Intramuscular Once Edrick Kins, DPM        Allergies  Allergen Reactions  . Iodine Rash    Rash when applied to skin  . Niacin And Related Hives and Other (  See Comments)    FLUSHING  . Adhesive [Tape] Rash    Family History  Problem Relation Age of Onset  . Breast cancer Mother   . Colon cancer Brother     Social History   Social History  . Marital status: Married    Spouse name: [name]  . Number of children: 3  . Years of education: B.D.   Occupational History  . retired    Social History Main Topics  . Smoking status: Never Smoker  . Smokeless tobacco: Never Used  . Alcohol use No  . Drug use: No  . Sexual activity: No   Other Topics Concern  . Not on file   Social History Narrative  . No narrative on file    ROS:  Constitutional: Denies fever, malaise, fatigue, headache or abrupt weight  changes.  HEENT: Denies eye pain, eye redness, ear pain, ringing in the ears, wax buildup, runny nose, nasal congestion, bloody nose, or sore throat. Respiratory: Denies difficulty breathing, shortness of breath, cough or sputum production.   Cardiovascular: Denies chest pain, chest tightness, palpitations or swelling in the hands or feet.  Gastrointestinal: Denies abdominal pain, bloating, constipation, diarrhea or blood in the stool.  GU: Denies frequency, urgency, pain with urination, blood in urine, odor or discharge. Musculoskeletal: Denies decrease in range of motion, difficulty with gait, muscle pain or joint pain and swelling.  Skin: Denies redness, rashes, lesions or ulcercations.  Neurological: Pt reports difficulty with memory. Denies dizziness, difficulty with speech or problems with balance and coordination.  Psych: Denies anxiety, depression, SI/HI.  No other specific complaints in a complete review of systems (except as listed in HPI above).  PE:  BP (!) 156/94 (BP Location: Left Arm, Patient Position: Sitting, Cuff Size: Normal)   Pulse 60   Temp 98.9 F (37.2 C) (Oral)   SpO2 98%  Wt Readings from Last 3 Encounters:  10/27/13 169 lb (76.7 kg)  10/20/13 169 lb (76.7 kg)  01/27/13 168 lb (76.2 kg)    General: Appears his stated age, well developed, well nourished in NAD. HEENT: Head: normal shape and size; Eyes: sclera white, no icterus, conjunctiva pink; Ears: Tm's gray and intact, normal light reflex;Throat/Mouth: Teeth present, mucosa pink and moist, no lesions or ulcerations noted.  Cardiovascular: Normal rate and rhythm. S1,S2 noted.  No murmur, rubs or gallops noted. No JVD or BLE edema. No carotid bruits noted. Pulmonary/Chest: Normal effort and positive vesicular breath sounds. No respiratory distress. No wheezes, rales or ronchi noted.  Abdomen: Soft and nontender. Normal bowel sounds, no bruits noted. No distention or masses noted. Liver, spleen and kidneys non  palpable. Musculoskeletal:  No difficulty with gait.  Neurological: Alert and oriented.  Psychiatric: Mood and affect normal. Behavior is normal. Judgment and thought content normal.   EKG:  BMET    Component Value Date/Time   NA 134 (L) 09/21/2013 0850   K 4.8 09/21/2013 0850   CL 98 09/21/2013 0850   CO2 28 09/21/2013 0850   GLUCOSE 93 09/21/2013 0850   BUN 12 09/21/2013 0850   CREATININE 0.92 09/21/2013 0850   CALCIUM 8.9 09/21/2013 0850   GFRNONAA 76 (L) 09/21/2013 0850   GFRAA 88 (L) 09/21/2013 0850    Lipid Panel     Component Value Date/Time   CHOL 126 05/27/2013 0335   CHOL 138 12/16/2012 0837   TRIG 85 05/27/2013 0335   HDL 40 05/27/2013 0335   HDL 46 12/16/2012 0837   CHOLHDL 3.2  05/27/2013 0335   VLDL 17 05/27/2013 0335   LDLCALC 69 05/27/2013 0335   LDLCALC 75 12/16/2012 0837    CBC    Component Value Date/Time   WBC 4.6 09/21/2013 0850   RBC 4.50 09/21/2013 0850   HGB 14.3 09/21/2013 0850   HCT 41.5 09/21/2013 0850   PLT 191 09/21/2013 0850   MCV 92.2 09/21/2013 0850   MCH 31.8 09/21/2013 0850   MCHC 34.5 09/21/2013 0850   RDW 12.6 09/21/2013 0850   LYMPHSABS 0.8 09/21/2013 0850   MONOABS 0.6 09/21/2013 0850   EOSABS 0.1 09/21/2013 0850   BASOSABS 0.0 09/21/2013 0850    Hgb A1C Lab Results  Component Value Date   HGBA1C 5.8 (H) 05/27/2013     Assessment and Plan:

## 2015-11-02 NOTE — Assessment & Plan Note (Signed)
Slightly high off meds, but he reports it is not usually this high Will have him come back in 2 weeks to recheck BP

## 2015-11-02 NOTE — Progress Notes (Signed)
Pre visit review using our clinic review tool, if applicable. No additional management support is needed unless otherwise documented below in the visit note. 

## 2015-11-02 NOTE — Assessment & Plan Note (Signed)
Encouraged him to consume a low fat diet Continue Lipitor

## 2015-11-06 ENCOUNTER — Ambulatory Visit: Payer: Medicare Other | Admitting: Podiatry

## 2015-11-07 ENCOUNTER — Encounter: Payer: Self-pay | Admitting: Cardiology

## 2015-11-07 ENCOUNTER — Encounter: Payer: Self-pay | Admitting: Podiatry

## 2015-11-07 ENCOUNTER — Ambulatory Visit (INDEPENDENT_AMBULATORY_CARE_PROVIDER_SITE_OTHER): Payer: Medicare Other | Admitting: Cardiology

## 2015-11-07 ENCOUNTER — Ambulatory Visit (INDEPENDENT_AMBULATORY_CARE_PROVIDER_SITE_OTHER): Payer: Medicare Other | Admitting: Podiatry

## 2015-11-07 ENCOUNTER — Ambulatory Visit: Payer: Medicare Other | Admitting: Podiatry

## 2015-11-07 VITALS — BP 140/70 | HR 64 | Ht 64.0 in | Wt 176.8 lb

## 2015-11-07 DIAGNOSIS — M778 Other enthesopathies, not elsewhere classified: Secondary | ICD-10-CM

## 2015-11-07 DIAGNOSIS — I639 Cerebral infarction, unspecified: Secondary | ICD-10-CM | POA: Diagnosis not present

## 2015-11-07 DIAGNOSIS — Z951 Presence of aortocoronary bypass graft: Secondary | ICD-10-CM | POA: Diagnosis not present

## 2015-11-07 DIAGNOSIS — M779 Enthesopathy, unspecified: Secondary | ICD-10-CM

## 2015-11-07 DIAGNOSIS — R4020 Unspecified coma: Secondary | ICD-10-CM

## 2015-11-07 DIAGNOSIS — M79672 Pain in left foot: Secondary | ICD-10-CM | POA: Diagnosis not present

## 2015-11-07 DIAGNOSIS — Z9861 Coronary angioplasty status: Secondary | ICD-10-CM

## 2015-11-07 DIAGNOSIS — M7752 Other enthesopathy of left foot: Secondary | ICD-10-CM

## 2015-11-07 DIAGNOSIS — I10 Essential (primary) hypertension: Secondary | ICD-10-CM

## 2015-11-07 DIAGNOSIS — Z7689 Persons encountering health services in other specified circumstances: Secondary | ICD-10-CM

## 2015-11-07 DIAGNOSIS — R404 Transient alteration of awareness: Secondary | ICD-10-CM

## 2015-11-07 DIAGNOSIS — Z7189 Other specified counseling: Secondary | ICD-10-CM | POA: Diagnosis not present

## 2015-11-07 DIAGNOSIS — M7750 Other enthesopathy of unspecified foot: Secondary | ICD-10-CM

## 2015-11-07 MED ORDER — MELOXICAM 15 MG PO TABS: 15.0000 mg | ORAL_TABLET | Freq: Every day | ORAL | 1 refills | Status: AC

## 2015-11-07 NOTE — Progress Notes (Signed)
Cardiology Office Note   Date:  11/07/2015   ID:  Jason Velazquez, DOB April 15, 1928, MRN [id number]  Referring Doctor:  Webb Silversmith, NP   Cardiologist:   Wende Bushy, MD   Reason for consultation:  Chief Complaint  Patient presents with  . Establish Care    coming for BP issues, per pt   History of CAD   History of Present Illness: Jason Velazquez is a 82 y.o. male who presents for establishing care for coronary artery disease. Patient used to be seeing Dr. Tamala Julian.  Patient was told that his blood pressure recently has been elevated 160/70 today is much better, within normal limits. He has not really been checking his blood pressure frequently at home. His wife used to do that for him, but she has been gone for about a year now.  Patient mostly eats out as he lives by himself and does not cook. He admits to dietary indiscretion at times.  Patient denies chest pain and shortness of breath. He reports compliance to medications.  Patient denies PND, orthopnea, edema, abdominal pain. No passing out. No palpitations.   ROS:  Please see the history of present illness. Aside from mentioned under HPI, all other systems are reviewed and negative.     Past Medical History:  Diagnosis Date  . Allergic rhinitis   . Benign prostatic hypertrophy   . Coronary artery disease CARDIOLOGIST -- DR Daneen Schick  . Duodenal diverticulum 2003  . GERD (gastroesophageal reflux disease)   . H/O hiatal hernia 2003  . Heart murmur   . Hepatic lesion 2004  . History of melanoma excision    FACE  . Hypercholesteremia   . Hypertension   . Left carotid stenosis    > 50%  PER DUPLEX  03-28-2011  . Mild mitral regurgitation   . Perianal fistula   . S/P CABG x 4    1988  . S/P drug eluting coronary stent placement    POST CABG--  STENTING 2000  AND RESTENTING 2006 FOR REINSTENT STENOSIS  . Stroke [facility])     Past Surgical History:  Procedure Laterality Date  . CATARACT EXTRACTION W/  INTRAOCULAR LENS  IMPLANT, BILATERAL    . CORONARY ANGIOPLASTY WITH STENT PLACEMENT  2000   PCI AND STENTING CIRCUMFLEX  . CORONARY ANGIOPLASTY WITH STENT PLACEMENT  04-30-2004  DR Daneen Schick   RE-INSTENT STENOSIS/  DRUG-ELUTING STENT OF PROXIMAL AND MID CIRCUMFLEX/ PCI MID CIRCUMFLEX THROUGH STENT STRUT/  WIDELY PATENT SAPHENOUS VEIN GRAFT AND  LIMA TO LAD GRAFT/ TOTAL OCCLUSION RIGHT CORONARY BEYOND THE POSTERIOR DESCENDING ARTERY BRANCH WITH 50% OSTIAL NARROWING/ TOTAL OCCLUSION OF LAD AT THE OSTIUM OF THE LEFT MAIN  . CORONARY ARTERY BYPASS GRAFT  1988   X5  . EVALUATION UNDER ANESTHESIA WITH ANAL FISTULECTOMY N/A 07/22/2012   Procedure: EXAM UNDER ANESTHESIA WITH ANAL fistulotomy;  Surgeon: Leighton Ruff, MD;  Location: [place];  Service: General;  Laterality: N/A;  . SHOULDER ARTHROSCOPY WITH OPEN ROTATOR CUFF REPAIR Right 07-10-1999  . TONSILLECTOMY AND ADENOIDECTOMY  1944  . TRANSTHORACIC ECHOCARDIOGRAM  03-01-2011  DR Daneen Schick   NORMAL LVF AND LV SIZE/ MILD LEFT ATRIAL ENLARGEMENT/ GRADE II DIASTOLIC DYSFUNCTION WITH ELEVATED LEFT ATRIAL PRESSURE/ MILD TO MODERATE MR/ MILD TR  . TRANSURETHRAL RESECTION OF PROSTATE  04/09/2011   Procedure: TRANSURETHRAL RESECTION OF THE PROSTATE WITH GYRUS INSTRUMENTS;  Surgeon: Malka So, MD;  Location: [facility];  Service: Urology;  Laterality: N/A;  reports that he has never smoked. He has never used smokeless tobacco. He reports that he does not drink alcohol or use drugs.   family history includes Breast cancer in his mother; Colon cancer in his brother; Stroke in his father.   Outpatient Medications Prior to Visit  Medication Sig Dispense Refill  . atenolol (TENORMIN) 25 MG tablet Take 25 mg by mouth daily after breakfast.    . atorvastatin (LIPITOR) 20 MG tablet Take 20 mg by mouth at bedtime.    . clopidogrel (PLAVIX) 75 MG tablet Take 75 mg by mouth daily.    Marland Kitchen docusate sodium (COLACE) 100 MG capsule Take 200 mg by  mouth 2 (two) times daily.     . nitroGLYCERIN (NITROSTAT) 0.4 MG SL tablet Place 0.4 mg under the tongue every 5 (five) minutes as needed for chest pain.     Marland Kitchen oxymetazoline (AFRIN) 0.05 % nasal spray Place 1 spray into both nostrils daily.    . pantoprazole (PROTONIX) 40 MG tablet Take 40 mg by mouth daily.    . polyethylene glycol (MIRALAX / GLYCOLAX) packet Take 17 g by mouth daily.    . NONFORMULARY OR COMPOUNDED ITEM [facility]:  Antiinflammatory cream - diclofenac 3%, Baclofen 2%, Cyclobenzaprine 2%, Lidocaine 2%, apply 1-2 grams to affected area 3-4 times a day. 120 each 2   Facility-Administered Medications Prior to Visit  Medication Dose Route Frequency Provider Last Rate Last Dose  . betamethasone acetate-betamethasone sodium phosphate (CELESTONE) injection 12 mg  12 mg Intramuscular Once Edrick Kins, DPM         Allergies: Iodine; Niacin and related; and Adhesive [tape]    PHYSICAL EXAM: VS:  BP 140/70 (BP Location: Left Arm, Patient Position: Sitting, Cuff Size: Normal)   Pulse 64   Ht 5\' 4"  (1.626 m)   Wt 176 lb 12.8 oz (80.2 kg)   SpO2 97%   BMI 30.35 kg/m  , Body mass index is 30.35 kg/m. Wt Readings from Last 3 Encounters:  11/07/15 176 lb 12.8 oz (80.2 kg)  10/27/13 169 lb (76.7 kg)  10/20/13 169 lb (76.7 kg)    GENERAL:  well developed, well nourished,  obese, not in acute distress HEENT: normocephalic, pink conjunctivae, anicteric sclerae, no xanthelasma, normal dentition, oropharynx clear NECK:  no neck vein engorgement, JVP normal, no hepatojugular reflux, carotid upstroke brisk and symmetric, no bruit, no thyromegaly, no lymphadenopathy LUNGS:  good respiratory effort, clear to auscultation bilaterally CV:  PMI not displaced, no thrills, no lifts, S1 and S2 within normal limits, no palpable S3 or S4, no murmurs, no rubs, no gallops ABD:  Soft, nontender, nondistended, normoactive bowel sounds, no abdominal aortic bruit, no hepatomegaly, no  splenomegaly MS: nontender back, no kyphosis, no scoliosis, no joint deformities EXT:  2+ DP/PT pulses, no edema, no varicosities, no cyanosis, no clubbing SKIN: warm, nondiaphoretic, normal turgor, no ulcers NEUROPSYCH: alert, oriented to person, place, and time, sensory/motor grossly intact, normal mood, appropriate affect  Recent Labs: No results found for requested labs within last 8760 hours.   Lipid Panel    Component Value Date/Time   CHOL 126 05/27/2013 0335   CHOL 138 12/16/2012 0837   TRIG 85 05/27/2013 0335   HDL 40 05/27/2013 0335   HDL 46 12/16/2012 0837   CHOLHDL 3.2 05/27/2013 0335   VLDL 17 05/27/2013 0335   LDLCALC 69 05/27/2013 0335   LDLCALC 75 12/16/2012 0837     Other studies Reviewed:  EKG:  The ekg from  11/07/2015 was personally reviewed by me and it revealed sinus rhythm, 60 BPM.  Additional studies/ records that were reviewed personally reviewed by me today include: Echo 05/27/2013: Left ventricle: The cavity size was normal. Wall thickness was increased in a pattern of mild LVH. Systolic function was normal. The estimated ejection fraction was in the range of 50% to 55%. Wall motion was normal; there were no regional wall motion abnormalities. Doppler parameters are consistent with abnormal left ventricular relaxation (grade 1 diastolic dysfunction). - Left atrium: The atrium was mildly dilated. - Right atrium: The atrium was mildly dilated.   ASSESSMENT AND PLAN: CAD status post CABG, PCI No ongoing symptoms. Continue medical therapy. Patient is on Plavix for history of stroke. Patient has not needed nitroglycerin in the last 5 years. Risk factor modification, lifestyle changes recommended.  Hypertension Blood pressure log recommended. Patient to call our office if persisting more than 140/80. Continue medications for now. If blood pressure is higher than goal, will likely need to start another medication is heart rate is in the  60s with low dose of atenolol.  Hyperlipidemia PCP following labs. Ideal LDL goal is less than 70 due to history CAD. We talked about importance of dietary modifications. We both agree that it is hard to address this especially when he eats out most of the times. Recommended choosing the healthier options if available.   Current medicines are reviewed at length with the patient today.  The patient does not have concerns regarding medicines.  Labs/ tests ordered today include:  Orders Placed This Encounter  Procedures  . EKG 12-Lead    I had a lengthy and detailed discussion with the patient regarding diagnoses, prognosis, diagnostic options, treatment options , and side effects of medications.   I counseled the patient on importance of lifestyle modification including heart healthy diet, regular physical activity .   Disposition:   FU with undersigned In 6 months  I spent at least 40 minutes with the patient today and more than 50% of the time was spent counseling the patient and coordinating care.     Signed, Wende Bushy, MD  11/07/2015 3:32 PM    Jason Velazquez  This note was generated in part with voice recognition software and I apologize for any typographical errors that were not detected and corrected.

## 2015-11-07 NOTE — Patient Instructions (Signed)
Call if your blood pressures are over 140/80.    Follow-Up: Your physician wants you to follow-up in: 6 months with Dr. Yvone Neu. You will receive a reminder letter in the mail two months in advance. If you don't receive a letter, please call our office to schedule the follow-up appointment.  It was a pleasure seeing you today here in the office. Please do not hesitate to give [place] a call back if you have any further questions. [place], BSN

## 2015-11-07 NOTE — Progress Notes (Signed)
Subjective: Patient presents today for follow-up evaluation of left foot pain. Patient states that he is no better. He states that the injection into the Lisfranc joint did not help. Patient presents today with pain and tenderness with some mild swelling of his left foot and ankle.   Objective: Physical Exam General: The patient is alert and oriented x3 in no acute distress.  Dermatology: Skin is warm, dry and supple bilateral lower extremities. Negative for open lesions or macerations.  Vascular: Palpable pedal pulses bilaterally. No edema or erythema noted. Capillary refill within normal limits.  Neurological: Epicritic and protective threshold grossly intact bilaterally.   Musculoskeletal Exam: Pain on palpation to the first metatarsal cuneiform joint at the insertion of the tibialis anterior tendon with pain tracking proximally up the tendon. Also pain on palpation to the lateral aspect of the left ankle.  Moderate pitting edema noted to the left foot and ankle.  Range of motion within normal limits to all pedal and ankle joints bilateral. Muscle strength 5/5 in all groups bilateral.    Assessment:  #1 tibialis anterior enthesopathy left #2 ankle joint synovitis left #3 tibialis anterior tendinitis left #4 posterior tibial tendon dysfunction left foot upon weightbearing mid stance. #5 pain in left foot and ankle   Problem List Items Addressed This Visit    None    Visit Diagnoses   None.     Plan of Care:  #1 Patient was evaluated. #2 Injection of 0.5 mL Celestone Soluspan injected into the insertion site of the tibialis anterior of the left foot joint capsule of the first metatarsal cuneiform joint. #3 prescription for anti-inflammatory pain cream was dispensed through [facility]. #4 perception for meloxicam was prescribed #5 today we dispensed an ankle brace for the left foot and ankle to provide support and partial immobilization #6 patient is to return to clinic  in 4 weeks  The patient does not improve over the following 4 weeks he will require an MRI     Dr. Edrick Kins, [place]

## 2015-11-17 ENCOUNTER — Ambulatory Visit: Payer: Medicare Other | Admitting: Podiatry

## 2015-11-22 DIAGNOSIS — H0016 Chalazion left eye, unspecified eyelid: Secondary | ICD-10-CM | POA: Diagnosis not present

## 2015-11-23 ENCOUNTER — Ambulatory Visit: Payer: Medicare Other | Admitting: Podiatry

## 2015-11-28 DIAGNOSIS — Z961 Presence of intraocular lens: Secondary | ICD-10-CM | POA: Diagnosis not present

## 2015-12-05 ENCOUNTER — Encounter: Payer: Self-pay | Admitting: Podiatry

## 2015-12-05 ENCOUNTER — Ambulatory Visit (INDEPENDENT_AMBULATORY_CARE_PROVIDER_SITE_OTHER): Payer: Medicare Other | Admitting: Podiatry

## 2015-12-05 VITALS — BP 139/75 | HR 67 | Resp 16

## 2015-12-05 DIAGNOSIS — M7752 Other enthesopathy of left foot: Secondary | ICD-10-CM | POA: Diagnosis not present

## 2015-12-05 DIAGNOSIS — M25572 Pain in left ankle and joints of left foot: Secondary | ICD-10-CM | POA: Diagnosis not present

## 2015-12-05 DIAGNOSIS — M659 Synovitis and tenosynovitis, unspecified: Secondary | ICD-10-CM

## 2015-12-05 DIAGNOSIS — M779 Enthesopathy, unspecified: Secondary | ICD-10-CM | POA: Diagnosis not present

## 2015-12-05 DIAGNOSIS — M7750 Other enthesopathy of unspecified foot: Secondary | ICD-10-CM

## 2015-12-05 DIAGNOSIS — M19072 Primary osteoarthritis, left ankle and foot: Secondary | ICD-10-CM

## 2015-12-05 MED ORDER — NONFORMULARY OR COMPOUNDED ITEM: 1.0000 g | Freq: Four times a day (QID) | 2 refills | Status: DC

## 2015-12-11 DIAGNOSIS — H0016 Chalazion left eye, unspecified eyelid: Secondary | ICD-10-CM | POA: Diagnosis not present

## 2015-12-17 MED ORDER — BETAMETHASONE SOD PHOS & ACET 6 (3-3) MG/ML IJ SUSP: 3.0000 mg | Freq: Once | INTRAMUSCULAR | Status: DC

## 2015-12-17 NOTE — Progress Notes (Signed)
Subjective:  Patient presents today for pain and tenderness to the left ankle. Patient relates significant pain and tenderness when walking.  Patient states he noticed significant improvement with the last injection.  Patient presents for further treatment and evaluation.  Objective / Physical Exam:  General:  The patient is alert and oriented x3 in no acute distress. Dermatology:  Skin is warm, dry and supple bilateral lower extremities. Negative for open lesions or macerations. Vascular:  Palpable pedal pulses bilaterally. No edema or erythema noted. Capillary refill within normal limits. Neurological:  Epicritic and protective threshold grossly intact bilaterally.  Musculoskeletal Exam:  Pain on palpation to the anterior lateral medial aspects of the patient's left ankle. Mild edema noted.  Range of motion within normal limits to all pedal and ankle joints bilateral. Muscle strength 5/5 in all groups bilateral.   Radiographic Exam:  Normal osseous mineralization. Joint spaces preserved. No fracture/dislocation/boney destruction.    Assessment: #1 pain in left ankle #2 synovitis of left ankle #3 capsulitis of left ankle  Plan of Care:  #1 Patient was evaluated. #2 injection of 0.5 mL Celestone Soluspan injected in the patient's left ankle. #3 continue ankle brace, meloxicam, and pain cream as needed.  #4 patient is to return to clinic in 4 weeks   Dr. Edrick Kins, [place]

## 2015-12-21 DIAGNOSIS — X32XXXA Exposure to sunlight, initial encounter: Secondary | ICD-10-CM | POA: Diagnosis not present

## 2015-12-21 DIAGNOSIS — Z08 Encounter for follow-up examination after completed treatment for malignant neoplasm: Secondary | ICD-10-CM | POA: Diagnosis not present

## 2015-12-21 DIAGNOSIS — Z85820 Personal history of malignant melanoma of skin: Secondary | ICD-10-CM | POA: Diagnosis not present

## 2015-12-21 DIAGNOSIS — L570 Actinic keratosis: Secondary | ICD-10-CM | POA: Diagnosis not present

## 2015-12-21 DIAGNOSIS — L821 Other seborrheic keratosis: Secondary | ICD-10-CM | POA: Diagnosis not present

## 2016-01-09 ENCOUNTER — Ambulatory Visit (INDEPENDENT_AMBULATORY_CARE_PROVIDER_SITE_OTHER): Payer: Medicare Other | Admitting: Podiatry

## 2016-01-09 ENCOUNTER — Encounter: Payer: Self-pay | Admitting: Podiatry

## 2016-01-09 DIAGNOSIS — M659 Synovitis and tenosynovitis, unspecified: Secondary | ICD-10-CM | POA: Diagnosis not present

## 2016-01-09 DIAGNOSIS — M7752 Other enthesopathy of left foot: Secondary | ICD-10-CM

## 2016-01-09 DIAGNOSIS — I639 Cerebral infarction, unspecified: Secondary | ICD-10-CM | POA: Diagnosis not present

## 2016-01-09 DIAGNOSIS — M19079 Primary osteoarthritis, unspecified ankle and foot: Secondary | ICD-10-CM | POA: Diagnosis not present

## 2016-01-09 DIAGNOSIS — M25572 Pain in left ankle and joints of left foot: Secondary | ICD-10-CM | POA: Diagnosis not present

## 2016-01-10 NOTE — Progress Notes (Signed)
Subjective:  Patient presents today for pain and tenderness to the left ankle. Patient relates significant pain and tenderness when walking.  Patient states he noticed significant improvement with the last injection. Patient states that he is feeling better however he does notice pain every once in a while and his instep hurts. Patient presents for further treatment and evaluation.  Objective / Physical Exam:  General:  The patient is alert and oriented x3 in no acute distress. Dermatology:  Skin is warm, dry and supple bilateral lower extremities. Negative for open lesions or macerations. Vascular:  Palpable pedal pulses bilaterally. No edema or erythema noted. Capillary refill within normal limits. Neurological:  Epicritic and protective threshold grossly intact bilaterally.  Musculoskeletal Exam:  Prominent navicular noted to the left foot. Pain on palpation noted. Pain on palpation to the anterior lateral medial aspects of the patient's left ankle. Mild edema noted.  Range of motion within normal limits to all pedal and ankle joints bilateral. Muscle strength 5/5 in all groups bilateral.   Radiographic Exam:  Normal osseous mineralization. Joint spaces preserved. No fracture/dislocation/boney destruction.    Assessment: #1 pain in left ankle #2 synovitis of left ankle #3 capsulitis of left ankle #4 prominent navicular bone left foot  Plan of Care:  #1 Patient was evaluated. #2 continue conservative modalities to alleviate symptoms. Discussed the patient with a prominent navicular bone can best be treated conservatively using padding and shoe gear modifications. #3 continue ankle brace, meloxicam, and pain cream as needed #4 return to clinic when necessary   Dr. Edrick Kins, [place]

## 2016-01-12 ENCOUNTER — Encounter: Payer: Self-pay | Admitting: Family Medicine

## 2016-01-12 ENCOUNTER — Ambulatory Visit (INDEPENDENT_AMBULATORY_CARE_PROVIDER_SITE_OTHER): Payer: Medicare Other | Admitting: Family Medicine

## 2016-01-12 VITALS — BP 142/76 | HR 63 | Temp 97.7°F | Ht 64.0 in | Wt 177.8 lb

## 2016-01-12 DIAGNOSIS — Z8673 Personal history of transient ischemic attack (TIA), and cerebral infarction without residual deficits: Secondary | ICD-10-CM

## 2016-01-12 DIAGNOSIS — B9789 Other viral agents as the cause of diseases classified elsewhere: Secondary | ICD-10-CM | POA: Diagnosis not present

## 2016-01-12 DIAGNOSIS — J069 Acute upper respiratory infection, unspecified: Secondary | ICD-10-CM | POA: Diagnosis not present

## 2016-01-12 MED ORDER — BENZONATATE 200 MG PO CAPS: 200.0000 mg | ORAL_CAPSULE | Freq: Three times a day (TID) | ORAL | 1 refills | Status: DC | PRN

## 2016-01-12 NOTE — Patient Instructions (Signed)
I think you have a head and chest cold - and you are early in it  Watch for fever/ shortness of breath/ wheezing or getting worse  Get mucinex over the counter for cough and congestion (an expectorant) - plain  Drink lots of water  Try the tessalon pills for cough - swallow whole three times daily  This will help quiet the cough   Update if not starting to improve in a week or if worsening    Afrin is fine for a few days at a time only  Nasal saline is ok all the time

## 2016-01-12 NOTE — Progress Notes (Signed)
Pre visit review using our clinic review tool, if applicable. No additional management support is needed unless otherwise documented below in the visit note. 

## 2016-01-12 NOTE — Progress Notes (Signed)
Subjective:    Patient ID: Jason Velazquez, male    DOB: [birth year], 82 y.o.   MRN: [id number]  HPI Here for uri symptoms   Thinks he is fighting a cold  Mod to severe cough all day yesterday - worse today  Lots of nasal congestion also   Mucous is mostly white to clear  occ yellow   No fever at all   No headache  Ears do not hurt  At times his throat is scratchy-otherwise   No otc meds  Uses afrin nasal spray and saline solution   Lives at [facility]- exp to sick people all the time incl a neighbor   Patient Active Problem List   Diagnosis Date Noted  . Viral URI with cough 01/12/2016  . Allergic rhinitis 11/02/2015  . Left carotid stenosis 11/02/2015  . Acute ischemic stroke ([place]) 05/26/2013  . HTN (hypertension) 05/26/2013  . HLD (hyperlipidemia) 05/26/2013  . CAD (coronary artery disease) 05/26/2013  . BPH (benign prostatic hypertrophy) with urinary obstruction 04/09/2011   Past Medical History:  Diagnosis Date  . Allergic rhinitis   . Benign prostatic hypertrophy   . Coronary artery disease CARDIOLOGIST -- DR Daneen Schick  . Duodenal diverticulum 2003  . GERD (gastroesophageal reflux disease)   . H/O hiatal hernia 2003  . Heart murmur   . Hepatic lesion 2004  . History of melanoma excision    FACE  . Hypercholesteremia   . Hypertension   . Left carotid stenosis    > 50%  PER DUPLEX  03-28-2011  . Mild mitral regurgitation   . Perianal fistula   . S/P CABG x 4    1988  . S/P drug eluting coronary stent placement    POST CABG--  STENTING 2000  AND RESTENTING 2006 FOR REINSTENT STENOSIS  . Stroke [facility])    Past Surgical History:  Procedure Laterality Date  . CATARACT EXTRACTION W/ INTRAOCULAR LENS  IMPLANT, BILATERAL    . CORONARY ANGIOPLASTY WITH STENT PLACEMENT  2000   PCI AND STENTING CIRCUMFLEX  . CORONARY ANGIOPLASTY WITH STENT PLACEMENT  04-30-2004  DR Daneen Schick   RE-INSTENT STENOSIS/  DRUG-ELUTING STENT OF PROXIMAL AND MID CIRCUMFLEX/ PCI MID  CIRCUMFLEX THROUGH STENT STRUT/  WIDELY PATENT SAPHENOUS VEIN GRAFT AND  LIMA TO LAD GRAFT/ TOTAL OCCLUSION RIGHT CORONARY BEYOND THE POSTERIOR DESCENDING ARTERY BRANCH WITH 50% OSTIAL NARROWING/ TOTAL OCCLUSION OF LAD AT THE OSTIUM OF THE LEFT MAIN  . CORONARY ARTERY BYPASS GRAFT  1988   X5  . EVALUATION UNDER ANESTHESIA WITH ANAL FISTULECTOMY N/A 07/22/2012   Procedure: EXAM UNDER ANESTHESIA WITH ANAL fistulotomy;  Surgeon: Leighton Ruff, MD;  Location: [place];  Service: General;  Laterality: N/A;  . SHOULDER ARTHROSCOPY WITH OPEN ROTATOR CUFF REPAIR Right 07-10-1999  . TONSILLECTOMY AND ADENOIDECTOMY  1944  . TRANSTHORACIC ECHOCARDIOGRAM  03-01-2011  DR Daneen Schick   NORMAL LVF AND LV SIZE/ MILD LEFT ATRIAL ENLARGEMENT/ GRADE II DIASTOLIC DYSFUNCTION WITH ELEVATED LEFT ATRIAL PRESSURE/ MILD TO MODERATE MR/ MILD TR  . TRANSURETHRAL RESECTION OF PROSTATE  04/09/2011   Procedure: TRANSURETHRAL RESECTION OF THE PROSTATE WITH GYRUS INSTRUMENTS;  Surgeon: Malka So, MD;  Location: [facility];  Service: Urology;  Laterality: N/A;   Social History  Substance Use Topics  . Smoking status: Never Smoker  . Smokeless tobacco: Never Used  . Alcohol use No   Family History  Problem Relation Age of Onset  . Breast cancer Mother   .  Stroke Father   . Colon cancer Brother    Allergies  Allergen Reactions  . Iodine Rash    Rash when applied to skin  . Niacin And Related Hives and Other (See Comments)    FLUSHING  . Adhesive [Tape] Rash   Current Outpatient Prescriptions on File Prior to Visit  Medication Sig Dispense Refill  . atenolol (TENORMIN) 25 MG tablet Take 25 mg by mouth daily after breakfast.    . atorvastatin (LIPITOR) 20 MG tablet Take 20 mg by mouth at bedtime.    . clopidogrel (PLAVIX) 75 MG tablet Take 75 mg by mouth daily.    Marland Kitchen docusate sodium (COLACE) 100 MG capsule Take 200 mg by mouth 2 (two) times daily.     . nitroGLYCERIN (NITROSTAT) 0.4 MG SL tablet Place  0.4 mg under the tongue every 5 (five) minutes as needed for chest pain.     . NONFORMULARY OR COMPOUNDED ITEM Apply 1-2 g topically 4 (four) times daily. 120 each 2  . oxymetazoline (AFRIN) 0.05 % nasal spray Place 1 spray into both nostrils daily.    . pantoprazole (PROTONIX) 40 MG tablet Take 40 mg by mouth daily.    . polyethylene glycol (MIRALAX / GLYCOLAX) packet Take 17 g by mouth daily.     Current Facility-Administered Medications on File Prior to Visit  Medication Dose Route Frequency Provider Last Rate Last Dose  . betamethasone acetate-betamethasone sodium phosphate (CELESTONE) injection 12 mg  12 mg Intramuscular Once Edrick Kins, DPM      . betamethasone acetate-betamethasone sodium phosphate (CELESTONE) injection 3 mg  3 mg Intramuscular Once Edrick Kins, DPM        Review of Systems  Constitutional: Positive for appetite change and fatigue. Negative for fever.  HENT: Positive for congestion, postnasal drip, rhinorrhea, sinus pressure, sneezing and sore throat. Negative for ear pain.   Eyes: Negative for pain and discharge.  Respiratory: Positive for cough. Negative for shortness of breath, wheezing and stridor.   Cardiovascular: Negative for chest pain.  Gastrointestinal: Negative for diarrhea, nausea and vomiting.  Genitourinary: Negative for frequency, hematuria and urgency.  Musculoskeletal: Negative for arthralgias and myalgias.  Skin: Negative for rash.  Neurological: Positive for headaches. Negative for dizziness, weakness and light-headedness.  Psychiatric/Behavioral: Negative for confusion and dysphoric mood.       Objective:   Physical Exam  Constitutional: He appears well-developed and well-nourished. No distress.  Well appearing   HENT:  Head: Normocephalic and atraumatic.  Right Ear: External ear normal.  Left Ear: External ear normal.  Mouth/Throat: Oropharynx is clear and moist.  Nares are injected and congested  No sinus tenderness Clear  rhinorrhea and post nasal drip   Eyes: Conjunctivae and EOM are normal. Pupils are equal, round, and reactive to light. Right eye exhibits no discharge. Left eye exhibits no discharge.  Neck: Normal range of motion. Neck supple.  Cardiovascular: Normal rate and normal heart sounds.   Pulmonary/Chest: Effort normal and breath sounds normal. No respiratory distress. He has no wheezes. He has no rales. He exhibits no tenderness.  Good air exch  No rales or rhonchi  Lymphadenopathy:    He has no cervical adenopathy.  Neurological: He is alert.  Skin: Skin is warm and dry. No rash noted.  Psychiatric: He has a normal mood and affect.          Assessment & Plan:   Problem List Items Addressed This Visit      Respiratory  Viral URI with cough    Mild with re assuring exam in 2nd day of symptoms and no fever  No wheezing  Px tessalon for cough Recommend mucinex for expectorant and fluids and rest  Disc symptomatic care - see instructions on AVS  Update if not starting to improve in a week or if worsening

## 2016-01-12 NOTE — Assessment & Plan Note (Signed)
Mild with re assuring exam in 2nd day of symptoms and no fever  No wheezing  Px tessalon for cough Recommend mucinex for expectorant and fluids and rest  Disc symptomatic care - see instructions on AVS  Update if not starting to improve in a week or if worsening

## 2016-01-22 ENCOUNTER — Telehealth: Payer: Self-pay | Admitting: *Deleted

## 2016-01-22 MED ORDER — AZITHROMYCIN 250 MG PO TABS: ORAL_TABLET | ORAL | 0 refills | Status: DC

## 2016-01-22 NOTE — Telephone Encounter (Signed)
I will send px for zithromax to [facility] pharmacy [facility] F/u later in the week  if no improvement If worse- f/u sooner  Will cc to pcp

## 2016-01-22 NOTE — Telephone Encounter (Signed)
Patient left a voicemail stating that he was seen on 01/12/16 and he is not any better and was told to call back if no improvement. Patient stated medication given has not helped at all. [facility]  Left message on voicemail for patient to call back. Need to get symptoms.

## 2016-01-22 NOTE — Telephone Encounter (Signed)
Pt notified Rx sent to pharmacy and advise of Dr. Marliss Coots instructions and verbalized understanding

## 2016-01-22 NOTE — Telephone Encounter (Signed)
Spoke to patient and was advised that she has a non-productive cough, stuffy nose, no fever, no SOB, but has had some wheezing occasionally. Patient want to know what he should do?

## 2016-03-06 ENCOUNTER — Telehealth: Payer: Self-pay | Admitting: Internal Medicine

## 2016-03-06 NOTE — Telephone Encounter (Signed)
Fine with me. Nice guy, I only saw him once.

## 2016-03-06 NOTE — Telephone Encounter (Signed)
Patient is calling to ask if he can switch from [place] to [place].  Patient said Dr.Duncan saw his wife,Grace. Can patient switch to Dr.Duncan?

## 2016-03-07 NOTE — Telephone Encounter (Signed)
Okay with med.  Looks like he is due for labs.  Would need 32min OV with me when possible.  Thanks.

## 2016-03-14 ENCOUNTER — Encounter: Payer: Self-pay | Admitting: Family Medicine

## 2016-03-14 ENCOUNTER — Ambulatory Visit (INDEPENDENT_AMBULATORY_CARE_PROVIDER_SITE_OTHER): Payer: Medicare Other | Admitting: Family Medicine

## 2016-03-14 VITALS — BP 140/88 | HR 61 | Temp 98.1°F | Ht 64.0 in | Wt 182.5 lb

## 2016-03-14 DIAGNOSIS — I2510 Atherosclerotic heart disease of native coronary artery without angina pectoris: Secondary | ICD-10-CM | POA: Diagnosis not present

## 2016-03-14 DIAGNOSIS — Z7189 Other specified counseling: Secondary | ICD-10-CM | POA: Insufficient documentation

## 2016-03-14 NOTE — Patient Instructions (Addendum)
Fasting labs when possible.  Get a lab appointment on the way out.   Try to limit salt.  Take care.  Glad to see you.  Update me as needed.

## 2016-03-14 NOTE — Progress Notes (Signed)
Active at [facility], exercising 3 times a week.  D/w pt about diet, some at home, some at restaurants.  He still gets lonely at night, after his wife died.  He is trying to adjust to the changes.    H/o CAD.  No CP, no SOB, no BLE edema usually but had some BLE at night recently.  Due for labs.  He is sitting a lot at night before bed and that may contribute.  He likes to eat salty snacks- popcorn, etc. D/w pt.   PNA vaccines prev done, both PNA 23 and 13 prev done per patient.  Both done at least 2 years ago with prior PCP.   Shingles shot prev done.   Flu shot prev done.    Living will d/w pt.  Daughter Wells Guiles designated if patient were incapacitated.    PMH and SH reviewed  ROS: Per HPI unless specifically indicated in ROS section   Meds, vitals, and allergies reviewed.   GEN: nad, alert and oriented HEENT: mucous membranes moist NECK: supple w/o LA CV: rrr.  PULM: ctab, no inc wob ABD: soft, +bs EXT: no edema SKIN: no acute rash

## 2016-03-14 NOTE — Progress Notes (Signed)
Pre visit review using our clinic review tool, if applicable. No additional management support is needed unless otherwise documented below in the visit note. 

## 2016-03-14 NOTE — Assessment & Plan Note (Signed)
No chest pain. Not short of breath. Continue current medications. Return for labs. Some dependent edema noted then to today. This point he has been sitting for prolonged period of time. He likes salty's next. Discussed with patient about diet and continuing compression stockings. Update me as needed. He agrees. Vaccine list, history updated. We will get his labs and then go from there. He agrees with plan. Okay for outpatient follow-up. >25 minutes spent in face to face time with patient, >50% spent in counselling or coordination of care.

## 2016-03-15 ENCOUNTER — Other Ambulatory Visit (INDEPENDENT_AMBULATORY_CARE_PROVIDER_SITE_OTHER): Payer: Medicare Other

## 2016-03-15 DIAGNOSIS — I2510 Atherosclerotic heart disease of native coronary artery without angina pectoris: Secondary | ICD-10-CM | POA: Diagnosis not present

## 2016-03-15 LAB — CBC WITH DIFFERENTIAL/PLATELET
BASOS PCT: 0.9 % (ref 0.0–3.0)
Basophils Absolute: 0.1 10*3/uL (ref 0.0–0.1)
EOS ABS: 0.2 10*3/uL (ref 0.0–0.7)
Eosinophils Relative: 3.2 % (ref 0.0–5.0)
HCT: 41.8 % (ref 39.0–52.0)
Hemoglobin: 14.2 g/dL (ref 13.0–17.0)
Lymphocytes Relative: 20.7 % (ref 12.0–46.0)
Lymphs Abs: 1.2 10*3/uL (ref 0.7–4.0)
MCHC: 34 g/dL (ref 30.0–36.0)
MCV: 91.9 fl (ref 78.0–100.0)
MONO ABS: 0.6 10*3/uL (ref 0.1–1.0)
Monocytes Relative: 11.3 % (ref 3.0–12.0)
NEUTROS ABS: 3.7 10*3/uL (ref 1.4–7.7)
Neutrophils Relative %: 63.9 % (ref 43.0–77.0)
PLATELETS: 245 10*3/uL (ref 150.0–400.0)
RBC: 4.54 Mil/uL (ref 4.22–5.81)
RDW: 13.1 % (ref 11.5–15.5)
WBC: 5.7 10*3/uL (ref 4.0–10.5)

## 2016-03-15 LAB — COMPREHENSIVE METABOLIC PANEL
ALT: 18 U/L (ref 0–53)
AST: 19 U/L (ref 0–37)
Albumin: 4 g/dL (ref 3.5–5.2)
Alkaline Phosphatase: 61 U/L (ref 39–117)
BUN: 13 mg/dL (ref 6–23)
CALCIUM: 9 mg/dL (ref 8.4–10.5)
CHLORIDE: 103 meq/L (ref 96–112)
CO2: 30 mEq/L (ref 19–32)
Creatinine, Ser: 1.16 mg/dL (ref 0.40–1.50)
GFR: 63.48 mL/min (ref >60.00)
Glucose, Bld: 107 mg/dL — ABNORMAL HIGH (ref 70–99)
POTASSIUM: 4.4 meq/L (ref 3.5–5.1)
SODIUM: 138 meq/L (ref 135–145)
Total Bilirubin: 0.6 mg/dL (ref 0.2–1.2)
Total Protein: 6.6 g/dL (ref 6.0–8.3)

## 2016-03-15 LAB — LIPID PANEL
Cholesterol: 142 mg/dL (ref 0–200)
HDL: 42.6 mg/dL (ref >39.00)
LDL Cholesterol: 79 mg/dL (ref 0–99)
NonHDL: 99.21
TRIGLYCERIDES: 103 mg/dL (ref 0.0–149.0)
Total CHOL/HDL Ratio: 3
VLDL: 20.6 mg/dL (ref 0.0–40.0)

## 2016-03-19 ENCOUNTER — Telehealth: Payer: Self-pay | Admitting: Family Medicine

## 2016-03-19 DIAGNOSIS — E785 Hyperlipidemia, unspecified: Secondary | ICD-10-CM

## 2016-03-19 NOTE — Telephone Encounter (Signed)
Lipids prior to visit in about 6 months, if okay with patient.  Ordered.  Thanks.

## 2016-03-19 NOTE — Telephone Encounter (Signed)
Patient received his lab results.  Patient was told Dr.Duncan would check his cholesterol periodically.  Patient wants to know when he should schedule his next appointment.

## 2016-03-19 NOTE — Telephone Encounter (Signed)
Patient aware.

## 2016-04-05 ENCOUNTER — Encounter: Payer: Self-pay | Admitting: Internal Medicine

## 2016-04-05 ENCOUNTER — Ambulatory Visit (INDEPENDENT_AMBULATORY_CARE_PROVIDER_SITE_OTHER): Payer: Medicare Other | Admitting: Internal Medicine

## 2016-04-05 VITALS — BP 122/88 | HR 61 | Temp 98.0°F | Wt 179.0 lb

## 2016-04-05 DIAGNOSIS — L309 Dermatitis, unspecified: Secondary | ICD-10-CM

## 2016-04-05 DIAGNOSIS — I2510 Atherosclerotic heart disease of native coronary artery without angina pectoris: Secondary | ICD-10-CM | POA: Diagnosis not present

## 2016-04-05 MED ORDER — KETOCONAZOLE 2 % EX CREA: 1.0000 "application " | TOPICAL_CREAM | Freq: Two times a day (BID) | CUTANEOUS | 1 refills | Status: DC

## 2016-04-05 NOTE — Assessment & Plan Note (Signed)
Not classic appearance but location suggests fungal Will try ketoconazole cream

## 2016-04-05 NOTE — Progress Notes (Signed)
Pre visit review using our clinic review tool, if applicable. No additional management support is needed unless otherwise documented below in the visit note. 

## 2016-04-05 NOTE — Progress Notes (Signed)
Subjective:    Patient ID: Jason Velazquez, male    DOB: 10-24-1928, 83 y.o.   MRN: [id number]  HPI Here due to rash  5-6 days in both axillae Tried zinc oxide without help Same dial soap--- same stick deodorant  Also some in his crack of buttocks Occasional bleeding Some pain with the cream application there  Current Outpatient Prescriptions on File Prior to Visit  Medication Sig Dispense Refill  . atenolol (TENORMIN) 25 MG tablet Take 25 mg by mouth daily after breakfast.    . atorvastatin (LIPITOR) 20 MG tablet Take 20 mg by mouth at bedtime.    . clopidogrel (PLAVIX) 75 MG tablet Take 75 mg by mouth daily.    Marland Kitchen docusate sodium (COLACE) 100 MG capsule Take 200 mg by mouth 2 (two) times daily.     . nitroGLYCERIN (NITROSTAT) 0.4 MG SL tablet Place 0.4 mg under the tongue every 5 (five) minutes as needed for chest pain.     Marland Kitchen oxymetazoline (AFRIN) 0.05 % nasal spray Place 1 spray into both nostrils daily.    . pantoprazole (PROTONIX) 40 MG tablet Take 40 mg by mouth daily.    . polyethylene glycol (MIRALAX / GLYCOLAX) packet Take 17 g by mouth daily.     Current Facility-Administered Medications on File Prior to Visit  Medication Dose Route Frequency Provider Last Rate Last Dose  . betamethasone acetate-betamethasone sodium phosphate (CELESTONE) injection 12 mg  12 mg Intramuscular Once Edrick Kins, DPM      . betamethasone acetate-betamethasone sodium phosphate (CELESTONE) injection 3 mg  3 mg Intramuscular Once Edrick Kins, DPM        Allergies  Allergen Reactions  . Iodine Rash    Rash when applied to skin  . Niacin And Related Hives and Other (See Comments)    FLUSHING  . Adhesive [Tape] Rash    Past Medical History:  Diagnosis Date  . Allergic rhinitis   . Benign prostatic hypertrophy   . Coronary artery disease CARDIOLOGIST -- DR Daneen Schick  . Duodenal diverticulum 2003  . GERD (gastroesophageal reflux disease)   . H/O hiatal hernia 2003  . Heart murmur     . Hepatic lesion 2004  . History of melanoma excision    FACE  . Hypercholesteremia   . Hypertension   . Left carotid stenosis    > 50%  PER DUPLEX  03-28-2011  . Mild mitral regurgitation   . Perianal fistula   . S/P CABG x 4    1988  . S/P drug eluting coronary stent placement    POST CABG--  STENTING 2000  AND RESTENTING 2006 FOR REINSTENT STENOSIS  . Stroke [facility])     Past Surgical History:  Procedure Laterality Date  . CATARACT EXTRACTION W/ INTRAOCULAR LENS  IMPLANT, BILATERAL    . CORONARY ANGIOPLASTY WITH STENT PLACEMENT  2000   PCI AND STENTING CIRCUMFLEX  . CORONARY ANGIOPLASTY WITH STENT PLACEMENT  04-30-2004  DR Daneen Schick   RE-INSTENT STENOSIS/  DRUG-ELUTING STENT OF PROXIMAL AND MID CIRCUMFLEX/ PCI MID CIRCUMFLEX THROUGH STENT STRUT/  WIDELY PATENT SAPHENOUS VEIN GRAFT AND  LIMA TO LAD GRAFT/ TOTAL OCCLUSION RIGHT CORONARY BEYOND THE POSTERIOR DESCENDING ARTERY BRANCH WITH 50% OSTIAL NARROWING/ TOTAL OCCLUSION OF LAD AT THE OSTIUM OF THE LEFT MAIN  . CORONARY ARTERY BYPASS GRAFT  1988   X5  . EVALUATION UNDER ANESTHESIA WITH ANAL FISTULECTOMY N/A 07/22/2012   Procedure: EXAM UNDER ANESTHESIA WITH ANAL fistulotomy;  Surgeon:  Leighton Ruff, MD;  Location: [facility];  Service: General;  Laterality: N/A;  . SHOULDER ARTHROSCOPY WITH OPEN ROTATOR CUFF REPAIR Right 07-10-1999  . TONSILLECTOMY AND ADENOIDECTOMY  1944  . TRANSTHORACIC ECHOCARDIOGRAM  03-01-2011  DR Daneen Schick   NORMAL LVF AND LV SIZE/ MILD LEFT ATRIAL ENLARGEMENT/ GRADE II DIASTOLIC DYSFUNCTION WITH ELEVATED LEFT ATRIAL PRESSURE/ MILD TO MODERATE MR/ MILD TR  . TRANSURETHRAL RESECTION OF PROSTATE  04/09/2011   Procedure: TRANSURETHRAL RESECTION OF THE PROSTATE WITH GYRUS INSTRUMENTS;  Surgeon: Malka So, MD;  Location: [facility];  Service: Urology;  Laterality: N/A;    Family History  Problem Relation Age of Onset  . Breast cancer Mother   . Stroke Father   . Colon cancer Brother      Social History   Social History  . Marital status: Widowed    Spouse name: [name]  . Number of children: 3  . Years of education: B.D.   Occupational History  . retired    Social History Main Topics  . Smoking status: Never Smoker  . Smokeless tobacco: Never Used  . Alcohol use No  . Drug use: No  . Sexual activity: No   Other Topics Concern  . Not on file   Social History Narrative   Lives alone, at [facility].  Has some help with housecleaning but o/w he is independent.     Was married to first wife 71 years, then to second wife 12 years.  Widowed twice.        Likes to travel.     Retired Theme park manager, [employer]   One son was killed in [place] in 1984   One son and one daughter still alive in 2017   Review of Systems  No fever Feels well otherwise Walks regularly--- swims or walks 3 miles     Objective:   Physical Exam  Skin:  Slight redness along front part of left axilla Also redness in crack of buttocks with tiny ulcer--above the rectum          Assessment & Plan:

## 2016-05-09 DIAGNOSIS — L111 Transient acantholytic dermatosis [Grover]: Secondary | ICD-10-CM | POA: Diagnosis not present

## 2016-05-09 DIAGNOSIS — Z85828 Personal history of other malignant neoplasm of skin: Secondary | ICD-10-CM | POA: Diagnosis not present

## 2016-05-09 DIAGNOSIS — L570 Actinic keratosis: Secondary | ICD-10-CM | POA: Diagnosis not present

## 2016-05-09 DIAGNOSIS — Z85820 Personal history of malignant melanoma of skin: Secondary | ICD-10-CM | POA: Diagnosis not present

## 2016-05-09 DIAGNOSIS — L821 Other seborrheic keratosis: Secondary | ICD-10-CM | POA: Diagnosis not present

## 2016-05-09 DIAGNOSIS — D225 Melanocytic nevi of trunk: Secondary | ICD-10-CM | POA: Diagnosis not present

## 2016-05-09 DIAGNOSIS — L720 Epidermal cyst: Secondary | ICD-10-CM | POA: Diagnosis not present

## 2016-05-20 DIAGNOSIS — M25512 Pain in left shoulder: Secondary | ICD-10-CM | POA: Diagnosis not present

## 2016-06-12 ENCOUNTER — Ambulatory Visit: Payer: Medicare Other | Admitting: Cardiology

## 2016-06-13 ENCOUNTER — Encounter: Payer: Self-pay | Admitting: Cardiology

## 2016-06-17 DIAGNOSIS — M25512 Pain in left shoulder: Secondary | ICD-10-CM | POA: Diagnosis not present

## 2016-06-18 DIAGNOSIS — Z85828 Personal history of other malignant neoplasm of skin: Secondary | ICD-10-CM | POA: Diagnosis not present

## 2016-06-18 DIAGNOSIS — D485 Neoplasm of uncertain behavior of skin: Secondary | ICD-10-CM | POA: Diagnosis not present

## 2016-06-18 DIAGNOSIS — Z85820 Personal history of malignant melanoma of skin: Secondary | ICD-10-CM | POA: Diagnosis not present

## 2016-06-18 DIAGNOSIS — L570 Actinic keratosis: Secondary | ICD-10-CM | POA: Diagnosis not present

## 2016-06-26 ENCOUNTER — Encounter: Payer: Self-pay | Admitting: Cardiology

## 2016-06-26 ENCOUNTER — Ambulatory Visit (INDEPENDENT_AMBULATORY_CARE_PROVIDER_SITE_OTHER): Payer: Medicare Other | Admitting: Cardiology

## 2016-06-26 VITALS — BP 180/82 | HR 58 | Ht 64.0 in | Wt 179.8 lb

## 2016-06-26 DIAGNOSIS — I10 Essential (primary) hypertension: Secondary | ICD-10-CM | POA: Diagnosis not present

## 2016-06-26 DIAGNOSIS — Z951 Presence of aortocoronary bypass graft: Secondary | ICD-10-CM

## 2016-06-26 DIAGNOSIS — I2510 Atherosclerotic heart disease of native coronary artery without angina pectoris: Secondary | ICD-10-CM

## 2016-06-26 DIAGNOSIS — Z9861 Coronary angioplasty status: Secondary | ICD-10-CM | POA: Diagnosis not present

## 2016-06-26 MED ORDER — AMLODIPINE BESYLATE 5 MG PO TABS: 5.0000 mg | ORAL_TABLET | Freq: Every day | ORAL | 3 refills | Status: DC

## 2016-06-26 NOTE — Progress Notes (Signed)
Cardiology Office Note   Date:  06/26/2016   ID:  Jason Velazquez, DOB [birth year], MRN [id number]  Referring Doctor:  Tonia Ghent, MD   Cardiologist:   Wende Bushy, MD   Reason for consultation:  Chief Complaint  Patient presents with  . other    Over due 6 month follow up. patient denies chest pain and SOB. Meds reviewed verbally with patient.    History of CAD   History of Present Illness: Jason Velazquez is a 83 y.o. male who presents for Follow-up for CAD, hypertension  When asked about his hypertension, patient minutes to not routinely checking it at home. He mentions checking it a while back, last time it was elevated in the 160s. Patient denies chest pain and shortness of breath. He does admit that there likely is contribution from his diet as he is eating out most of the times.  Patient denies PND, orthopnea, edema. No chest pain or shortness of breath. No palpitations, no syncope.   ROS:  Please see the history of present illness. Aside from mentioned under HPI, all other systems are reviewed and negative.    Past Medical History:  Diagnosis Date  . Allergic rhinitis   . Benign prostatic hypertrophy   . Coronary artery disease CARDIOLOGIST -- DR Daneen Schick  . Duodenal diverticulum 2003  . GERD (gastroesophageal reflux disease)   . H/O hiatal hernia 2003  . Heart murmur   . Hepatic lesion 2004  . History of melanoma excision    FACE  . Hypercholesteremia   . Hypertension   . Left carotid stenosis    > 50%  PER DUPLEX  03-28-2011  . Mild mitral regurgitation   . Perianal fistula   . S/P CABG x 4    1988  . S/P drug eluting coronary stent placement    POST CABG--  STENTING 2000  AND RESTENTING 2006 FOR REINSTENT STENOSIS  . Stroke [facility])     Past Surgical History:  Procedure Laterality Date  . CATARACT EXTRACTION W/ INTRAOCULAR LENS  IMPLANT, BILATERAL    . CORONARY ANGIOPLASTY WITH STENT PLACEMENT  2000   PCI AND STENTING CIRCUMFLEX  . CORONARY  ANGIOPLASTY WITH STENT PLACEMENT  04-30-2004  DR Daneen Schick   RE-INSTENT STENOSIS/  DRUG-ELUTING STENT OF PROXIMAL AND MID CIRCUMFLEX/ PCI MID CIRCUMFLEX THROUGH STENT STRUT/  WIDELY PATENT SAPHENOUS VEIN GRAFT AND  LIMA TO LAD GRAFT/ TOTAL OCCLUSION RIGHT CORONARY BEYOND THE POSTERIOR DESCENDING ARTERY BRANCH WITH 50% OSTIAL NARROWING/ TOTAL OCCLUSION OF LAD AT THE OSTIUM OF THE LEFT MAIN  . CORONARY ARTERY BYPASS GRAFT  1988   X5  . EVALUATION UNDER ANESTHESIA WITH ANAL FISTULECTOMY N/A 07/22/2012   Procedure: EXAM UNDER ANESTHESIA WITH ANAL fistulotomy;  Surgeon: Leighton Ruff, MD;  Location: [place];  Service: General;  Laterality: N/A;  . SHOULDER ARTHROSCOPY WITH OPEN ROTATOR CUFF REPAIR Right 07-10-1999  . TONSILLECTOMY AND ADENOIDECTOMY  1944  . TRANSTHORACIC ECHOCARDIOGRAM  03-01-2011  DR Daneen Schick   NORMAL LVF AND LV SIZE/ MILD LEFT ATRIAL ENLARGEMENT/ GRADE II DIASTOLIC DYSFUNCTION WITH ELEVATED LEFT ATRIAL PRESSURE/ MILD TO MODERATE MR/ MILD TR  . TRANSURETHRAL RESECTION OF PROSTATE  04/09/2011   Procedure: TRANSURETHRAL RESECTION OF THE PROSTATE WITH GYRUS INSTRUMENTS;  Surgeon: Malka So, MD;  Location: [facility];  Service: Urology;  Laterality: N/A;     reports that he has never smoked. He has never used smokeless tobacco. He reports that he does not drink  alcohol or use drugs.   family history includes Breast cancer in his mother; Colon cancer in his brother; Stroke in his father.   Outpatient Medications Prior to Visit  Medication Sig Dispense Refill  . atenolol (TENORMIN) 25 MG tablet Take 25 mg by mouth daily after breakfast.    . atorvastatin (LIPITOR) 20 MG tablet Take 20 mg by mouth at bedtime.    . clopidogrel (PLAVIX) 75 MG tablet Take 75 mg by mouth daily.    Marland Kitchen docusate sodium (COLACE) 100 MG capsule Take 200 mg by mouth 2 (two) times daily.     Marland Kitchen ketoconazole (NIZORAL) 2 % cream Apply 1 application topically 2 (two) times daily. 60 g 1  .  nitroGLYCERIN (NITROSTAT) 0.4 MG SL tablet Place 0.4 mg under the tongue every 5 (five) minutes as needed for chest pain.     Marland Kitchen oxymetazoline (AFRIN) 0.05 % nasal spray Place 1 spray into both nostrils daily.    . pantoprazole (PROTONIX) 40 MG tablet Take 40 mg by mouth daily.    . polyethylene glycol (MIRALAX / GLYCOLAX) packet Take 17 g by mouth daily.     Facility-Administered Medications Prior to Visit  Medication Dose Route Frequency Provider Last Rate Last Dose  . betamethasone acetate-betamethasone sodium phosphate (CELESTONE) injection 12 mg  12 mg Intramuscular Once Daylene Katayama M, DPM      . betamethasone acetate-betamethasone sodium phosphate (CELESTONE) injection 3 mg  3 mg Intramuscular Once Edrick Kins, DPM         Allergies: Iodine; Niacin and related; and Adhesive [tape]    PHYSICAL EXAM: VS:  BP (!) 180/82 (BP Location: Left Arm, Patient Position: Sitting, Cuff Size: Normal)   Pulse (!) 58   Ht 5\' 4"  (1.626 m)   Wt 179 lb 12 oz (81.5 kg)   BMI 30.85 kg/m  , Body mass index is 30.85 kg/m. Wt Readings from Last 3 Encounters:  06/26/16 179 lb 12 oz (81.5 kg)  04/05/16 179 lb (81.2 kg)  03/14/16 182 lb 8 oz (82.8 kg)    GENERAL:  well developed, well nourished, obese, not in acute distress HEENT: normocephalic, pink conjunctivae, anicteric sclerae, no xanthelasma, normal dentition, oropharynx clear NECK:  no neck vein engorgement, JVP normal, no hepatojugular reflux, carotid upstroke brisk and symmetric, no bruit, no thyromegaly, no lymphadenopathy LUNGS:  good respiratory effort, clear to auscultation bilaterally CV:  PMI not displaced, no thrills, no lifts, S1 and S2 within normal limits, no palpable S3 or S4, no murmurs, no rubs, no gallops ABD:  Soft, nontender, nondistended, normoactive bowel sounds, no abdominal aortic bruit, no hepatomegaly, no splenomegaly MS: nontender back, no kyphosis, no scoliosis, no joint deformities EXT:  2+ DP/PT pulses, no edema, no  varicosities, no cyanosis, no clubbing SKIN: warm, nondiaphoretic, normal turgor, no ulcers NEUROPSYCH: alert, oriented to person, place, and time, sensory/motor grossly intact, normal mood, appropriate affect    Recent Labs: 03/15/2016: ALT 18; BUN 13; Creatinine, Ser 1.16; Hemoglobin 14.2; Platelets 245.0; Potassium 4.4; Sodium 138   Lipid Panel    Component Value Date/Time   CHOL 142 03/15/2016 0753   CHOL 138 12/16/2012 0837   TRIG 103.0 03/15/2016 0753   HDL 42.60 03/15/2016 0753   HDL 46 12/16/2012 0837   CHOLHDL 3 03/15/2016 0753   VLDL 20.6 03/15/2016 0753   LDLCALC 79 03/15/2016 0753   LDLCALC 75 12/16/2012 0837     Other studies Reviewed:  EKG:  The ekg from 11/07/2015 was personally reviewed  by me and it revealed sinus rhythm, 60 BPM.  Additional studies/ records that were reviewed personally reviewed by me today include: Echo 05/27/2013: Left ventricle: The cavity size was normal. Wall thickness was increased in a pattern of mild LVH. Systolic function was normal. The estimated ejection fraction was in the range of 50% to 55%. Wall motion was normal; there were no regional wall motion abnormalities. Doppler parameters are consistent with abnormal left ventricular relaxation (grade 1 diastolic dysfunction). - Left atrium: The atrium was mildly dilated. - Right atrium: The atrium was mildly dilated.   ASSESSMENT AND PLAN: CAD status post CABG, PCI No chest pain or shortness of breath. Continue medical therapy. Patient is on Plavix for history of stroke. He carries his nitroglycerin sublingual with him all the time. He has not needed this and a very long time. Continue risk factor modification.  Hypertension We'll add amlodipine 5 mg by mouth daily, may uptitrate to 10 mg by mouth daily if blood pressure is not goal less than 140/80. Continue atenolol 25 daily. Heart rate is relatively on the slower side the patient is  asymptomatic.  Hyperlipidemia PCP following labs. LDL goal is less than 70. Continue statin therapy, dietary modification recommended. Patient verbalized understanding.   Current medicines are reviewed at length with the patient today.  The patient does not have concerns regarding medicines.  Labs/ tests ordered today include:  Orders Placed This Encounter  Procedures  . EKG 12-Lead    I had a lengthy and detailed discussion with the patient regarding diagnoses, prognosis, diagnostic options, treatment options , and side effects of medications.   I counseled the patient on importance of lifestyle modification including heart healthy diet, regular physical activity .   Disposition:   FU with Cardiology in 3 months or sooner   Signed, Wende Bushy, MD  06/26/2016 5:20 PM    Pump [place]  This note was generated in part with voice recognition software and I apologize for any typographical errors that were not detected and corrected.

## 2016-06-26 NOTE — Patient Instructions (Addendum)
Medication Instructions:  Your physician has recommended you make the following change in your medication:  1. START Amlodipine 5 mg once daily  Your physician has requested that you regularly monitor and record your blood pressure readings at home. Please use the same machine at the same time of day to check your readings and record them to bring to your follow-up visit.  Please monitor blood pressure. Ideally, it should be less than 140/80. Please give [place] a call in one to 2 weeks with her blood pressure measurements.  Follow-Up: Your physician recommends that you schedule a follow-up appointment in: 3 months.   It was a pleasure seeing you today here in the office. Please do not hesitate to give [place] a call back if you have any further questions. [place], BSN

## 2016-08-05 DIAGNOSIS — I10 Essential (primary) hypertension: Secondary | ICD-10-CM | POA: Diagnosis not present

## 2016-08-05 DIAGNOSIS — I2510 Atherosclerotic heart disease of native coronary artery without angina pectoris: Secondary | ICD-10-CM | POA: Diagnosis not present

## 2016-08-27 DIAGNOSIS — H90A31 Mixed conductive and sensorineural hearing loss, unilateral, right ear with restricted hearing on the contralateral side: Secondary | ICD-10-CM | POA: Diagnosis not present

## 2016-09-16 ENCOUNTER — Ambulatory Visit (INDEPENDENT_AMBULATORY_CARE_PROVIDER_SITE_OTHER): Payer: Medicare Other | Admitting: Family Medicine

## 2016-09-16 ENCOUNTER — Encounter: Payer: Self-pay | Admitting: Family Medicine

## 2016-09-16 DIAGNOSIS — I10 Essential (primary) hypertension: Secondary | ICD-10-CM

## 2016-09-16 DIAGNOSIS — I2510 Atherosclerotic heart disease of native coronary artery without angina pectoris: Secondary | ICD-10-CM | POA: Diagnosis not present

## 2016-09-16 NOTE — Patient Instructions (Addendum)
I would get a flu shot each fall.   Check with your insurance to see if they will cover the shingrix shot. Don't change your meds for now.  Update me as needed.  Reasonable to recheck in about 6 months with labs prior to a physical.

## 2016-09-16 NOTE — Progress Notes (Signed)
He moved to [facility].  He is trying to adjust to the change as a widower.  He is exercising, going to church.  He is to travel out [place] with his daughter in the fall of this year, looking forward to the trip.    Flu shot encouraged.  Shingles d/w pt.  See AVS.   Hypertension:    Using medication without problems or lightheadedness: yes Chest pain with exertion:no Edema:no Short of breath:no  He had SOM per his report but recheck TMs today wnl.  No ear pain or trouble hearing.  Using hearing aids at baseline.   PMH and SH reviewed  ROS: Per HPI unless specifically indicated in ROS section   Meds, vitals, and allergies reviewed.   GEN: nad, alert and oriented HEENT: mucous membranes moist, TM recheck B without SOM or erythema.   NECK: supple w/o LA CV: rrr PULM: ctab, no inc wob ABD: soft, +bs EXT: no edema SKIN: no acute rash

## 2016-09-17 NOTE — Assessment & Plan Note (Signed)
Reasonable control, continue as is.  No change in meds.  He agrees.  Is exercising, working on diet.  He is trying to adjust to life as a widower.  Recheck in about 6 months.  He agrees.

## 2016-10-09 DIAGNOSIS — I2510 Atherosclerotic heart disease of native coronary artery without angina pectoris: Secondary | ICD-10-CM | POA: Diagnosis not present

## 2016-10-09 DIAGNOSIS — I10 Essential (primary) hypertension: Secondary | ICD-10-CM | POA: Diagnosis not present

## 2016-10-18 DIAGNOSIS — I10 Essential (primary) hypertension: Secondary | ICD-10-CM | POA: Diagnosis not present

## 2016-10-18 DIAGNOSIS — Z6830 Body mass index (BMI) 30.0-30.9, adult: Secondary | ICD-10-CM | POA: Diagnosis not present

## 2016-10-18 DIAGNOSIS — E669 Obesity, unspecified: Secondary | ICD-10-CM | POA: Diagnosis not present

## 2016-10-18 DIAGNOSIS — Z23 Encounter for immunization: Secondary | ICD-10-CM | POA: Diagnosis not present

## 2016-11-12 DIAGNOSIS — L570 Actinic keratosis: Secondary | ICD-10-CM | POA: Diagnosis not present

## 2016-11-12 DIAGNOSIS — Z85820 Personal history of malignant melanoma of skin: Secondary | ICD-10-CM | POA: Diagnosis not present

## 2016-11-12 DIAGNOSIS — D1801 Hemangioma of skin and subcutaneous tissue: Secondary | ICD-10-CM | POA: Diagnosis not present

## 2016-11-12 DIAGNOSIS — Z85828 Personal history of other malignant neoplasm of skin: Secondary | ICD-10-CM | POA: Diagnosis not present

## 2016-11-12 DIAGNOSIS — L821 Other seborrheic keratosis: Secondary | ICD-10-CM | POA: Diagnosis not present

## 2016-11-25 NOTE — Telephone Encounter (Signed)
Close Encounter 

## 2017-01-01 DIAGNOSIS — Z1389 Encounter for screening for other disorder: Secondary | ICD-10-CM | POA: Diagnosis not present

## 2017-01-01 DIAGNOSIS — Z6830 Body mass index (BMI) 30.0-30.9, adult: Secondary | ICD-10-CM | POA: Diagnosis not present

## 2017-01-01 DIAGNOSIS — Z23 Encounter for immunization: Secondary | ICD-10-CM | POA: Diagnosis not present

## 2017-01-01 DIAGNOSIS — E669 Obesity, unspecified: Secondary | ICD-10-CM | POA: Diagnosis not present

## 2017-01-01 DIAGNOSIS — Z0000 Encounter for general adult medical examination without abnormal findings: Secondary | ICD-10-CM | POA: Diagnosis not present

## 2017-01-01 DIAGNOSIS — I10 Essential (primary) hypertension: Secondary | ICD-10-CM | POA: Diagnosis not present

## 2017-01-01 DIAGNOSIS — M4692 Unspecified inflammatory spondylopathy, cervical region: Secondary | ICD-10-CM | POA: Diagnosis not present

## 2017-01-23 ENCOUNTER — Telehealth: Payer: Self-pay | Admitting: Family Medicine

## 2017-01-23 NOTE — Telephone Encounter (Signed)
Jason Velazquez called wanting to know if he should continue to take his Norvasc. I did not see if had been discontinued so I advised him to continue to take his Norvasc. Pt voiced understanding and he would continue to take it as prescribed.

## 2017-01-24 NOTE — Telephone Encounter (Addendum)
Reasonable to have him check his BP a few times  and if consistently >160/>90 or if he is lightheaded then update [place].  Thanks.

## 2017-01-24 NOTE — Telephone Encounter (Signed)
Left detailed message on voicemail.  

## 2017-02-18 ENCOUNTER — Encounter: Payer: Self-pay | Admitting: Family Medicine

## 2017-02-18 ENCOUNTER — Ambulatory Visit (INDEPENDENT_AMBULATORY_CARE_PROVIDER_SITE_OTHER): Payer: Medicare Other | Admitting: Family Medicine

## 2017-02-18 ENCOUNTER — Ambulatory Visit (INDEPENDENT_AMBULATORY_CARE_PROVIDER_SITE_OTHER): Payer: Medicare Other

## 2017-02-18 VITALS — BP 138/80 | HR 61 | Temp 97.6°F | Wt 180.0 lb

## 2017-02-18 VITALS — BP 130/80 | HR 61 | Temp 97.6°F | Ht 64.25 in | Wt 180.0 lb

## 2017-02-18 DIAGNOSIS — Z0000 Encounter for general adult medical examination without abnormal findings: Secondary | ICD-10-CM

## 2017-02-18 DIAGNOSIS — I10 Essential (primary) hypertension: Secondary | ICD-10-CM

## 2017-02-18 LAB — CBC WITH DIFFERENTIAL/PLATELET
Basophils Absolute: 0.1 10*3/uL (ref 0.0–0.1)
Basophils Relative: 0.9 % (ref 0.0–3.0)
EOS PCT: 1.5 % (ref 0.0–5.0)
Eosinophils Absolute: 0.1 10*3/uL (ref 0.0–0.7)
HEMATOCRIT: 44.3 % (ref 39.0–52.0)
HEMOGLOBIN: 14.9 g/dL (ref 13.0–17.0)
LYMPHS PCT: 19.3 % (ref 12.0–46.0)
Lymphs Abs: 1.3 10*3/uL (ref 0.7–4.0)
MCHC: 33.5 g/dL (ref 30.0–36.0)
MCV: 92.4 fl (ref 78.0–100.0)
MONOS PCT: 12.4 % — AB (ref 3.0–12.0)
Monocytes Absolute: 0.9 10*3/uL (ref 0.1–1.0)
Neutro Abs: 4.6 10*3/uL (ref 1.4–7.7)
Neutrophils Relative %: 65.9 % (ref 43.0–77.0)
Platelets: 271 10*3/uL (ref 150.0–400.0)
RBC: 4.79 Mil/uL (ref 4.22–5.81)
RDW: 13.3 % (ref 11.5–15.5)
WBC: 7 10*3/uL (ref 4.0–10.5)

## 2017-02-18 LAB — COMPREHENSIVE METABOLIC PANEL
ALT: 19 U/L (ref 0–53)
AST: 20 U/L (ref 0–37)
Albumin: 4.2 g/dL (ref 3.5–5.2)
Alkaline Phosphatase: 73 U/L (ref 39–117)
BUN: 11 mg/dL (ref 6–23)
CALCIUM: 9.2 mg/dL (ref 8.4–10.5)
CO2: 30 meq/L (ref 19–32)
Chloride: 99 mEq/L (ref 96–112)
Creatinine, Ser: 1 mg/dL (ref 0.40–1.50)
GFR: 75.17 mL/min (ref >60.00)
Glucose, Bld: 102 mg/dL — ABNORMAL HIGH (ref 70–99)
POTASSIUM: 4.6 meq/L (ref 3.5–5.1)
Sodium: 134 mEq/L — ABNORMAL LOW (ref 135–145)
Total Bilirubin: 0.8 mg/dL (ref 0.2–1.2)
Total Protein: 6.7 g/dL (ref 6.0–8.3)

## 2017-02-18 LAB — LIPID PANEL
CHOL/HDL RATIO: 4
Cholesterol: 150 mg/dL (ref 0–200)
HDL: 41.5 mg/dL (ref >39.00)
LDL Cholesterol: 75 mg/dL (ref 0–99)
NONHDL: 108.57
Triglycerides: 170 mg/dL — ABNORMAL HIGH (ref 0.0–149.0)
VLDL: 34 mg/dL (ref 0.0–40.0)

## 2017-02-18 NOTE — Progress Notes (Signed)
Subjective:   Jason Velazquez is a 84 y.o. male who presents for an Initial Medicare Annual Wellness Visit.  Review of Systems  N/A Cardiac Risk Factors include: advanced age (>37men, >45 women);male gender;obesity (BMI >30kg/m2);dyslipidemia;hypertension    Objective:    Today's Vitals   02/18/17 1201 02/18/17 1203  BP: 130/80   Pulse: 61   Temp: 97.6 F (36.4 C)   TempSrc: Oral   SpO2: 98%   Weight: 180 lb (81.6 kg)   Height: 5' 4.25" (1.632 m)   PainSc: 0-No pain 0-No pain   Body mass index is 30.66 kg/m.  Advanced Directives 02/18/2017 05/26/2013 07/22/2012 04/09/2011 04/03/2011  Does Patient Have a Medical Advance Directive? Yes Patient has advance directive, copy not in chart Patient has advance directive, copy not in chart Patient has advance directive, copy in chart Patient has advance directive, copy in chart  Type of Advance Directive [employer];Living will Living will;Healthcare Power of [place]  Does patient want to make changes to medical advance directive? - No change requested - - -  Copy of [place] in Chart? No - copy requested - Copy requested from family - -  Pre-existing out of facility DNR order (yellow form or pink MOST form) - - No No No    Current Medications (verified) Outpatient Encounter Medications as of 02/18/2017  Medication Sig  . atenolol (TENORMIN) 25 MG tablet Take 25 mg by mouth daily after breakfast.  . atorvastatin (LIPITOR) 20 MG tablet Take 20 mg by mouth at bedtime.  . clopidogrel (PLAVIX) 75 MG tablet Take 75 mg by mouth daily.  Marland Kitchen docusate sodium (COLACE) 100 MG capsule Take 200 mg by mouth 2 (two) times daily.   . nitroGLYCERIN (NITROSTAT) 0.4 MG SL tablet Place 0.4 mg under the tongue every 5 (five) minutes as needed for chest pain.   Marland Kitchen oxymetazoline (AFRIN) 0.05 % nasal spray Place 1 spray into both nostrils daily.   . pantoprazole (PROTONIX) 40 MG tablet Take 40 mg by mouth daily.  . polyethylene glycol (MIRALAX / GLYCOLAX) packet Take 17 g by mouth daily.  Marland Kitchen amLODipine (NORVASC) 5 MG tablet Take 1 tablet (5 mg total) by mouth daily.  Marland Kitchen ketoconazole (NIZORAL) 2 % cream Apply 1 application topically 2 (two) times daily. (Patient not taking: Reported on 02/18/2017)   Facility-Administered Encounter Medications as of 02/18/2017  Medication  . betamethasone acetate-betamethasone sodium phosphate (CELESTONE) injection 12 mg  . betamethasone acetate-betamethasone sodium phosphate (CELESTONE) injection 3 mg    Allergies (verified) Iodine; Niacin and related; and Adhesive [tape]   History: Past Medical History:  Diagnosis Date  . Allergic rhinitis   . Benign prostatic hypertrophy   . Coronary artery disease CARDIOLOGIST -- DR Daneen Schick  . Duodenal diverticulum 2003  . GERD (gastroesophageal reflux disease)   . H/O hiatal hernia 2003  . Heart murmur   . Hepatic lesion 2004  . History of melanoma excision    FACE  . Hypercholesteremia   . Hypertension   . Left carotid stenosis    > 50%  PER DUPLEX  03-28-2011  . Mild mitral regurgitation   . Perianal fistula   . S/P CABG x 4    1988  . S/P drug eluting coronary stent placement    POST CABG--  STENTING 2000  AND RESTENTING 2006 FOR REINSTENT STENOSIS  . Stroke [facility])    Past Surgical History:  Procedure Laterality Date  . CATARACT EXTRACTION W/ INTRAOCULAR LENS  IMPLANT, BILATERAL    . CORONARY ANGIOPLASTY WITH STENT PLACEMENT  2000   PCI AND STENTING CIRCUMFLEX  . CORONARY ANGIOPLASTY WITH STENT PLACEMENT  04-30-2004  DR Daneen Schick   RE-INSTENT STENOSIS/  DRUG-ELUTING STENT OF PROXIMAL AND MID CIRCUMFLEX/ PCI MID CIRCUMFLEX THROUGH STENT STRUT/  WIDELY PATENT SAPHENOUS VEIN GRAFT AND  LIMA TO LAD GRAFT/ TOTAL OCCLUSION RIGHT CORONARY BEYOND THE POSTERIOR DESCENDING ARTERY BRANCH WITH 50% OSTIAL NARROWING/ TOTAL OCCLUSION OF LAD AT THE OSTIUM  OF THE LEFT MAIN  . CORONARY ARTERY BYPASS GRAFT  1988   X5  . EVALUATION UNDER ANESTHESIA WITH ANAL FISTULECTOMY N/A 07/22/2012   Procedure: EXAM UNDER ANESTHESIA WITH ANAL fistulotomy;  Surgeon: Leighton Ruff, MD;  Location: [place];  Service: General;  Laterality: N/A;  . SHOULDER ARTHROSCOPY WITH OPEN ROTATOR CUFF REPAIR Right 07-10-1999  . TONSILLECTOMY AND ADENOIDECTOMY  1944  . TRANSTHORACIC ECHOCARDIOGRAM  03-01-2011  DR Daneen Schick   NORMAL LVF AND LV SIZE/ MILD LEFT ATRIAL ENLARGEMENT/ GRADE II DIASTOLIC DYSFUNCTION WITH ELEVATED LEFT ATRIAL PRESSURE/ MILD TO MODERATE MR/ MILD TR  . TRANSURETHRAL RESECTION OF PROSTATE  04/09/2011   Procedure: TRANSURETHRAL RESECTION OF THE PROSTATE WITH GYRUS INSTRUMENTS;  Surgeon: Malka So, MD;  [facility];  Service: Urology;  Laterality: N/A;   Family History  Problem Relation Age of Onset  . Breast cancer Mother   . Stroke Father   . Colon cancer Brother    Social History   Socioeconomic History  . Marital status: Widowed    Spouse name: [name]  . Number of children: 3  . Years of education: B.D.  . Highest education level: None  Social Needs  . Financial resource strain: None  . Food insecurity - worry: None  . Food insecurity - inability: None  . Transportation needs - medical: None  . Transportation needs - non-medical: None  Occupational History  . Occupation: retired  Tobacco Use  . Smoking status: Never Smoker  . Smokeless tobacco: Never Used  Substance and Sexual Activity  . Alcohol use: No  . Drug use: No  . Sexual activity: No  Other Topics Concern  . None  Social History Narrative   Lives alone, at [facility].  Has some help with housecleaning but o/w he is independent.     Was married to first wife 77 years, then to second wife 12 years.  Widowed twice.        Likes to travel.     Retired Theme park manager, [employer]   One son was killed in [place] in 1984   One son and one  daughter still alive in 2018   Tobacco Counseling Counseling given: No   Clinical Intake:  Pre-visit preparation completed: Yes  Pain : No/denies pain Pain Score: 0-No pain     Nutritional Status: BMI > 30  Obese Nutritional Risks: None Diabetes: No  How often do you need to have someone help you when you read instructions, pamphlets, or other written materials from your doctor or pharmacy?: 1 - Never What is the last grade level you completed in school?: Bachelor degree and seminary school  Interpreter Needed?: No  Comments: pt is a widower and lives at [place] 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 Y  Vision? N  Difficulty concentrating or making decisions? N  Walking or climbing stairs? N  Dressing or bathing? N  Doing errands, shopping? N  Preparing Food and eating ? N  Using the Toilet? N  In the past six months, have you accidently leaked urine? N  Do you have problems with loss of bowel control? N  Managing your Medications? N  Managing your Finances? N  Housekeeping or managing your Housekeeping? N  Some recent data might be hidden     Immunizations and Health Maintenance Immunization History  Administered Date(s) Administered  . Influenza, High Dose Seasonal PF 11/02/2015  . Influenza-Unspecified 11/11/2016  . Pneumococcal Conjugate-13 03/14/2014  . Pneumococcal Polysaccharide-23 02/11/2013  . Zoster 02/11/2013   There are no preventive care reminders to display for this patient.  Patient Care Team: Tonia Ghent, MD as PCP - General (Family Medicine) Leandrew Koyanagi, MD as Referring Physician (Ophthalmology)  Indicate any recent Medical Services you may have received from other than [facility] providers in the past year (date may be approximate).    Assessment:   This is a routine wellness  examination for Ghazi.  Hearing/Vision screen Hearing Screening Comments: Bilateral hearing aids Vision Screening Comments: Last vision exam in Jan 2018; Future appt scheduled with Dr. Wallace Going  Dietary issues and exercise activities discussed: Current Exercise Habits: Home exercise routine, Type of exercise: Other - see comments;walking(climbing stairs), Time (Minutes): 60, Frequency (Times/Week): 6, Weekly Exercise (Minutes/Week): 360, Intensity: Moderate, Exercise limited by: None identified  Goals    . Increase physical activity     Starting 02/18/2017, I will continue to exercise for at least 60 minutes 6 days per week.       Depression Screen PHQ 2/9 Scores 02/18/2017 11/02/2015  PHQ - 2 Score 0 1  PHQ- 9 Score 0 -    Fall Risk Fall Risk  02/18/2017 11/02/2015  Falls in the past year? No Yes    Cognitive Function: MMSE - Mini Mental State Exam 02/18/2017  Orientation to time 5  Orientation to Place 5  Registration 3  Attention/ Calculation 0  Recall 3  Language- name 2 objects 0  Language- repeat 1  Language- follow 3 step command 3  Language- read & follow direction 0  Write a sentence 0  Copy design 0  Total score 20     PLEASE NOTE: A Mini-Cog screen was completed. Maximum score is 20. A value of 0 denotes this part of [place] MMSE was not completed or the patient failed this part of the Mini-Cog screening.   Mini-Cog Screening Orientation to Time - Max 5 pts Orientation to Place - Max 5 pts Registration - Max 3 pts Recall - Max 3 pts Language Repeat - Max 1 pts Language Follow 3 Step Command - Max 3 pts     Screening Tests Health Maintenance  Topic Date Due  . TETANUS/TDAP  02/18/2018 (Originally 05/28/1947)  . INFLUENZA VACCINE  Completed  . PNA vac Low Risk Adult  Completed       Plan:     I have personally reviewed, addressed, and noted the following in the patient's chart:  A. Medical and social history B. Use of alcohol, tobacco or illicit  drugs  C. Current medications and supplements D. Functional ability and status E.  Nutritional status F.  Physical activity G. Advance directives H. List of other physicians I.  Hospitalizations, surgeries, and ER visits in previous 12 months J.  Vitals K. Screenings to include hearing, vision, cognitive, depression  L. Referrals and appointments - none  In addition, I have reviewed and discussed with patient certain preventive protocols, quality metrics, and best practice recommendations. A written personalized care plan for preventive services as well as general preventive health recommendations were provided to patient.  See attached scanned questionnaire for additional information.   Signed,   Lindell Noe, MHA, BS, LPN Health Coach

## 2017-02-18 NOTE — Progress Notes (Signed)
PCP notes:   Health maintenance:  Tetanus vaccine - postponed/insurance  Abnormal screenings:   None  Patient concerns:   None  Nurse concerns:  None  Next PCP appt:   08/19/17 @ 0930  I reviewed health advisor's note, was available for consultation on the day of service listed in this note, and agree with documentation and plan. Elsie Stain, MD.

## 2017-02-18 NOTE — Progress Notes (Signed)
Hypertension:    Using medication without problems or lightheadedness: yes Chest pain with exertion:no Edema:some occ B ankle edema.   Short of breath:no He exercises w/o troubles at [place].   He is active at [employer], with frequent exercise.  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  He is still lonely after his wife died, trying to adjust to that.    He had to use afrin at night and did well with that, with less URI sx over the years.  Used only at night.  Cautioned not to use more then QD.    Meds, vitals, and allergies reviewed.   PMH and SH reviewed  ROS: Per HPI unless specifically indicated in ROS section   GEN: nad, alert and oriented HEENT: mucous membranes moist NECK: supple w/o LA CV: rrr. PULM: ctab, no inc wob ABD: soft, +bs EXT: no edema SKIN: no acute rash

## 2017-02-18 NOTE — Progress Notes (Signed)
Pre visit review using our clinic review tool, if applicable. No additional management support is needed unless otherwise documented below in the visit note. 

## 2017-02-18 NOTE — Patient Instructions (Signed)
Go to the lab on the way out.  We'll contact you with your lab report. Don't change your meds for now.  Take care.  Glad to see you.  Update me as needed.   

## 2017-02-18 NOTE — Patient Instructions (Signed)
Mr. Po , Thank you for taking time to come for your Medicare Wellness Visit. I appreciate your ongoing commitment to your health goals. Please review the following plan we discussed and let me know if I can assist you in the future.   These are the goals we discussed: Goals    . Increase physical activity     Starting 02/18/2017, I will continue to exercise for at least 60 minutes 6 days per week.        This is a list of the screening recommended for you and due dates:  Health Maintenance  Topic Date Due  . Tetanus Vaccine  02/18/2018*  . Flu Shot  Completed  . Pneumonia vaccines  Completed  *Topic was postponed. The date shown is not the original due date.   Preventive Care for Adults  A healthy lifestyle and preventive care can promote health and wellness. Preventive health guidelines for adults include the following key practices.  . A routine yearly physical is a good way to check with your health care provider about your health and preventive screening. It is a chance to share any concerns and updates on your health and to receive a thorough exam.  . Visit your dentist for a routine exam and preventive care every 6 months. Brush your teeth twice a day and floss once a day. Good oral hygiene prevents tooth decay and gum disease.  . The frequency of eye exams is based on your age, health, family medical history, use  of contact lenses, and other factors. Follow your health care provider's recommendations for frequency of eye exams.  . Eat a healthy diet. Foods like vegetables, fruits, whole grains, low-fat dairy products, and lean protein foods contain the nutrients you need without too many calories. Decrease your intake of foods high in solid fats, added sugars, and salt. Eat the right amount of calories for you. Get information about a proper diet from your health care provider, if necessary.  . Regular physical exercise is one of the most important things you can do for  your health. Most adults should get at least 150 minutes of moderate-intensity exercise (any activity that increases your heart rate and causes you to sweat) each week. In addition, most adults need muscle-strengthening exercises on 2 or more days a week.  Silver Sneakers may be a benefit available to you. To determine eligibility, you may visit the website: www.silversneakers.com or contact program at 615-328-5351 Mon-Fri between 8AM-8PM.   . Maintain a healthy weight. The body mass index (BMI) is a screening tool to identify possible weight problems. It provides an estimate of body fat based on height and weight. Your health care provider can find your BMI and can help you achieve or maintain a healthy weight.   For adults 20 years and older: ? A BMI below 18.5 is considered underweight. ? A BMI of 18.5 to 24.9 is normal. ? A BMI of 25 to 29.9 is considered overweight. ? A BMI of 30 and above is considered obese.   . Maintain normal blood lipids and cholesterol levels by exercising and minimizing your intake of saturated fat. Eat a balanced diet with plenty of fruit and vegetables. Blood tests for lipids and cholesterol should begin at age 28 and be repeated every 5 years. If your lipid or cholesterol levels are high, you are over 50, or you are at high risk for heart disease, you may need your cholesterol levels checked more frequently. Ongoing high lipid and  cholesterol levels should be treated with medicines if diet and exercise are not working.  . If you smoke, find out from your health care provider how to quit. If you do not use tobacco, please do not start.  . If you choose to drink alcohol, please do not consume more than 2 drinks per day. One drink is considered to be 12 ounces (355 mL) of beer, 5 ounces (148 mL) of wine, or 1.5 ounces (44 mL) of liquor.  . If you are 47-33 years old, ask your health care provider if you should take aspirin to prevent strokes.  . Use sunscreen.  Apply sunscreen liberally and repeatedly throughout the day. You should seek shade when your shadow is shorter than you. Protect yourself by wearing long sleeves, pants, a wide-brimmed hat, and sunglasses year round, whenever you are outdoors.  . Once a month, do a whole body skin exam, using a mirror to look at the skin on your back. Tell your health care provider of new moles, moles that have irregular borders, moles that are larger than a pencil eraser, or moles that have changed in shape or color.

## 2017-02-19 NOTE — Assessment & Plan Note (Addendum)
No change in meds.  D/w pt about meds, diet, exercise, rationale for labs.  See notes on labs.  He agrees.

## 2017-03-03 DIAGNOSIS — I10 Essential (primary) hypertension: Secondary | ICD-10-CM | POA: Diagnosis not present

## 2017-03-03 DIAGNOSIS — Z79899 Other long term (current) drug therapy: Secondary | ICD-10-CM | POA: Diagnosis not present

## 2017-03-13 ENCOUNTER — Ambulatory Visit: Payer: Medicare Other

## 2017-03-20 ENCOUNTER — Encounter: Payer: Medicare Other | Admitting: Family Medicine

## 2017-05-02 ENCOUNTER — Other Ambulatory Visit: Payer: Self-pay | Admitting: Family Medicine

## 2017-05-02 NOTE — Telephone Encounter (Signed)
Nitrostat tablet refill request.  Requesting 2 months supply- Wants one for in his pocket and always has a back up one in the refrigerator.  The 2 he has now are expired.  [facility] [address] [place], [address]

## 2017-05-02 NOTE — Telephone Encounter (Signed)
Electronic refill request. Nitroglycerin Last office visit:   02/18/17 Last Filled:   ? Please advise.

## 2017-05-02 NOTE — Telephone Encounter (Signed)
Copied from [place]. Topic: Quick Communication - Rx Refill/Question >> May 02, 2017  8:48 AM Ahmed Prima L wrote: Medication: nitroGLYCERIN (NITROSTAT) 0.4 MG SL tablet (is asking for two months worth, one for in his pocket and always has a back up one in the fridge. He said the two he has are expired Has the patient contacted their pharmacy? Yes no refills (Agent: If no, request that the patient contact the pharmacy for the refill.) Preferred Pharmacy (with phone number or street name): [id number] [address], [place]: Please be advised that RX refills may take up to 3 business days. We ask that you follow-up with your pharmacy.

## 2017-05-04 MED ORDER — NITROGLYCERIN 0.4 MG SL SUBL: 0.4000 mg | SUBLINGUAL_TABLET | SUBLINGUAL | 5 refills | Status: DC | PRN

## 2017-05-04 NOTE — Telephone Encounter (Signed)
Sent.  Thanks.  If needed at all, with relief of CP, then have patient call MD.   If no relief of CP after 3 doses, then have patient dial 911.

## 2017-05-05 NOTE — Telephone Encounter (Signed)
Left detailed message on voicemail.  

## 2017-05-12 ENCOUNTER — Other Ambulatory Visit: Payer: Self-pay

## 2017-05-12 MED ORDER — ATORVASTATIN CALCIUM 20 MG PO TABS: 20.0000 mg | ORAL_TABLET | Freq: Every day | ORAL | 3 refills | Status: DC

## 2017-05-12 NOTE — Telephone Encounter (Signed)
Sent. Thanks.  Okay to continue. 

## 2017-05-12 NOTE — Telephone Encounter (Signed)
PLEASE NOTE: All timestamps contained within this report are represented as [place] Standard Time. CONFIDENTIALTY NOTICE: This fax transmission is intended only for the addressee. It contains information that is legally privileged, confidential or otherwise protected from use or disclosure. If you are not the intended recipient, you are strictly prohibited from reviewing, disclosing, copying using or disseminating any of this information or taking any action in reliance on or regarding this information. If you have received this fax in error, please notify us immediately by telephone so that we can arrange for its return to [place]. Phone: [phone], Toll-Free: [phone], Fax: [phone] Page: 1 of 1 Call Id: [id number] [place] Patient Name: Jason Velazquez Gender: Male DOB: 1928-08-22 Age: 84 Y 11 M 13 D Return Phone Number: [phone] (Primary), [phone] (Secondary) Address: City/State/Zip: [place] [address] Client  [facility] - Client Client Site [place] Physician Renford Dills - MD Contact Type Call Who Is Calling Patient / Member / Family / Caregiver Call Type Triage / Clinical Relationship To Patient Self Return Phone Number [phone] (Primary) Chief Complaint Prescription Refill or Medication Request (non symptomatic) Reason for Call Symptomatic / Request for [place] States he needs a refill on his medication. The script services he uses can not get it to him until the 8th, wants to know if it is ok to not take it for that long. Translation No Nurse Assessment Nurse: Rene Kocher, RN, [place] Date/Time (Eastern Time): 05/10/2017 1:41:30 PM Confirm and document reason for call. If symptomatic, describe symptoms. ---caller states he is out of his medication- atorvastatin. denies need  for triage. pt is out of the medication. Does the patient have any new or worsening symptoms? ---No Please document clinical information provided and list any resource used. ---caller states he is out of his medication- atorvastatin. denies need for triage. pt is out of the medication. Guidelines Guideline Title Affirmed Question Affirmed Notes Nurse Date/Time (Eastern Time) Disp. Time Eilene Ghazi Time) Disposition Final User 05/10/2017 1:46:26 PM Clinical Call Yes Rene Kocher, RN, [place] Comments User: [place], [place], RN Date/Time (Eastern Time): 05/10/2017 1:46:17 PM pt uses [facility] and is unable to receive the medication until 05/19/17. advised pt to call the office on Monday to obtain a refill and it be sent to pharmacy near him that he would be able to receive the medication before 05/19/17. denies need for triage. pt states he will call Monday when the office is open.

## 2017-05-12 NOTE — Telephone Encounter (Signed)
Pt last seen 02/18/17; do not see that Dr Damita Dunnings has filled atorvastatin.Please advise.

## 2017-05-12 NOTE — Addendum Note (Signed)
Addended by: Helene Shoe on: 05/12/2017 08:49 AM   Modules accepted: Orders

## 2017-05-12 NOTE — Addendum Note (Signed)
Addended by: Tonia Ghent on: 05/12/2017 11:48 PM   Modules accepted: Orders

## 2017-05-14 DIAGNOSIS — L821 Other seborrheic keratosis: Secondary | ICD-10-CM | POA: Diagnosis not present

## 2017-05-14 DIAGNOSIS — D0422 Carcinoma in situ of skin of left ear and external auricular canal: Secondary | ICD-10-CM | POA: Diagnosis not present

## 2017-05-14 DIAGNOSIS — Z85828 Personal history of other malignant neoplasm of skin: Secondary | ICD-10-CM | POA: Diagnosis not present

## 2017-05-14 DIAGNOSIS — Z85820 Personal history of malignant melanoma of skin: Secondary | ICD-10-CM | POA: Diagnosis not present

## 2017-05-14 DIAGNOSIS — D3617 Benign neoplasm of peripheral nerves and autonomic nervous system of trunk, unspecified: Secondary | ICD-10-CM | POA: Diagnosis not present

## 2017-05-14 DIAGNOSIS — D485 Neoplasm of uncertain behavior of skin: Secondary | ICD-10-CM | POA: Diagnosis not present

## 2017-07-01 DIAGNOSIS — D3131 Benign neoplasm of right choroid: Secondary | ICD-10-CM | POA: Diagnosis not present

## 2017-08-19 ENCOUNTER — Encounter: Payer: Self-pay | Admitting: Family Medicine

## 2017-08-19 ENCOUNTER — Ambulatory Visit (INDEPENDENT_AMBULATORY_CARE_PROVIDER_SITE_OTHER): Payer: Medicare Other | Admitting: Family Medicine

## 2017-08-19 DIAGNOSIS — I10 Essential (primary) hypertension: Secondary | ICD-10-CM | POA: Diagnosis not present

## 2017-08-19 NOTE — Progress Notes (Signed)
D/w pt about possible trial off afrin, with a taper.    He had return of GERD sx when off PPI.  Controlled on PPI.    He was concerned about his BP/med, discussed today.  He has some BLE at the end of the day, not in the AM.  No CP.  Not SOB.  Not lightheaded.  He is still exercising.  He is active at [facility].  Not SOB when supine.    Meds, vitals, and allergies reviewed.  ROS: Per HPI unless specifically indicated in ROS section   GEN: nad, alert and oriented HEENT: mucous membranes moist NECK: supple w/o LA CV: rrr PULM: ctab, no inc wob ABD: soft, +bs EXT: no edema currently but in stockings and early in the day SKIN: well perfused.

## 2017-08-19 NOTE — Patient Instructions (Addendum)
Check your BP at home to calibrate your meter.   You can stop the amlodipine for about 7-10 days and see if you notice a difference in the swelling and your BP.  Restart if needed.   Update me as needed.   Take care.  Glad to see you.

## 2017-08-20 NOTE — Assessment & Plan Note (Signed)
He can check BP at home to calibrate his meter.   He can stop the amlodipine for about 7-10 days and see if he notices a difference in the swelling and BP.  Restart if needed.   Update me as needed.  He agrees.

## 2017-08-25 ENCOUNTER — Encounter: Payer: Self-pay | Admitting: Family Medicine

## 2017-08-25 ENCOUNTER — Ambulatory Visit (INDEPENDENT_AMBULATORY_CARE_PROVIDER_SITE_OTHER): Payer: Medicare Other | Admitting: Family Medicine

## 2017-08-25 DIAGNOSIS — R413 Other amnesia: Secondary | ICD-10-CM | POA: Diagnosis not present

## 2017-08-25 LAB — CBC WITH DIFFERENTIAL/PLATELET
BASOS PCT: 0.9 % (ref 0.0–3.0)
Basophils Absolute: 0.1 10*3/uL (ref 0.0–0.1)
EOS PCT: 1.8 % (ref 0.0–5.0)
Eosinophils Absolute: 0.1 10*3/uL (ref 0.0–0.7)
HCT: 42.8 % (ref 39.0–52.0)
Hemoglobin: 14.4 g/dL (ref 13.0–17.0)
LYMPHS ABS: 1.3 10*3/uL (ref 0.7–4.0)
Lymphocytes Relative: 22.1 % (ref 12.0–46.0)
MCHC: 33.8 g/dL (ref 30.0–36.0)
MCV: 90.8 fl (ref 78.0–100.0)
MONOS PCT: 11.4 % (ref 3.0–12.0)
Monocytes Absolute: 0.7 10*3/uL (ref 0.1–1.0)
NEUTROS ABS: 3.7 10*3/uL (ref 1.4–7.7)
NEUTROS PCT: 63.8 % (ref 43.0–77.0)
PLATELETS: 243 10*3/uL (ref 150.0–400.0)
RBC: 4.71 Mil/uL (ref 4.22–5.81)
RDW: 13.3 % (ref 11.5–15.5)
WBC: 5.8 10*3/uL (ref 4.0–10.5)

## 2017-08-25 LAB — COMPREHENSIVE METABOLIC PANEL
ALT: 16 U/L (ref 0–53)
AST: 18 U/L (ref 0–37)
Albumin: 4.1 g/dL (ref 3.5–5.2)
Alkaline Phosphatase: 65 U/L (ref 39–117)
BILIRUBIN TOTAL: 0.7 mg/dL (ref 0.2–1.2)
BUN: 9 mg/dL (ref 6–23)
CALCIUM: 9.1 mg/dL (ref 8.4–10.5)
CO2: 29 meq/L (ref 19–32)
CREATININE: 1.12 mg/dL (ref 0.40–1.50)
Chloride: 99 mEq/L (ref 96–112)
GFR: 65.88 mL/min (ref >60.00)
GLUCOSE: 107 mg/dL — AB (ref 70–99)
Potassium: 4.4 mEq/L (ref 3.5–5.1)
Sodium: 135 mEq/L (ref 135–145)
TOTAL PROTEIN: 6.7 g/dL (ref 6.0–8.3)

## 2017-08-25 LAB — TSH: TSH: 4.94 u[IU]/mL — ABNORMAL HIGH (ref 0.35–4.50)

## 2017-08-25 LAB — VITAMIN B12: Vitamin B-12: 133 pg/mL — ABNORMAL LOW (ref 211–911)
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
## 2017-08-25 NOTE — Patient Instructions (Signed)
Go to the lab on the way out.  We'll contact you with your lab report. We will call about your referral.  Rosaria Ferries or Azalee Course will call you if you don't see one of them on the way out.  Stop the amlodipine for now.  Don't drive for now.  We'll be in touch.  Take care.  Glad to see you.

## 2017-08-26 DIAGNOSIS — R413 Other amnesia: Secondary | ICD-10-CM | POA: Insufficient documentation

## 2017-08-26 DIAGNOSIS — G3184 Mild cognitive impairment, so stated: Secondary | ICD-10-CM | POA: Insufficient documentation

## 2017-08-26 LAB — RPR: RPR: NONREACTIVE

## 2017-08-26 NOTE — Assessment & Plan Note (Signed)
MMSE 26/30, -4 for attention and calculation.  He had 3/3 recall with sig effort.   Differential diagnosis discussed.  We will look for reversible causes with labs and CT.  See notes on imaging and labs.  Rationale discussed.  Would stop amlodipine given his history of edema and just to see if this makes any improvement, if relative hypotension could be contributing to memory changes. Nonfocal neurologic exam otherwise.  Still okay for outpatient follow-up.  Routine cautions discussed with patient and daughter.  Safety discussed.  Advised not to drive in the meantime.  Daughter is helping manage his finances. It could be that he truly has a primary memory loss such as Alzheimer's but we need to exclude other issues first.  All questions answered to the best my ability. >25 minutes spent in face to face time with patient, >50% spent in counselling or coordination of care.

## 2017-08-31 ENCOUNTER — Encounter: Payer: Self-pay | Admitting: Family Medicine

## 2017-08-31 ENCOUNTER — Other Ambulatory Visit: Payer: Self-pay | Admitting: Family Medicine

## 2017-08-31 DIAGNOSIS — E538 Deficiency of other specified B group vitamins: Secondary | ICD-10-CM | POA: Insufficient documentation

## 2017-08-31 MED ORDER — CYANOCOBALAMIN 1000 MCG/ML IJ SOLN: INTRAMUSCULAR | Status: DC

## 2017-09-02 ENCOUNTER — Ambulatory Visit
Admission: RE | Admit: 2017-09-02 | Discharge: 2017-09-02 | Disposition: A | Discharge status: Home / Self Care | Payer: Medicare Other | Source: Ambulatory Visit | Attending: Family Medicine | Admitting: Family Medicine

## 2017-09-02 ENCOUNTER — Telehealth: Payer: Self-pay | Admitting: Family Medicine

## 2017-09-02 DIAGNOSIS — Z8673 Personal history of transient ischemic attack (TIA), and cerebral infarction without residual deficits: Secondary | ICD-10-CM | POA: Insufficient documentation

## 2017-09-02 DIAGNOSIS — R413 Other amnesia: Secondary | ICD-10-CM

## 2017-09-02 NOTE — Telephone Encounter (Signed)
Copied from [place] [id number]. Topic: Quick Communication - Lab Results >> Sep 01, 2017  3:18 PM Josetta Huddle, CMA wrote: Called patient's daughter Jacqlyn Larsen, [place]) to inform them of  lab results. When patient's daughter returns call, triage nurse may disclose results.  See lab result dated 09/01/17   Patient daughter Jacqlyn Larsen called and states a call was missed in reference to lab results. CB# [phone]

## 2017-09-02 NOTE — Telephone Encounter (Signed)
Copied from [place] [id number]. Topic: Quick Communication - Lab Results >> Sep 01, 2017  3:18 PM Josetta Huddle, CMA wrote: Called patient's daughter Jacqlyn Larsen, [place]) to inform them of  lab results. When patient's daughter returns call, triage nurse may disclose results.  See lab result dated 09/01/17

## 2017-09-02 NOTE — Telephone Encounter (Signed)
Charted in result notes. 

## 2017-09-08 ENCOUNTER — Ambulatory Visit: Payer: Self-pay

## 2017-09-08 NOTE — Telephone Encounter (Signed)
   Reason for Disposition . [1] Follow-up call to recent contact AND [2] information only call, no triage required    Daughter requesting B12 prescription sent to [facility]. See note  Answer Assessment - Initial Assessment Questions 1. REASON FOR CALL or QUESTION: "What is your reason for calling today?" or "How can I best help you?" or "What question do you have that I can help answer?"     ;ab results for her Dad.  Daughter is on DPR 2. CALLER: Document the source of call. (e.g., laboratory, patient).      Lab results for her Dad.  Daughter is on  Lab results  Answer Assessment - Initial Assessment Questions 1. REASON FOR CALL or QUESTION: "What is your reason for calling today?" or "How can I best help you?" or "What question do you have that I can help answer?"     Called for lab results.  See note for daughter's request.  Protocols used: INFORMATION ONLY CALL-A-AH, PCP CALL - NO TRIAGE-A-AH

## 2017-09-08 NOTE — Telephone Encounter (Signed)
Incoming call requesting lab results.  Not in basket.  Provided results per Dr.  Elsie Stain.  Of 08/31/17.  Daughter voiced understanding. Sates that they would do replacement therapy with B12. Desire for it to be at [facility]. Understands the course of treatments.  [facility] [address]

## 2017-09-09 NOTE — Addendum Note (Signed)
Addended by: Helene Shoe on: 09/09/2017 08:44 AM   Modules accepted: Orders

## 2017-09-10 MED ORDER — CYANOCOBALAMIN 1000 MCG/ML IJ SOLN: INTRAMUSCULAR | 1 refills | Status: DC

## 2017-09-10 NOTE — Addendum Note (Signed)
Addended by: Tonia Ghent on: 09/10/2017 10:57 AM   Modules accepted: Orders

## 2017-09-10 NOTE — Telephone Encounter (Signed)
Jacqlyn Larsen (patient's daughter) notified that everything has been sent to the pharmacy per her request.

## 2017-09-10 NOTE — Telephone Encounter (Signed)
Called and spoke to [place] [employer] manager) and script given to her verbally as instructed.

## 2017-09-10 NOTE — Telephone Encounter (Addendum)
rx sent for B12.  Please also send in order for size appropriate syringe and needles, 10 of each with 1 rf.  Thanks.

## 2017-09-15 ENCOUNTER — Ambulatory Visit: Payer: Medicare Other | Admitting: Podiatry

## 2017-09-18 ENCOUNTER — Ambulatory Visit: Payer: Medicare Other | Admitting: Podiatry

## 2017-09-25 ENCOUNTER — Ambulatory Visit (INDEPENDENT_AMBULATORY_CARE_PROVIDER_SITE_OTHER): Payer: Medicare Other | Admitting: Podiatry

## 2017-09-25 ENCOUNTER — Encounter: Payer: Self-pay | Admitting: Podiatry

## 2017-09-25 DIAGNOSIS — B351 Tinea unguium: Secondary | ICD-10-CM

## 2017-09-25 DIAGNOSIS — D689 Coagulation defect, unspecified: Secondary | ICD-10-CM

## 2017-09-25 DIAGNOSIS — M79674 Pain in right toe(s): Secondary | ICD-10-CM

## 2017-09-25 DIAGNOSIS — M79675 Pain in left toe(s): Secondary | ICD-10-CM

## 2017-09-25 NOTE — Progress Notes (Signed)
Complaint:  Visit Type: Patient returns to my office for continued preventative foot care services. Complaint: Patient states" my nails have grown long and thick and become painful to walk and wear shoes" . The patient presents for preventative foot care services. No changes to ROS  Podiatric Exam: Vascular: dorsalis pedis and posterior tibial pulses are palpable bilateral. Capillary return is immediate. Temperature gradient is WNL. Skin turgor WNL  Sensorium: Normal Semmes Weinstein monofilament test. Normal tactile sensation bilaterally. Nail Exam: Pt has thick disfigured discolored nails with subungual debris noted bilateral entire nail hallux through fifth toenails Ulcer Exam: There is no evidence of ulcer or pre-ulcerative changes or infection. Orthopedic Exam: Muscle tone and strength are WNL. No limitations in general ROM. No crepitus or effusions noted. Foot type and digits show no abnormalities. Bony prominences are unremarkable. Skin: No Porokeratosis. No infection or ulcers  Diagnosis:  Onychomycosis, , Pain in right toe, pain in left toes  Treatment & Plan Procedures and Treatment: Consent by patient was obtained for treatment procedures.   Debridement of mycotic and hypertrophic toenails, 1 through 5 bilateral and clearing of subungual debris. No ulceration, no infection noted.  Return Visit-Office Procedure: Patient instructed to return to the office for a follow up visit 3 months for continued evaluation and treatment.    Dyllon Henken DPM 

## 2017-10-20 ENCOUNTER — Telehealth: Payer: Self-pay

## 2017-10-20 NOTE — Telephone Encounter (Signed)
Copied from [place] [id number]. Topic: General - Other >> Oct 20, 2017  8:58 AM Conception Chancy, NT wrote: Reason for CRM: patient is calling and states he had a CT scan on 09/02/17 and states that since then he has developed back behind his left ear. He states this is concerning and would like for [facility] to call him.

## 2017-10-20 NOTE — Telephone Encounter (Signed)
Patient is scheduled for appointment 10/21/17.

## 2017-10-21 ENCOUNTER — Ambulatory Visit (INDEPENDENT_AMBULATORY_CARE_PROVIDER_SITE_OTHER): Payer: Medicare Other | Admitting: Family Medicine

## 2017-10-21 ENCOUNTER — Encounter: Payer: Self-pay | Admitting: Family Medicine

## 2017-10-21 DIAGNOSIS — E538 Deficiency of other specified B group vitamins: Secondary | ICD-10-CM

## 2017-10-21 DIAGNOSIS — R51 Headache: Secondary | ICD-10-CM

## 2017-10-21 DIAGNOSIS — R519 Headache, unspecified: Secondary | ICD-10-CM

## 2017-10-21 NOTE — Progress Notes (Signed)
B12 replacement ongoing.  Had weekly then transitioned to monthly, done at [facility].  Off amlodipine.  He thought his memory was about the same as prev.  D/w pt.  He wouldn't have had time for the B12 level to change much.    He was watching football Saturday.  He rubbed his head and noted a tender sore spot behind the L ear.  Noted again Sunday but less noted today.  Only behind the L ear, no R sided sx.  He couldn't check the area in a mirror. No fevers. Doesn't feel unwell o/w.  No trauma.  No ear pain.  Hearing is at baseline, wears hearing aids.  He never felt a mass.   Meds, vitals, and allergies reviewed.   ROS: Per HPI unless specifically indicated in ROS section   nad ncat L pinna and canal and TM wnl OP wnl MMM Neck supple, no lymphadenopathy Occiput and postauricular area with no lesion, no LA.  Not ttp now.   No preauricular lymphadenopathy.  Left TMJ not tender.

## 2017-10-21 NOTE — Patient Instructions (Addendum)
You likely wouldn't have had time for the B12 level to change much at this point.  We can recheck a level in a few months - November.   You likely had muscle irritation where it attaches to the ridge of bone behind your ear.  This should gradually resolve.  Gently stretch.    Update me as needed.   Take care.  Glad to see you.

## 2017-10-22 DIAGNOSIS — R51 Headache: Principal | ICD-10-CM

## 2017-10-22 DIAGNOSIS — R519 Headache, unspecified: Secondary | ICD-10-CM | POA: Insufficient documentation

## 2017-10-22 NOTE — Assessment & Plan Note (Signed)
He has ongoing B12 replacement and we can recheck later on.  He agrees.

## 2017-10-22 NOTE — Assessment & Plan Note (Signed)
He likely had muscle pain at the insertions near the mastoid and/or left side of the occipital ridge.  No mass.  No rash.  Ear appears normal.  Symptoms are better now.  No mass.  Reassured.  See after visit summary.  Update me as needed.  He agrees.

## 2017-10-28 DIAGNOSIS — L820 Inflamed seborrheic keratosis: Secondary | ICD-10-CM | POA: Diagnosis not present

## 2017-10-28 DIAGNOSIS — D2262 Melanocytic nevi of left upper limb, including shoulder: Secondary | ICD-10-CM | POA: Diagnosis not present

## 2017-10-28 DIAGNOSIS — X32XXXA Exposure to sunlight, initial encounter: Secondary | ICD-10-CM | POA: Diagnosis not present

## 2017-10-28 DIAGNOSIS — L538 Other specified erythematous conditions: Secondary | ICD-10-CM | POA: Diagnosis not present

## 2017-10-28 DIAGNOSIS — L570 Actinic keratosis: Secondary | ICD-10-CM | POA: Diagnosis not present

## 2017-10-28 DIAGNOSIS — Z85820 Personal history of malignant melanoma of skin: Secondary | ICD-10-CM | POA: Diagnosis not present

## 2017-10-28 DIAGNOSIS — D2272 Melanocytic nevi of left lower limb, including hip: Secondary | ICD-10-CM | POA: Diagnosis not present

## 2017-10-28 DIAGNOSIS — D225 Melanocytic nevi of trunk: Secondary | ICD-10-CM | POA: Diagnosis not present

## 2017-10-28 DIAGNOSIS — Z85828 Personal history of other malignant neoplasm of skin: Secondary | ICD-10-CM | POA: Diagnosis not present

## 2017-10-28 DIAGNOSIS — D2261 Melanocytic nevi of right upper limb, including shoulder: Secondary | ICD-10-CM | POA: Diagnosis not present

## 2017-10-28 DIAGNOSIS — L821 Other seborrheic keratosis: Secondary | ICD-10-CM | POA: Diagnosis not present

## 2017-11-27 ENCOUNTER — Other Ambulatory Visit: Payer: Self-pay | Admitting: Family Medicine

## 2017-11-27 DIAGNOSIS — Z23 Encounter for immunization: Secondary | ICD-10-CM | POA: Diagnosis not present

## 2017-11-27 NOTE — Telephone Encounter (Signed)
Last OV 10/21/2017. Last B12 labs to be repeated in 12/2017

## 2017-11-28 NOTE — Telephone Encounter (Signed)
Sent. Thanks.   

## 2018-02-10 ENCOUNTER — Telehealth: Payer: Self-pay | Admitting: *Deleted

## 2018-02-10 NOTE — Telephone Encounter (Signed)
Patient phoned in saying that he flew in to the airport after a wedding in [place] and got a [place] sandwich in the airport that he believes might have been had.  Patient states his esophagus has been painful.  Patient states he did vomit last night but not since then nor does he feel nauseated.  No diarrhea.  Patient is taking Mylanta and TUMS but his throat/esophagus still feels painful.  I asked if this occurred before or after vomiting but the patient could not remember.  Patient asks if there is anything else he can do for some relief?

## 2018-02-10 NOTE — Telephone Encounter (Signed)
Spoke w pt. Vomited and diarrhea x1 after eating suspect [place] sandwich.  Actually took some more mylanta and veggie soup today for lunch and throat feels much better. No white spots in esophagus.  Advised continue course, soft mechanical diet avoid sharp foods, let us know if not continuing to improve

## 2018-02-19 ENCOUNTER — Ambulatory Visit (INDEPENDENT_AMBULATORY_CARE_PROVIDER_SITE_OTHER): Payer: Medicare Other

## 2018-02-19 ENCOUNTER — Other Ambulatory Visit: Payer: Self-pay | Admitting: Family Medicine

## 2018-02-19 VITALS — BP 118/84 | HR 61 | Temp 97.8°F | Ht 65.0 in | Wt 178.5 lb

## 2018-02-19 DIAGNOSIS — E538 Deficiency of other specified B group vitamins: Secondary | ICD-10-CM

## 2018-02-19 DIAGNOSIS — R39198 Other difficulties with micturition: Secondary | ICD-10-CM | POA: Diagnosis not present

## 2018-02-19 DIAGNOSIS — R1030 Lower abdominal pain, unspecified: Secondary | ICD-10-CM | POA: Diagnosis not present

## 2018-02-19 DIAGNOSIS — I10 Essential (primary) hypertension: Secondary | ICD-10-CM

## 2018-02-19 DIAGNOSIS — Z0000 Encounter for general adult medical examination without abnormal findings: Secondary | ICD-10-CM | POA: Diagnosis not present

## 2018-02-19 LAB — POC URINALSYSI DIPSTICK (AUTOMATED)
Bilirubin, UA: NEGATIVE
Blood, UA: NEGATIVE
Glucose, UA: NEGATIVE
Ketones, UA: NEGATIVE
Leukocytes, UA: NEGATIVE
Nitrite, UA: NEGATIVE
Protein, UA: NEGATIVE
Spec Grav, UA: <=1.005 — AB (ref 1.010–1.025)
Urobilinogen, UA: 0.2 E.U./dL
pH, UA: 6 (ref 5.0–8.0)

## 2018-02-19 LAB — COMPREHENSIVE METABOLIC PANEL
ALBUMIN: 3.9 g/dL (ref 3.5–5.2)
ALT: 20 U/L (ref 0–53)
AST: 23 U/L (ref 0–37)
Alkaline Phosphatase: 70 U/L (ref 39–117)
BUN: 13 mg/dL (ref 6–23)
CHLORIDE: 99 meq/L (ref 96–112)
CO2: 29 meq/L (ref 19–32)
Calcium: 9.3 mg/dL (ref 8.4–10.5)
Creatinine, Ser: 1.14 mg/dL (ref 0.40–1.50)
GFR: 64.47 mL/min (ref >60.00)
Glucose, Bld: 97 mg/dL (ref 70–99)
POTASSIUM: 4.9 meq/L (ref 3.5–5.1)
SODIUM: 133 meq/L — AB (ref 135–145)
Total Bilirubin: 0.8 mg/dL (ref 0.2–1.2)
Total Protein: 6.3 g/dL (ref 6.0–8.3)

## 2018-02-19 LAB — LIPID PANEL
Cholesterol: 128 mg/dL (ref 0–200)
HDL: 35.3 mg/dL — ABNORMAL LOW (ref >39.00)
LDL Cholesterol: 65 mg/dL (ref 0–99)
NonHDL: 92.69
Total CHOL/HDL Ratio: 4
Triglycerides: 136 mg/dL (ref 0.0–149.0)
VLDL: 27.2 mg/dL (ref 0.0–40.0)

## 2018-02-19 LAB — VITAMIN B12: Vitamin B-12: 658 pg/mL (ref 211–911)

## 2018-02-19 MED ORDER — CYANOCOBALAMIN 1000 MCG/ML IJ SOLN
1000.0000 ug | Freq: Once | INTRAMUSCULAR | Status: AC
  Administered 2018-02-19: 1000 ug via INTRAMUSCULAR

## 2018-02-19 MED ORDER — CYANOCOBALAMIN 1000 MCG/ML IJ SOLN: 1000.0000 ug | Freq: Once | INTRAMUSCULAR | Status: DC

## 2018-02-19 NOTE — Progress Notes (Signed)
PCP notes:   Health maintenance:  Tetanus vaccine - postponed/insurance  Abnormal screenings:   None  Patient concerns:   A. Left eye - complaint of "kalidescope vision in peripheral". Onset: 02/18/18 lasting 4-5 minutes. Reoccurrence: 02/19/18 lasting 2-3 minutes. Denies vision loss, headache, eye pain, or head trauma. Hx of stroke approx. 5 yearsg ago.  B. Right-sided pain in groin - onset 02/18/18. Pain scale: 4/10. Reoccurrence 02/19/18. Denies pain, blood, or discharge. States urine flow is "weak".   PCP notified of these changes. UA ordered. Specimen collected from patient. Results of UA forwarded to PCP for further evaluation.    Nurse concerns:  Per orders of Dr. Damita Dunnings, injection of B12 given by Lindell Noe. Patient tolerated injection well to right deltoid.  Next PCP appt:   02/24/18 @ 1415  See notes on labs. I reviewed health advisor's note, was available for consultation on the day of service listed in this note, and agree with documentation and plan. Elsie Stain, MD.

## 2018-02-19 NOTE — Patient Instructions (Signed)
 Preventive Care for Adults  A healthy lifestyle and preventive care can promote health and wellness. Preventive health guidelines for adults include the following key practices.  . A routine yearly physical is a good way to check with your health care provider about your health and preventive screening. It is a chance to share any concerns and updates on your health and to receive a thorough exam.  . Visit your dentist for a routine exam and preventive care every 6 months. Brush your teeth twice a day and floss once a day. Good oral hygiene prevents tooth decay and gum disease.  . The frequency of eye exams is based on your age, health, family medical history, use  of contact lenses, and other factors. Follow your health care provider's recommendations for frequency of eye exams.  . Eat a healthy diet. Foods like vegetables, fruits, whole grains, low-fat dairy products, and lean protein foods contain the nutrients you need without too many calories. Decrease your intake of foods high in solid fats, added sugars, and salt. Eat the right amount of calories for you. Get information about a proper diet from your health care provider, if necessary.  . Regular physical exercise is one of the most important things you can do for your health. Most adults should get at least 150 minutes of moderate-intensity exercise (any activity that increases your heart rate and causes you to sweat) each week. In addition, most adults need muscle-strengthening exercises on 2 or more days a week.  Silver Sneakers may be a benefit available to you. To determine eligibility, you may visit the website: www.silversneakers.com or contact program at 1-866-584-7389 Mon-Fri between 8AM-8PM.   . Maintain a healthy weight. The body mass index (BMI) is a screening tool to identify possible weight problems. It provides an estimate of body fat based on height and weight. Your health care provider can find your BMI and can help you  achieve or maintain a healthy weight.   For adults 20 years and older: ? A BMI below 18.5 is considered underweight. ? A BMI of 18.5 to 24.9 is normal. ? A BMI of 25 to 29.9 is considered overweight. ? A BMI of 30 and above is considered obese.   . Maintain normal blood lipids and cholesterol levels by exercising and minimizing your intake of saturated fat. Eat a balanced diet with plenty of fruit and vegetables. Blood tests for lipids and cholesterol should begin at age 20 and be repeated every 5 years. If your lipid or cholesterol levels are high, you are over 50, or you are at high risk for heart disease, you may need your cholesterol levels checked more frequently. Ongoing high lipid and cholesterol levels should be treated with medicines if diet and exercise are not working.  . If you smoke, find out from your health care provider how to quit. If you do not use tobacco, please do not start.  . If you choose to drink alcohol, please do not consume more than 2 drinks per day. One drink is considered to be 12 ounces (355 mL) of beer, 5 ounces (148 mL) of wine, or 1.5 ounces (44 mL) of liquor.  . If you are 55-79 years old, ask your health care provider if you should take aspirin to prevent strokes.  . Use sunscreen. Apply sunscreen liberally and repeatedly throughout the day. You should seek shade when your shadow is shorter than you. Protect yourself by wearing long sleeves, pants, a wide-brimmed hat, and sunglasses   year round, whenever you are outdoors.  . Once a month, do a whole body skin exam, using a mirror to look at the skin on your back. Tell your health care provider of new moles, moles that have irregular borders, moles that are larger than a pencil eraser, or moles that have changed in shape or color.     

## 2018-02-19 NOTE — Progress Notes (Signed)
Subjective:   Jason Velazquez is a 85 y.o. male who presents for Medicare Annual/Subsequent preventive examination.  Review of Systems:  N/A Cardiac Risk Factors include: advanced age (>46men, >5 women);dyslipidemia;hypertension;male gender;obesity (BMI >30kg/m2)     Objective:    Vitals: BP 118/84 (BP Location: Right Arm, Patient Position: Sitting, Cuff Size: Normal)   Pulse 61   Temp 97.8 F (36.6 C) (Oral)   Ht 5\' 5"  (1.651 m) Comment: shoes  Wt 178 lb 8 oz (81 kg)   SpO2 96%   BMI 29.70 kg/m   Body mass index is 29.7 kg/m.  Advanced Directives 02/19/2018 02/18/2017 05/26/2013 07/22/2012 04/09/2011 04/03/2011  Does Patient Have a Medical Advance Directive? Yes Yes Patient has advance directive, copy not in chart Patient has advance directive, copy not in chart Patient has advance directive, copy in chart Patient has advance directive, copy in chart  Type of Advance Directive [place];Living will [place];Living will Living will;Healthcare Power of [place]  Does patient want to make changes to medical advance directive? - - No change requested - - -  Copy of [place] in Chart? No - copy requested No - copy requested - Copy requested from family - -  Pre-existing out of facility DNR order (yellow form or pink MOST form) - - - No No No    Tobacco Social History   Tobacco Use  Smoking Status Never Smoker  Smokeless Tobacco Never Used     Counseling given: No   Clinical Intake:  Pre-visit preparation completed: Yes  Pain : No/denies pain Pain Score: 0-No pain     Nutritional Status: BMI 25 -29 Overweight Nutritional Risks: None Diabetes: No  How often do you need to have someone help you when you read instructions, pamphlets, or other written materials from your doctor or pharmacy?: 1 - Never What is the last grade level you  completed in school?: Master degree  Interpreter Needed?: No  Comments: pt is a widower and lives at [place] 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 Allergic rhinitis   . Benign prostatic hypertrophy   . Coronary artery disease CARDIOLOGIST -- DR Daneen Schick  . Duodenal diverticulum 2003  . GERD (gastroesophageal reflux disease)   . H/O hiatal hernia 2003  . Heart murmur   . Hepatic lesion 2004  . History of melanoma excision    FACE  . Hypercholesteremia   . Hypertension   . Left carotid stenosis    > 50%  PER DUPLEX  03-28-2011  . Mild mitral regurgitation   . Perianal fistula   . S/P CABG x 4    1988  . S/P drug eluting coronary stent placement    POST CABG--  STENTING 2000  AND RESTENTING 2006 FOR REINSTENT STENOSIS  . Stroke [facility])    Past Surgical History:  Procedure Laterality Date  . CATARACT EXTRACTION W/ INTRAOCULAR LENS  IMPLANT, BILATERAL    . CORONARY ANGIOPLASTY WITH STENT PLACEMENT  2000   PCI AND STENTING CIRCUMFLEX  . CORONARY ANGIOPLASTY WITH STENT PLACEMENT  04-30-2004  DR Daneen Schick   RE-INSTENT STENOSIS/  DRUG-ELUTING STENT OF PROXIMAL AND MID CIRCUMFLEX/ PCI MID CIRCUMFLEX THROUGH STENT STRUT/  WIDELY PATENT SAPHENOUS VEIN GRAFT AND  LIMA TO LAD GRAFT/ TOTAL OCCLUSION RIGHT CORONARY BEYOND THE POSTERIOR DESCENDING ARTERY BRANCH WITH 50% OSTIAL NARROWING/ TOTAL  OCCLUSION OF LAD AT THE OSTIUM OF THE LEFT MAIN  . CORONARY ARTERY BYPASS GRAFT  1988   X5  . EVALUATION UNDER ANESTHESIA WITH ANAL FISTULECTOMY N/A 07/22/2012   Procedure: EXAM UNDER ANESTHESIA WITH ANAL fistulotomy;  Surgeon: Leighton Ruff, MD;  Location: [place];  Service: General;  Laterality: N/A;  . SHOULDER ARTHROSCOPY WITH OPEN ROTATOR CUFF REPAIR Right 07-10-1999  . TONSILLECTOMY AND ADENOIDECTOMY  1944  . TRANSTHORACIC ECHOCARDIOGRAM  03-01-2011  DR Daneen Schick   NORMAL LVF AND LV SIZE/ MILD LEFT ATRIAL ENLARGEMENT/ GRADE  II DIASTOLIC DYSFUNCTION WITH ELEVATED LEFT ATRIAL PRESSURE/ MILD TO MODERATE MR/ MILD TR  . TRANSURETHRAL RESECTION OF PROSTATE  04/09/2011   Procedure: TRANSURETHRAL RESECTION OF THE PROSTATE WITH GYRUS INSTRUMENTS;  Surgeon: Malka So, MD;  Location: [facility];  Service: Urology;  Laterality: N/A;   Family History  Problem Relation Age of Onset  . Breast cancer Mother   . Stroke Father   . Colon cancer Brother    Social History   Socioeconomic History  . Marital status: Widowed    Spouse name: [name]  . Number of children: 3  . Years of education: B.D.  . Highest education level: Not on file  Occupational History  . Occupation: retired  Scientific laboratory technician  . Financial resource strain: Not on file  . Food insecurity:    Worry: Not on file    Inability: Not on file  . Transportation needs:    Medical: Not on file    Non-medical: Not on file  Tobacco Use  . Smoking status: Never Smoker  . Smokeless tobacco: Never Used  Substance and Sexual Activity  . Alcohol use: No  . Drug use: No  . Sexual activity: Not Currently  Lifestyle  . Physical activity:    Days per week: Not on file    Minutes per session: Not on file  . Stress: Not on file  Relationships  . Social connections:    Talks on phone: Not on file    Gets together: Not on file    Attends religious service: Not on file    Active member of club or organization: Not on file    Attends meetings of clubs or organizations: Not on file    Relationship status: Not on file  Other Topics Concern  . Not on file  Social History Narrative   L[facility], at [facility].  Has some help with housecleaning but o/w he is independent.     Was married to first wife 104 years, then to second wife 12 years.  Widowed twice.        Likes to travel.     Retired Theme park manager, [employer]   One son was killed in [place] in 1984   One son and one daughter still alive in 2019    Outpatient Encounter Medications as of 02/19/2018    Medication Sig  . atenolol (TENORMIN) 25 MG tablet Take 25 mg by mouth daily after breakfast.  . atorvastatin (LIPITOR) 20 MG tablet Take 1 tablet (20 mg total) by mouth at bedtime.  . clopidogrel (PLAVIX) 75 MG tablet Take 75 mg by mouth daily.  . cyanocobalamin (,VITAMIN B-12,) 1000 MCG/ML injection INJECT 1ML IM MONTHLY  . docusate sodium (COLACE) 100 MG capsule Take 200 mg by mouth 2 (two) times daily.   Marland Kitchen ketoconazole (NIZORAL) 2 % cream Apply 1 application topically 2 (two) times daily.  Marland Kitchen NEEDLE, DISP, 25 G 25G X  1" MISC by Does not apply route. Use as directed  . nitroGLYCERIN (NITROSTAT) 0.4 MG SL tablet Place 1 tablet (0.4 mg total) under the tongue every 5 (five) minutes as needed for chest pain.  . pantoprazole (PROTONIX) 40 MG tablet Take 40 mg by mouth daily.  . polyethylene glycol (MIRALAX / GLYCOLAX) packet Take 17 g by mouth daily.  . Syringe/Needle, Disp, (SYRINGE 3CC/25GX1") 25G X 1" 3 ML MISC by Does not apply route. Use as iinstructed   Facility-Administered Encounter Medications as of 02/19/2018  Medication  . betamethasone acetate-betamethasone sodium phosphate (CELESTONE) injection 12 mg  . betamethasone acetate-betamethasone sodium phosphate (CELESTONE) injection 3 mg  . [COMPLETED] cyanocobalamin ((VITAMIN B-12)) injection 1,000 mcg  . [DISCONTINUED] cyanocobalamin ((VITAMIN B-12)) injection 1,000 mcg    Activities of Daily Living In your present state of health, do you have any difficulty performing the following activities: 02/19/2018  Hearing? Y  Vision? N  Difficulty concentrating or making decisions? Y  Walking or climbing stairs? N  Dressing or bathing? N  Doing errands, shopping? N  Preparing Food and eating ? N  Using the Toilet? N  In the past six months, have you accidently leaked urine? N  Do you have problems with loss of bowel control? N  Managing your Medications? N  Managing your Finances? N  Housekeeping or managing your Housekeeping? N   Some recent data might be hidden    Patient Care Team: Tonia Ghent, MD as PCP - General (Family Medicine) Leandrew Koyanagi, MD as Referring Physician (Ophthalmology)   Assessment:   This is a routine wellness examination for Jason Velazquez.  Hearing Screening Comments: Bilateral hearing aids Vision Screening Comments: Vision exam in June 2019 @ [facility]  Exercise Activities and Dietary recommendations Current Exercise Habits: Structured exercise class, Type of exercise: Other - see comments;stretching;walking(water aerobics, core exercises), Time (Minutes): 60, Frequency (Times/Week): 6, Weekly Exercise (Minutes/Week): 360, Intensity: Moderate, Exercise limited by: None identified  Goals    . Increase physical activity     Starting 02/19/18, I will continue to exercise for at least 60 minutes 6 days per week.        Fall Risk Fall Risk  02/19/2018 02/18/2017 11/02/2015  Falls in the past year? 0 No Yes   Depression Screen PHQ 2/9 Scores 02/19/2018 02/18/2017 11/02/2015  PHQ - 2 Score 0 0 1  PHQ- 9 Score 0 0 -    Cognitive Function MMSE - Mini Mental State Exam 02/19/2018 02/18/2017  Orientation to time 5 5  Orientation to Place 5 5  Registration 3 3  Attention/ Calculation 0 0  Recall 3 3  Language- name 2 objects 0 0  Language- repeat 1 1  Language- follow 3 step command 3 3  Language- read & follow direction 0 0  Write a sentence 0 0  Copy design 0 0  Total score 20 20     PLEASE NOTE: A Mini-Cog screen was completed. Maximum score is 20. A value of 0 denotes this part of [place] MMSE was not completed or the patient failed this part of the Mini-Cog screening.   Mini-Cog Screening Orientation to Time - Max 5 pts Orientation to Place - Max 5 pts Registration - Max 3 pts Recall - Max 3 pts Language Repeat - Max 1 pts Language Follow 3 Step Command - Max 3 pts     Immunization History  Administered Date(s) Administered  . Influenza, High Dose Seasonal PF  12/13/2013, 12/27/2014, 11/02/2015, 11/11/2017  .  Influenza-Unspecified 11/11/2016  . Pneumococcal Conjugate-13 03/14/2014  . Pneumococcal Polysaccharide-23 02/11/2013  . Zoster 02/11/2013    Screening Tests Health Maintenance  Topic Date Due  . TETANUS/TDAP  02/11/2019 (Originally 05/28/1947)  . INFLUENZA VACCINE  Completed  . PNA vac Low Risk Adult  Completed     Plan:     I have personally reviewed, addressed, and noted the following in the patient's chart:  A. Medical and social history B. Use of alcohol, tobacco or illicit drugs  C. Current medications and supplements D. Functional ability and status E.  Nutritional status F.  Physical activity G. Advance directives H. List of other physicians I.  Hospitalizations, surgeries, and ER visits in previous 12 months J.  [place] to include hearing, vision, cognitive, depression L. Referrals and appointments - none  In addition, I have reviewed and discussed with patient certain preventive protocols, quality metrics, and best practice recommendations. A written personalized care plan for preventive services as well as general preventive health recommendations were provided to patient.  See attached scanned questionnaire for additional information.   Signed,   Lindell Noe, MHA, BS, LPN Health Coach

## 2018-02-20 ENCOUNTER — Telehealth: Payer: Self-pay

## 2018-02-20 NOTE — Telephone Encounter (Signed)
Patient advised of all the results-see lab results notes

## 2018-02-20 NOTE — Telephone Encounter (Signed)
Called patient and he will call me back a little later today due to he was at a breakfast right now. He will ask for me.

## 2018-02-24 ENCOUNTER — Ambulatory Visit (INDEPENDENT_AMBULATORY_CARE_PROVIDER_SITE_OTHER): Payer: Medicare Other | Admitting: Family Medicine

## 2018-02-24 ENCOUNTER — Encounter: Payer: Self-pay | Admitting: Family Medicine

## 2018-02-24 VITALS — BP 142/76 | HR 69 | Temp 98.1°F | Ht 65.0 in | Wt 183.5 lb

## 2018-02-24 DIAGNOSIS — K219 Gastro-esophageal reflux disease without esophagitis: Secondary | ICD-10-CM

## 2018-02-24 DIAGNOSIS — E538 Deficiency of other specified B group vitamins: Secondary | ICD-10-CM

## 2018-02-24 DIAGNOSIS — H539 Unspecified visual disturbance: Secondary | ICD-10-CM | POA: Diagnosis not present

## 2018-02-24 DIAGNOSIS — I25810 Atherosclerosis of coronary artery bypass graft(s) without angina pectoris: Secondary | ICD-10-CM

## 2018-02-24 DIAGNOSIS — R413 Other amnesia: Secondary | ICD-10-CM | POA: Diagnosis not present

## 2018-02-24 DIAGNOSIS — Z0000 Encounter for general adult medical examination without abnormal findings: Secondary | ICD-10-CM | POA: Diagnosis not present

## 2018-02-24 DIAGNOSIS — E785 Hyperlipidemia, unspecified: Secondary | ICD-10-CM | POA: Diagnosis not present

## 2018-02-24 DIAGNOSIS — Z7189 Other specified counseling: Secondary | ICD-10-CM

## 2018-02-24 MED ORDER — CYANOCOBALAMIN 1000 MCG/ML IJ SOLN: INTRAMUSCULAR | 1 refills | Status: DC

## 2018-02-24 MED ORDER — PANTOPRAZOLE SODIUM 40 MG PO TBEC: 40.0000 mg | DELAYED_RELEASE_TABLET | Freq: Every day | ORAL | 3 refills | Status: DC

## 2018-02-24 MED ORDER — ATORVASTATIN CALCIUM 20 MG PO TABS: 20.0000 mg | ORAL_TABLET | Freq: Every day | ORAL | 3 refills | Status: DC

## 2018-02-24 NOTE — Patient Instructions (Addendum)
Check with your insurance to see if they will cover the shingrix shot. Please call about an eye exam.  Ask your family about your memory and let me know what they think.  Call the clinic at [phone] with an update.   It appears that your memory changes could have been at least partly due to B12 deficiency. You did better on your memory testing today.    I would continue B12 injections.  I would like to recheck you in summer 2020, to recheck your labs and memory.  You don't need labs ahead of time.   Take care.  Glad to see you.

## 2018-02-24 NOTE — Progress Notes (Signed)
B12 def.  He thought his memory could be some better in the meantime.  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  He did that without troubles.  He had previously documented low B12 level with subsequent improvement with treatment.  PPI use d/w pt.  Sx controlled on PPI.  Failed taper prev, d/w pt. unclear if he has decreased B12 absorption related to PPI use but he clearly has decrease in GERD symptoms on PPI.  Vision changes prev.  Similar event yesterday but not o/w.  He didn't lose vision.   Lasted a few minutes.  Had eye exam about 5-6 months ago.  No other focal neuro changes.  No symptoms now.  R sided groin pain resolved in the meantime.  Urine stream is okay.  No blood seen in urine.  Recent labs discussed with patient.  CAD.    Using medication without problems or lightheadedness: yes Chest pain with exertion:no Edema:minimal BLE edema, stable.   Short of breath:no Labs d/w pt.   Elevated Cholesterol: Using medications without problems:yes Muscle aches: no Diet compliance:yes Exercise:yes  Flu 2019 PNA up to date   Shingles out of stock. Living will d/w pt. Daughter Wells Guiles designated if patient were incapacitated.  PSA and colon cancer screening declined due to age.  Still at [facility].  Exercising 5 times a week.    He is still dealing with the loss of his wife, d/w pt.   PMH and SH reviewed  ROS: Per HPI unless specifically indicated in ROS section   Meds, vitals, and allergies reviewed.   GEN: nad, alert and oriented HEENT: mucous membranes moist NECK: supple w/o LA CV: rrr.  no murmur PULM: ctab, no inc wob ABD: soft, +bs EXT: no edema SKIN: no acute rash  A&Ox3  Can do math.  Normal attention and recall  Can read a watch.

## 2018-02-25 DIAGNOSIS — Z0000 Encounter for general adult medical examination without abnormal findings: Secondary | ICD-10-CM | POA: Insufficient documentation

## 2018-02-25 DIAGNOSIS — K219 Gastro-esophageal reflux disease without esophagitis: Secondary | ICD-10-CM | POA: Insufficient documentation

## 2018-02-25 DIAGNOSIS — H539 Unspecified visual disturbance: Secondary | ICD-10-CM | POA: Insufficient documentation

## 2018-02-25 NOTE — Assessment & Plan Note (Signed)
He has episodic, brief, recurrent change in vision in one eye without focal neurologic symptoms otherwise.  He has no loss of vision.  I do not know if this is ocular migraine.  I want him to follow-up with the eye clinic.  He agrees.

## 2018-02-25 NOTE — Assessment & Plan Note (Signed)
This may have contributed to his previous memory changes. 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  It seems that he is improved.  He has brief testing today is improved compared to previous.  I want him to discuss with his family and then update me.  He agrees about that He had previously documented low B12 level with subsequent improvement in the meantime.  Would continue as is for now.  Continue B12 injection monthly.  Recheck labs at office visit in about 6 months where we can recheck his memory.  Based on my conversation with him today, his recent performance, and his improved recall today, it does seem that the patient is clinically improved.

## 2018-02-25 NOTE — Assessment & Plan Note (Signed)
Unclear if his B12 malabsorption/deficiency is related to PPI use.  He clearly felt worse when he was off PPI, with more reflux.  Would continue PPI for now after discussion with patient.  Continue B12 supplementation.

## 2018-02-25 NOTE — Assessment & Plan Note (Signed)
Flu 2019 PNA up to date   Shingles out of stock. Living will d/w pt. Daughter Wells Guiles designated if patient were incapacitated.  PSA and colon cancer screening declined due to age.  Still at [facility].  Exercising 5 times a week.

## 2018-02-25 NOTE — Assessment & Plan Note (Signed)
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  It seems that he is improved.  He has brief testing today is improved compared to previous.  I want him to discuss with his family and then update me.  He agrees about that.  See B12 discussion.

## 2018-02-25 NOTE — Assessment & Plan Note (Signed)
Continue as is.  He agrees.  Labs discussed with patient.  LDL at goal.

## 2018-02-25 NOTE — Assessment & Plan Note (Signed)
Living will d/w pt. Daughter Wells Guiles designated if patient were incapacitated.

## 2018-02-25 NOTE — Assessment & Plan Note (Signed)
No chest pain.  Continue as is.  He agrees.  Labs discussed with patient.  LDL at goal.

## 2018-02-27 DIAGNOSIS — G43109 Migraine with aura, not intractable, without status migrainosus: Secondary | ICD-10-CM | POA: Diagnosis not present

## 2018-03-31 ENCOUNTER — Emergency Department: Payer: Medicare Other

## 2018-03-31 ENCOUNTER — Encounter
Admission: EM | Disposition: A | Discharge status: Home / Self Care | Payer: Self-pay | Source: Home / Self Care | Attending: Internal Medicine

## 2018-03-31 ENCOUNTER — Inpatient Hospital Stay
Admission: EM | Admit: 2018-03-31 | Discharge: 2018-04-02 | DRG: 281 | Disposition: A | Discharge status: Home / Self Care | Payer: Medicare Other | Attending: Internal Medicine | Admitting: Internal Medicine

## 2018-03-31 ENCOUNTER — Other Ambulatory Visit: Payer: Self-pay

## 2018-03-31 ENCOUNTER — Inpatient Hospital Stay (HOSPITAL_COMMUNITY)
Admit: 2018-03-31 | Discharge: 2018-03-31 | Disposition: A | Discharge status: Home / Self Care | Payer: Medicare Other | Attending: Internal Medicine | Admitting: Internal Medicine

## 2018-03-31 DIAGNOSIS — Z85820 Personal history of malignant melanoma of skin: Secondary | ICD-10-CM | POA: Diagnosis not present

## 2018-03-31 DIAGNOSIS — Z955 Presence of coronary angioplasty implant and graft: Secondary | ICD-10-CM

## 2018-03-31 DIAGNOSIS — I214 Non-ST elevation (NSTEMI) myocardial infarction: Principal | ICD-10-CM | POA: Diagnosis present

## 2018-03-31 DIAGNOSIS — R011 Cardiac murmur, unspecified: Secondary | ICD-10-CM | POA: Diagnosis present

## 2018-03-31 DIAGNOSIS — I255 Ischemic cardiomyopathy: Secondary | ICD-10-CM | POA: Diagnosis not present

## 2018-03-31 DIAGNOSIS — Z8673 Personal history of transient ischemic attack (TIA), and cerebral infarction without residual deficits: Secondary | ICD-10-CM

## 2018-03-31 DIAGNOSIS — E785 Hyperlipidemia, unspecified: Secondary | ICD-10-CM | POA: Diagnosis present

## 2018-03-31 DIAGNOSIS — I340 Nonrheumatic mitral (valve) insufficiency: Secondary | ICD-10-CM

## 2018-03-31 DIAGNOSIS — N400 Enlarged prostate without lower urinary tract symptoms: Secondary | ICD-10-CM | POA: Diagnosis present

## 2018-03-31 DIAGNOSIS — K449 Diaphragmatic hernia without obstruction or gangrene: Secondary | ICD-10-CM | POA: Diagnosis present

## 2018-03-31 DIAGNOSIS — I10 Essential (primary) hypertension: Secondary | ICD-10-CM | POA: Diagnosis not present

## 2018-03-31 DIAGNOSIS — Z823 Family history of stroke: Secondary | ICD-10-CM | POA: Diagnosis not present

## 2018-03-31 DIAGNOSIS — T82898A Other specified complication of vascular prosthetic devices, implants and grafts, initial encounter: Secondary | ICD-10-CM | POA: Diagnosis present

## 2018-03-31 DIAGNOSIS — Z800 Family history of malignant neoplasm of digestive organs: Secondary | ICD-10-CM

## 2018-03-31 DIAGNOSIS — Z66 Do not resuscitate: Secondary | ICD-10-CM | POA: Diagnosis present

## 2018-03-31 DIAGNOSIS — I739 Peripheral vascular disease, unspecified: Secondary | ICD-10-CM | POA: Diagnosis not present

## 2018-03-31 DIAGNOSIS — T82855A Stenosis of coronary artery stent, initial encounter: Secondary | ICD-10-CM | POA: Diagnosis present

## 2018-03-31 DIAGNOSIS — E7800 Pure hypercholesterolemia, unspecified: Secondary | ICD-10-CM | POA: Diagnosis present

## 2018-03-31 DIAGNOSIS — Z888 Allergy status to other drugs, medicaments and biological substances status: Secondary | ICD-10-CM

## 2018-03-31 DIAGNOSIS — R079 Chest pain, unspecified: Secondary | ICD-10-CM | POA: Diagnosis not present

## 2018-03-31 DIAGNOSIS — I25810 Atherosclerosis of coronary artery bypass graft(s) without angina pectoris: Secondary | ICD-10-CM | POA: Diagnosis present

## 2018-03-31 DIAGNOSIS — K219 Gastro-esophageal reflux disease without esophagitis: Secondary | ICD-10-CM | POA: Diagnosis present

## 2018-03-31 DIAGNOSIS — Z7902 Long term (current) use of antithrombotics/antiplatelets: Secondary | ICD-10-CM

## 2018-03-31 DIAGNOSIS — I2510 Atherosclerotic heart disease of native coronary artery without angina pectoris: Secondary | ICD-10-CM | POA: Diagnosis not present

## 2018-03-31 DIAGNOSIS — Z803 Family history of malignant neoplasm of breast: Secondary | ICD-10-CM | POA: Diagnosis not present

## 2018-03-31 HISTORY — PX: LEFT HEART CATH AND CORS/GRAFTS ANGIOGRAPHY: CATH118250

## 2018-03-31 LAB — PROTIME-INR
INR: 1
Prothrombin Time: 13.1 seconds (ref 11.4–15.2)

## 2018-03-31 LAB — CBC
HCT: 41.9 % (ref 39.0–52.0)
HEMOGLOBIN: 13.8 g/dL (ref 13.0–17.0)
MCH: 30.6 pg (ref 26.0–34.0)
MCHC: 32.9 g/dL (ref 30.0–36.0)
MCV: 92.9 fL (ref 80.0–100.0)
Platelets: 210 10*3/uL (ref 150–400)
RBC: 4.51 MIL/uL (ref 4.22–5.81)
RDW: 12.5 % (ref 11.5–15.5)
WBC: 7.1 10*3/uL (ref 4.0–10.5)
nRBC: 0 % (ref 0.0–0.2)

## 2018-03-31 LAB — LIPID PANEL
Cholesterol: 137 mg/dL (ref 0–200)
HDL: 42 mg/dL (ref >40)
LDL Cholesterol: 82 mg/dL (ref 0–99)
Total CHOL/HDL Ratio: 3.3 RATIO
Triglycerides: 64 mg/dL (ref <150)
VLDL: 13 mg/dL (ref 0–40)

## 2018-03-31 LAB — BASIC METABOLIC PANEL
Anion gap: 4 — ABNORMAL LOW (ref 5–15)
Anion gap: 3 — ABNORMAL LOW (ref 5–15)
BUN: 11 mg/dL (ref 8–23)
BUN: 10 mg/dL (ref 8–23)
CO2: 29 mmol/L (ref 22–32)
CO2: 29 mmol/L (ref 22–32)
Calcium: 8.5 mg/dL — ABNORMAL LOW (ref 8.9–10.3)
Calcium: 8.4 mg/dL — ABNORMAL LOW (ref 8.9–10.3)
Chloride: 102 mmol/L (ref 98–111)
Chloride: 103 mmol/L (ref 98–111)
Creatinine, Ser: 1.07 mg/dL (ref 0.61–1.24)
Creatinine, Ser: 1.01 mg/dL (ref 0.61–1.24)
GFR calc Af Amer: >60 mL/min (ref >60)
GFR calc Af Amer: >60 mL/min (ref >60)
GFR calc non Af Amer: >60 mL/min (ref >60)
Glucose, Bld: 110 mg/dL — ABNORMAL HIGH (ref 70–99)
Glucose, Bld: 110 mg/dL — ABNORMAL HIGH (ref 70–99)
POTASSIUM: 4.1 mmol/L (ref 3.5–5.1)
POTASSIUM: 4.2 mmol/L (ref 3.5–5.1)
Sodium: 135 mmol/L (ref 135–145)
Sodium: 135 mmol/L (ref 135–145)

## 2018-03-31 LAB — CBC WITH DIFFERENTIAL/PLATELET
ABS IMMATURE GRANULOCYTES: 0.04 10*3/uL (ref 0.00–0.07)
Basophils Absolute: 0.1 10*3/uL (ref 0.0–0.1)
Basophils Relative: 1 %
Eosinophils Absolute: 0.2 10*3/uL (ref 0.0–0.5)
Eosinophils Relative: 3 %
HCT: 43.2 % (ref 39.0–52.0)
Hemoglobin: 14.2 g/dL (ref 13.0–17.0)
Immature Granulocytes: 1 %
Lymphocytes Relative: 23 %
Lymphs Abs: 1.6 10*3/uL (ref 0.7–4.0)
MCH: 30.2 pg (ref 26.0–34.0)
MCHC: 32.9 g/dL (ref 30.0–36.0)
MCV: 91.9 fL (ref 80.0–100.0)
MONOS PCT: 10 %
Monocytes Absolute: 0.7 10*3/uL (ref 0.1–1.0)
NEUTROS PCT: 62 %
Neutro Abs: 4.2 10*3/uL (ref 1.7–7.7)
PLATELETS: 223 10*3/uL (ref 150–400)
RBC: 4.7 MIL/uL (ref 4.22–5.81)
RDW: 12.6 % (ref 11.5–15.5)
WBC: 6.7 10*3/uL (ref 4.0–10.5)
nRBC: 0 % (ref 0.0–0.2)

## 2018-03-31 LAB — TROPONIN I
TROPONIN I: 2.21 ng/mL — AB (ref <0.03)
Troponin I: 0.28 ng/mL (ref <0.03)
Troponin I: 0.3 ng/mL (ref <0.03)
Troponin I: 1.04 ng/mL (ref <0.03)

## 2018-03-31 LAB — APTT: aPTT: 33 seconds (ref 24–36)

## 2018-03-31 LAB — GLUCOSE, CAPILLARY: Glucose-Capillary: 97 mg/dL (ref 70–99)

## 2018-03-31 SURGERY — LEFT HEART CATH AND CORS/GRAFTS ANGIOGRAPHY
Anesthesia: Moderate Sedation

## 2018-03-31 MED ORDER — HEPARIN SODIUM (PORCINE) 1000 UNIT/ML IJ SOLN
INTRAMUSCULAR | Status: DC | PRN
  Administered 2018-03-31: 3000 [IU] via INTRAVENOUS

## 2018-03-31 MED ORDER — PANTOPRAZOLE SODIUM 40 MG PO TBEC
40.0000 mg | DELAYED_RELEASE_TABLET | Freq: Every day | ORAL | Status: DC
  Administered 2018-03-31 – 2018-04-02 (×3): 40 mg via ORAL
  Filled 2018-03-31 (×3): qty 1

## 2018-03-31 MED ORDER — HEPARIN (PORCINE) 25000 UT/250ML-% IV SOLN
850.0000 [IU]/h | INTRAVENOUS | Status: DC
  Administered 2018-03-31 – 2018-04-02 (×2): 850 [IU]/h via INTRAVENOUS
  Filled 2018-03-31 (×2): qty 250

## 2018-03-31 MED ORDER — FENTANYL CITRATE (PF) 100 MCG/2ML IJ SOLN
INTRAMUSCULAR | Status: AC
  Filled 2018-03-31: qty 2

## 2018-03-31 MED ORDER — SODIUM CHLORIDE 0.9% FLUSH: 3.0000 mL | INTRAVENOUS | Status: DC | PRN

## 2018-03-31 MED ORDER — ASPIRIN 81 MG PO CHEW
324.0000 mg | CHEWABLE_TABLET | Freq: Once | ORAL | Status: AC
  Administered 2018-03-31: 324 mg via ORAL

## 2018-03-31 MED ORDER — MIDAZOLAM HCL 2 MG/2ML IJ SOLN
INTRAMUSCULAR | Status: DC | PRN
  Administered 2018-03-31: 1 mg via INTRAVENOUS

## 2018-03-31 MED ORDER — MIDAZOLAM HCL 2 MG/2ML IJ SOLN
INTRAMUSCULAR | Status: AC
  Filled 2018-03-31: qty 2

## 2018-03-31 MED ORDER — SODIUM CHLORIDE 0.9 % IV SOLN: 250.0000 mL | INTRAVENOUS | Status: DC | PRN

## 2018-03-31 MED ORDER — SODIUM CHLORIDE 0.9 % IV SOLN
INTRAVENOUS | Status: DC
  Administered 2018-03-31: 06:00:00 via INTRAVENOUS

## 2018-03-31 MED ORDER — NITROGLYCERIN 2 % TD OINT
0.5000 [in_us] | TOPICAL_OINTMENT | Freq: Four times a day (QID) | TRANSDERMAL | Status: DC
  Administered 2018-03-31 – 2018-04-01 (×3): 0.5 [in_us] via TOPICAL
  Filled 2018-03-31 (×3): qty 1

## 2018-03-31 MED ORDER — ENOXAPARIN SODIUM 80 MG/0.8ML ~~LOC~~ SOLN
1.0000 mg/kg | Freq: Once | SUBCUTANEOUS | Status: AC
  Administered 2018-03-31: 75 mg via SUBCUTANEOUS
  Filled 2018-03-31: qty 0.8

## 2018-03-31 MED ORDER — HEPARIN (PORCINE) 25000 UT/250ML-% IV SOLN
850.0000 [IU]/h | INTRAVENOUS | Status: DC
  Filled 2018-03-31: qty 250

## 2018-03-31 MED ORDER — METOPROLOL SUCCINATE ER 50 MG PO TB24
25.0000 mg | ORAL_TABLET | Freq: Every day | ORAL | Status: DC
  Administered 2018-04-01 – 2018-04-02 (×2): 25 mg via ORAL
  Filled 2018-03-31 (×2): qty 1

## 2018-03-31 MED ORDER — ATENOLOL 25 MG PO TABS
25.0000 mg | ORAL_TABLET | Freq: Every day | ORAL | Status: DC
  Administered 2018-03-31: 25 mg via ORAL
  Filled 2018-03-31: qty 1

## 2018-03-31 MED ORDER — ASPIRIN EC 81 MG PO TBEC
81.0000 mg | DELAYED_RELEASE_TABLET | Freq: Every day | ORAL | Status: DC
  Administered 2018-03-31 – 2018-04-02 (×3): 81 mg via ORAL
  Filled 2018-03-31 (×3): qty 1

## 2018-03-31 MED ORDER — SODIUM CHLORIDE 0.9% FLUSH
3.0000 mL | Freq: Two times a day (BID) | INTRAVENOUS | Status: DC
  Administered 2018-04-01 (×2): 3 mL via INTRAVENOUS

## 2018-03-31 MED ORDER — POLYETHYLENE GLYCOL 3350 17 G PO PACK
17.0000 g | PACK | Freq: Every day | ORAL | Status: DC
  Administered 2018-04-01 – 2018-04-02 (×2): 17 g via ORAL
  Filled 2018-03-31 (×3): qty 1

## 2018-03-31 MED ORDER — HEPARIN (PORCINE) IN NACL 1000-0.9 UT/500ML-% IV SOLN
INTRAVENOUS | Status: DC | PRN
  Administered 2018-03-31: 1000 mL

## 2018-03-31 MED ORDER — FENTANYL CITRATE (PF) 100 MCG/2ML IJ SOLN
INTRAMUSCULAR | Status: DC | PRN
  Administered 2018-03-31: 25 ug via INTRAVENOUS

## 2018-03-31 MED ORDER — NITROGLYCERIN 0.4 MG SL SUBL
0.4000 mg | SUBLINGUAL_TABLET | SUBLINGUAL | Status: DC | PRN
  Administered 2018-03-31 (×3): 0.4 mg via SUBLINGUAL
  Filled 2018-03-31: qty 1

## 2018-03-31 MED ORDER — SODIUM CHLORIDE 0.9% FLUSH
3.0000 mL | Freq: Two times a day (BID) | INTRAVENOUS | Status: DC
  Administered 2018-03-31: 3 mL via INTRAVENOUS

## 2018-03-31 MED ORDER — ACETAMINOPHEN 325 MG PO TABS: 650.0000 mg | ORAL_TABLET | ORAL | Status: DC | PRN

## 2018-03-31 MED ORDER — VERAPAMIL HCL 2.5 MG/ML IV SOLN
INTRAVENOUS | Status: DC | PRN
  Administered 2018-03-31: 2.5 mg via INTRA_ARTERIAL

## 2018-03-31 MED ORDER — ENOXAPARIN SODIUM 80 MG/0.8ML ~~LOC~~ SOLN
1.0000 mg/kg | Freq: Two times a day (BID) | SUBCUTANEOUS | Status: DC
  Filled 2018-03-31: qty 0.8

## 2018-03-31 MED ORDER — IOPAMIDOL (ISOVUE-300) INJECTION 61%
INTRAVENOUS | Status: DC | PRN
  Administered 2018-03-31: 125 mL via INTRA_ARTERIAL

## 2018-03-31 MED ORDER — ONDANSETRON HCL 4 MG/2ML IJ SOLN: 4.0000 mg | Freq: Four times a day (QID) | INTRAMUSCULAR | Status: DC | PRN

## 2018-03-31 MED ORDER — HEPARIN BOLUS VIA INFUSION
4000.0000 [IU] | Freq: Once | INTRAVENOUS | Status: DC
  Filled 2018-03-31: qty 4000

## 2018-03-31 MED ORDER — ASPIRIN 81 MG PO CHEW
CHEWABLE_TABLET | ORAL | Status: AC
  Filled 2018-03-31: qty 4

## 2018-03-31 MED ORDER — SODIUM CHLORIDE 0.9 % IV SOLN: INTRAVENOUS | Status: DC

## 2018-03-31 MED ORDER — HEPARIN SODIUM (PORCINE) 1000 UNIT/ML IJ SOLN
INTRAMUSCULAR | Status: AC
  Filled 2018-03-31: qty 1

## 2018-03-31 MED ORDER — ATORVASTATIN CALCIUM 20 MG PO TABS
40.0000 mg | ORAL_TABLET | Freq: Every day | ORAL | Status: DC
  Administered 2018-03-31 – 2018-04-01 (×2): 40 mg via ORAL
  Filled 2018-03-31: qty 4
  Filled 2018-03-31: qty 2

## 2018-03-31 MED ORDER — ATORVASTATIN CALCIUM 20 MG PO TABS: 20.0000 mg | ORAL_TABLET | Freq: Every day | ORAL | Status: DC

## 2018-03-31 MED ORDER — ASPIRIN 81 MG PO CHEW: 81.0000 mg | CHEWABLE_TABLET | ORAL | Status: DC

## 2018-03-31 MED ORDER — CLOPIDOGREL BISULFATE 75 MG PO TABS
75.0000 mg | ORAL_TABLET | Freq: Every day | ORAL | Status: DC
  Administered 2018-03-31 – 2018-04-02 (×3): 75 mg via ORAL
  Filled 2018-03-31 (×3): qty 1

## 2018-03-31 MED ORDER — VERAPAMIL HCL 2.5 MG/ML IV SOLN
INTRAVENOUS | Status: AC
  Filled 2018-03-31: qty 2

## 2018-03-31 MED ORDER — HEPARIN (PORCINE) IN NACL 1000-0.9 UT/500ML-% IV SOLN
INTRAVENOUS | Status: AC
  Filled 2018-03-31: qty 1000

## 2018-03-31 MED ORDER — SODIUM CHLORIDE 0.9 % IV SOLN
INTRAVENOUS | Status: AC
  Administered 2018-03-31 (×2): via INTRAVENOUS

## 2018-03-31 SURGICAL SUPPLY — 11 items
CATH INFINITI 5 FR IM (CATHETERS) ×1 IMPLANT
CATH INFINITI 5 FR MPA2 (CATHETERS) ×1 IMPLANT
CATH INFINITI 5FR ANG PIGTAIL (CATHETERS) ×1 IMPLANT
CATH INFINITI 5FR JL4 (CATHETERS) ×1 IMPLANT
CATH INFINITI JR4 5F (CATHETERS) ×1 IMPLANT
DEVICE RAD TR BAND REGULAR (VASCULAR PRODUCTS) ×1 IMPLANT
GLIDESHEATH SLEND SS 6F .021 (SHEATH) ×1 IMPLANT
KIT MANI 3VAL PERCEP (MISCELLANEOUS) ×2 IMPLANT
PACK CARDIAC CATH (CUSTOM PROCEDURE TRAY) ×2 IMPLANT
WIRE HITORQ VERSACORE ST 145CM (WIRE) ×1 IMPLANT
WIRE ROSEN-J .035X260CM (WIRE) ×1 IMPLANT

## 2018-03-31 NOTE — H&P (Signed)
[facility] [place] NAME: Jason Velazquez    MR#:  [id number]  DATE OF BIRTH:  May 09, 1928  DATE OF ADMISSION:  03/31/2018  PRIMARY CARE PHYSICIAN: Tonia Ghent, MD   REQUESTING/REFERRING PHYSICIAN: Dr. Jodell Cipro  CHIEF COMPLAINT:   Chief Complaint  Patient presents with  . Chest Pain    HISTORY OF PRESENT ILLNESS:  Jason Velazquez  is a 85 y.o. male with a known history listed below presented to emergency room for evaluation of chest pain.  Patient woke up around 1 AM with central chest pain radiation to neck.  Patient denies chest tightness.  He felt sharp pain in his chest and it was uncomfortable.  He took 2 nitroglycerin and called his nurse.  Nurse activated rescue team.  By the time EMS arrived patient was chest pain free.  Patient also had mild nausea but denies shortness of breath or lightheadedness.  Currently he is asymptomatic.  In emergency room patient had EKG, chest x-ray and labs.  Labs showed elevated troponin.  EKG without ST elevation MI.  Patient received aspirin.  Case discussed with emergency room physician and patient started on therapeutic Lovenox.  Hospitalist team requested for admission.  PAST MEDICAL HISTORY:   Past Medical History:  Diagnosis Date  . Allergic rhinitis   . Benign prostatic hypertrophy   . Coronary artery disease CARDIOLOGIST -- DR Daneen Schick  . Duodenal diverticulum 2003  . GERD (gastroesophageal reflux disease)   . H/O hiatal hernia 2003  . Heart murmur   . Hepatic lesion 2004  . History of melanoma excision    FACE  . Hypercholesteremia   . Hypertension   . Left carotid stenosis    > 50%  PER DUPLEX  03-28-2011  . Mild mitral regurgitation   . Perianal fistula   . S/P CABG x 4    1988  . S/P drug eluting coronary stent placement    POST CABG--  STENTING 2000  AND RESTENTING 2006 FOR REINSTENT STENOSIS  . Stroke [facility])     PAST SURGICAL HISTORY:   Past Surgical History:    Procedure Laterality Date  . CATARACT EXTRACTION W/ INTRAOCULAR LENS  IMPLANT, BILATERAL    . CORONARY ANGIOPLASTY WITH STENT PLACEMENT  2000   PCI AND STENTING CIRCUMFLEX  . CORONARY ANGIOPLASTY WITH STENT PLACEMENT  04-30-2004  DR Daneen Schick   RE-INSTENT STENOSIS/  DRUG-ELUTING STENT OF PROXIMAL AND MID CIRCUMFLEX/ PCI MID CIRCUMFLEX THROUGH STENT STRUT/  WIDELY PATENT SAPHENOUS VEIN GRAFT AND  LIMA TO LAD GRAFT/ TOTAL OCCLUSION RIGHT CORONARY BEYOND THE POSTERIOR DESCENDING ARTERY BRANCH WITH 50% OSTIAL NARROWING/ TOTAL OCCLUSION OF LAD AT THE OSTIUM OF THE LEFT MAIN  . CORONARY ARTERY BYPASS GRAFT  1988   X5  . EVALUATION UNDER ANESTHESIA WITH ANAL FISTULECTOMY N/A 07/22/2012   Procedure: EXAM UNDER ANESTHESIA WITH ANAL fistulotomy;  Surgeon: Leighton Ruff, MD;  Location: [facility];  Service: General;  Laterality: N/A;  . SHOULDER ARTHROSCOPY WITH OPEN ROTATOR CUFF REPAIR Right 07-10-1999  . TONSILLECTOMY AND ADENOIDECTOMY  1944  . TRANSTHORACIC ECHOCARDIOGRAM  03-01-2011  DR Daneen Schick   NORMAL LVF AND LV SIZE/ MILD LEFT ATRIAL ENLARGEMENT/ GRADE II DIASTOLIC DYSFUNCTION WITH ELEVATED LEFT ATRIAL PRESSURE/ MILD TO MODERATE MR/ MILD TR  . TRANSURETHRAL RESECTION OF PROSTATE  04/09/2011   Procedure: TRANSURETHRAL RESECTION OF THE PROSTATE WITH GYRUS INSTRUMENTS;  Surgeon: Malka So, MD;  Location: [facility];  Service: Urology;  Laterality: N/A;    SOCIAL HISTORY:   Social History   Tobacco Use  . Smoking status: Never Smoker  . Smokeless tobacco: Never Used  Substance Use Topics  . Alcohol use: No    FAMILY HISTORY:   Family History  Problem Relation Age of Onset  . Breast cancer Mother   . Stroke Father   . Colon cancer Brother     DRUG ALLERGIES:   Allergies  Allergen Reactions  . Iodine Rash    Rash when applied to skin  . Niacin Itching  . Niacin And Related Hives and Other (See Comments)    FLUSHING  . Adhesive [Tape] Rash    REVIEW OF  SYSTEMS:   ROS -12 point review of system reviewed positive as per HPI otherwise negative  MEDICATIONS AT HOME:   Prior to Admission medications   Medication Sig Start Date End Date Taking? Authorizing Provider  atenolol (TENORMIN) 25 MG tablet Take 25 mg by mouth daily after breakfast.   Yes [provider]  atorvastatin (LIPITOR) 20 MG tablet Take 1 tablet (20 mg total) by mouth at bedtime. 02/24/18  Yes Tonia Ghent, MD  clopidogrel (PLAVIX) 75 MG tablet Take 75 mg by mouth daily. 08/25/13  Yes [provider]  cyanocobalamin (,VITAMIN B-12,) 1000 MCG/ML injection INJECT 1ML IM MONTHLY 02/24/18  Yes Tonia Ghent, MD  ketoconazole (NIZORAL) 2 % cream Apply 1 application topically 2 (two) times daily. 04/05/16  Yes Venia Carbon, MD  nitroGLYCERIN (NITROSTAT) 0.4 MG SL tablet Place 1 tablet (0.4 mg total) under the tongue every 5 (five) minutes as needed for chest pain. 05/04/17  Yes Tonia Ghent, MD  pantoprazole (PROTONIX) 40 MG tablet Take 1 tablet (40 mg total) by mouth daily. 02/24/18  Yes Tonia Ghent, MD  polyethylene glycol [facility] / Floria Raveling) packet Take 17 g by mouth daily.   Yes [provider]  docusate sodium (COLACE) 100 MG capsule Take 200 mg by mouth 2 (two) times daily.     [provider]  NEEDLE, DISP, 25 G 25G X 1" MISC by Does not apply route. Use as directed    [provider]  Syringe/Needle, Disp, (SYRINGE 3CC/25GX1") 25G X 1" 3 ML MISC by Does not apply route. Use as iinstructed    [provider]      VITAL SIGNS:  Blood pressure (!) 151/87, pulse 72, temperature 97.9 F (36.6 C), temperature source Oral, resp. rate 14, height 5\' 4"  (1.626 m), weight 73.5 kg, SpO2 97 %.  PHYSICAL EXAMINATION:  Physical Exam  GENERAL:  85 y.o.-year-old patient lying in the bed with no acute distress.  EYES: Pupils equal, round, reactive to light and accommodation. No scleral icterus. Extraocular muscles  intact.  HEENT: Head atraumatic, normocephalic. Oropharynx and nasopharynx clear.  NECK:  Supple, no jugular venous distention. No thyroid enlargement, no tenderness.  LUNGS: Normal breath sounds bilaterally, no wheezing, rales,rhonchi or crepitation. No use of accessory muscles of respiration.  CARDIOVASCULAR: S1, S2 normal. No murmurs, rubs, or gallops.  ABDOMEN: Soft, nontender, nondistended. Bowel sounds present. No organomegaly or mass.  EXTREMITIES: No pedal edema, cyanosis, or clubbing.  NEUROLOGIC: Cranial nerves II through XII are intact. Muscle strength 5/5 in all extremities. Sensation intact. Gait not checked.  PSYCHIATRIC: The patient is alert and oriented x 3.  SKIN: No obvious rash, lesion, or ulcer.   LABORATORY PANEL:   CBC Recent Labs  Lab 03/31/18 0223  WBC 6.7  HGB  14.2  HCT 43.2  PLT 223   ------------------------------------------------------------------------------------------------------------------  Chemistries  Recent Labs  Lab 03/31/18 0223  NA 135  K 4.1  CL 102  CO2 29  GLUCOSE 110*  BUN 11  CREATININE 1.07  CALCIUM 8.5*   ------------------------------------------------------------------------------------------------------------------  Cardiac Enzymes Recent Labs  Lab 03/31/18 0223  TROPONINI 0.28*   ------------------------------------------------------------------------------------------------------------------  RADIOLOGY:  Dg Chest 2 View  Result Date: 03/31/2018 CLINICAL DATA:  Central chest pain EXAM: CHEST - 2 VIEW COMPARISON:  09/21/2013 FINDINGS: The patient is status post CABG. Heart size is top normal in appearance. Mild aortic atherosclerosis with uncoiling. A double density over the cardiac silhouette may represent a hiatal hernia. The lungs are mildly hyperinflated without acute pulmonary consolidation or CHF. No effusion or pneumothorax. Degenerative change is noted the dorsal spine. IMPRESSION: No active cardiopulmonary  disease. Electronically Signed   By: Ashley Royalty M.D.   On: 03/31/2018 03:57   EKG: Sinus rhythm, no STEMI   IMPRESSION AND PLAN:   1.  Chest pain and NSTEMI: Patient started on baby aspirin and therapeutic Lovenox.  Continue home dose of Plavix, atenolol and statin.  Check lipid panel.  Cycle cardiac enzymes.  Monitor on telemetry.  Cardiology consult requested.  Follow-up recommendations.  Keep patient n.p.o. in case patient needs procedure.  PRN nitroglycerin.  Symptomatic treatment as indicated.  2.  History of coronary artery disease status post stents and CABG: Continue home medications.  Symptomatic treatment as indicated.  Cardiology consult requested.  Follow-up recommendation.  3.  Chronic other medical problems: Monitor.  Continue home medication as ordered  DVT prophylaxis: Therapeutic Lovenox GI prophylaxis: PPI  Case discussed with patient in detail.  Further treatment based on clinical course and specialist evaluation.  Estimated length of stay more than 2 midnights  All the records are reviewed and case discussed with ED provider. Management plans discussed with the patient, family and they are in agreement.  CODE STATUS: DNR  TOTAL TIME TAKING CARE OF THIS PATIENT: 45 minutes.    Sedalia Muta M.D on 03/31/2018 at 4:31 AM  Between 7am to 6pm - Pager - [phone]  After 6pm go to [email or web address] - Proofreader  [facility]  Office  [phone]  CC: Primary care physician; Tonia Ghent, MD

## 2018-03-31 NOTE — ED Notes (Signed)
EDP made aware of critical lab troponin 0.28. No orders at this time.

## 2018-03-31 NOTE — Progress Notes (Signed)
ANTICOAGULATION CONSULT NOTE - Initial Consult  Pharmacy Consult for heparin Indication: chest pain/ACS  Allergies  Allergen Reactions  . Iodine Rash    Rash when applied to skin  . Niacin Itching  . Niacin And Related Hives and Other (See Comments)    FLUSHING  . Adhesive [Tape] Rash    Patient Measurements: Height: 5\' 4"  (162.6 cm) Weight: 160 lb (72.6 kg) IBW/kg (Calculated) : 59.2 Heparin Dosing Weight: 72.6 kg  Vital Signs: Temp: 97.9 F (36.6 C) (02/18 0231) Temp Source: Oral (02/18 0231) BP: 127/83 (02/18 1000) Pulse Rate: 66 (02/18 1000)  Labs: Recent Labs    03/31/18 0223 03/31/18 0552  HGB 14.2 13.8  HCT 43.2 41.9  PLT 223 210  LABPROT  --  13.1  INR  --  1.00  CREATININE 1.07 1.01  TROPONINI 0.28* 0.30*    Estimated Creatinine Clearance: 47.1 mL/min (by C-G formula based on SCr of 1.01 mg/dL).   Medical History: Past Medical History:  Diagnosis Date  . Allergic rhinitis   . Benign prostatic hypertrophy   . Coronary artery disease CARDIOLOGIST -- DR Daneen Schick  . Duodenal diverticulum 2003  . GERD (gastroesophageal reflux disease)   . H/O hiatal hernia 2003  . Heart murmur   . Hepatic lesion 2004  . History of melanoma excision    FACE  . Hypercholesteremia   . Hypertension   . Left carotid stenosis    > 50%  PER DUPLEX  03-28-2011  . Mild mitral regurgitation   . Perianal fistula   . S/P CABG x 4    1988  . S/P drug eluting coronary stent placement    POST CABG--  STENTING 2000  AND RESTENTING 2006 FOR REINSTENT STENOSIS  . Stroke [facility])     Medications:  (Not in a hospital admission)  Scheduled:  . [START ON 04/01/2018] aspirin  81 mg Oral Pre-Cath  . aspirin EC  81 mg Oral Daily  . atenolol  25 mg Oral QPC breakfast  . atorvastatin  20 mg Oral QHS  . betamethasone acetate-betamethasone sodium phosphate  12 mg Intramuscular Once  . betamethasone acetate-betamethasone sodium phosphate  3 mg Intramuscular Once  . clopidogrel   75 mg Oral Daily  . enoxaparin (LOVENOX) injection  1 mg/kg Subcutaneous Q12H  . nitroGLYCERIN  0.5 inch Topical Q6H  . pantoprazole  40 mg Oral Daily  . polyethylene glycol  17 g Oral Daily  . sodium chloride flush  3 mL Intravenous Q12H   Infusions:  . sodium chloride 50 mL/hr at 03/31/18 0604  . sodium chloride    . sodium chloride     PRN: sodium chloride, acetaminophen, nitroGLYCERIN, ondansetron (ZOFRAN) IV, sodium chloride flush  Assessment: Pharmacy was consulted for heparin dosing for ACS. Trop elevated. No DOAC PTA noted   Goal of Therapy:  Heparin level 0.3-0.7 units/ml Monitor platelets by anticoagulation protocol: Yes   Plan:  Give 4000 units bolus x 1 Start heparin infusion at 850 units/hr Check anti-Xa level in 8 hours and daily while on heparin Continue to monitor H&H and platelets  [employer], PhjarmD, BCPS 03/31/2018,10:18 AM

## 2018-03-31 NOTE — ED Notes (Signed)
Pt assisted to toilet in room  

## 2018-03-31 NOTE — Consult Note (Signed)
Cardiology Consultation:   Patient ID: [name] MRN: [id number]; DOB: Mar 07, 1928  Admit date: 03/31/2018 Date of Consult: 03/31/2018  Primary Care Provider: Tonia Ghent, MD Primary Cardiologist: Previously Dr. Yvone Neu Primary Electrophysiologist:  None    Patient Profile:   Jason Velazquez is a 85 y.o. male with a hx of coronary artery disease status post remote CABG (LIMA to LAD, sequential SVG to D1 and D2, and SVG to RPL) and subsequent PCI to proximal LCx/OM, hypertension, hyperlipidemia, carotid artery stenosis with prior stroke and some residual memory deficit, and BPH who is being seen today for the evaluation of chest pain and elevated troponin at the request of Dr. Leslye Peer.  History of Present Illness:   [name] was in his usual state of health until 1 this morning when he had acute onset of chest pain that awoke him from sleep.  He describes the pain as a sharp, stabbing sensation in the center of his chest without radiation.  After persistent pain and some nausea, he contacted the nurse at [facility].  She advised him to come to the emergency department via EMS.  Here, he received sublingual nitroglycerin with improvement but not complete resolution of his chest pain.  Currently, he has 1-2/10 soreness in the center of his chest.  He does not have any other symptoms including shortness of breath, palpitations, lightheadedness, and nausea.  He notes chronic lower extremity edema, which is unchanged from baseline.  Mr. Mickelson has been compliant with his medications, including long-term antiplatelet therapy with clopidogrel.  He is typically very active, exercising multiple days a week without any limitations.  He was last seen in our office by Dr. Farrel Conners in 06/2016.  His last cardiac catheterization was in 2006 in the setting of NSTEMI.  He was found to have high-grade in-stent restenosis involving the LCx stent.  He went stenting with a Cypher DES to the in-stent restenosis  involving the old bare-metal stent by Dr. Tamala Julian.  He notes that his chest pain this morning is reminiscent of what he felt leading up to that catheterization.  Past Medical History:  Diagnosis Date  . Allergic rhinitis   . Benign prostatic hypertrophy   . Coronary artery disease CARDIOLOGIST -- DR Daneen Schick  . Duodenal diverticulum 2003  . GERD (gastroesophageal reflux disease)   . H/O hiatal hernia 2003  . Heart murmur   . Hepatic lesion 2004  . History of melanoma excision    FACE  . Hypercholesteremia   . Hypertension   . Left carotid stenosis    > 50%  PER DUPLEX  03-28-2011  . Mild mitral regurgitation   . Perianal fistula   . S/P CABG x 4    1988  . S/P drug eluting coronary stent placement    POST CABG--  STENTING 2000  AND RESTENTING 2006 FOR REINSTENT STENOSIS  . Stroke [facility])     Past Surgical History:  Procedure Laterality Date  . CATARACT EXTRACTION W/ INTRAOCULAR LENS  IMPLANT, BILATERAL    . CORONARY ANGIOPLASTY WITH STENT PLACEMENT  2000   PCI AND STENTING CIRCUMFLEX  . CORONARY ANGIOPLASTY WITH STENT PLACEMENT  04-30-2004  DR Daneen Schick   RE-INSTENT STENOSIS/  DRUG-ELUTING STENT OF PROXIMAL AND MID CIRCUMFLEX/ PCI MID CIRCUMFLEX THROUGH STENT STRUT/  WIDELY PATENT SAPHENOUS VEIN GRAFT AND  LIMA TO LAD GRAFT/ TOTAL OCCLUSION RIGHT CORONARY BEYOND THE POSTERIOR DESCENDING ARTERY BRANCH WITH 50% OSTIAL NARROWING/ TOTAL OCCLUSION OF LAD AT THE OSTIUM OF THE LEFT  MAIN  . CORONARY ARTERY BYPASS GRAFT  1988   X5  . EVALUATION UNDER ANESTHESIA WITH ANAL FISTULECTOMY N/A 07/22/2012   Procedure: EXAM UNDER ANESTHESIA WITH ANAL fistulotomy;  Surgeon: Leighton Ruff, MD;  Location: [place];  Service: General;  Laterality: N/A;  . SHOULDER ARTHROSCOPY WITH OPEN ROTATOR CUFF REPAIR Right 07-10-1999  . TONSILLECTOMY AND ADENOIDECTOMY  1944  . TRANSTHORACIC ECHOCARDIOGRAM  03-01-2011  DR Daneen Schick   NORMAL LVF AND LV SIZE/ MILD LEFT ATRIAL ENLARGEMENT/  GRADE II DIASTOLIC DYSFUNCTION WITH ELEVATED LEFT ATRIAL PRESSURE/ MILD TO MODERATE MR/ MILD TR  . TRANSURETHRAL RESECTION OF PROSTATE  04/09/2011   Procedure: TRANSURETHRAL RESECTION OF THE PROSTATE WITH GYRUS INSTRUMENTS;  Surgeon: Malka So, MD;  Location: [facility];  Service: Urology;  Laterality: N/A;     Home Medications:  Prior to Admission medications   Medication Sig Start Date Samaira Holzworth Date Taking? Authorizing Provider  atenolol (TENORMIN) 25 MG tablet Take 25 mg by mouth daily after breakfast.   Yes [provider]  atorvastatin (LIPITOR) 20 MG tablet Take 1 tablet (20 mg total) by mouth at bedtime. 02/24/18  Yes Tonia Ghent, MD  clopidogrel (PLAVIX) 75 MG tablet Take 75 mg by mouth daily. 08/25/13  Yes [provider]  cyanocobalamin (,VITAMIN B-12,) 1000 MCG/ML injection INJECT 1ML IM MONTHLY 02/24/18  Yes Tonia Ghent, MD  ketoconazole (NIZORAL) 2 % cream Apply 1 application topically 2 (two) times daily. 04/05/16  Yes Venia Carbon, MD  nitroGLYCERIN (NITROSTAT) 0.4 MG SL tablet Place 1 tablet (0.4 mg total) under the tongue every 5 (five) minutes as needed for chest pain. 05/04/17  Yes Tonia Ghent, MD  pantoprazole (PROTONIX) 40 MG tablet Take 1 tablet (40 mg total) by mouth daily. 02/24/18  Yes Tonia Ghent, MD  polyethylene glycol [facility] Floria Raveling) packet Take 17 g by mouth daily.   Yes [provider]  docusate sodium (COLACE) 100 MG capsule Take 200 mg by mouth 2 (two) times daily.     [provider]  NEEDLE, DISP, 25 G 25G X 1" MISC by Does not apply route. Use as directed    [provider]  Syringe/Needle, Disp, (SYRINGE 3CC/25GX1") 25G X 1" 3 ML MISC by Does not apply route. Use as iinstructed    [provider]    Inpatient Medications: Scheduled Meds: . [START ON 04/01/2018] aspirin  81 mg Oral Pre-Cath  . aspirin EC  81 mg Oral Daily  . atenolol  25 mg Oral QPC breakfast  . atorvastatin  20 mg Oral  QHS  . betamethasone acetate-betamethasone sodium phosphate  12 mg Intramuscular Once  . betamethasone acetate-betamethasone sodium phosphate  3 mg Intramuscular Once  . clopidogrel  75 mg Oral Daily  . enoxaparin (LOVENOX) injection  1 mg/kg Subcutaneous Q12H  . nitroGLYCERIN  0.5 inch Topical Q6H  . pantoprazole  40 mg Oral Daily  . polyethylene glycol  17 g Oral Daily  . sodium chloride flush  3 mL Intravenous Q12H   Continuous Infusions: . sodium chloride 50 mL/hr at 03/31/18 0604  . sodium chloride    . sodium chloride     PRN Meds: sodium chloride, acetaminophen, nitroGLYCERIN, ondansetron (ZOFRAN) IV, sodium chloride flush  Allergies:    Allergies  Allergen Reactions  . Iodine Rash    Rash when applied to skin  . Niacin Itching  . Niacin And Related Hives and Other (See Comments)    FLUSHING  .  Adhesive [Tape] Rash    Social History:   Social History   Socioeconomic History  . Marital status: Widowed    Spouse name: [name]  . Number of children: 3  . Years of education: B.D.  . Highest education level: Not on file  Occupational History  . Occupation: retired  Scientific laboratory technician  . Financial resource strain: Not on file  . Food insecurity:    Worry: Not on file    Inability: Not on file  . Transportation needs:    Medical: Not on file    Non-medical: Not on file  Tobacco Use  . Smoking status: Never Smoker  . Smokeless tobacco: Never Used  Substance and Sexual Activity  . Alcohol use: No  . Drug use: No  . Sexual activity: Not Currently  Lifestyle  . Physical activity:    Days per week: Not on file    Minutes per session: Not on file  . Stress: Not on file  Relationships  . Social connections:    Talks on phone: Not on file    Gets together: Not on file    Attends religious service: Not on file    Active member of club or organization: Not on file    Attends meetings of clubs or organizations: Not on file    Relationship status: Not on file  .  Intimate partner violence:    Fear of current or ex partner: Not on file    Emotionally abused: Not on file    Physically abused: Not on file    Forced sexual activity: Not on file  Other Topics Concern  . Not on file  Social History Narrative   Lives alone, at [facility].  Has some help with housecleaning but o/w he is independent.     Was married to first wife 53 years, then to second wife 12 years.  Widowed twice.        Likes to travel.     Retired Theme park manager, [employer]   One son was killed in [place] in 1984   One son and one daughter (she is an Therapist, sports) still alive in 2019    Family History:    Family History  Problem Relation Age of Onset  . Breast cancer Mother   . Stroke Father   . Colon cancer Brother      ROS:  Please see the history of present illness. All other ROS reviewed and negative.     Physical Exam/Data:   Vitals:   03/31/18 0705 03/31/18 0830 03/31/18 0900 03/31/18 0940  BP: (!) 159/103 (!) 152/82 (!) 170/96 132/77  Pulse: 77 69 71 65  Resp: 17 19 11  (!) 22  Temp:      TempSrc:      SpO2: 97% 94% 94% 94%  Weight:      Height:       No intake or output data in the 24 hours ending 03/31/18 1009 Last 3 Weights 03/31/2018 02/24/2018 02/19/2018  Weight (lbs) 162 lb 183 lb 8 oz 178 lb 8 oz  Weight (kg) 73.483 kg 83.235 kg 80.967 kg     Body mass index is 27.81 kg/m.  General:  Well nourished, well developed, in no acute distress.  He is accompanied by his son and daughter. HEENT: normal Lymph: no adenopathy Neck: no JVD Endocrine:  No thryomegaly Vascular: No carotid bruits; 2+ radial and pedal pulses bilaterally. Cardiac: Distant heart sounds.  Regular rate and rhythm without murmurs. Lungs:  clear  to auscultation bilaterally, no wheezing, rhonchi or rales  Abd: soft, nontender, no hepatomegaly  Ext: 1+ ankle edema bilaterally. Musculoskeletal:  No deformities, BUE and BLE strength normal and equal Skin: warm and dry  Neuro:  CNs 2-12  intact, no focal abnormalities noted Psych:  Normal affect   EKG:  The EKG was personally reviewed and demonstrates: Normal sinus rhythm with PACs and nonspecific ST segment changes.  Relevant CV Studies: TTE (12/21/2012): Normal LV size.  Mild LVH.  LVEF 55 to 60% with normal wall motion.  Grade 1 diastolic dysfunction.  Mild left atrial enlargement.  Normal RV size and function.  Laboratory Data:  Chemistry Recent Labs  Lab 03/31/18 0223 03/31/18 0552  NA 135 135  K 4.1 4.2  CL 102 103  CO2 29 29  GLUCOSE 110* 110*  BUN 11 10  CREATININE 1.07 1.01  CALCIUM 8.5* 8.4*  GFRNONAA >60 >60  GFRAA >60 >60  ANIONGAP 4* 3*    No results for input(s): PROT, ALBUMIN, AST, ALT, ALKPHOS, BILITOT in the last 168 hours. Hematology Recent Labs  Lab 03/31/18 0223 03/31/18 0552  WBC 6.7 7.1  RBC 4.70 4.51  HGB 14.2 13.8  HCT 43.2 41.9  MCV 91.9 92.9  MCH 30.2 30.6  MCHC 32.9 32.9  RDW 12.6 12.5  PLT 223 210   Cardiac Enzymes Recent Labs  Lab 03/31/18 0223 03/31/18 0552  TROPONINI 0.28* 0.30*   No results for input(s): TROPIPOC in the last 168 hours.  BNPNo results for input(s): BNP, PROBNP in the last 168 hours.  DDimer No results for input(s): DDIMER in the last 168 hours.  Radiology/Studies:  Dg Chest 2 View  Result Date: 03/31/2018 CLINICAL DATA:  Central chest pain EXAM: CHEST - 2 VIEW COMPARISON:  09/21/2013 FINDINGS: The patient is status post CABG. Heart size is top normal in appearance. Mild aortic atherosclerosis with uncoiling. A double density over the cardiac silhouette may represent a hiatal hernia. The lungs are mildly hyperinflated without acute pulmonary consolidation or CHF. No effusion or pneumothorax. Degenerative change is noted the dorsal spine. IMPRESSION: No active cardiopulmonary disease. Electronically Signed   By: Ashley Royalty M.D.   On: 03/31/2018 03:57    Assessment and Plan:   NSTEMI Symptoms and mild troponin elevation are concerning for  NSTEMI.  Given continued chest pain, albeit mild, I have recommended early invasive strategy with cardiac catheterization today.  I have discussed temporary suspension of the DNR order, which Mr. Cada is in agreement with.  I have reviewed the risks, indications, and alternatives to cardiac catheterization, possible angioplasty, and stenting with the patient. Risks include but are not limited to bleeding, infection, vascular injury, stroke, myocardial infection, arrhythmia, kidney injury, radiation-related injury in the case of prolonged fluoroscopy use, emergency cardiac surgery, and death. The patient understands the risks of serious complication is 1-2 in 7253 with diagnostic cardiac cath and 1-2% or less with angioplasty/stenting.  Patient received therapeutic dose enoxaparin this morning by EDP.  Will defer adding heparin at this time.  If he has worsening chest pain, nitroglycerin infusion will need to be considered.  We will plan to continue aspirin and clopidogrel as well as statin therapy for secondary prevention.  Echocardiogram is pending.  Hypertension Blood pressure elevated.  Consider adding nitroglycerin infusion, particularly if chest pain does not improve or worsens.  Hyperlipidemia Patient on atorvastatin 20 mg daily at home.  LDL today not at goal (82).  Will increase atorvastatin to 40 mg daily.  For questions or updates, please contact [place] Please consult [email or web address] for contact info under [facility] Cardiology.  Signed, Nelva Bush, MD  03/31/2018 10:09 AM

## 2018-03-31 NOTE — Progress Notes (Signed)
ANTICOAGULATION CONSULT NOTE - Initial Consult  Pharmacy Consult for Lovenox dosing Indication: chest pain/ACS  Allergies  Allergen Reactions  . Iodine Rash    Rash when applied to skin  . Niacin Itching  . Niacin And Related Hives and Other (See Comments)    FLUSHING  . Adhesive [Tape] Rash    Patient Measurements: Height: 5\' 4"  (162.6 cm) Weight: 162 lb (73.5 kg) IBW/kg (Calculated) : 59.2 Enoxaparin Dosing Weight: 74 kg  Vital Signs: Temp: 97.9 F (36.6 C) (02/18 0231) Temp Source: Oral (02/18 0231) BP: 151/87 (02/18 0400) Pulse Rate: 72 (02/18 0400)  Labs: Recent Labs    03/31/18 0223  HGB 14.2  HCT 43.2  PLT 223  CREATININE 1.07  TROPONINI 0.28*    Estimated Creatinine Clearance: 44.6 mL/min (by C-G formula based on SCr of 1.07 mg/dL).   Medical History: Past Medical History:  Diagnosis Date  . Allergic rhinitis   . Benign prostatic hypertrophy   . Coronary artery disease CARDIOLOGIST -- DR Daneen Schick  . Duodenal diverticulum 2003  . GERD (gastroesophageal reflux disease)   . H/O hiatal hernia 2003  . Heart murmur   . Hepatic lesion 2004  . History of melanoma excision    FACE  . Hypercholesteremia   . Hypertension   . Left carotid stenosis    > 50%  PER DUPLEX  03-28-2011  . Mild mitral regurgitation   . Perianal fistula   . S/P CABG x 4    1988  . S/P drug eluting coronary stent placement    POST CABG--  STENTING 2000  AND RESTENTING 2006 FOR REINSTENT STENOSIS  . Stroke [facility])     Medications:  No anticoag in PTA meds  Assessment: Trop 0.28  Goal of Therapy:  Monitor platelets by anticoagulation protocol: Yes   Plan:  Lovenox 1 mg/kg q 12 hours ordered. F/u labs per protocol.  Sim Boast, PharmD, BCPS  03/31/18 4:34 AM

## 2018-03-31 NOTE — ED Notes (Addendum)
Repeat EKG performed and given to MD Alfred Levins and Admitting MD paged

## 2018-03-31 NOTE — Progress Notes (Signed)
Patient ID: Jason Velazquez, male   DOB: [birth year], 85 y.o.   MRN: [id number]  [facility] PROGRESS NOTE  Jason Velazquez MGQ:[id number] DOB: October 30, 1928 DOA: 03/31/2018 PCP: Tonia Ghent, MD  HPI/Subjective: Patient came in with chest pain.  Had more chest pain this morning.  When I saw him in the afternoon he was feeling better without pain.  Objective: Vitals:   03/31/18 1615 03/31/18 1630  BP: (!) 179/94   Pulse: 71 70  Resp: (!) 23   Temp:    SpO2: 98% 97%   No intake or output data in the 24 hours ending 03/31/18 1638 Filed Weights   03/31/18 0233 03/31/18 1009  Weight: 73.5 kg 72.6 kg    ROS: Review of Systems  Constitutional: Negative for chills and fever.  Eyes: Negative for blurred vision.  Respiratory: Negative for cough and shortness of breath.   Cardiovascular: Positive for chest pain.  Gastrointestinal: Negative for abdominal pain, constipation, diarrhea, nausea and vomiting.  Genitourinary: Negative for dysuria.  Musculoskeletal: Negative for joint pain.  Neurological: Negative for dizziness and headaches.   Exam: Physical Exam  Constitutional: He is oriented to person, place, and time.  HENT:  Nose: No mucosal edema.  Mouth/Throat: No oropharyngeal exudate or posterior oropharyngeal edema.  Eyes: Pupils are equal, round, and reactive to light. Conjunctivae, EOM and lids are normal.  Neck: No JVD present. Carotid bruit is not present. No edema present. No thyroid mass and no thyromegaly present.  Cardiovascular: S1 normal and S2 normal. Exam reveals no gallop.  No murmur heard. Pulses:      Dorsalis pedis pulses are 2+ on the right side and 2+ on the left side.  Respiratory: No respiratory distress. He has no wheezes. He has no rhonchi. He has no rales.  GI: Soft. Bowel sounds are normal. There is no abdominal tenderness.  Musculoskeletal:     Right ankle: He exhibits no swelling.     Left ankle: He exhibits no swelling.  Lymphadenopathy:    He  has no cervical adenopathy.  Neurological: He is alert and oriented to person, place, and time. No cranial nerve deficit.  Skin: Skin is warm. No rash noted. Nails show no clubbing.  Psychiatric: He has a normal mood and affect.      Data Reviewed: Basic Metabolic Panel: Recent Labs  Lab 03/31/18 0223 03/31/18 0552  NA 135 135  K 4.1 4.2  CL 102 103  CO2 29 29  GLUCOSE 110* 110*  BUN 11 10  CREATININE 1.07 1.01  CALCIUM 8.5* 8.4*   CBC: Recent Labs  Lab 03/31/18 0223 03/31/18 0552  WBC 6.7 7.1  NEUTROABS 4.2  --   HGB 14.2 13.8  HCT 43.2 41.9  MCV 91.9 92.9  PLT 223 210   Cardiac Enzymes: Recent Labs  Lab 03/31/18 0223 03/31/18 0552 03/31/18 1153  TROPONINI 0.28* 0.30* 1.04*     Studies: Dg Chest 2 View  Result Date: 03/31/2018 CLINICAL DATA:  Central chest pain EXAM: CHEST - 2 VIEW COMPARISON:  09/21/2013 FINDINGS: The patient is status post CABG. Heart size is top normal in appearance. Mild aortic atherosclerosis with uncoiling. A double density over the cardiac silhouette may represent a hiatal hernia. The lungs are mildly hyperinflated without acute pulmonary consolidation or CHF. No effusion or pneumothorax. Degenerative change is noted the dorsal spine. IMPRESSION: No active cardiopulmonary disease. Electronically Signed   By: Ashley Royalty M.D.   On: 03/31/2018 03:57    Scheduled Meds: . [facility]  Hold] aspirin EC  81 mg Oral Daily  . [MAR Hold] atorvastatin  40 mg Oral QHS  . [MAR Hold] clopidogrel  75 mg Oral Daily  . metoprolol succinate  25 mg Oral Daily  . [MAR Hold] nitroGLYCERIN  0.5 inch Topical Q6H  . [MAR Hold] pantoprazole  40 mg Oral Daily  . [MAR Hold] polyethylene glycol  17 g Oral Daily  . sodium chloride flush  3 mL Intravenous Q12H   Continuous Infusions: . sodium chloride 50 mL/hr at 03/31/18 1534  . sodium chloride    . heparin      Assessment/Plan:  1. NSTEMI with history of CAD.  Cardiac catheterization shows triple-vessel  disease.  Cardiology recommending medical management with aspirin, Plavix, atorvastatin and low-dose metoprolol. 2. History of CVA on aspirin, Plavix and atorvastatin 3. Peripheral vascular disease.  On aspirin, Plavix and statin 4. Essential hypertension atenolol switched over to metoprolol 5. GERD on PPI  Code Status:     Code Status Orders  (From admission, onward)         Start     Ordered   03/31/18 1552  Do not attempt resuscitation (DNR)  Continuous    Question Answer Comment  In the event of cardiac or respiratory ARREST Do not call a "code blue"   In the event of cardiac or respiratory ARREST Do not perform Intubation, CPR, defibrillation or ACLS   In the event of cardiac or respiratory ARREST Use medication by any route, position, wound care, and other measures to relive pain and suffering. May use oxygen, suction and manual treatment of airway obstruction as needed for comfort.      03/31/18 1551        Code Status History    Date Active Date Inactive Code Status Order ID Comments User Context   03/31/2018 5537 03/31/2018 1551 Full Code [id number]  Nelva Bush, MD Inpatient   03/31/2018 0546 03/31/2018 1403 DNR [id number]  Sedalia Muta, MD ED   05/26/2013 1259 05/27/2013 2148 Full Code [id number]  Donne Hazel, MD ED   04/09/2011 1031 04/11/2011 1648 Full Code [id number]  Jannifer Franklin, RN Inpatient    Advance Directive Documentation     Most Recent Value  Type of Advance Directive  Healthcare Power of Attorney, Living will  Pre-existing out of facility DNR order (yellow form or pink MOST form)  -  "MOST" Form in Place?  -     Family Communication: Family at bedside Disposition Plan: To be determined based on clinical course  Consultants:  Cardiology  Procedures:  Cardiac catheterization  Time spent: 37 minutes  [place]

## 2018-03-31 NOTE — ED Notes (Signed)
Patient's son at bedside with patient.

## 2018-03-31 NOTE — Interval H&P Note (Signed)
History and Physical Interval Note:  03/31/2018 2:03 PM  Jason Velazquez  has presented today for cardiac catheterization, with the diagnosis of NSTEMI  The various methods of treatment have been discussed with the patient and family. After consideration of risks, benefits and other options for treatment, the patient has consented to  Procedure(s): LEFT HEART CATH AND CORS/GRAFTS ANGIOGRAPHY (N/A) as a surgical intervention .  The patient's history has been reviewed, patient examined, no change in status, stable for surgery.  I have reviewed the patient's chart and labs.  Questions were answered to the patient's satisfaction.    Cath Lab Visit (complete for each Cath Lab visit)  Clinical Evaluation Leading to the Procedure:   ACS: Yes.    Non-ACS:  N/A  Alia Parsley

## 2018-03-31 NOTE — ED Notes (Signed)
Admitting MD at bedside.

## 2018-03-31 NOTE — ED Notes (Signed)
MD Wieting updated on pt's acute chest pain with no relief with 3 sublingual NTG, see MAR for new orders

## 2018-03-31 NOTE — ED Notes (Signed)
Report given to  [facility] in [facility] and will come to get patient shortly

## 2018-03-31 NOTE — ED Triage Notes (Signed)
Patient arrived from home at [place] by [employer]. Patient was awaken with central chest pain 8/10 and took nitro x3. Patient states that seemed to help. Pain went 8/10 to 0/10. Pt has hx of bypass and 2 stents placed. Patient states having lower extremities swelling. Patient has +1 pitting edema bilaterally lower extremities. Patient alert and oriented. Will continue to monitor.

## 2018-03-31 NOTE — H&P (View-Only) (Signed)
Cardiology Consultation:   Patient ID: Jason Velazquez MRN: [id number]; DOB: 25-Apr-1928  Admit date: 03/31/2018 Date of Consult: 03/31/2018  Primary Care Provider: Tonia Ghent, MD Primary Cardiologist: Previously Dr. Yvone Neu Primary Electrophysiologist:  None    Patient Profile:   Jason Velazquez is a 85 y.o. male with a hx of coronary artery disease status post remote CABG (LIMA to LAD, sequential SVG to D1 and D2, and SVG to RPL) and subsequent PCI to proximal LCx/OM, hypertension, hyperlipidemia, carotid artery stenosis with prior stroke and some residual memory deficit, and BPH who is being seen today for the evaluation of chest pain and elevated troponin at the request of Dr. Leslye Peer.  History of Present Illness:   Jason Velazquez was in his usual state of health until 1 this morning when he had acute onset of chest pain that awoke him from sleep.  He describes the pain as a sharp, stabbing sensation in the center of his chest without radiation.  After persistent pain and some nausea, he contacted the nurse at [facility].  She advised him to come to the emergency department via EMS.  Here, he received sublingual nitroglycerin with improvement but not complete resolution of his chest pain.  Currently, he has 1-2/10 soreness in the center of his chest.  He does not have any other symptoms including shortness of breath, palpitations, lightheadedness, and nausea.  He notes chronic lower extremity edema, which is unchanged from baseline.  Jason Velazquez has been compliant with his medications, including long-term antiplatelet therapy with clopidogrel.  He is typically very active, exercising multiple days a week without any limitations.  He was last seen in our office by Dr. Farrel Conners in 06/2016.  His last cardiac catheterization was in 2006 in the setting of NSTEMI.  He was found to have high-grade in-stent restenosis involving the LCx stent.  He went stenting with a Cypher DES to the in-stent restenosis  involving the old bare-metal stent by Dr. Tamala Julian.  He notes that his chest pain this morning is reminiscent of what he felt leading up to that catheterization.  Past Medical History:  Diagnosis Date  . Allergic rhinitis   . Benign prostatic hypertrophy   . Coronary artery disease CARDIOLOGIST -- DR Daneen Schick  . Duodenal diverticulum 2003  . GERD (gastroesophageal reflux disease)   . H/O hiatal hernia 2003  . Heart murmur   . Hepatic lesion 2004  . History of melanoma excision    FACE  . Hypercholesteremia   . Hypertension   . Left carotid stenosis    > 50%  PER DUPLEX  03-28-2011  . Mild mitral regurgitation   . Perianal fistula   . S/P CABG x 4    1988  . S/P drug eluting coronary stent placement    POST CABG--  STENTING 2000  AND RESTENTING 2006 FOR REINSTENT STENOSIS  . Stroke [facility])     Past Surgical History:  Procedure Laterality Date  . CATARACT EXTRACTION W/ INTRAOCULAR LENS  IMPLANT, BILATERAL    . CORONARY ANGIOPLASTY WITH STENT PLACEMENT  2000   PCI AND STENTING CIRCUMFLEX  . CORONARY ANGIOPLASTY WITH STENT PLACEMENT  04-30-2004  DR Daneen Schick   RE-INSTENT STENOSIS/  DRUG-ELUTING STENT OF PROXIMAL AND MID CIRCUMFLEX/ PCI MID CIRCUMFLEX THROUGH STENT STRUT/  WIDELY PATENT SAPHENOUS VEIN GRAFT AND  LIMA TO LAD GRAFT/ TOTAL OCCLUSION RIGHT CORONARY BEYOND THE POSTERIOR DESCENDING ARTERY BRANCH WITH 50% OSTIAL NARROWING/ TOTAL OCCLUSION OF LAD AT THE OSTIUM OF THE LEFT  MAIN  . CORONARY ARTERY BYPASS GRAFT  1988   X5  . EVALUATION UNDER ANESTHESIA WITH ANAL FISTULECTOMY N/A 07/22/2012   Procedure: EXAM UNDER ANESTHESIA WITH ANAL fistulotomy;  Surgeon: Leighton Ruff, MD;  Location: [place];  Service: General;  Laterality: N/A;  . SHOULDER ARTHROSCOPY WITH OPEN ROTATOR CUFF REPAIR Right 07-10-1999  . TONSILLECTOMY AND ADENOIDECTOMY  1944  . TRANSTHORACIC ECHOCARDIOGRAM  03-01-2011  DR Daneen Schick   NORMAL LVF AND LV SIZE/ MILD LEFT ATRIAL ENLARGEMENT/  GRADE II DIASTOLIC DYSFUNCTION WITH ELEVATED LEFT ATRIAL PRESSURE/ MILD TO MODERATE MR/ MILD TR  . TRANSURETHRAL RESECTION OF PROSTATE  04/09/2011   Procedure: TRANSURETHRAL RESECTION OF THE PROSTATE WITH GYRUS INSTRUMENTS;  Surgeon: Malka So, MD;  Location: [facility];  Service: Urology;  Laterality: N/A;     Home Medications:  Prior to Admission medications   Medication Sig Start Date Malani Lees Date Taking? Authorizing Provider  atenolol (TENORMIN) 25 MG tablet Take 25 mg by mouth daily after breakfast.   Yes [provider]  atorvastatin (LIPITOR) 20 MG tablet Take 1 tablet (20 mg total) by mouth at bedtime. 02/24/18  Yes Tonia Ghent, MD  clopidogrel (PLAVIX) 75 MG tablet Take 75 mg by mouth daily. 08/25/13  Yes [provider]  cyanocobalamin (,VITAMIN B-12,) 1000 MCG/ML injection INJECT 1ML IM MONTHLY 02/24/18  Yes Tonia Ghent, MD  ketoconazole (NIZORAL) 2 % cream Apply 1 application topically 2 (two) times daily. 04/05/16  Yes Venia Carbon, MD  nitroGLYCERIN (NITROSTAT) 0.4 MG SL tablet Place 1 tablet (0.4 mg total) under the tongue every 5 (five) minutes as needed for chest pain. 05/04/17  Yes Tonia Ghent, MD  pantoprazole (PROTONIX) 40 MG tablet Take 1 tablet (40 mg total) by mouth daily. 02/24/18  Yes Tonia Ghent, MD  polyethylene glycol [facility] Floria Raveling) packet Take 17 g by mouth daily.   Yes [provider]  docusate sodium (COLACE) 100 MG capsule Take 200 mg by mouth 2 (two) times daily.     [provider]  NEEDLE, DISP, 25 G 25G X 1" MISC by Does not apply route. Use as directed    [provider]  Syringe/Needle, Disp, (SYRINGE 3CC/25GX1") 25G X 1" 3 ML MISC by Does not apply route. Use as iinstructed    [provider]    Inpatient Medications: Scheduled Meds: . [START ON 04/01/2018] aspirin  81 mg Oral Pre-Cath  . aspirin EC  81 mg Oral Daily  . atenolol  25 mg Oral QPC breakfast  . atorvastatin  20 mg Oral  QHS  . betamethasone acetate-betamethasone sodium phosphate  12 mg Intramuscular Once  . betamethasone acetate-betamethasone sodium phosphate  3 mg Intramuscular Once  . clopidogrel  75 mg Oral Daily  . enoxaparin (LOVENOX) injection  1 mg/kg Subcutaneous Q12H  . nitroGLYCERIN  0.5 inch Topical Q6H  . pantoprazole  40 mg Oral Daily  . polyethylene glycol  17 g Oral Daily  . sodium chloride flush  3 mL Intravenous Q12H   Continuous Infusions: . sodium chloride 50 mL/hr at 03/31/18 0604  . sodium chloride    . sodium chloride     PRN Meds: sodium chloride, acetaminophen, nitroGLYCERIN, ondansetron (ZOFRAN) IV, sodium chloride flush  Allergies:    Allergies  Allergen Reactions  . Iodine Rash    Rash when applied to skin  . Niacin Itching  . Niacin And Related Hives and Other (See Comments)    FLUSHING  .  Adhesive [Tape] Rash    Social History:   Social History   Socioeconomic History  . Marital status: Widowed    Spouse name: [name]  . Number of children: 3  . Years of education: B.D.  . Highest education level: Not on file  Occupational History  . Occupation: retired  Scientific laboratory technician  . Financial resource strain: Not on file  . Food insecurity:    Worry: Not on file    Inability: Not on file  . Transportation needs:    Medical: Not on file    Non-medical: Not on file  Tobacco Use  . Smoking status: Never Smoker  . Smokeless tobacco: Never Used  Substance and Sexual Activity  . Alcohol use: No  . Drug use: No  . Sexual activity: Not Currently  Lifestyle  . Physical activity:    Days per week: Not on file    Minutes per session: Not on file  . Stress: Not on file  Relationships  . Social connections:    Talks on phone: Not on file    Gets together: Not on file    Attends religious service: Not on file    Active member of club or organization: Not on file    Attends meetings of clubs or organizations: Not on file    Relationship status: Not on file  .  Intimate partner violence:    Fear of current or ex partner: Not on file    Emotionally abused: Not on file    Physically abused: Not on file    Forced sexual activity: Not on file  Other Topics Concern  . Not on file  Social History Narrative   Lives alone, at [facility].  Has some help with housecleaning but o/w he is independent.     Was married to first wife 48 years, then to second wife 12 years.  Widowed twice.        Likes to travel.     Retired Theme park manager, [employer]   One son was killed in [place] in 1984   One son and one daughter (she is an Therapist, sports) still alive in 2019    Family History:    Family History  Problem Relation Age of Onset  . Breast cancer Mother   . Stroke Father   . Colon cancer Brother      ROS:  Please see the history of present illness. All other ROS reviewed and negative.     Physical Exam/Data:   Vitals:   03/31/18 0705 03/31/18 0830 03/31/18 0900 03/31/18 0940  BP: (!) 159/103 (!) 152/82 (!) 170/96 132/77  Pulse: 77 69 71 65  Resp: 17 19 11  (!) 22  Temp:      TempSrc:      SpO2: 97% 94% 94% 94%  Weight:      Height:       No intake or output data in the 24 hours ending 03/31/18 1009 Last 3 Weights 03/31/2018 02/24/2018 02/19/2018  Weight (lbs) 162 lb 183 lb 8 oz 178 lb 8 oz  Weight (kg) 73.483 kg 83.235 kg 80.967 kg     Body mass index is 27.81 kg/m.  General:  Well nourished, well developed, in no acute distress.  He is accompanied by his son and daughter. HEENT: normal Lymph: no adenopathy Neck: no JVD Endocrine:  No thryomegaly Vascular: No carotid bruits; 2+ radial and pedal pulses bilaterally. Cardiac: Distant heart sounds.  Regular rate and rhythm without murmurs. Lungs:  clear  to auscultation bilaterally, no wheezing, rhonchi or rales  Abd: soft, nontender, no hepatomegaly  Ext: 1+ ankle edema bilaterally. Musculoskeletal:  No deformities, BUE and BLE strength normal and equal Skin: warm and dry  Neuro:  CNs 2-12  intact, no focal abnormalities noted Psych:  Normal affect   EKG:  The EKG was personally reviewed and demonstrates: Normal sinus rhythm with PACs and nonspecific ST segment changes.  Relevant CV Studies: TTE (12/21/2012): Normal LV size.  Mild LVH.  LVEF 55 to 60% with normal wall motion.  Grade 1 diastolic dysfunction.  Mild left atrial enlargement.  Normal RV size and function.  Laboratory Data:  Chemistry Recent Labs  Lab 03/31/18 0223 03/31/18 0552  NA 135 135  K 4.1 4.2  CL 102 103  CO2 29 29  GLUCOSE 110* 110*  BUN 11 10  CREATININE 1.07 1.01  CALCIUM 8.5* 8.4*  GFRNONAA >60 >60  GFRAA >60 >60  ANIONGAP 4* 3*    No results for input(s): PROT, ALBUMIN, AST, ALT, ALKPHOS, BILITOT in the last 168 hours. Hematology Recent Labs  Lab 03/31/18 0223 03/31/18 0552  WBC 6.7 7.1  RBC 4.70 4.51  HGB 14.2 13.8  HCT 43.2 41.9  MCV 91.9 92.9  MCH 30.2 30.6  MCHC 32.9 32.9  RDW 12.6 12.5  PLT 223 210   Cardiac Enzymes Recent Labs  Lab 03/31/18 0223 03/31/18 0552  TROPONINI 0.28* 0.30*   No results for input(s): TROPIPOC in the last 168 hours.  BNPNo results for input(s): BNP, PROBNP in the last 168 hours.  DDimer No results for input(s): DDIMER in the last 168 hours.  Radiology/Studies:  Dg Chest 2 View  Result Date: 03/31/2018 CLINICAL DATA:  Central chest pain EXAM: CHEST - 2 VIEW COMPARISON:  09/21/2013 FINDINGS: The patient is status post CABG. Heart size is top normal in appearance. Mild aortic atherosclerosis with uncoiling. A double density over the cardiac silhouette may represent a hiatal hernia. The lungs are mildly hyperinflated without acute pulmonary consolidation or CHF. No effusion or pneumothorax. Degenerative change is noted the dorsal spine. IMPRESSION: No active cardiopulmonary disease. Electronically Signed   By: Ashley Royalty M.D.   On: 03/31/2018 03:57    Assessment and Plan:   NSTEMI Symptoms and mild troponin elevation are concerning for  NSTEMI.  Given continued chest pain, albeit mild, I have recommended early invasive strategy with cardiac catheterization today.  I have discussed temporary suspension of the DNR order, which Jason Velazquez is in agreement with.  I have reviewed the risks, indications, and alternatives to cardiac catheterization, possible angioplasty, and stenting with the patient. Risks include but are not limited to bleeding, infection, vascular injury, stroke, myocardial infection, arrhythmia, kidney injury, radiation-related injury in the case of prolonged fluoroscopy use, emergency cardiac surgery, and death. The patient understands the risks of serious complication is 1-2 in 5784 with diagnostic cardiac cath and 1-2% or less with angioplasty/stenting.  Patient received therapeutic dose enoxaparin this morning by EDP.  Will defer adding heparin at this time.  If he has worsening chest pain, nitroglycerin infusion will need to be considered.  We will plan to continue aspirin and clopidogrel as well as statin therapy for secondary prevention.  Echocardiogram is pending.  Hypertension Blood pressure elevated.  Consider adding nitroglycerin infusion, particularly if chest pain does not improve or worsens.  Hyperlipidemia Patient on atorvastatin 20 mg daily at home.  LDL today not at goal (82).  Will increase atorvastatin to 40 mg daily.  For questions or updates, please contact [place] Please consult [email or web address] for contact info under [facility] Cardiology.  Signed, Nelva Bush, MD  03/31/2018 10:09 AM

## 2018-03-31 NOTE — ED Notes (Signed)
Patient currently resting in bed. Patient states he is going to try and catch a nap.Lights turned down for patient to relax. Vitals WDL. Will continue to monitor.

## 2018-03-31 NOTE — Progress Notes (Signed)
ANTICOAGULATION CONSULT NOTE  Pharmacy Consult for heparin Indication: chest pain/ACS  Patient Measurements: Height: 5\' 4"  (162.6 cm) Weight: 160 lb (72.6 kg) IBW/kg (Calculated) : 59.2 Heparin Dosing Weight: 72.6 kg  Vital Signs: Temp: 97.7 F (36.5 C) (02/18 1334) Temp Source: Oral (02/18 1334) BP: 161/91 (02/18 1334) Pulse Rate: 65 (02/18 1334)  Labs: Recent Labs    03/31/18 0223 03/31/18 0552 03/31/18 1153  HGB 14.2 13.8  --   HCT 43.2 41.9  --   PLT 223 210  --   APTT  --   --  33  LABPROT  --  13.1  --   INR  --  1.00  --   CREATININE 1.07 1.01  --   TROPONINI 0.28* 0.30* 1.04*    Estimated Creatinine Clearance: 47.1 mL/min (by C-G formula based on SCr of 1.01 mg/dL).   Medical History: Past Medical History:  Diagnosis Date  . Allergic rhinitis   . Benign prostatic hypertrophy   . Coronary artery disease CARDIOLOGIST -- DR Daneen Schick  . Duodenal diverticulum 2003  . GERD (gastroesophageal reflux disease)   . H/O hiatal hernia 2003  . Heart murmur   . Hepatic lesion 2004  . History of melanoma excision    FACE  . Hypercholesteremia   . Hypertension   . Left carotid stenosis    > 50%  PER DUPLEX  03-28-2011  . Mild mitral regurgitation   . Perianal fistula   . S/P CABG x 4    1988  . S/P drug eluting coronary stent placement    POST CABG--  STENTING 2000  AND RESTENTING 2006 FOR REINSTENT STENOSIS  . Stroke [facility])     Medications:  Facility-Administered Medications Prior to Admission  Medication Dose Route Frequency Provider Last Rate Last Dose  . betamethasone acetate-betamethasone sodium phosphate (CELESTONE) injection 12 mg  12 mg Intramuscular Once Daylene Katayama M, DPM      . betamethasone acetate-betamethasone sodium phosphate (CELESTONE) injection 3 mg  3 mg Intramuscular Once Edrick Kins, DPM       Medications Prior to Admission  Medication Sig Dispense Refill Last Dose  . atenolol (TENORMIN) 25 MG tablet Take 25 mg by mouth daily  after breakfast.   03/30/2018 at Unknown time  . atorvastatin (LIPITOR) 20 MG tablet Take 1 tablet (20 mg total) by mouth at bedtime. 90 tablet 3 03/30/2018 at Unknown time  . clopidogrel (PLAVIX) 75 MG tablet Take 75 mg by mouth daily.   03/30/2018 at Unknown time  . cyanocobalamin (,VITAMIN B-12,) 1000 MCG/ML injection INJECT 1ML IM MONTHLY 12 mL 1 Past Month at Unknown time  . ketoconazole (NIZORAL) 2 % cream Apply 1 application topically 2 (two) times daily. 60 g 1 03/30/2018 at Unknown time  . nitroGLYCERIN (NITROSTAT) 0.4 MG SL tablet Place 1 tablet (0.4 mg total) under the tongue every 5 (five) minutes as needed for chest pain. 25 tablet 5 03/31/2018 at prn  . pantoprazole (PROTONIX) 40 MG tablet Take 1 tablet (40 mg total) by mouth daily. 90 tablet 3 03/30/2018 at Unknown time  . polyethylene glycol (MIRALAX / GLYCOLAX) packet Take 17 g by mouth daily.   03/30/2018 at Unknown time  . docusate sodium (COLACE) 100 MG capsule Take 200 mg by mouth 2 (two) times daily.    Not Taking at Unknown time  . NEEDLE, DISP, 25 G 25G X 1" MISC by Does not apply route. Use as directed   Not Taking at Unknown time  .  Syringe/Needle, Disp, (SYRINGE 3CC/25GX1") 25G X 1" 3 ML MISC by Does not apply route. Use as iinstructed   Not Taking at Unknown time   Scheduled:  . [MAR Hold] aspirin EC  81 mg Oral Daily  . [MAR Hold] atorvastatin  40 mg Oral QHS  . [MAR Hold] clopidogrel  75 mg Oral Daily  . [MAR Hold] nitroGLYCERIN  0.5 inch Topical Q6H  . [MAR Hold] pantoprazole  40 mg Oral Daily  . [MAR Hold] polyethylene glycol  17 g Oral Daily  . sodium chloride flush  3 mL Intravenous Q12H   Infusions:  . sodium chloride 50 mL/hr at 03/31/18 0604  . sodium chloride    . sodium chloride     PRN: sodium chloride, [MAR Hold] acetaminophen, [MAR Hold] nitroGLYCERIN, [MAR Hold] ondansetron (ZOFRAN) IV, sodium chloride flush  Assessment: Pharmacy was consulted for heparin dosing for ACS. Trop elevated. No DOAC PTA  noted. Patient received therapeutic dose of enoxaparin this morning therefore heparin was deferred until now. Cardiac catheterization this morning shows triple-vessel disease: cardiology is recommending heparin with no loading dose.  Goal of Therapy:  Heparin level 0.3-0.7 units/ml Monitor platelets by anticoagulation protocol: Yes   Plan:  Start heparin infusion at 850 units/hr at 2200 with no loading dose per Dr End Check anti-Xa level 8 hours after infusion begins and daily while on heparin Continue to monitor H&H and platelets  Dallie Piles, PharmD 03/31/2018,3:33 PM

## 2018-03-31 NOTE — ED Provider Notes (Signed)
[facility] Emergency Department Provider Note ____________________________________________   First MD Initiated Contact with Patient 03/31/18 0221     (approximate)  I have reviewed the triage vital signs and the nursing notes.   HISTORY  Chief Complaint Chest Pain    HPI Jason Velazquez is a 85 y.o. male with PMH as noted below including CAD with CABG in 1988, and stenting in 2000 and 2006 who presents with chest pain, acute onset around 1:30 AM, awakening him from sleep, and described as sharp.  It was in the upper part of the central chest just below his neck.  It lasted for approximately 5 to 10 minutes and then resolved.  He reports some associated nausea but denies shortness of breath, lightheadedness, cough, or weakness.  He is currently asymptomatic.  He denies eating anything unusual for dinner last night.  Past Medical History:  Diagnosis Date  . Allergic rhinitis   . Benign prostatic hypertrophy   . Coronary artery disease CARDIOLOGIST -- DR Daneen Schick  . Duodenal diverticulum 2003  . GERD (gastroesophageal reflux disease)   . H/O hiatal hernia 2003  . Heart murmur   . Hepatic lesion 2004  . History of melanoma excision    FACE  . Hypercholesteremia   . Hypertension   . Left carotid stenosis    > 50%  PER DUPLEX  03-28-2011  . Mild mitral regurgitation   . Perianal fistula   . S/P CABG x 4    1988  . S/P drug eluting coronary stent placement    POST CABG--  STENTING 2000  AND RESTENTING 2006 FOR REINSTENT STENOSIS  . Stroke [facility])     Patient Active Problem List   Diagnosis Date Noted  . Health care maintenance 02/25/2018  . GERD (gastroesophageal reflux disease) 02/25/2018  . Vision changes 02/25/2018  . Occipital pain 10/22/2017  . B12 deficiency 08/31/2017  . Memory loss 08/26/2017  . Dermatitis 04/05/2016  . Advance care planning 03/14/2016  . Allergic rhinitis 11/02/2015  . Acute ischemic stroke ([place]) 05/26/2013  . HTN  (hypertension) 05/26/2013  . HLD (hyperlipidemia) 05/26/2013  . CAD (coronary artery disease) 05/26/2013  . BPH (benign prostatic hypertrophy) with urinary obstruction 04/09/2011    Past Surgical History:  Procedure Laterality Date  . CATARACT EXTRACTION W/ INTRAOCULAR LENS  IMPLANT, BILATERAL    . CORONARY ANGIOPLASTY WITH STENT PLACEMENT  2000   PCI AND STENTING CIRCUMFLEX  . CORONARY ANGIOPLASTY WITH STENT PLACEMENT  04-30-2004  DR Daneen Schick   RE-INSTENT STENOSIS/  DRUG-ELUTING STENT OF PROXIMAL AND MID CIRCUMFLEX/ PCI MID CIRCUMFLEX THROUGH STENT STRUT/  WIDELY PATENT SAPHENOUS VEIN GRAFT AND  LIMA TO LAD GRAFT/ TOTAL OCCLUSION RIGHT CORONARY BEYOND THE POSTERIOR DESCENDING ARTERY BRANCH WITH 50% OSTIAL NARROWING/ TOTAL OCCLUSION OF LAD AT THE OSTIUM OF THE LEFT MAIN  . CORONARY ARTERY BYPASS GRAFT  1988   X5  . EVALUATION UNDER ANESTHESIA WITH ANAL FISTULECTOMY N/A 07/22/2012   Procedure: EXAM UNDER ANESTHESIA WITH ANAL fistulotomy;  Surgeon: Leighton Ruff, MD;  Location: [place];  Service: General;  Laterality: N/A;  . SHOULDER ARTHROSCOPY WITH OPEN ROTATOR CUFF REPAIR Right 07-10-1999  . TONSILLECTOMY AND ADENOIDECTOMY  1944  . TRANSTHORACIC ECHOCARDIOGRAM  03-01-2011  DR Daneen Schick   NORMAL LVF AND LV SIZE/ MILD LEFT ATRIAL ENLARGEMENT/ GRADE II DIASTOLIC DYSFUNCTION WITH ELEVATED LEFT ATRIAL PRESSURE/ MILD TO MODERATE MR/ MILD TR  . TRANSURETHRAL RESECTION OF PROSTATE  04/09/2011   Procedure: TRANSURETHRAL RESECTION  OF THE PROSTATE WITH GYRUS INSTRUMENTS;  Surgeon: Malka So, MD;  Location: [facility];  Service: Urology;  Laterality: N/A;    Prior to Admission medications   Medication Sig Start Date End Date Taking? Authorizing Provider  atenolol (TENORMIN) 25 MG tablet Take 25 mg by mouth daily after breakfast.    [provider]  atorvastatin (LIPITOR) 20 MG tablet Take 1 tablet (20 mg total) by mouth at bedtime. 02/24/18   Tonia Ghent, MD    clopidogrel (PLAVIX) 75 MG tablet Take 75 mg by mouth daily. 08/25/13   [provider]  cyanocobalamin (,VITAMIN B-12,) 1000 MCG/ML injection INJECT 1ML IM MONTHLY 02/24/18   Tonia Ghent, MD  docusate sodium (COLACE) 100 MG capsule Take 200 mg by mouth 2 (two) times daily.     [provider]  ketoconazole (NIZORAL) 2 % cream Apply 1 application topically 2 (two) times daily. 04/05/16   Venia Carbon, MD  NEEDLE, DISP, 25 G 25G X 1" MISC by Does not apply route. Use as directed    [provider]  nitroGLYCERIN (NITROSTAT) 0.4 MG SL tablet Place 1 tablet (0.4 mg total) under the tongue every 5 (five) minutes as needed for chest pain. 05/04/17   Tonia Ghent, MD  pantoprazole (PROTONIX) 40 MG tablet Take 1 tablet (40 mg total) by mouth daily. 02/24/18   Tonia Ghent, MD  polyethylene glycol [facility] Floria Raveling) packet Take 17 g by mouth daily.    [provider]  Syringe/Needle, Disp, (SYRINGE 3CC/25GX1") 25G X 1" 3 ML MISC by Does not apply route. Use as iinstructed    [provider]    Allergies Iodine; Niacin; Niacin and related; and Adhesive [tape]  Family History  Problem Relation Age of Onset  . Breast cancer Mother   . Stroke Father   . Colon cancer Brother     Social History Social History   Tobacco Use  . Smoking status: Never Smoker  . Smokeless tobacco: Never Used  Substance Use Topics  . Alcohol use: No  . Drug use: No    Review of Systems  Constitutional: No fever. Eyes: No redness. ENT: No neck pain. Cardiovascular: Positive for resolved chest pain. Respiratory: Denies shortness of breath. Gastrointestinal: Positive for resolved nausea.  Genitourinary: Negative for flank pain.  Musculoskeletal: Negative for back pain. Skin: Negative for rash. Neurological: Negative for headache.   ____________________________________________   PHYSICAL EXAM:  VITAL SIGNS: ED Triage Vitals  Enc Vitals Group      BP 03/31/18 0231 107/74     Pulse Rate 03/31/18 0231 79     Resp 03/31/18 0231 18     Temp 03/31/18 0231 97.9 F (36.6 C)     Temp Source 03/31/18 0231 Oral     SpO2 03/31/18 0224 97 %     Weight --      Height --      Head Circumference --      Peak Flow --      Pain Score 03/31/18 0232 0     Pain Loc --      Pain Edu? --      Excl. in [place]? --     Constitutional: Alert and oriented. Well appearing for age and in no acute distress. Eyes: Conjunctivae are normal.  Head: Atraumatic. Nose: No congestion/rhinnorhea. Mouth/Throat: Mucous membranes are moist.   Neck: Normal range of motion.  Cardiovascular: Normal rate, regular rhythm. Grossly normal heart sounds.  Good  peripheral circulation. Respiratory: Normal respiratory effort.  No retractions. Lungs CTAB. Gastrointestinal: No distention.  Musculoskeletal: Trace bilateral lower extremity edema.  Extremities warm and well perfused.  Neurologic:  Normal speech and language. No gross focal neurologic deficits are appreciated.  Skin:  Skin is warm and dry. No rash noted. Psychiatric: Mood and affect are normal. Speech and behavior are normal.  ____________________________________________   LABS (all labs ordered are listed, but only abnormal results are displayed)  Labs Reviewed  BASIC METABOLIC PANEL - Abnormal; Notable for the following components:      Result Value   Glucose, Bld 110 (*)    Calcium 8.5 (*)    Anion gap 4 (*)    All other components within normal limits  TROPONIN I - Abnormal; Notable for the following components:   Troponin I 0.28 (*)    All other components within normal limits  CBC WITH DIFFERENTIAL/PLATELET   ____________________________________________  EKG  ED ECG REPORT I, Arta Silence, the attending physician, personally viewed and interpreted this ECG.  Date: 03/31/2018 EKG Time: 0218 Rate: 73 Rhythm: normal sinus rhythm QRS Axis: normal Intervals: normal ST/T Wave  abnormalities: normal Narrative Interpretation: no evidence of acute ischemia  ____________________________________________  RADIOLOGY  CXR: No focal infiltrate or other acute abnormality  ____________________________________________   PROCEDURES  Procedure(s) performed: No  Procedures  Critical Care performed: No ____________________________________________   INITIAL IMPRESSION / ASSESSMENT AND PLAN / ED COURSE  Pertinent labs & imaging results that were available during my care of the patient were reviewed by me and considered in my medical decision making (see chart for details).  85 year old male with PMH as noted above including a history of CAD with remote history of CABG and stenting who presents with acute onset of atypical sharp chest pain at around 1:30 AM which lasted 5 to 10 minutes.  It was associated with nausea but no vomiting, but no other symptoms.  The patient is currently asymptomatic.  I reviewed the past medical records in Epic and confirmed the history above.  The patient has had no recent ED visits or admissions.  He states he currently does not have a cardiologist.  On exam, the patient is extremely well-appearing for his age.  His vital signs are normal.  The remainder of the exam is unremarkable.  EKG shows no significant acute abnormality.  Overall the presentation is consistent with noncardiac etiology of the chest pain, most likely acid reflux given the fact that it started during the night, was sharp, in the upper esophageal area, and subsequently resolved.  However given the patient's CAD history and therefore elevated risk of ACS we will need to rule him out with cardiac enzymes.  We will obtain basic labs, troponins x2, chest x-ray, and reassess.  Given the highly atypical nature of the symptoms if the patient remains asymptomatic and a 4-hour repeat troponin is negative, I anticipate discharge home.  ----------------------------------------- 3:45  AM on 03/31/2018 -----------------------------------------  Initial troponin is elevated.  The patient continues to be asymptomatic.  I gave aspirin which was not given by EMS.  I signed the patient out to the hospitalist Dr. Posey Pronto at approximately 3:45 AM.  Since the patient has no active symptoms and no EKG changes, I do not think that there is a strong indication for continuous heparin infusion.  Based on discussion with Dr. Posey Pronto, we will give the patient a dose of Lovenox at this time.  The patient agrees with the plan. ____________________________________________  FINAL CLINICAL IMPRESSION(S) / ED DIAGNOSES  Final diagnoses:  NSTEMI (non-ST elevated myocardial infarction) ([place])      NEW MEDICATIONS STARTED DURING THIS VISIT:  New Prescriptions   No medications on file     Note:  This document was prepared using Dragon voice recognition software and may include unintentional dictation errors.    Arta Silence, MD 03/31/18 [id number]

## 2018-04-01 ENCOUNTER — Encounter: Payer: Self-pay | Admitting: Internal Medicine

## 2018-04-01 LAB — HEPARIN LEVEL (UNFRACTIONATED)
Heparin Unfractionated: 0.53 IU/mL (ref 0.30–0.70)
Heparin Unfractionated: 0.54 IU/mL (ref 0.30–0.70)

## 2018-04-01 LAB — BASIC METABOLIC PANEL
Anion gap: 4 — ABNORMAL LOW (ref 5–15)
BUN: 11 mg/dL (ref 8–23)
CO2: 25 mmol/L (ref 22–32)
Calcium: 8.4 mg/dL — ABNORMAL LOW (ref 8.9–10.3)
Chloride: 106 mmol/L (ref 98–111)
Creatinine, Ser: 0.92 mg/dL (ref 0.61–1.24)
GFR calc Af Amer: >60 mL/min (ref >60)
GFR calc non Af Amer: >60 mL/min (ref >60)
Glucose, Bld: 104 mg/dL — ABNORMAL HIGH (ref 70–99)
Potassium: 4.3 mmol/L (ref 3.5–5.1)
Sodium: 135 mmol/L (ref 135–145)

## 2018-04-01 LAB — TROPONIN I
Troponin I: 3.31 ng/mL (ref <0.03)
Troponin I: 3.92 ng/mL (ref <0.03)

## 2018-04-01 LAB — CBC
HCT: 42.9 % (ref 39.0–52.0)
Hemoglobin: 13.9 g/dL (ref 13.0–17.0)
MCH: 30.3 pg (ref 26.0–34.0)
MCHC: 32.4 g/dL (ref 30.0–36.0)
MCV: 93.7 fL (ref 80.0–100.0)
Platelets: 214 10*3/uL (ref 150–400)
RBC: 4.58 MIL/uL (ref 4.22–5.81)
RDW: 12.8 % (ref 11.5–15.5)
WBC: 9.4 10*3/uL (ref 4.0–10.5)
nRBC: 0 % (ref 0.0–0.2)

## 2018-04-01 LAB — ECHOCARDIOGRAM COMPLETE
Height: 64 in
Weight: 2560 oz

## 2018-04-01 MED ORDER — ISOSORBIDE MONONITRATE ER 30 MG PO TB24
15.0000 mg | ORAL_TABLET | Freq: Every day | ORAL | Status: DC
  Administered 2018-04-02: 15 mg via ORAL
  Filled 2018-04-01: qty 1

## 2018-04-01 NOTE — Progress Notes (Addendum)
ANTICOAGULATION CONSULT NOTE  Pharmacy Consult for heparin Indication: chest pain/ACS  Patient Measurements: Height: 5\' 4"  (162.6 cm) Weight: 184 lb 14.4 oz (83.9 kg) IBW/kg (Calculated) : 59.2 Heparin Dosing Weight: 72.6 kg  Vital Signs: Temp: 97.8 F (36.6 C) (02/19 0733) Temp Source: Oral (02/19 0733) BP: 141/69 (02/19 0733) Pulse Rate: 67 (02/19 0733)  Labs: Recent Labs    03/31/18 0223 03/31/18 0552 03/31/18 1153 03/31/18 1739 04/01/18 0520 04/01/18 1247  HGB 14.2 13.8  --   --  13.9  --   HCT 43.2 41.9  --   --  42.9  --   PLT 223 210  --   --  214  --   APTT  --   --  33  --   --   --   LABPROT  --  13.1  --   --   --   --   INR  --  1.00  --   --   --   --   HEPARINUNFRC  --   --   --   --  0.53 0.54  CREATININE 1.07 1.01  --   --  0.92  --   TROPONINI 0.28* 0.30* 1.04* 2.21* 3.31*  --     Estimated Creatinine Clearance: 55.3 mL/min (by C-G formula based on SCr of 0.92 mg/dL).   Medical History: Past Medical History:  Diagnosis Date  . Allergic rhinitis   . Benign prostatic hypertrophy   . Coronary artery disease CARDIOLOGIST -- DR Daneen Schick  . Duodenal diverticulum 2003  . GERD (gastroesophageal reflux disease)   . H/O hiatal hernia 2003  . Heart murmur   . Hepatic lesion 2004  . History of melanoma excision    FACE  . Hypercholesteremia   . Hypertension   . Left carotid stenosis    > 50%  PER DUPLEX  03-28-2011  . Mild mitral regurgitation   . Perianal fistula   . S/P CABG x 4    1988  . S/P drug eluting coronary stent placement    POST CABG--  STENTING 2000  AND RESTENTING 2006 FOR REINSTENT STENOSIS  . Stroke [facility])     Medications:  Facility-Administered Medications Prior to Admission  Medication Dose Route Frequency Provider Last Rate Last Dose  . betamethasone acetate-betamethasone sodium phosphate (CELESTONE) injection 12 mg  12 mg Intramuscular Once Daylene Katayama M, DPM      . betamethasone acetate-betamethasone sodium phosphate  (CELESTONE) injection 3 mg  3 mg Intramuscular Once Edrick Kins, DPM       Medications Prior to Admission  Medication Sig Dispense Refill Last Dose  . atenolol (TENORMIN) 25 MG tablet Take 25 mg by mouth daily after breakfast.   03/30/2018 at Unknown time  . atorvastatin (LIPITOR) 20 MG tablet Take 1 tablet (20 mg total) by mouth at bedtime. 90 tablet 3 03/30/2018 at Unknown time  . clopidogrel (PLAVIX) 75 MG tablet Take 75 mg by mouth daily.   03/30/2018 at Unknown time  . cyanocobalamin (,VITAMIN B-12,) 1000 MCG/ML injection INJECT 1ML IM MONTHLY 12 mL 1 Past Month at Unknown time  . ketoconazole (NIZORAL) 2 % cream Apply 1 application topically 2 (two) times daily. 60 g 1 03/30/2018 at Unknown time  . nitroGLYCERIN (NITROSTAT) 0.4 MG SL tablet Place 1 tablet (0.4 mg total) under the tongue every 5 (five) minutes as needed for chest pain. 25 tablet 5 03/31/2018 at prn  . pantoprazole (PROTONIX) 40 MG tablet Take 1 tablet (  40 mg total) by mouth daily. 90 tablet 3 03/30/2018 at Unknown time  . polyethylene glycol (MIRALAX / GLYCOLAX) packet Take 17 g by mouth daily.   03/30/2018 at Unknown time  . docusate sodium (COLACE) 100 MG capsule Take 200 mg by mouth 2 (two) times daily.    Not Taking at Unknown time  . NEEDLE, DISP, 25 G 25G X 1" MISC by Does not apply route. Use as directed   Not Taking at Unknown time  . Syringe/Needle, Disp, (SYRINGE 3CC/25GX1") 25G X 1" 3 ML MISC by Does not apply route. Use as iinstructed   Not Taking at Unknown time   Scheduled:  . aspirin EC  81 mg Oral Daily  . atorvastatin  40 mg Oral QHS  . clopidogrel  75 mg Oral Daily  . isosorbide mononitrate  15 mg Oral Daily  . metoprolol succinate  25 mg Oral Daily  . pantoprazole  40 mg Oral Daily  . polyethylene glycol  17 g Oral Daily  . sodium chloride flush  3 mL Intravenous Q12H   Infusions:  . sodium chloride    . heparin 850 Units/hr (04/01/18 0300)   PRN: sodium chloride, acetaminophen, nitroGLYCERIN,  ondansetron (ZOFRAN) IV, sodium chloride flush  Assessment: Pharmacy was consulted for heparin dosing for ACS. Trop elevated. No DOAC PTA noted. Patient received therapeutic dose of enoxaparin this morning therefore heparin was deferred until now. Cardiac catheterization this morning shows triple-vessel disease: cardiology is recommending heparin with no loading dose.  2/18 @ 2200 heparin infusion stated @ 850 units/hr 2/19 @ 0520 HL:0.53- level; therapeutic x 1   Goal of Therapy:  Heparin level 0.3-0.7 units/ml Monitor platelets by anticoagulation protocol: Yes   Plan:  2/19 @ 1247 Heparin Level: 0.54. Level is therapeutic x 2. Will continue current infusion rate of 850 units/hr  Check anti-Xa level and CBC daily while on heparin Continue to monitor H&H and platelets  Pernell Dupre, PharmD, BCPS Clinical Pharmacist 04/01/2018 2:19 PM

## 2018-04-01 NOTE — Progress Notes (Signed)
MD notified about nitro paste. Pt is chest pain free and VSS. MD orders to discontinue to nitro paste and to add imdur 30mg  daily. I will continue to assess.

## 2018-04-01 NOTE — Progress Notes (Signed)
Progress Note  Patient Name: Jason Velazquez Date of Encounter: 04/01/2018  Primary Cardiologist: Nelva Bush, MD  Subjective   No chest pain or dyspnea overnight.  Mild groin tenderness and irritation where the area was shaved.  Inpatient Medications    Scheduled Meds: . aspirin EC  81 mg Oral Daily  . atorvastatin  40 mg Oral QHS  . clopidogrel  75 mg Oral Daily  . isosorbide mononitrate  15 mg Oral Daily  . metoprolol succinate  25 mg Oral Daily  . nitroGLYCERIN  0.5 inch Topical Q6H  . pantoprazole  40 mg Oral Daily  . polyethylene glycol  17 g Oral Daily  . sodium chloride flush  3 mL Intravenous Q12H   Continuous Infusions: . sodium chloride    . heparin 850 Units/hr (04/01/18 0300)   PRN Meds: sodium chloride, acetaminophen, nitroGLYCERIN, ondansetron (ZOFRAN) IV, sodium chloride flush   Vital Signs    Vitals:   03/31/18 1754 03/31/18 1935 04/01/18 0418 04/01/18 0733  BP: (!) 151/80 127/68 114/67 (!) 141/69  Pulse: 61 63 67 67  Resp:  19 18 15   Temp: 97.7 F (36.5 C) 97.7 F (36.5 C) 98.6 F (37 C) 97.8 F (36.6 C)  TempSrc: Oral Oral Oral Oral  SpO2: 98% 97% 94% 97%  Weight:   83.9 kg   Height:        Intake/Output Summary (Last 24 hours) at 04/01/2018 1159 Last data filed at 04/01/2018 0949 Gross per 24 hour  Intake 645.42 ml  Output 1150 ml  Net -504.58 ml   Filed Weights   03/31/18 0233 03/31/18 1009 04/01/18 0418  Weight: 73.5 kg 72.6 kg 83.9 kg    Physical Exam   GEN: Well nourished, well developed, in no acute distress.  HEENT: Grossly normal.  Neck: Supple, no JVD, carotid bruits, or masses. Cardiac: RRR, no murmurs, rubs, or gallops. No clubbing, cyanosis, edema.  Radials/DP/PT 2+ and equal bilaterally.  Right radial cath site without bleeding, bruit, or hematoma.  Right groin mildly erythematous/irritated. Respiratory:  Respirations regular and unlabored, clear to auscultation bilaterally. GI: Soft, nontender, nondistended, BS +  x 4. MS: no deformity or atrophy. Skin: warm and dry, no rash. Neuro:  Strength and sensation are intact. Psych: AAOx3.  Normal affect.  Labs    Chemistry Recent Labs  Lab 03/31/18 0223 03/31/18 0552 04/01/18 0520  NA 135 135 135  K 4.1 4.2 4.3  CL 102 103 106  CO2 29 29 25   GLUCOSE 110* 110* 104*  BUN 11 10 11   CREATININE 1.07 1.01 0.92  CALCIUM 8.5* 8.4* 8.4*  GFRNONAA >60 >60 >60  GFRAA >60 >60 >60  ANIONGAP 4* 3* 4*     Hematology Recent Labs  Lab 03/31/18 0223 03/31/18 0552 04/01/18 0520  WBC 6.7 7.1 9.4  RBC 4.70 4.51 4.58  HGB 14.2 13.8 13.9  HCT 43.2 41.9 42.9  MCV 91.9 92.9 93.7  MCH 30.2 30.6 30.3  MCHC 32.9 32.9 32.4  RDW 12.6 12.5 12.8  PLT 223 210 214    Cardiac Enzymes Recent Labs  Lab 03/31/18 0552 03/31/18 1153 03/31/18 1739 04/01/18 0520  TROPONINI 0.30* 1.04* 2.21* 3.31*      Radiology    Dg Chest 2 View  Result Date: 03/31/2018 CLINICAL DATA:  Central chest pain EXAM: CHEST - 2 VIEW COMPARISON:  09/21/2013 FINDINGS: The patient is status post CABG. Heart size is top normal in appearance. Mild aortic atherosclerosis with uncoiling. A double density over the  cardiac silhouette may represent a hiatal hernia. The lungs are mildly hyperinflated without acute pulmonary consolidation or CHF. No effusion or pneumothorax. Degenerative change is noted the dorsal spine. IMPRESSION: No active cardiopulmonary disease. Electronically Signed   By: Ashley Royalty M.D.   On: 03/31/2018 03:57    Telemetry    Regular sinus rhythm- Personally Reviewed  Cardiac Studies   2D Echocardiogram 2.19.2020  1. The left ventricle has normal systolic function, with an ejection fraction of 55-60%. The cavity size was normal. There is moderately increased left ventricular wall thickness. Left ventricular diastolic Doppler parameters are consistent with  impaired relaxation Indeterminent filling pressures.  2. The right ventricle has normal systolic function. The  cavity was normal. There is no increase in right ventricular wall thickness.  3. The mitral valve is degenerative. Moderate thickening of the mitral valve leaflet. Mild calcification of the mitral valve leaflet.  4. The tricuspid valve is normal in structure. Tricuspid valve regurgitation is mild-moderate.  5. The aortic valve is tricuspid Mild thickening of the aortic valve Mild calcification of the aortic valve.  6. Mild hypokinesis of the left ventricular anterolateral wall.  7. The interatrial septum was not well visualized. _____________   Cardiac Catheterization 2.19.2020  Left Main  Dist LM lesion 25% stenosed  Dist LM lesion is 25% stenosed.  Left Anterior Descending  Ost LAD lesion 100% stenosed  Ost LAD lesion is 100% stenosed. The lesion is chronically occluded.  Mid LAD lesion 100% stenosed  Mid LAD lesion is 100% stenosed. The lesion is chronically occluded.  Mid LAD to Dist LAD lesion 70% stenosed  Mid LAD to Dist LAD lesion is 70% stenosed.  First Septal Branch  Collaterals  1st Sept filled by collaterals from [place].    Second Diagonal Branch  Collaterals  2nd Diag filled by collaterals from 3rd Diag.    Left Circumflex  Ost Cx to Mid Cx lesion 90% stenosed  Ost Cx to Mid Cx lesion is 90% stenosed. The lesion was previously treated. There appears to be discontinuity of one of the overlapping stents, consistent with fracture.  Mid Cx lesion 80% stenosed  Mid Cx lesion is 80% stenosed.  Second Obtuse Marginal Branch  Ost 2nd Mrg to 2nd Mrg lesion 30% stenosed  Ost 2nd Mrg to 2nd Mrg lesion is 30% stenosed. The lesion was previously treated.  2nd Mrg-1 lesion 80% stenosed  2nd Mrg-1 lesion is 80% stenosed.  2nd Mrg-2 lesion 80% stenosed  2nd Mrg-2 lesion is 80% stenosed.  Right Coronary Artery  There is mild diffuse disease throughout the vessel.  Prox RCA lesion 70% stenosed  Prox RCA lesion is 70% stenosed. The lesion is eccentric.  Right Posterior Descending  Artery  Ost RPDA to RPDA lesion 60% stenosed  Ost RPDA to RPDA lesion is 60% stenosed.  RPDA lesion 70% stenosed  RPDA lesion is 70% stenosed.  Right Posterior Atrioventricular Branch  Post Atrio lesion 100% stenosed  Post Atrio lesion is 100% stenosed.  Sequential saphenous Graft to 1st Diag, 2nd Diag  SVG.  Prox Graft lesion before 1st Diag 100% stenosed  Prox Graft lesion before 1st Diag is 100% stenosed. Vessel is the culprit lesion.  LIMA LIMA Graft to Mid LAD  LIMA and is normal in caliber.  saphenous Graft to 2nd RPLB  SVG and is normal in caliber.   The left ventricular size is normal. There is mild to moderate left ventricular systolic dysfunction. LVEF ~45%. LV end diastolic pressure is mildly  elevated. LVEDP ~20 mmHg. There are LV function abnormalities. There is mild to moderate mitral regurgitation.  _____________    Patient Profile     85 y.o. male with a history of CAD status post CABG (LIMA to LAD, sequential SVG to D1 and D2, and SVG to RPL) and subsequent PCI to proximal LCx/OM, hypertension, hyperlipidemia, carotid artery stenosis with prior stroke and some residual memory deficit, and BPH, who presented on February 18 with non-STEMI and subsequently underwent catheterization revealing an occluded sequential graft to the diagonal branches, which will be medically managed.    Assessment & Plan    1.  Non-STEMI/coronary artery disease: Patient presented February 18 after awakening with acute onset of chest pain and found to have troponin elevation to 0.30.  Diagnostic catheterization was performed and showed severe, native, multivessel coronary artery disease with a new occlusion of a sequential graft to the first and second diagonal branches. LIMA to the LAD and vein graft to the RPL were patent.  The diagonal branches were noted to have left to left and right to left collateralization and thus medical therapy was recommended.  Patient has had no recurrent chest pain or  dyspnea.  Troponin has trended up to 3.31 this morning.  Continue aspirin, statin, Plavix, and beta-blocker therapy. F/u troponin this afternoon and plan to continue heparin until the AM.  I will add low dose imdur given severity of dzs and elevated bp's.  Cardiac rehab to see.  Hopefully DC in the a.m.  2.  Essential hypertension: Blood pressure has been fairly variable, trending in the 1 teens to 150s.  Adding isosorbide mononitrate as above.  Continue beta-blocker.  3.  Hyperlipidemia: LDL was 82 on admission.  Home dose of atorvastatin increased to 40 mg this admission.  Signed, Murray Hodgkins, NP  04/01/2018, 11:59 AM    For questions or updates, please contact   Please consult [email or web address] for contact info under Cardiology/STEMI.

## 2018-04-01 NOTE — Progress Notes (Signed)
ANTICOAGULATION CONSULT NOTE  Pharmacy Consult for heparin Indication: chest pain/ACS  Patient Measurements: Height: 5\' 4"  (162.6 cm) Weight: 184 lb 14.4 oz (83.9 kg) IBW/kg (Calculated) : 59.2 Heparin Dosing Weight: 72.6 kg  Vital Signs: Temp: 98.6 F (37 C) (02/19 0418) Temp Source: Oral (02/19 0418) BP: 114/67 (02/19 0418) Pulse Rate: 67 (02/19 0418)  Labs: Recent Labs    03/31/18 0223 03/31/18 0552 03/31/18 1153 03/31/18 1739 04/01/18 0520  HGB 14.2 13.8  --   --  13.9  HCT 43.2 41.9  --   --  42.9  PLT 223 210  --   --  214  APTT  --   --  33  --   --   LABPROT  --  13.1  --   --   --   INR  --  1.00  --   --   --   HEPARINUNFRC  --   --   --   --  0.53  CREATININE 1.07 1.01  --   --   --   TROPONINI 0.28* 0.30* 1.04* 2.21*  --     Estimated Creatinine Clearance: 50.4 mL/min (by C-G formula based on SCr of 1.01 mg/dL).   Medical History: Past Medical History:  Diagnosis Date  . Allergic rhinitis   . Benign prostatic hypertrophy   . Coronary artery disease CARDIOLOGIST -- DR Daneen Schick  . Duodenal diverticulum 2003  . GERD (gastroesophageal reflux disease)   . H/O hiatal hernia 2003  . Heart murmur   . Hepatic lesion 2004  . History of melanoma excision    FACE  . Hypercholesteremia   . Hypertension   . Left carotid stenosis    > 50%  PER DUPLEX  03-28-2011  . Mild mitral regurgitation   . Perianal fistula   . S/P CABG x 4    1988  . S/P drug eluting coronary stent placement    POST CABG--  STENTING 2000  AND RESTENTING 2006 FOR REINSTENT STENOSIS  . Stroke [facility])     Medications:  Facility-Administered Medications Prior to Admission  Medication Dose Route Frequency Provider Last Rate Last Dose  . betamethasone acetate-betamethasone sodium phosphate (CELESTONE) injection 12 mg  12 mg Intramuscular Once Daylene Katayama M, DPM      . betamethasone acetate-betamethasone sodium phosphate (CELESTONE) injection 3 mg  3 mg Intramuscular Once Edrick Kins, DPM       Medications Prior to Admission  Medication Sig Dispense Refill Last Dose  . atenolol (TENORMIN) 25 MG tablet Take 25 mg by mouth daily after breakfast.   03/30/2018 at Unknown time  . atorvastatin (LIPITOR) 20 MG tablet Take 1 tablet (20 mg total) by mouth at bedtime. 90 tablet 3 03/30/2018 at Unknown time  . clopidogrel (PLAVIX) 75 MG tablet Take 75 mg by mouth daily.   03/30/2018 at Unknown time  . cyanocobalamin (,VITAMIN B-12,) 1000 MCG/ML injection INJECT 1ML IM MONTHLY 12 mL 1 Past Month at Unknown time  . ketoconazole (NIZORAL) 2 % cream Apply 1 application topically 2 (two) times daily. 60 g 1 03/30/2018 at Unknown time  . nitroGLYCERIN (NITROSTAT) 0.4 MG SL tablet Place 1 tablet (0.4 mg total) under the tongue every 5 (five) minutes as needed for chest pain. 25 tablet 5 03/31/2018 at prn  . pantoprazole (PROTONIX) 40 MG tablet Take 1 tablet (40 mg total) by mouth daily. 90 tablet 3 03/30/2018 at Unknown time  . polyethylene glycol (MIRALAX / GLYCOLAX) packet Take 17  g by mouth daily.   03/30/2018 at Unknown time  . docusate sodium (COLACE) 100 MG capsule Take 200 mg by mouth 2 (two) times daily.    Not Taking at Unknown time  . NEEDLE, DISP, 25 G 25G X 1" MISC by Does not apply route. Use as directed   Not Taking at Unknown time  . Syringe/Needle, Disp, (SYRINGE 3CC/25GX1") 25G X 1" 3 ML MISC by Does not apply route. Use as iinstructed   Not Taking at Unknown time   Scheduled:  . aspirin EC  81 mg Oral Daily  . atorvastatin  40 mg Oral QHS  . clopidogrel  75 mg Oral Daily  . metoprolol succinate  25 mg Oral Daily  . nitroGLYCERIN  0.5 inch Topical Q6H  . pantoprazole  40 mg Oral Daily  . polyethylene glycol  17 g Oral Daily  . sodium chloride flush  3 mL Intravenous Q12H   Infusions:  . sodium chloride    . heparin 850 Units/hr (04/01/18 0300)   PRN: sodium chloride, acetaminophen, nitroGLYCERIN, ondansetron (ZOFRAN) IV, sodium chloride  flush  Assessment: Pharmacy was consulted for heparin dosing for ACS. Trop elevated. No DOAC PTA noted. Patient received therapeutic dose of enoxaparin this morning therefore heparin was deferred until now. Cardiac catheterization this morning shows triple-vessel disease: cardiology is recommending heparin with no loading dose.  Goal of Therapy:  Heparin level 0.3-0.7 units/ml Monitor platelets by anticoagulation protocol: Yes   Plan:  Start heparin infusion at 850 units/hr at 2200 with no loading dose per Dr End Check anti-Xa level 8 hours after infusion begins and daily while on heparin Continue to monitor H&H and platelets  2/19 AM heparin level 0.53. Continue current regimen. Recheck heparin level in 8 hours to confirm.  Burnice Oestreicher S, PharmD 04/01/2018,7:14 AM

## 2018-04-01 NOTE — Progress Notes (Signed)
MD notified about nitro paste. Pt is chest pain free and VSS. MD orders to discontinue to nitro paste and cardio added imdur. I will continue to assess.

## 2018-04-01 NOTE — Progress Notes (Signed)
Patient ID: Jason Velazquez, male   DOB: 11-Feb-1929, 85 y.o.   MRN: [id number]  [employer] PROGRESS NOTE  Jason Velazquez ZJI:[id number] DOB: 1928/11/01 DOA: 03/31/2018 PCP: Tonia Ghent, MD  HPI/Subjective: Patient feeling okay.  No further chest pain since yesterday morning.  Felt better after cardiac catheterization.  Objective: Vitals:   04/01/18 0418 04/01/18 0733  BP: 114/67 (!) 141/69  Pulse: 67 67  Resp: 18 15  Temp: 98.6 F (37 C) 97.8 F (36.6 C)  SpO2: 94% 97%    Filed Weights   03/31/18 0233 03/31/18 1009 04/01/18 0418  Weight: 73.5 kg 72.6 kg 83.9 kg    ROS: Review of Systems  Constitutional: Negative for chills and fever.  Eyes: Negative for blurred vision.  Respiratory: Negative for cough and shortness of breath.   Cardiovascular: Negative for chest pain.  Gastrointestinal: Negative for abdominal pain, constipation, diarrhea, nausea and vomiting.  Genitourinary: Negative for dysuria.  Musculoskeletal: Negative for joint pain.  Neurological: Negative for dizziness and headaches.   Exam: Physical Exam  Constitutional: He is oriented to person, place, and time.  HENT:  Nose: No mucosal edema.  Mouth/Throat: No oropharyngeal exudate or posterior oropharyngeal edema.  Eyes: Pupils are equal, round, and reactive to light. Conjunctivae, EOM and lids are normal.  Neck: No JVD present. Carotid bruit is not present. No edema present. No thyroid mass and no thyromegaly present.  Cardiovascular: S1 normal and S2 normal. Exam reveals no gallop.  No murmur heard. Pulses:      Dorsalis pedis pulses are 1+ on the right side and 1+ on the left side.  Respiratory: No respiratory distress. He has no wheezes. He has no rhonchi. He has no rales.  GI: Soft. Bowel sounds are normal. There is no abdominal tenderness.  Musculoskeletal:     Right ankle: He exhibits no swelling.     Left ankle: He exhibits no swelling.  Lymphadenopathy:    He has no cervical adenopathy.   Neurological: He is alert and oriented to person, place, and time. No cranial nerve deficit.  Skin: Skin is warm. No rash noted. Nails show no clubbing.  Psychiatric: He has a normal mood and affect.      Data Reviewed: Basic Metabolic Panel: Recent Labs  Lab 03/31/18 0223 03/31/18 0552 04/01/18 0520  NA 135 135 135  K 4.1 4.2 4.3  CL 102 103 106  CO2 29 29 25   GLUCOSE 110* 110* 104*  BUN 11 10 11   CREATININE 1.07 1.01 0.92  CALCIUM 8.5* 8.4* 8.4*   CBC: Recent Labs  Lab 03/31/18 0223 03/31/18 0552 04/01/18 0520  WBC 6.7 7.1 9.4  NEUTROABS 4.2  --   --   HGB 14.2 13.8 13.9  HCT 43.2 41.9 42.9  MCV 91.9 92.9 93.7  PLT 223 210 214   Cardiac Enzymes: Recent Labs  Lab 03/31/18 0552 03/31/18 1153 03/31/18 1739 04/01/18 0520 04/01/18 1449  TROPONINI 0.30* 1.04* 2.21* 3.31* 3.92*     Studies: Dg Chest 2 View  Result Date: 03/31/2018 CLINICAL DATA:  Central chest pain EXAM: CHEST - 2 VIEW COMPARISON:  09/21/2013 FINDINGS: The patient is status post CABG. Heart size is top normal in appearance. Mild aortic atherosclerosis with uncoiling. A double density over the cardiac silhouette may represent a hiatal hernia. The lungs are mildly hyperinflated without acute pulmonary consolidation or CHF. No effusion or pneumothorax. Degenerative change is noted the dorsal spine. IMPRESSION: No active cardiopulmonary disease. Electronically Signed   By: Shanon Brow  Randel Pigg M.D.   On: 03/31/2018 03:57    Scheduled Meds: . aspirin EC  81 mg Oral Daily  . atorvastatin  40 mg Oral QHS  . clopidogrel  75 mg Oral Daily  . isosorbide mononitrate  15 mg Oral Daily  . metoprolol succinate  25 mg Oral Daily  . pantoprazole  40 mg Oral Daily  . polyethylene glycol  17 g Oral Daily  . sodium chloride flush  3 mL Intravenous Q12H   Continuous Infusions: . sodium chloride    . heparin 850 Units/hr (04/01/18 1429)    Assessment/Plan:  1. NSTEMI with history of CAD.  Cardiac catheterization  shows triple-vessel disease.  Troponin still elevating.  Cardiology recommending medical management with aspirin, Plavix, atorvastatin, isosorbide mononitrate and low-dose metoprolol.  Patient currently on heparin drip. 2. History of CVA on aspirin, Plavix and atorvastatin 3. Peripheral vascular disease.  On aspirin, Plavix and statin 4. Essential hypertension atenolol switched over to metoprolol 5. GERD on PPI  Code Status:     Code Status Orders  (From admission, onward)         Start     Ordered   03/31/18 1552  Do not attempt resuscitation (DNR)  Continuous    Question Answer Comment  In the event of cardiac or respiratory ARREST Do not call a "code blue"   In the event of cardiac or respiratory ARREST Do not perform Intubation, CPR, defibrillation or ACLS   In the event of cardiac or respiratory ARREST Use medication by any route, position, wound care, and other measures to relive pain and suffering. May use oxygen, suction and manual treatment of airway obstruction as needed for comfort.      03/31/18 1551        Code Status History    Date Active Date Inactive Code Status Order ID Comments User Context   03/31/2018 1287 03/31/2018 1551 Full Code [id number]  Nelva Bush, MD Inpatient   03/31/2018 0546 03/31/2018 1403 DNR [id number]  Sedalia Muta, MD ED   05/26/2013 1259 05/27/2013 2148 Full Code [id number]  Donne Hazel, MD ED   04/09/2011 1031 04/11/2011 1648 Full Code [id number]  Jannifer Franklin, RN Inpatient    Advance Directive Documentation     Most Recent Value  Type of Advance Directive  Healthcare Power of Attorney, Living will  Pre-existing out of facility DNR order (yellow form or pink MOST form)  -  "MOST" Form in Place?  -     Family Communication: Family at bedside Disposition Plan: Cardiology wanted to watch again overnight.  Consultants:  Cardiology  Procedures:  Cardiac catheterization  Time spent: 27 minutes  [place]

## 2018-04-01 NOTE — Progress Notes (Deleted)
ANTICOAGULATION CONSULT NOTE  Pharmacy Consult for heparin Indication: chest pain/ACS  Patient Measurements: Height: 5\' 4"  (162.6 cm) Weight: 184 lb 14.4 oz (83.9 kg) IBW/kg (Calculated) : 59.2 Heparin Dosing Weight: 72.6 kg  Vital Signs: Temp: 98.6 F (37 C) (02/19 0418) Temp Source: Oral (02/19 0418) BP: 114/67 (02/19 0418) Pulse Rate: 67 (02/19 0418)  Labs: Recent Labs    03/31/18 0223 03/31/18 0552 03/31/18 1153 03/31/18 1739 04/01/18 0520  HGB 14.2 13.8  --   --  13.9  HCT 43.2 41.9  --   --  42.9  PLT 223 210  --   --  214  APTT  --   --  33  --   --   LABPROT  --  13.1  --   --   --   INR  --  1.00  --   --   --   HEPARINUNFRC  --   --   --   --  0.53  CREATININE 1.07 1.01  --   --   --   TROPONINI 0.28* 0.30* 1.04* 2.21*  --     Estimated Creatinine Clearance: 50.4 mL/min (by C-G formula based on SCr of 1.01 mg/dL).   Medical History: Past Medical History:  Diagnosis Date  . Allergic rhinitis   . Benign prostatic hypertrophy   . Coronary artery disease CARDIOLOGIST -- DR Daneen Schick  . Duodenal diverticulum 2003  . GERD (gastroesophageal reflux disease)   . H/O hiatal hernia 2003  . Heart murmur   . Hepatic lesion 2004  . History of melanoma excision    FACE  . Hypercholesteremia   . Hypertension   . Left carotid stenosis    > 50%  PER DUPLEX  03-28-2011  . Mild mitral regurgitation   . Perianal fistula   . S/P CABG x 4    1988  . S/P drug eluting coronary stent placement    POST CABG--  STENTING 2000  AND RESTENTING 2006 FOR REINSTENT STENOSIS  . Stroke [facility])     Medications:  Facility-Administered Medications Prior to Admission  Medication Dose Route Frequency Provider Last Rate Last Dose  . betamethasone acetate-betamethasone sodium phosphate (CELESTONE) injection 12 mg  12 mg Intramuscular Once Daylene Katayama M, DPM      . betamethasone acetate-betamethasone sodium phosphate (CELESTONE) injection 3 mg  3 mg Intramuscular Once Edrick Kins, DPM       Medications Prior to Admission  Medication Sig Dispense Refill Last Dose  . atenolol (TENORMIN) 25 MG tablet Take 25 mg by mouth daily after breakfast.   03/30/2018 at Unknown time  . atorvastatin (LIPITOR) 20 MG tablet Take 1 tablet (20 mg total) by mouth at bedtime. 90 tablet 3 03/30/2018 at Unknown time  . clopidogrel (PLAVIX) 75 MG tablet Take 75 mg by mouth daily.   03/30/2018 at Unknown time  . cyanocobalamin (,VITAMIN B-12,) 1000 MCG/ML injection INJECT 1ML IM MONTHLY 12 mL 1 Past Month at Unknown time  . ketoconazole (NIZORAL) 2 % cream Apply 1 application topically 2 (two) times daily. 60 g 1 03/30/2018 at Unknown time  . nitroGLYCERIN (NITROSTAT) 0.4 MG SL tablet Place 1 tablet (0.4 mg total) under the tongue every 5 (five) minutes as needed for chest pain. 25 tablet 5 03/31/2018 at prn  . pantoprazole (PROTONIX) 40 MG tablet Take 1 tablet (40 mg total) by mouth daily. 90 tablet 3 03/30/2018 at Unknown time  . polyethylene glycol (MIRALAX / GLYCOLAX) packet Take 17  g by mouth daily.   03/30/2018 at Unknown time  . docusate sodium (COLACE) 100 MG capsule Take 200 mg by mouth 2 (two) times daily.    Not Taking at Unknown time  . NEEDLE, DISP, 25 G 25G X 1" MISC by Does not apply route. Use as directed   Not Taking at Unknown time  . Syringe/Needle, Disp, (SYRINGE 3CC/25GX1") 25G X 1" 3 ML MISC by Does not apply route. Use as iinstructed   Not Taking at Unknown time   Scheduled:  . aspirin EC  81 mg Oral Daily  . atorvastatin  40 mg Oral QHS  . clopidogrel  75 mg Oral Daily  . metoprolol succinate  25 mg Oral Daily  . nitroGLYCERIN  0.5 inch Topical Q6H  . pantoprazole  40 mg Oral Daily  . polyethylene glycol  17 g Oral Daily  . sodium chloride flush  3 mL Intravenous Q12H   Infusions:  . sodium chloride    . heparin 850 Units/hr (04/01/18 0300)   PRN: sodium chloride, acetaminophen, nitroGLYCERIN, ondansetron (ZOFRAN) IV, sodium chloride  flush  Assessment: Pharmacy was consulted for heparin dosing for ACS. Trop elevated. No DOAC PTA noted. Patient received therapeutic dose of enoxaparin this morning therefore heparin was deferred until now. Cardiac catheterization 2/18 shows triple-vessel disease.   2/19 0520 Heparin level 0.53 - therapeutic   Goal of Therapy:  Heparin level 0.3-0.7 units/ml Monitor platelets by anticoagulation protocol: Yes   Plan:  Will continue current rate and recheck heparin level in 8 hours.   Oswald Hillock, PharmD, BCPS 04/01/2018,7:14 AM

## 2018-04-01 NOTE — Progress Notes (Signed)
Cardiovascular and Pulmonary Nurse Navigator Note:   85 year old male with hx of CAD s/p CABG (1988) with subsequent PCI to proximal LCx/OM, HTN, HLD, Carotid Artery Stenosis with prior stroke with some residual memory deficit, and BPH who presented with chest pain and elevated troponin.  Patient ruled in for NSTEMI.  (Patient is a NEVER smoker.)  Patient underwent Cardiac Cath on 03/31/2018.    Laural Golden  CARDIAC CATHETERIZATION  Order# [id number]  Reading physician: Nelva Bush, MD Ordering physician: Nelva Bush, MD Study date: 03/31/18  Physicians   Panel Physicians Referring Physician Case Authorizing Physician  End, Harrell Gave, MD (Primary)  End, Harrell Gave, MD  Procedures   LEFT HEART CATH AND CORS/GRAFTS ANGIOGRAPHY  Conclusion   Conclusions: 1. Severe three-vessel coronary artery disease including chronic total occlusion of the ostial and mid LAD, in-stent restenosis of overlapping proximal LCx stents complicated by stent fracture, sequential 80% OM2 stenoses, 80% jailed AV groove LCx stenosis, 70% proximal RCA lesion, sequential 60% and 70% ostial and distal RPDA stenoses and chronic total occlusion of the rPL. 2. Widely patent LIMA to LAD and SVG to rPL. 3. Occlusion of sequential SVG to diagonal branches.  I suspect this is the patient's culprit lesion with acute occlusion on chronic subtotal occlusion, given that collaterals have already developed. 4. Mildly to moderately reduced left ventricular systolic function (EF 65%) with mid anterior akinesis. 5. Mildly reduced left ventricular filling pressure.  Recommendations: 1. Given diffuse disease and likely acute total occlusion superimposed on subtotal occlusion of SVG to diagonal branches in a graft that is more than 83 years old, I favor medical therapy. 2. Dual antiplatelet therapy with aspirin and clopidogrel for at least 12 months. 3. Continue IV heparin for 48 hours. 4. Obtain echocardiogram. 5. Transition  atenolol to metoprolol succinate 25 mg daily. 6. If patient has recurrent chest pain, consider addition of long-acting nitrate.  If refractory symptoms occur, one would need to consider PCI to LAD, LCx, and or RCA.  Nelva Bush, MD [facility] Pager: [phone]   Rounded on patient to discuss Cardiac Rehab. Patient sitting in recliner chair.  Daughter, Lucinda Dell, former VP of Nursing at [employer], at bedside.  Patient informed this RN that he exercises almost daily at [facility] facilities.  Informed patient that his cardiologist has referred him to outpatient Cardiac Rehab. Patient previously participated in Cardiac Rehab at [facility] in [place] after his CABG. Patient is interested in participating in the program here at [facility].   Patient's daughter would like patient to participate in Cardiac Rehab as she attributes him doing so well after his bypass all those years ago to Cardiac Rehab.  An overview of the program was provided. Brochure, informational letter, class and orientation times, and CPT billing codes given to patient.  A visitor came in to see the patient.  This RN continued the conversation with daughter.  Daughter would like to be contacted by Cardiac Rehab to schedule patient's appt, as patient has some memory issues.  Daughter's phone number is [phone]. Not ready to schedule at this time, as  Patient / daughter needs to check patient's schedule / appointments.  Cardiac Rehab dept notified of this request.   ?  Patient / daughter appreciative of the information.  ? Roanna Epley, RN, BSN, [place]  [facility] Cardiac & Pulmonary Rehab  Cardiovascular & Pulmonary Nurse Navigator  Direct Line: [phone]  Department Phone #: [phone] Fax: [phone]  Email Address: [email or web address]

## 2018-04-02 DIAGNOSIS — I255 Ischemic cardiomyopathy: Secondary | ICD-10-CM

## 2018-04-02 LAB — CBC
HCT: 38.1 % — ABNORMAL LOW (ref 39.0–52.0)
Hemoglobin: 12.6 g/dL — ABNORMAL LOW (ref 13.0–17.0)
MCH: 30.6 pg (ref 26.0–34.0)
MCHC: 33.1 g/dL (ref 30.0–36.0)
MCV: 92.5 fL (ref 80.0–100.0)
Platelets: 174 10*3/uL (ref 150–400)
RBC: 4.12 MIL/uL — ABNORMAL LOW (ref 4.22–5.81)
RDW: 12.6 % (ref 11.5–15.5)
WBC: 8.1 10*3/uL (ref 4.0–10.5)
nRBC: 0 % (ref 0.0–0.2)

## 2018-04-02 LAB — HEPARIN LEVEL (UNFRACTIONATED): HEPARIN UNFRACTIONATED: 0.5 [IU]/mL (ref 0.30–0.70)

## 2018-04-02 LAB — TROPONIN I: Troponin I: 3.88 ng/mL (ref <0.03)

## 2018-04-02 MED ORDER — HEPARIN SODIUM (PORCINE) 5000 UNIT/ML IJ SOLN: 5000.0000 [IU] | Freq: Three times a day (TID) | INTRAMUSCULAR | Status: DC

## 2018-04-02 MED ORDER — METOPROLOL SUCCINATE ER 25 MG PO TB24: 25.0000 mg | ORAL_TABLET | Freq: Every day | ORAL | 0 refills | Status: DC

## 2018-04-02 MED ORDER — ASPIRIN 81 MG PO TBEC: 81.0000 mg | DELAYED_RELEASE_TABLET | Freq: Every day | ORAL | 0 refills | Status: DC

## 2018-04-02 MED ORDER — ISOSORBIDE MONONITRATE ER 30 MG PO TB24: 15.0000 mg | ORAL_TABLET | Freq: Every day | ORAL | 0 refills | Status: DC

## 2018-04-02 MED ORDER — ATORVASTATIN CALCIUM 40 MG PO TABS: 40.0000 mg | ORAL_TABLET | Freq: Every day | ORAL | 0 refills | Status: DC

## 2018-04-02 MED ORDER — NITROGLYCERIN 0.4 MG SL SUBL: 0.4000 mg | SUBLINGUAL_TABLET | SUBLINGUAL | 0 refills | Status: DC | PRN

## 2018-04-02 NOTE — Progress Notes (Signed)
Progress Note  Patient Name: Jason Velazquez Date of Encounter: 04/02/2018  Primary Cardiologist: Nelva Bush, MD   Subjective   Feels well.  No CP, shortness of breath, or palpitations.  Inpatient Medications    Scheduled Meds: . aspirin EC  81 mg Oral Daily  . atorvastatin  40 mg Oral QHS  . clopidogrel  75 mg Oral Daily  . heparin injection (subcutaneous)  5,000 Units Subcutaneous Q8H  . isosorbide mononitrate  15 mg Oral Daily  . metoprolol succinate  25 mg Oral Daily  . pantoprazole  40 mg Oral Daily  . polyethylene glycol  17 g Oral Daily  . sodium chloride flush  3 mL Intravenous Q12H   Continuous Infusions: . sodium chloride     PRN Meds: sodium chloride, acetaminophen, nitroGLYCERIN, ondansetron (ZOFRAN) IV, sodium chloride flush   Vital Signs    Vitals:   04/01/18 1934 04/02/18 0242 04/02/18 0433 04/02/18 0749  BP: 113/61  122/74 137/78  Pulse: 73  68 68  Resp: 17  20 16   Temp: 98.2 F (36.8 C)  98.9 F (37.2 C) 98.2 F (36.8 C)  TempSrc: Oral  Oral Oral  SpO2: 95%  94% 96%  Weight:  81.2 kg    Height:        Intake/Output Summary (Last 24 hours) at 04/02/2018 0826 Last data filed at 04/02/2018 0200 Gross per 24 hour  Intake 1177.55 ml  Output 200 ml  Net 977.55 ml   Last 3 Weights 04/02/2018 04/01/2018 03/31/2018  Weight (lbs) 179 lb 1.6 oz 184 lb 14.4 oz 160 lb  Weight (kg) 81.239 kg 83.87 kg 72.576 kg      Telemetry    NSR with PAC's and PVC's. - Personally Reviewed  ECG    No new tracing  Physical Exam   GEN: No acute distress.   Neck: No JVD Cardiac: RRR, no murmurs, rubs, or gallops.  Respiratory: Clear to auscultation bilaterally. GI: Soft, nontender, non-distended  MS: No edema; No deformity.  Left radial clean and dry.  No hematoma. Neuro:  Nonfocal  Psych: Normal affect   Labs    Chemistry Recent Labs  Lab 03/31/18 0223 03/31/18 0552 04/01/18 0520  NA 135 135 135  K 4.1 4.2 4.3  CL 102 103 106  CO2 29 29 25     GLUCOSE 110* 110* 104*  BUN 11 10 11   CREATININE 1.07 1.01 0.92  CALCIUM 8.5* 8.4* 8.4*  GFRNONAA >60 >60 >60  GFRAA >60 >60 >60  ANIONGAP 4* 3* 4*     Hematology Recent Labs  Lab 03/31/18 0552 04/01/18 0520 04/02/18 0341  WBC 7.1 9.4 8.1  RBC 4.51 4.58 4.12*  HGB 13.8 13.9 12.6*  HCT 41.9 42.9 38.1*  MCV 92.9 93.7 92.5  MCH 30.6 30.3 30.6  MCHC 32.9 32.4 33.1  RDW 12.5 12.8 12.6  PLT 210 214 174    Cardiac Enzymes Recent Labs  Lab 03/31/18 1739 04/01/18 0520 04/01/18 1449 04/02/18 0341  TROPONINI 2.21* 3.31* 3.92* 3.88*   No results for input(s): TROPIPOC in the last 168 hours.   BNPNo results for input(s): BNP, PROBNP in the last 168 hours.   DDimer No results for input(s): DDIMER in the last 168 hours.   Radiology    No results found.  Cardiac Studies   Echo (03/31/2018): 1. The left ventricle has normal systolic function, with an ejection fraction of 55-60%. The cavity size was normal. There is moderately increased left ventricular wall thickness. Left ventricular  diastolic Doppler parameters are consistent with  impaired relaxation Indeterminent filling pressures.  2. The right ventricle has normal systolic function. The cavity was normal. There is no increase in right ventricular wall thickness.  3. The mitral valve is degenerative. Moderate thickening of the mitral valve leaflet. Mild calcification of the mitral valve leaflet.  4. The tricuspid valve is normal in structure. Tricuspid valve regurgitation is mild-moderate.  5. The aortic valve is tricuspid Mild thickening of the aortic valve Mild calcification of the aortic valve.  6. Mild hypokinesis of the left ventricular anterolateral wall.  7. The interatrial septum was not well visualized.  LHC (03/31/2018): 1. Severe three-vessel coronary artery disease including chronic total occlusion of the ostial and mid LAD, in-stent restenosis of overlapping proximal LCx stents complicated by stent fracture,  sequential 80% OM2 stenoses, 80% jailed AV groove LCx stenosis, 70% proximal RCA lesion, sequential 60% and 70% ostial and distal RPDA stenoses and chronic total occlusion of the rPL. 2. Widely patent LIMA to LAD and SVG to rPL. 3. Occlusion of sequential SVG to diagonal branches.  I suspect this is the patient's culprit lesion with acute occlusion on chronic subtotal occlusion, given that collaterals have already developed. 4. Mildly to moderately reduced left ventricular systolic function (EF 44%) with mid anterior akinesis. 5. Mildly reduced left ventricular filling pressure.  Diagnostic  Dominance: Right      Patient Profile     85 y.o. male with a history of CAD status post CABG (LIMA to LAD, sequential SVG to D1 and D2, and SVG to RPL) and subsequent PCI to proximal LCx/OM, hypertension, hyperlipidemia, carotid artery stenosis with prior stroke and some residual memory deficit, and BPH, who presented on February 18 with non-STEMI and subsequently underwent catheterization revealing an occluded sequential graft to the diagonal branches, which will be medically managed.  Assessment & Plan    NSTEMI Feels well without further chest pain.  Troponin peaked overnight.  OK for d/c today.  Continue ASA and clopidogrel x 12 months.  Heparin d/c'ed this morning (48 hours of AC completed).  Continue isosorbide mononitrate 15 mg daily; can escalate in the future if needed.  If patient has refractory angina, PCI to LAD, LCX, and/or RCA would need to be considered in the future.  Continue atorvastatin 40 mg daily.  Ischemic cardiomyopathy Anterolateral hypokinesis noted consistent with diagonal disease.  Overall LVEF normal.  Patient appears euvolemic.  Continue metoprolol succinate 25 mg daily.  No standing diuretic needed.  Hypertension BP normal today.  Continue current regimen.  [facility] will sign off.  Patient is stable for discharge. Medication Recommendations:  See  above. Other recommendations (labs, testing, etc):  None. Follow up as an outpatient:  F/u with me or APP in ~2 weeks.  For questions or updates, please contact [place] Please consult [email or web address] for contact info under [facility].    Signed, Nelva Bush, MD  04/02/2018, 8:26 AM

## 2018-04-02 NOTE — Progress Notes (Signed)
ANTICOAGULATION CONSULT NOTE  Pharmacy Consult for heparin Indication: chest pain/ACS  Patient Measurements: Height: 5\' 4"  (162.6 cm) Weight: 179 lb 1.6 oz (81.2 kg) IBW/kg (Calculated) : 59.2 Heparin Dosing Weight: 72.6 kg  Vital Signs: Temp: 98.9 F (37.2 C) (02/20 0433) Temp Source: Oral (02/20 0433) BP: 122/74 (02/20 0433) Pulse Rate: 68 (02/20 0433)  Labs: Recent Labs    03/31/18 0223 03/31/18 0552 03/31/18 1153  04/01/18 0520 04/01/18 1247 04/01/18 1449 04/02/18 0341  HGB 14.2 13.8  --   --  13.9  --   --  12.6*  HCT 43.2 41.9  --   --  42.9  --   --  38.1*  PLT 223 210  --   --  214  --   --  174  APTT  --   --  33  --   --   --   --   --   LABPROT  --  13.1  --   --   --   --   --   --   INR  --  1.00  --   --   --   --   --   --   HEPARINUNFRC  --   --   --   --  0.53 0.54  --  0.50  CREATININE 1.07 1.01  --   --  0.92  --   --   --   TROPONINI 0.28* 0.30* 1.04*   < > 3.31*  --  3.92* 3.88*   < > = values in this interval not displayed.    Estimated Creatinine Clearance: 54.4 mL/min (by C-G formula based on SCr of 0.92 mg/dL).   Medical History: Past Medical History:  Diagnosis Date  . Allergic rhinitis   . Benign prostatic hypertrophy   . Coronary artery disease CARDIOLOGIST -- DR Daneen Schick  . Duodenal diverticulum 2003  . GERD (gastroesophageal reflux disease)   . H/O hiatal hernia 2003  . Heart murmur   . Hepatic lesion 2004  . History of melanoma excision    FACE  . Hypercholesteremia   . Hypertension   . Left carotid stenosis    > 50%  PER DUPLEX  03-28-2011  . Mild mitral regurgitation   . Perianal fistula   . S/P CABG x 4    1988  . S/P drug eluting coronary stent placement    POST CABG--  STENTING 2000  AND RESTENTING 2006 FOR REINSTENT STENOSIS  . Stroke [facility])     Medications:  Facility-Administered Medications Prior to Admission  Medication Dose Route Frequency Provider Last Rate Last Dose  . betamethasone  acetate-betamethasone sodium phosphate (CELESTONE) injection 12 mg  12 mg Intramuscular Once Daylene Katayama M, DPM      . betamethasone acetate-betamethasone sodium phosphate (CELESTONE) injection 3 mg  3 mg Intramuscular Once Edrick Kins, DPM       Medications Prior to Admission  Medication Sig Dispense Refill Last Dose  . atenolol (TENORMIN) 25 MG tablet Take 25 mg by mouth daily after breakfast.   03/30/2018 at Unknown time  . atorvastatin (LIPITOR) 20 MG tablet Take 1 tablet (20 mg total) by mouth at bedtime. 90 tablet 3 03/30/2018 at Unknown time  . clopidogrel (PLAVIX) 75 MG tablet Take 75 mg by mouth daily.   03/30/2018 at Unknown time  . cyanocobalamin (,VITAMIN B-12,) 1000 MCG/ML injection INJECT 1ML IM MONTHLY 12 mL 1 Past Month at Unknown time  . ketoconazole (NIZORAL) 2 %  cream Apply 1 application topically 2 (two) times daily. 60 g 1 03/30/2018 at Unknown time  . nitroGLYCERIN (NITROSTAT) 0.4 MG SL tablet Place 1 tablet (0.4 mg total) under the tongue every 5 (five) minutes as needed for chest pain. 25 tablet 5 03/31/2018 at prn  . pantoprazole (PROTONIX) 40 MG tablet Take 1 tablet (40 mg total) by mouth daily. 90 tablet 3 03/30/2018 at Unknown time  . polyethylene glycol (MIRALAX / GLYCOLAX) packet Take 17 g by mouth daily.   03/30/2018 at Unknown time  . docusate sodium (COLACE) 100 MG capsule Take 200 mg by mouth 2 (two) times daily.    Not Taking at Unknown time  . NEEDLE, DISP, 25 G 25G X 1" MISC by Does not apply route. Use as directed   Not Taking at Unknown time  . Syringe/Needle, Disp, (SYRINGE 3CC/25GX1") 25G X 1" 3 ML MISC by Does not apply route. Use as iinstructed   Not Taking at Unknown time   Scheduled:  . aspirin EC  81 mg Oral Daily  . atorvastatin  40 mg Oral QHS  . clopidogrel  75 mg Oral Daily  . isosorbide mononitrate  15 mg Oral Daily  . metoprolol succinate  25 mg Oral Daily  . pantoprazole  40 mg Oral Daily  . polyethylene glycol  17 g Oral Daily  . sodium  chloride flush  3 mL Intravenous Q12H   Infusions:  . sodium chloride    . heparin 850 Units/hr (04/02/18 0339)   PRN: sodium chloride, acetaminophen, nitroGLYCERIN, ondansetron (ZOFRAN) IV, sodium chloride flush  Assessment: Pharmacy was consulted for heparin dosing for ACS. Trop elevated. No DOAC PTA noted. Patient received therapeutic dose of enoxaparin this morning therefore heparin was deferred until now. Cardiac catheterization this morning shows triple-vessel disease: cardiology is recommending heparin with no loading dose.  2/18 @ 2200 heparin infusion stated @ 850 units/hr 2/19 @ 0520 HL:0.53- level; therapeutic x 1   Goal of Therapy:  Heparin level 0.3-0.7 units/ml Monitor platelets by anticoagulation protocol: Yes   Plan:  2/19 @ 1247 Heparin Level: 0.54. Level is therapeutic x 2. Will continue current infusion rate of 850 units/hr  Check anti-Xa level and CBC daily while on heparin Continue to monitor H&H and platelets  2/20 AM heparin level 0.50. Continue current regimen. Recheck heparin level and CBC with tomorrow AM labs.  Eloise Harman, PharmD, BCPS Clinical Pharmacist 04/02/2018 4:57 AM

## 2018-04-02 NOTE — Discharge Summary (Signed)
[facility] [place] NAME: Jason Velazquez    MR#:  [id number]  DATE OF BIRTH:  [birth year]  DATE OF ADMISSION:  03/31/2018 ADMITTING PHYSICIAN: Sedalia Muta, MD  DATE OF DISCHARGE: 04/02/2018 10:34 AM  PRIMARY CARE PHYSICIAN: Tonia Ghent, MD    ADMISSION DIAGNOSIS:  NSTEMI (non-ST elevated myocardial infarction) ([place]) [I21.4]  DISCHARGE DIAGNOSIS:  Active Problems:   NSTEMI (non-ST elevated myocardial infarction) ([place])   SECONDARY DIAGNOSIS:   Past Medical History:  Diagnosis Date  . Allergic rhinitis   . Benign prostatic hypertrophy   . Coronary artery disease CARDIOLOGIST -- DR Daneen Schick  . Duodenal diverticulum 2003  . GERD (gastroesophageal reflux disease)   . H/O hiatal hernia 2003  . Heart murmur   . Hepatic lesion 2004  . History of melanoma excision    FACE  . Hypercholesteremia   . Hypertension   . Left carotid stenosis    > 50%  PER DUPLEX  03-28-2011  . Mild mitral regurgitation   . Perianal fistula   . S/P CABG x 4    1988  . S/P drug eluting coronary stent placement    POST CABG--  STENTING 2000  AND RESTENTING 2006 FOR REINSTENT STENOSIS  . Stroke [facility])     HOSPITAL COURSE:   1.  NSTEMI with history of CAD.  Cardiac catheterization showed triple-vessel disease.  Troponin peaked and starting to come down.  Cardiology recommended medical management with aspirin, Plavix, atorvastatin increased dose, Imdur and metoprolol.  Cardiac rehab set up 2.  History of CVA on aspirin Plavix and atorvastatin 3.  History of peripheral vascular disease on aspirin Plavix and statin 4.  Essential hypertension.  Atenolol switched over to metoprolol 5.  GERD on PPI  DISCHARGE CONDITIONS:   Satisfactory  CONSULTS OBTAINED:  Treatment Team:  Nelva Bush, MD  DRUG ALLERGIES:   Allergies  Allergen Reactions  . Iodine Rash    Rash when applied to skin  . Niacin Itching  . Niacin And Related Hives and Other  (See Comments)    FLUSHING  . Adhesive [Tape] Rash    DISCHARGE MEDICATIONS:   Allergies as of 04/02/2018      Reactions   Iodine Rash   Rash when applied to skin   Niacin Itching   Niacin And Related Hives, Other (See Comments)   FLUSHING   Adhesive [tape] Rash      Medication List    STOP taking these medications   atenolol 25 MG tablet Commonly known as:  TENORMIN   NEEDLE (DISP) 25 G 25G X 1" Misc   SYRINGE 3CC/25GX1" 25G X 1" 3 ML Misc     TAKE these medications   aspirin 81 MG EC tablet Take 1 tablet (81 mg total) by mouth daily.   atorvastatin 40 MG tablet Commonly known as:  LIPITOR Take 1 tablet (40 mg total) by mouth at bedtime. What changed:    medication strength  how much to take   clopidogrel 75 MG tablet Commonly known as:  PLAVIX Take 75 mg by mouth daily.   cyanocobalamin 1000 MCG/ML injection Commonly known as:  (VITAMIN B-12) INJECT 1ML IM MONTHLY   docusate sodium 100 MG capsule Commonly known as:  COLACE Take 200 mg by mouth 2 (two) times daily.   isosorbide mononitrate 30 MG 24 hr tablet Commonly known as:  IMDUR Take 0.5 tablets (15 mg total) by mouth daily.   ketoconazole 2 % cream  Commonly known as:  NIZORAL Apply 1 application topically 2 (two) times daily.   metoprolol succinate 25 MG 24 hr tablet Commonly known as:  TOPROL-XL Take 1 tablet (25 mg total) by mouth daily. Take with or immediately following a meal.   nitroGLYCERIN 0.4 MG SL tablet Commonly known as:  NITROSTAT Place 1 tablet (0.4 mg total) under the tongue every 5 (five) minutes as needed for chest pain.   pantoprazole 40 MG tablet Commonly known as:  PROTONIX Take 1 tablet (40 mg total) by mouth daily.   polyethylene glycol packet Commonly known as:  MIRALAX / GLYCOLAX Take 17 g by mouth daily.        DISCHARGE INSTRUCTIONS:   Follow-up PMD 5 days Follow-up cardiology 1 week  If you experience worsening of your admission symptoms, develop  shortness of breath, life threatening emergency, suicidal or homicidal thoughts you must seek medical attention immediately by calling 911 or calling your MD immediately  if symptoms less severe.  You Must read complete instructions/literature along with all the possible adverse reactions/side effects for all the Medicines you take and that have been prescribed to you. Take any new Medicines after you have completely understood and accept all the possible adverse reactions/side effects.   Please note  You were cared for by a hospitalist during your hospital stay. If you have any questions about your discharge medications or the care you received while you were in the hospital after you are discharged, you can call the unit and asked to speak with the hospitalist on call if the hospitalist that took care of you is not available. Once you are discharged, your primary care physician will handle any further medical issues. Please note that NO REFILLS for any discharge medications will be authorized once you are discharged, as it is imperative that you return to your primary care physician (or establish a relationship with a primary care physician if you do not have one) for your aftercare needs so that they can reassess your need for medications and monitor your lab values.    Today   CHIEF COMPLAINT:   Chief Complaint  Patient presents with  . Chest Pain    HISTORY OF PRESENT ILLNESS:  Jason Velazquez  is a 85 y.o. male came in with chest pain and found to have an NSTEMI   VITAL SIGNS:  Blood pressure 137/78, pulse 68, temperature 98.2 F (36.8 C), temperature source Oral, resp. rate 16, height 5\' 4"  (1.626 m), weight 81.2 kg, SpO2 96 %.   PHYSICAL EXAMINATION:  GENERAL:  85 y.o.-year-old patient lying in the bed with no acute distress.  EYES: Pupils equal, round, reactive to light and accommodation. No scleral icterus. Extraocular muscles intact.  HEENT: Head atraumatic, normocephalic.  Oropharynx and nasopharynx clear.  NECK:  Supple, no jugular venous distention. No thyroid enlargement, no tenderness.  LUNGS: Normal breath sounds bilaterally, no wheezing, rales,rhonchi or crepitation. No use of accessory muscles of respiration.  CARDIOVASCULAR: S1, S2 normal. No murmurs, rubs, or gallops.  ABDOMEN: Soft, non-tender, non-distended. Bowel sounds present. No organomegaly or mass.  EXTREMITIES: No pedal edema, cyanosis, or clubbing.  NEUROLOGIC: Cranial nerves II through XII are intact. Muscle strength 5/5 in all extremities. Sensation intact. Gait not checked.  PSYCHIATRIC: The patient is alert and oriented x 3.  SKIN: No obvious rash, lesion, or ulcer.   DATA REVIEW:   CBC Recent Labs  Lab 04/02/18 0341  WBC 8.1  HGB 12.6*  HCT 38.1*  PLT  [place]  Lab 04/01/18 0520  NA 135  K 4.3  CL 106  CO2 25  GLUCOSE 104*  BUN 11  CREATININE 0.92  CALCIUM 8.4*    Cardiac Enzymes Recent Labs  Lab 04/02/18 0341  TROPONINI 3.88*    Microbiology Results  Results for orders placed or performed during the hospital encounter of 04/03/11  Surgical pcr screen     Status: None   Collection Time: 04/03/11  2:54 PM  Result Value Ref Range Status   MRSA, PCR NEGATIVE NEGATIVE Final   Staphylococcus aureus NEGATIVE NEGATIVE Final    Comment:        The Xpert SA Assay (FDA approved for NASAL specimens only), is one component of a comprehensive surveillance program.  It is not intended to diagnose infection nor to guide or monitor treatment.     Management plans discussed with the patient, family (yesterday about discharge today) and they are in agreement.  CODE STATUS:  Code Status History    Date Active Date Inactive Code Status Order ID Comments User Context   03/31/2018 2197 04/02/2018 1335 DNR [id number]  Nelva Bush, MD Inpatient   03/31/2018 1403 03/31/2018 1551 Full Code [id number]  Nelva Bush, MD Inpatient   03/31/2018 0546  03/31/2018 1403 DNR [id number]  Sedalia Muta, MD ED   05/26/2013 1259 05/27/2013 2148 Full Code [id number]  Donne Hazel, MD ED   04/09/2011 1031 04/11/2011 1648 Full Code [id number]  Jannifer Franklin, RN Inpatient    Questions for Most Recent Historical Code Status (Order [id number])    Question Answer Comment   In the event of cardiac or respiratory ARREST Do not call a "code blue"    In the event of cardiac or respiratory ARREST Do not perform Intubation, CPR, defibrillation or ACLS    In the event of cardiac or respiratory ARREST Use medication by any route, position, wound care, and other measures to relive pain and suffering. May use oxygen, suction and manual treatment of airway obstruction as needed for comfort.         Advance Directive Documentation     Most Recent Value  Type of Advance Directive  Healthcare Power of Attorney, Living will  Pre-existing out of facility DNR order (yellow form or pink MOST form)  -  "MOST" Form in Place?  -      TOTAL TIME TAKING CARE OF THIS PATIENT: 35 minutes.    Loletha Grayer M.D on 04/02/2018 at 3:13 PM  Between 7am to 6pm - Pager - [phone]  After 6pm go to [email or web address] - password [email or web address]  [facility] Office  [phone]  CC: Primary care physician; Tonia Ghent, MD

## 2018-04-03 ENCOUNTER — Telehealth: Payer: Self-pay

## 2018-04-03 NOTE — Telephone Encounter (Signed)
Noted. Thanks. Will see at OV.  

## 2018-04-03 NOTE — Telephone Encounter (Signed)
Transition Care Management Follow-up Telephone Call   Date discharged? 04/02/18   How have you been since you were released from the hospital? Feeling pretty good right now. His daughter is with him right now until tomorrow.   Do you understand why you were in the hospital? yes   Do you understand the discharge instructions? yes  Where were you discharged to? Home with his daughter right now.  Items Reviewed:  Medications reviewed: yes  Allergies reviewed: yes  Dietary changes reviewed: yes  Referrals reviewed: yes-has appointment with cardiologist to follow up   Functional Questionnaire:   Activities of Daily Living (ADLs):   He states they are independent in the following: ambulation, feeding, grooming, bathing, dressing, toileting. States they require assistance with the following: none   Any transportation issues/concerns?: no   Any patient concerns? Not at this time   Confirmed importance and date/time of follow-up visits scheduled yes  Provider Appointment booked with Dr. Damita Dunnings on 04/07/2018.  Confirmed with patient if condition begins to worsen call PCP or go to the ER.  Patient was given the office number and encouraged to call back with question or concerns.  : yes.

## 2018-04-07 ENCOUNTER — Ambulatory Visit (INDEPENDENT_AMBULATORY_CARE_PROVIDER_SITE_OTHER): Payer: Medicare Other | Admitting: Family Medicine

## 2018-04-07 ENCOUNTER — Encounter: Payer: Self-pay | Admitting: Family Medicine

## 2018-04-07 VITALS — BP 132/74 | HR 71 | Temp 97.9°F | Ht 64.0 in | Wt 186.4 lb

## 2018-04-07 DIAGNOSIS — I214 Non-ST elevation (NSTEMI) myocardial infarction: Secondary | ICD-10-CM | POA: Diagnosis not present

## 2018-04-07 MED ORDER — METOPROLOL SUCCINATE ER 25 MG PO TB24: 25.0000 mg | ORAL_TABLET | Freq: Every day | ORAL | 3 refills | Status: DC

## 2018-04-07 NOTE — Progress Notes (Signed)
DATE OF ADMISSION:  03/31/2018    ADMITTING PHYSICIAN: Sedalia Muta, MD  DATE OF DISCHARGE: 04/02/2018 10:34 AM  PRIMARY CARE PHYSICIAN: Tonia Ghent, MD    ADMISSION DIAGNOSIS:  NSTEMI (non-ST elevated myocardial infarction) ([place]) [I21.4]  DISCHARGE DIAGNOSIS:  Active Problems:   NSTEMI (non-ST elevated myocardial infarction) ([place])   SECONDARY DIAGNOSIS:       Past Medical History:  Diagnosis Date  . Allergic rhinitis   . Benign prostatic hypertrophy   . Coronary artery disease CARDIOLOGIST -- DR Daneen Schick  . Duodenal diverticulum 2003  . GERD (gastroesophageal reflux disease)   . H/O hiatal hernia 2003  . Heart murmur   . Hepatic lesion 2004  . History of melanoma excision    FACE  . Hypercholesteremia   . Hypertension   . Left carotid stenosis    > 50%  PER DUPLEX  03-28-2011  . Mild mitral regurgitation   . Perianal fistula   . S/P CABG x 4    1988  . S/P drug eluting coronary stent placement    POST CABG--  STENTING 2000  AND RESTENTING 2006 FOR REINSTENT STENOSIS  . Stroke [facility])     HOSPITAL COURSE:   1.  NSTEMI with history of CAD.  Cardiac catheterization showed triple-vessel disease.  Troponin peaked and starting to come down.  Cardiology recommended medical management with aspirin, Plavix, atorvastatin increased dose, Imdur and metoprolol.  Cardiac rehab set up 2.  History of CVA on aspirin Plavix and atorvastatin 3.  History of peripheral vascular disease on aspirin Plavix and statin 4.  Essential hypertension.  Atenolol switched over to metoprolol 5.  GERD on PPI  DISCHARGE CONDITIONS:   Satisfactory  =============================== Inpatient course discussed with patient.  He was at his baseline level of health until he started having chest pain.  He was transported to the hospital via EMS.  Diagnosed with NSTEMI.  Cardiac catheterization results discussed with patient.  Troponin trend discussed.  Pathophysiology  of his situation discussed.  Rationale for treatment discussed.  Chest pain resolved and he was able to be discharged.  His atenolol was changed to metoprolol.  He was continued on aspirin Plavix and statin for his history of peripheral vascular disease and hypertension and history of CVA.  He still on baseline treatment for GERD.  In the meantime no CP, no SOB, but mild BLE edema that predates the recent events.  He wanted to know about when to go back to exercise.  He has follow-up with cardiology pending.  PMH and SH reviewed  ROS: Per HPI unless specifically indicated in ROS section   Meds, vitals, and allergies reviewed.   GEN: nad, alert and oriented HEENT: mucous membranes moist NECK: supple w/o LA CV: rrr. PULM: ctab, no inc wob ABD: soft, +bs EXT: Trace BLE edema SKIN: no acute rash

## 2018-04-07 NOTE — Patient Instructions (Addendum)
Don't change your meds for now.  I'll update cardiology.  Hold off on exercise until you have seen cardiology.  They'll likely set up cardiac rehab.  If you have any chest pain or shortness of breath then diall 911 or go to the ER. Take care.  Glad to see you.  Let me know if you need a refill on plavix.

## 2018-04-08 NOTE — Assessment & Plan Note (Signed)
No change in meds at this point.  Rationale for treatment discussed with patient. I didn't increase lipitor to 80mg  a day.  I'll defer to cards.  He has cardiology follow-up pending.  I asked him to hold off on exercise until he has seen cardiology.  They'll likely set up cardiac rehab.  If any chest pain or shortness of breath then diall 911 or go to the ER.  He will let me know if he needs a refill on plavix.    Continue PPI, statin, aspirin, Plavix in the meantime as above.

## 2018-04-13 ENCOUNTER — Encounter: Payer: Self-pay | Admitting: *Deleted

## 2018-04-13 ENCOUNTER — Encounter: Payer: Medicare Other | Attending: Internal Medicine | Admitting: *Deleted

## 2018-04-13 VITALS — Ht 65.5 in | Wt 181.9 lb

## 2018-04-13 DIAGNOSIS — H00019 Hordeolum externum unspecified eye, unspecified eyelid: Secondary | ICD-10-CM | POA: Diagnosis not present

## 2018-04-13 DIAGNOSIS — I25810 Atherosclerosis of coronary artery bypass graft(s) without angina pectoris: Secondary | ICD-10-CM | POA: Insufficient documentation

## 2018-04-13 DIAGNOSIS — I214 Non-ST elevation (NSTEMI) myocardial infarction: Secondary | ICD-10-CM

## 2018-04-13 DIAGNOSIS — R413 Other amnesia: Secondary | ICD-10-CM | POA: Insufficient documentation

## 2018-04-13 NOTE — Patient Instructions (Signed)
Patient Instructions  Patient Details  Name: Jason Velazquez MRN: [id number] Date of Birth: 05-Jan-1929 Referring Provider:  Nelva Bush, MD  Below are your personal goals for exercise, nutrition, and risk factors. Our goal is to help you stay on track towards obtaining and maintaining these goals. We will be discussing your progress on these goals with you throughout the program.  Initial Exercise Prescription: Initial Exercise Prescription - 04/13/18 1500      Date of Initial Exercise RX and Referring Provider   Date  04/13/18    Referring Provider  End, Christopher MD      Treadmill   MPH  1.4    Grade  0.5    Minutes  15    METs  2.17      NuStep   Level  1    SPM  80    Minutes  15    METs  2.1      Biostep-RELP   Level  1    SPM  50    Minutes  15    METs  2      Prescription Details   Frequency (times per week)  3    Duration  Progress to 45 minutes of aerobic exercise without signs/symptoms of physical distress      Intensity   THRR 40-80% of Max Heartrate  93-120    Ratings of Perceived Exertion  11-13    Perceived Dyspnea  0-4      Progression   Progression  Continue to progress workloads to maintain intensity without signs/symptoms of physical distress.      Resistance Training   Training Prescription  Yes    Weight  3 lbs    Reps  10-15       Exercise Goals: Frequency: Be able to perform aerobic exercise two to three times per week in program working toward 2-5 days per week of home exercise.  Intensity: Work with a perceived exertion of 11 (fairly light) - 15 (hard) while following your exercise prescription.  We will make changes to your prescription with you as you progress through the program.   Duration: Be able to do 30 to 45 minutes of continuous aerobic exercise in addition to a 5 minute warm-up and a 5 minute cool-down routine.   Nutrition Goals: Your personal nutrition goals will be established when you do your nutrition analysis  with the dietician.  The following are general nutrition guidelines to follow: Cholesterol < 200mg /day Sodium < 1500mg /day Fiber: Men over 50 yrs - 30 grams per day  Personal Goals: Personal Goals and Risk Factors at Admission - 04/13/18 1304      Core Components/Risk Factors/Patient Goals on Admission    Weight Management  Yes;Weight Loss    Intervention  Weight Management: Develop a combined nutrition and exercise program designed to reach desired caloric intake, while maintaining appropriate intake of nutrient and fiber, sodium and fats, and appropriate energy expenditure required for the weight goal.;Weight Management/Obesity: Establish reasonable short term and long term weight goals.    Admit Weight  181 lb 14.4 oz (82.5 kg)    Goal Weight: Short Term  179 lb (81.2 kg)    Goal Weight: Long Term  160 lb (72.6 kg)    Expected Outcomes  Short Term: Continue to assess and modify interventions until short term weight is achieved;Long Term: Adherence to nutrition and physical activity/exercise program aimed toward attainment of established weight goal;Weight Loss: Understanding of general recommendations for a balanced deficit  meal plan, which promotes 1-2 lb weight loss per week and includes a negative energy balance of [id number] kcal/d    Hypertension  Yes    Intervention  Provide education on lifestyle modifcations including regular physical activity/exercise, weight management, moderate sodium restriction and increased consumption of fresh fruit, vegetables, and low fat dairy, alcohol moderation, and smoking cessation.;Monitor prescription use compliance.    Expected Outcomes  Short Term: Continued assessment and intervention until BP is < 140/62mm HG in hypertensive participants. < 130/52mm HG in hypertensive participants with diabetes, heart failure or chronic kidney disease.;Long Term: Maintenance of blood pressure at goal levels.    Lipids  Yes    Intervention  Provide education and  support for participant on nutrition & aerobic/resistive exercise along with prescribed medications to achieve LDL 70mg , HDL >40mg .    Expected Outcomes  Short Term: Participant states understanding of desired cholesterol values and is compliant with medications prescribed. Participant is following exercise prescription and nutrition guidelines.;Long Term: Cholesterol controlled with medications as prescribed, with individualized exercise RX and with personalized nutrition plan. Value goals: LDL < 70mg , HDL > 40 mg.       Tobacco Use Initial Evaluation: Social History   Tobacco Use  Smoking Status Never Smoker  Smokeless Tobacco Never Used    Exercise Goals and Review: Exercise Goals    Row Name 04/13/18 1506             Exercise Goals   Increase Physical Activity  Yes       Intervention  Provide advice, education, support and counseling about physical activity/exercise needs.;Develop an individualized exercise prescription for aerobic and resistive training based on initial evaluation findings, risk stratification, comorbidities and participant's personal goals.       Expected Outcomes  Short Term: Attend rehab on a regular basis to increase amount of physical activity.;Long Term: Add in home exercise to make exercise part of routine and to increase amount of physical activity.;Long Term: Exercising regularly at least 3-5 days a week.       Increase Strength and Stamina  Yes       Intervention  Provide advice, education, support and counseling about physical activity/exercise needs.;Develop an individualized exercise prescription for aerobic and resistive training based on initial evaluation findings, risk stratification, comorbidities and participant's personal goals.       Expected Outcomes  Short Term: Increase workloads from initial exercise prescription for resistance, speed, and METs.;Short Term: Perform resistance training exercises routinely during rehab and add in resistance  training at home;Long Term: Improve cardiorespiratory fitness, muscular endurance and strength as measured by increased METs and functional capacity (6MWT)       Able to understand and use rate of perceived exertion (RPE) scale  Yes       Intervention  Provide education and explanation on how to use RPE scale       Expected Outcomes  Short Term: Able to use RPE daily in rehab to express subjective intensity level;Long Term:  Able to use RPE to guide intensity level when exercising independently       Knowledge and understanding of Target Heart Rate Range (THRR)  Yes       Intervention  Provide education and explanation of THRR including how the numbers were predicted and where they are located for reference       Expected Outcomes  Short Term: Able to state/look up THRR;Short Term: Able to use daily as guideline for intensity in rehab;Long Term: Able to use THRR  to govern intensity when exercising independently       Able to check pulse independently  Yes       Intervention  Provide education and demonstration on how to check pulse in carotid and radial arteries.;Review the importance of being able to check your own pulse for safety during independent exercise       Expected Outcomes  Short Term: Able to explain why pulse checking is important during independent exercise;Long Term: Able to check pulse independently and accurately       Understanding of Exercise Prescription  Yes       Intervention  Provide education, explanation, and written materials on patient's individual exercise prescription       Expected Outcomes  Short Term: Able to explain program exercise prescription;Long Term: Able to explain home exercise prescription to exercise independently          Copy of goals given to participant.

## 2018-04-13 NOTE — Progress Notes (Signed)
Daily Session Note  Patient Details  Name: Jason Velazquez MRN: [id number]  Date of Birth: Dec 21, 1928 Referring Provider:     Cardiac Rehab from 04/13/2018 in [facility]  Referring Provider  End, Harrell Gave MD      Encounter Date: 04/13/2018  Check In: Session Check In - 04/13/18 1255      Check-In   Supervising physician immediately available to respond to emergencies  See telemetry face sheet for immediately available ER MD    Location  [facility]    Staff Present  Alberteen Sam, MA, RCEP, CCRP, Exercise Physiologist;Meredith Sherryll Burger, RN BSN;Susanne Bice, RN, BSN, CCRP    Warm-up and Cool-down  Not performed (comment)    Resistance Training Performed  (S) No   Medical evaluation completed today   VAD Patient?  No    PAD/SET Patient?  No      Pain Assessment   Currently in Pain?  No/denies        Exercise Prescription Changes - 04/13/18 1200      Response to Exercise   Blood Pressure (Admit)  132/70    Blood Pressure (Exercise)  158/64    Blood Pressure (Exit)  130/70    Heart Rate (Admit)  67 bpm    Heart Rate (Exercise)  111 bpm    Heart Rate (Exit)  70 bpm    Oxygen Saturation (Admit)  99 %    Oxygen Saturation (Exercise)  99 %    Rating of Perceived Exertion (Exercise)  13    Symptoms  none    Comments  walk test results       Social History   Tobacco Use  Smoking Status Never Smoker  Smokeless Tobacco Never Used    Goals Met:  Exercise tolerated well Personal goals reviewed No report of cardiac concerns or symptoms Strength training completed today  Goals Unmet:  Not Applicable  Comments: Medical evaluation and 6MWT completed today    Dr. Emily Filbert is Medical Director for [place] and [facility].

## 2018-04-13 NOTE — Progress Notes (Signed)
Cardiac Individual Treatment Plan  Patient Details  Name: Jason Velazquez MRN: [id number] Date of Birth: [birth year] Referring Provider:     Cardiac Rehab from 04/13/2018 in [facility] Cardiac and Pulmonary Rehab  Referring Provider  End, Harrell Gave MD      Initial Encounter Date:    Cardiac Rehab from 04/13/2018 in [facility] Cardiac and Pulmonary Rehab  Date  04/13/18      Visit Diagnosis: NSTEMI (non-ST elevated myocardial infarction) [facility])  Patient's Home Medications on Admission:  Current Outpatient Medications:  .  aspirin EC 81 MG EC tablet, Take 1 tablet (81 mg total) by mouth daily., Disp: 30 tablet, Rfl: 0 .  atorvastatin (LIPITOR) 40 MG tablet, Take 1 tablet (40 mg total) by mouth at bedtime., Disp: 30 tablet, Rfl: 0 .  clopidogrel (PLAVIX) 75 MG tablet, Take 75 mg by mouth daily., Disp: , Rfl:  .  docusate sodium (COLACE) 100 MG capsule, Take 200 mg by mouth 2 (two) times daily. , Disp: , Rfl:  .  isosorbide mononitrate (IMDUR) 30 MG 24 hr tablet, Take 0.5 tablets (15 mg total) by mouth daily., Disp: 15 tablet, Rfl: 0 .  ketoconazole (NIZORAL) 2 % cream, Apply 1 application topically 2 (two) times daily., Disp: 60 g, Rfl: 1 .  metoprolol succinate (TOPROL-XL) 25 MG 24 hr tablet, Take 1 tablet (25 mg total) by mouth daily. Take with or immediately following a meal., Disp: 90 tablet, Rfl: 3 .  nitroGLYCERIN (NITROSTAT) 0.4 MG SL tablet, Place 1 tablet (0.4 mg total) under the tongue every 5 (five) minutes as needed for chest pain., Disp: 30 tablet, Rfl: 0 .  pantoprazole (PROTONIX) 40 MG tablet, Take 1 tablet (40 mg total) by mouth daily., Disp: 90 tablet, Rfl: 3 .  polyethylene glycol (MIRALAX / GLYCOLAX) packet, Take 17 g by mouth daily., Disp: , Rfl:  .  cyanocobalamin (,VITAMIN B-12,) 1000 MCG/ML injection, INJECT 1ML IM MONTHLY (Patient not taking: Reported on 04/13/2018), Disp: 12 mL, Rfl: 1  Past Medical History: Past Medical History:  Diagnosis Date  . Allergic rhinitis   . Benign  prostatic hypertrophy   . Coronary artery disease CARDIOLOGIST -- DR Daneen Schick  . Duodenal diverticulum 2003  . GERD (gastroesophageal reflux disease)   . H/O hiatal hernia 2003  . Heart murmur   . Hepatic lesion 2004  . History of melanoma excision    FACE  . Hypercholesteremia   . Hypertension   . Left carotid stenosis    > 50%  PER DUPLEX  03-28-2011  . Mild mitral regurgitation   . Perianal fistula   . S/P CABG x 4    1988  . S/P drug eluting coronary stent placement    POST CABG--  STENTING 2000  AND RESTENTING 2006 FOR REINSTENT STENOSIS  . Stroke [facility])     Tobacco Use: Social History   Tobacco Use  Smoking Status Never Smoker  Smokeless Tobacco Never Used    Labs: Recent Review Flowsheet Data    Labs for ITP Cardiac and Pulmonary Rehab Latest Ref Rng & Units 05/27/2013 03/15/2016 02/18/2017 02/19/2018 03/31/2018   Cholestrol 0 - 200 mg/dL 126 142 150 128 137   LDLCALC 0 - 99 mg/dL 69 79 75 65 82   HDL >40 mg/dL 40 42.60 41.50 35.30(L) 42   Trlycerides <150 mg/dL 85 103.0 170.0(H) 136.0 64   Hemoglobin A1c <5.7 % 5.8(H) - - - -       Exercise Target Goals: Exercise Program Goal: Individual exercise  prescription set using results from initial 6 min walk test and THRR while considering  patient's activity barriers and safety.   Exercise Prescription Goal: Initial exercise prescription builds to 30-45 minutes a day of aerobic activity, 2-3 days per week.  Home exercise guidelines will be given to patient during program as part of exercise prescription that the participant will acknowledge.  Activity Barriers & Risk Stratification: Activity Barriers & Cardiac Risk Stratification - 04/13/18 1259      Activity Barriers & Cardiac Risk Stratification   Activity Barriers  Deconditioning;Muscular Weakness;Balance Concerns    Cardiac Risk Stratification  High       6 Minute Walk: 6 Minute Walk    Row Name 04/13/18 1504         6 Minute Walk   Phase  Initial      Distance  1268 feet     Walk Time  6 minutes     # of Rest Breaks  0     MPH  2.4     METS  2.09     RPE  13     VO2 Peak  7.31     Symptoms  No     Resting HR  67 bpm     Resting BP  132/70     Resting Oxygen Saturation   99 %     Exercise Oxygen Saturation  during 6 min walk  99 %     Max Ex. HR  111 bpm     Max Ex. BP  158/64     2 Minute Post BP  130/70        Oxygen Initial Assessment:   Oxygen Re-Evaluation:   Oxygen Discharge (Final Oxygen Re-Evaluation):   Initial Exercise Prescription: Initial Exercise Prescription - 04/13/18 1500      Date of Initial Exercise RX and Referring Provider   Date  04/13/18    Referring Provider  End, Christopher MD      Treadmill   MPH  1.4    Grade  0.5    Minutes  15    METs  2.17      NuStep   Level  1    SPM  80    Minutes  15    METs  2.1      Biostep-RELP   Level  1    SPM  50    Minutes  15    METs  2      Prescription Details   Frequency (times per week)  3    Duration  Progress to 45 minutes of aerobic exercise without signs/symptoms of physical distress      Intensity   THRR 40-80% of Max Heartrate  93-120    Ratings of Perceived Exertion  11-13    Perceived Dyspnea  0-4      Progression   Progression  Continue to progress workloads to maintain intensity without signs/symptoms of physical distress.      Resistance Training   Training Prescription  Yes    Weight  3 lbs    Reps  10-15       Perform Capillary Blood Glucose checks as needed.  Exercise Prescription Changes: Exercise Prescription Changes    Row Name 04/13/18 1200             Response to Exercise   Blood Pressure (Admit)  132/70       Blood Pressure (Exercise)  158/64       Blood Pressure (  Exit)  130/70       Heart Rate (Admit)  67 bpm       Heart Rate (Exercise)  111 bpm       Heart Rate (Exit)  70 bpm       Oxygen Saturation (Admit)  99 %       Oxygen Saturation (Exercise)  99 %       Rating of Perceived Exertion  (Exercise)  13       Symptoms  none       Comments  walk test results          Exercise Comments:   Exercise Goals and Review: Exercise Goals    Row Name 04/13/18 1506             Exercise Goals   Increase Physical Activity  Yes       Intervention  Provide advice, education, support and counseling about physical activity/exercise needs.;Develop an individualized exercise prescription for aerobic and resistive training based on initial evaluation findings, risk stratification, comorbidities and participant's personal goals.       Expected Outcomes  Short Term: Attend rehab on a regular basis to increase amount of physical activity.;Long Term: Add in home exercise to make exercise part of routine and to increase amount of physical activity.;Long Term: Exercising regularly at least 3-5 days a week.       Increase Strength and Stamina  Yes       Intervention  Provide advice, education, support and counseling about physical activity/exercise needs.;Develop an individualized exercise prescription for aerobic and resistive training based on initial evaluation findings, risk stratification, comorbidities and participant's personal goals.       Expected Outcomes  Short Term: Increase workloads from initial exercise prescription for resistance, speed, and METs.;Short Term: Perform resistance training exercises routinely during rehab and add in resistance training at home;Long Term: Improve cardiorespiratory fitness, muscular endurance and strength as measured by increased METs and functional capacity (6MWT)       Able to understand and use rate of perceived exertion (RPE) scale  Yes       Intervention  Provide education and explanation on how to use RPE scale       Expected Outcomes  Short Term: Able to use RPE daily in rehab to express subjective intensity level;Long Term:  Able to use RPE to guide intensity level when exercising independently       Knowledge and understanding of Target Heart Rate  Range (THRR)  Yes       Intervention  Provide education and explanation of THRR including how the numbers were predicted and where they are located for reference       Expected Outcomes  Short Term: Able to state/look up THRR;Short Term: Able to use daily as guideline for intensity in rehab;Long Term: Able to use THRR to govern intensity when exercising independently       Able to check pulse independently  Yes       Intervention  Provide education and demonstration on how to check pulse in carotid and radial arteries.;Review the importance of being able to check your own pulse for safety during independent exercise       Expected Outcomes  Short Term: Able to explain why pulse checking is important during independent exercise;Long Term: Able to check pulse independently and accurately       Understanding of Exercise Prescription  Yes       Intervention  Provide education, explanation, and written materials  on patient's individual exercise prescription       Expected Outcomes  Short Term: Able to explain program exercise prescription;Long Term: Able to explain home exercise prescription to exercise independently          Exercise Goals Re-Evaluation :   Discharge Exercise Prescription (Final Exercise Prescription Changes): Exercise Prescription Changes - 04/13/18 1200      Response to Exercise   Blood Pressure (Admit)  132/70    Blood Pressure (Exercise)  158/64    Blood Pressure (Exit)  130/70    Heart Rate (Admit)  67 bpm    Heart Rate (Exercise)  111 bpm    Heart Rate (Exit)  70 bpm    Oxygen Saturation (Admit)  99 %    Oxygen Saturation (Exercise)  99 %    Rating of Perceived Exertion (Exercise)  13    Symptoms  none    Comments  walk test results       Nutrition:  Target Goals: Understanding of nutrition guidelines, daily intake of sodium <1549m, cholesterol <2074m calories 30% from fat and 7% or less from saturated fats, daily to have 5 or more servings of fruits and  vegetables.  Biometrics: Pre Biometrics - 04/13/18 1507      Pre Biometrics   Height  5' 5.5" (1.664 m)    Weight  181 lb 14.4 oz (82.5 kg)    Waist Circumference  41 inches    Hip Circumference  42.5 inches    Waist to Hip Ratio  0.96 %    BMI (Calculated)  29.8    Single Leg Stand  6.85 seconds        Nutrition Therapy Plan and Nutrition Goals: Nutrition Therapy & Goals - 04/13/18 1302      Intervention Plan   Intervention  Prescribe, educate and counsel regarding individualized specific dietary modifications aiming towards targeted core components such as weight, hypertension, lipid management, diabetes, heart failure and other comorbidities.    Expected Outcomes  Short Term Goal: Understand basic principles of dietary content, such as calories, fat, sodium, cholesterol and nutrients.;Short Term Goal: A plan has been developed with personal nutrition goals set during dietitian appointment.;Long Term Goal: Adherence to prescribed nutrition plan.       Nutrition Assessments: Nutrition Assessments - 04/13/18 1302      MEDFICTS Scores   Pre Score  52       Nutrition Goals Re-Evaluation:   Nutrition Goals Discharge (Final Nutrition Goals Re-Evaluation):   Psychosocial: Target Goals: Acknowledge presence or absence of significant depression and/or stress, maximize coping skills, provide positive support system. Participant is able to verbalize types and ability to use techniques and skills needed for reducing stress and depression.   Initial Review & Psychosocial Screening: Initial Psych Review & Screening - 04/13/18 1300      Initial Review   Current issues with  None Identified      Family Dynamics   Good Support System?  Yes   Daughter BeJacqlyn Larsennd Son StRichardson Landry   Barriers   Psychosocial barriers to participate in program  There are no identifiable barriers or psychosocial needs.      Screening Interventions   Interventions  Encouraged to exercise;To provide  support and resources with identified psychosocial needs;Provide feedback about the scores to participant    Expected Outcomes  Short Term goal: Utilizing psychosocial counselor, staff and physician to assist with identification of specific Stressors or current issues interfering with healing process. Setting desired goal for  each stressor or current issue identified.;Long Term Goal: Stressors or current issues are controlled or eliminated.;Short Term goal: Identification and review with participant of any Quality of Life or Depression concerns found by scoring the questionnaire.;Long Term goal: The participant improves quality of Life and PHQ9 Scores as seen by post scores and/or verbalization of changes       Quality of Life Scores:  Quality of Life - 04/13/18 1301      Quality of Life   Select  Quality of Life      Quality of Life Scores   Health/Function Pre  27.29 %    Socioeconomic Pre  24 %    Psych/Spiritual Pre  29.29 %    Family Pre  30 %    GLOBAL Pre  27.59 %      Scores of 19 and below usually indicate a poorer quality of life in these areas.  A difference of  2-3 points is a clinically meaningful difference.  A difference of 2-3 points in the total score of the Quality of Life Index has been associated with significant improvement in overall quality of life, self-image, physical symptoms, and general health in studies assessing change in quality of life.  PHQ-9: Recent Review Flowsheet Data    Depression screen [facility] 2/9 04/13/2018 02/19/2018 02/18/2017 11/02/2015   Decreased Interest 0 0 0 0   Down, Depressed, Hopeless 0 0 0 1   PHQ - 2 Score 0 0 0 1   Altered sleeping 0 0 0 -   Tired, decreased energy 0 0 0 -   Change in appetite 0 0 0 -   Feeling bad or failure about yourself  0 0 0 -   Trouble concentrating 0 0 0 -   Moving slowly or fidgety/restless 0 0 0 -   Suicidal thoughts 0 0 0 -   PHQ-9 Score 0 0 0 -   Difficult doing work/chores Not difficult at all Not difficult  at all Not difficult at all -     Interpretation of Total Score  Total Score Depression Severity:  1-4 = Minimal depression, 5-9 = Mild depression, 10-14 = Moderate depression, 15-19 = Moderately severe depression, 20-27 = Severe depression   Psychosocial Evaluation and Intervention:   Psychosocial Re-Evaluation:   Psychosocial Discharge (Final Psychosocial Re-Evaluation):   Vocational Rehabilitation: Provide vocational rehab assistance to qualifying candidates.   Vocational Rehab Evaluation & Intervention: Vocational Rehab - 04/13/18 1304      Initial Vocational Rehab Evaluation & Intervention   Assessment shows need for Vocational Rehabilitation  No       Education: Education Goals: Education classes will be provided on a variety of topics geared toward better understanding of heart health and risk factor modification. Participant will state understanding/return demonstration of topics presented as noted by education test scores.  Learning Barriers/Preferences: Learning Barriers/Preferences - 04/13/18 1303      Learning Barriers/Preferences   Learning Barriers  Hearing   hearing aids   Learning Preferences  Individual Instruction;Verbal Instruction       Education Topics:  AED/CPR: - Group verbal and written instruction with the use of models to demonstrate the basic use of the AED with the basic ABC's of resuscitation.   General Nutrition Guidelines/Fats and Fiber: -Group instruction provided by verbal, written material, models and posters to present the general guidelines for heart healthy nutrition. Gives an explanation and review of dietary fats and fiber.   Controlling Sodium/Reading Food Labels: -Group verbal and written material  supporting the discussion of sodium use in heart healthy nutrition. Review and explanation with models, verbal and written materials for utilization of the food label.   Exercise Physiology & General Exercise Guidelines: - Group  verbal and written instruction with models to review the exercise physiology of the cardiovascular system and associated critical values. Provides general exercise guidelines with specific guidelines to those with heart or lung disease.    Aerobic Exercise & Resistance Training: - Gives group verbal and written instruction on the various components of exercise. Focuses on aerobic and resistive training programs and the benefits of this training and how to safely progress through these programs..   Flexibility, Balance, Mind/Body Relaxation: Provides group verbal/written instruction on the benefits of flexibility and balance training, including mind/body exercise modes such as yoga, pilates and tai chi.  Demonstration and skill practice provided.   Stress and Anxiety: - Provides group verbal and written instruction about the health risks of elevated stress and causes of high stress.  Discuss the correlation between heart/lung disease and anxiety and treatment options. Review healthy ways to manage with stress and anxiety.   Depression: - Provides group verbal and written instruction on the correlation between heart/lung disease and depressed mood, treatment options, and the stigmas associated with seeking treatment.   Anatomy & Physiology of the Heart: - Group verbal and written instruction and models provide basic cardiac anatomy and physiology, with the coronary electrical and arterial systems. Review of Valvular disease and Heart Failure   Cardiac Procedures: - Group verbal and written instruction to review commonly prescribed medications for heart disease. Reviews the medication, class of the drug, and side effects. Includes the steps to properly store meds and maintain the prescription regimen. (beta blockers and nitrates)   Cardiac Medications I: - Group verbal and written instruction to review commonly prescribed medications for heart disease. Reviews the medication, class of the  drug, and side effects. Includes the steps to properly store meds and maintain the prescription regimen.   Cardiac Medications II: -Group verbal and written instruction to review commonly prescribed medications for heart disease. Reviews the medication, class of the drug, and side effects. (all other drug classes)    Go Sex-Intimacy & Heart Disease, Get SMART - Goal Setting: - Group verbal and written instruction through game format to discuss heart disease and the return to sexual intimacy. Provides group verbal and written material to discuss and apply goal setting through the application of the S.M.A.R.T. Method.   Other Matters of the Heart: - Provides group verbal, written materials and models to describe Stable Angina and Peripheral Artery. Includes description of the disease process and treatment options available to the cardiac patient.   Exercise & Equipment Safety: - Individual verbal instruction and demonstration of equipment use and safety with use of the equipment.   Cardiac Rehab from 04/13/2018 in [facility] Cardiac and Pulmonary Rehab  Date  04/13/18  Educator  mc  Instruction Review Code  1- Verbalizes Understanding      Infection Prevention: - Provides verbal and written material to individual with discussion of infection control including proper hand washing and proper equipment cleaning during exercise session.   Cardiac Rehab from 04/13/2018 in [facility] Cardiac and Pulmonary Rehab  Date  04/13/18  Educator  mc  Instruction Review Code  1- Verbalizes Understanding      Falls Prevention: - Provides verbal and written material to individual with discussion of falls prevention and safety.   Cardiac Rehab from 04/13/2018 in [facility] Cardiac  and Pulmonary Rehab  Date  04/13/18  Educator  mc  Instruction Review Code  1- Verbalizes Understanding      Diabetes: - Individual verbal and written instruction to review signs/symptoms of diabetes, desired ranges of glucose level  fasting, after meals and with exercise. Acknowledge that pre and post exercise glucose checks will be done for 3 sessions at entry of program.   Know Your Numbers and Risk Factors: -Group verbal and written instruction about important numbers in your health.  Discussion of what are risk factors and how they play a role in the disease process.  Review of Cholesterol, Blood Pressure, Diabetes, and BMI and the role they play in your overall health.   Sleep Hygiene: -Provides group verbal and written instruction about how sleep can affect your health.  Define sleep hygiene, discuss sleep cycles and impact of sleep habits. Review good sleep hygiene tips.    Other: -Provides group and verbal instruction on various topics (see comments)   Knowledge Questionnaire Score: Knowledge Questionnaire Score - 04/13/18 1304      Knowledge Questionnaire Score   Pre Score  25/26   Correct response was reviewed with Elenore Rota today. He verbalized understanding of response and had no further questions today.      Core Components/Risk Factors/Patient Goals at Admission: Personal Goals and Risk Factors at Admission - 04/13/18 1304      Core Components/Risk Factors/Patient Goals on Admission    Weight Management  Yes;Weight Loss    Intervention  Weight Management: Develop a combined nutrition and exercise program designed to reach desired caloric intake, while maintaining appropriate intake of nutrient and fiber, sodium and fats, and appropriate energy expenditure required for the weight goal.;Weight Management/Obesity: Establish reasonable short term and long term weight goals.    Admit Weight  181 lb 14.4 oz (82.5 kg)    Goal Weight: Short Term  179 lb (81.2 kg)    Goal Weight: Long Term  160 lb (72.6 kg)    Expected Outcomes  Short Term: Continue to assess and modify interventions until short term weight is achieved;Long Term: Adherence to nutrition and physical activity/exercise program aimed toward  attainment of established weight goal;Weight Loss: Understanding of general recommendations for a balanced deficit meal plan, which promotes 1-2 lb weight loss per week and includes a negative energy balance of [phone] kcal/d    Hypertension  Yes    Intervention  Provide education on lifestyle modifcations including regular physical activity/exercise, weight management, moderate sodium restriction and increased consumption of fresh fruit, vegetables, and low fat dairy, alcohol moderation, and smoking cessation.;Monitor prescription use compliance.    Expected Outcomes  Short Term: Continued assessment and intervention until BP is < 140/45m HG in hypertensive participants. < 130/876mHG in hypertensive participants with diabetes, heart failure or chronic kidney disease.;Long Term: Maintenance of blood pressure at goal levels.    Lipids  Yes    Intervention  Provide education and support for participant on nutrition & aerobic/resistive exercise along with prescribed medications to achieve LDL <7034mHDL >87m48m  Expected Outcomes  Short Term: Participant states understanding of desired cholesterol values and is compliant with medications prescribed. Participant is following exercise prescription and nutrition guidelines.;Long Term: Cholesterol controlled with medications as prescribed, with individualized exercise RX and with personalized nutrition plan. Value goals: LDL < 70mg27mL > 40 mg.       Core Components/Risk Factors/Patient Goals Review:    Core Components/Risk Factors/Patient Goals at Discharge (Final Review):  ITP Comments: ITP Comments    Row Name 04/13/18 1254           ITP Comments  Medical review completed today. ITP sent to Dr Loleta Chance for review, changes as needed and signature.  Documentation of diagnosis can be found in [facility] 03/31/2018          Comments: Initial ITP

## 2018-04-14 NOTE — Progress Notes (Signed)
Cardiology Office Note Date:  04/17/2018  Patient ID:  Jason Velazquez, DOB [birth year], MRN [id number] PCP:  Tonia Ghent, MD  Cardiologist:  Formerly Dr. Yvone Neu, consult by Dr. Saunders Revel    Chief Complaint: Hospital follow up  History of Present Illness: Jason Velazquez is a 85 y.o. male with history of CAD s/p 4-vessel CABG in 1988 with LIMA to LAD, sequential SVG to D1 and D2, SVG to RPL with subsequent PCI/DES to the proximal LCx secondary to high-grade ISR of the prior BMS in 2006, ICM with overall normal LVEF, prior stroke with residual memory deficit, carotid artery stenosis, HTN, HLD, and BPH who presents for hospital follow up after recent admission to [facility] from 2/18-2/20 for NSTEMI.   Patient was previously followed by Dr. Yvone Neu, last being seen as an outpatient in 06/2016 for follow up. He was admitted to the hospital in 03/2018 with a NSTEMI with troponin peaking at 3.92. He underwent LHC on 03/31/2018 that showed severe 3-vessel CAD including CTO of the ostial and mid LAD, in-stent restenosis of overlapping proximal LCx stents complicated by stent fracture, sequential 80% OM2 stenoses, 80% jailed AV groove LCx stenosis, 70% proximal RCA lesion, sequential 60% and 70% ostial and distal rPDA stenoses, and CTO of the rPL. Widely patent LIMA to LAD and SVG to rPL. Occlusion of sequential SVG to diagnoal branches. The patient's culprit lesion was felt to be acute occlusion on chronic subtotal occlusion, given that collaterals were already noted. EF 45% with mid anterior wall hypokinesis, mildly reduced LV filling pressure. Medical therapy was advised given it was likely acute total occlusion superimposed on subtotal occlusion of SVG to diagonal branches in a graft that was more than 83 years old. Echo showed an EF of 34-19%, diastolic dysfunction, degenerative mitral valve, mild to moderate TR, mild anterolateral wall hypokinesis.    Discharge labs: WBC 8.1, HGB 12.6, PLT 174, K+ 4.3, SCr 0.92, LDL  82  He comes in accompanied by his daughter today and is doing well from a cardiac perspective.  There does appear to be some significant confusion versus dementia as the patient does not recall whether or not he has had further chest pain when asked at our visit.  His daughter stops him and reports the patient has told her he has had 4 episodes of brief chest pain that responded within several seconds and did not require sublingual nitroglycerin.  Patient's daughter reported the patient denied any associated symptoms with these episodes.  He has been compliant with all medications.  No falls, BRBPR, or melena.  He did participate with cardiac rehab this morning without issues.  No dizziness, presyncope, syncope, lower extremity swelling, orthopnea, PND, early satiety.  Past Medical History:  Diagnosis Date  . Allergic rhinitis   . Benign prostatic hypertrophy   . Coronary artery disease CARDIOLOGIST -- DR Daneen Schick  . Duodenal diverticulum 2003  . GERD (gastroesophageal reflux disease)   . H/O hiatal hernia 2003  . Heart murmur   . Hepatic lesion 2004  . History of melanoma excision    FACE  . Hypercholesteremia   . Hypertension   . Left carotid stenosis    > 50%  PER DUPLEX  03-28-2011  . Mild mitral regurgitation   . Perianal fistula   . S/P CABG x 4    1988  . S/P drug eluting coronary stent placement    POST CABG--  STENTING 2000  AND RESTENTING 2006 FOR REINSTENT STENOSIS  . Stroke [facility])  Past Surgical History:  Procedure Laterality Date  . CATARACT EXTRACTION W/ INTRAOCULAR LENS  IMPLANT, BILATERAL    . CORONARY ANGIOPLASTY WITH STENT PLACEMENT  2000   PCI AND STENTING CIRCUMFLEX  . CORONARY ANGIOPLASTY WITH STENT PLACEMENT  04-30-2004  DR Daneen Schick   RE-INSTENT STENOSIS/  DRUG-ELUTING STENT OF PROXIMAL AND MID CIRCUMFLEX/ PCI MID CIRCUMFLEX THROUGH STENT STRUT/  WIDELY PATENT SAPHENOUS VEIN GRAFT AND  LIMA TO LAD GRAFT/ TOTAL OCCLUSION RIGHT CORONARY BEYOND THE  POSTERIOR DESCENDING ARTERY BRANCH WITH 50% OSTIAL NARROWING/ TOTAL OCCLUSION OF LAD AT THE OSTIUM OF THE LEFT MAIN  . CORONARY ARTERY BYPASS GRAFT  1988   X5  . EVALUATION UNDER ANESTHESIA WITH ANAL FISTULECTOMY N/A 07/22/2012   Procedure: EXAM UNDER ANESTHESIA WITH ANAL fistulotomy;  Surgeon: Leighton Ruff, MD;  Location: [facility];  Service: General;  Laterality: N/A;  . LEFT HEART CATH AND CORS/GRAFTS ANGIOGRAPHY N/A 03/31/2018   Procedure: LEFT HEART CATH AND CORS/GRAFTS ANGIOGRAPHY;  Surgeon: Nelva Bush, MD;  Location: [facility];  Service: Cardiovascular;  Laterality: N/A;  . SHOULDER ARTHROSCOPY WITH OPEN ROTATOR CUFF REPAIR Right 07-10-1999  . TONSILLECTOMY AND ADENOIDECTOMY  1944  . TRANSTHORACIC ECHOCARDIOGRAM  03-01-2011  DR Daneen Schick   NORMAL LVF AND LV SIZE/ MILD LEFT ATRIAL ENLARGEMENT/ GRADE II DIASTOLIC DYSFUNCTION WITH ELEVATED LEFT ATRIAL PRESSURE/ MILD TO MODERATE MR/ MILD TR  . TRANSURETHRAL RESECTION OF PROSTATE  04/09/2011   Procedure: TRANSURETHRAL RESECTION OF THE PROSTATE WITH GYRUS INSTRUMENTS;  Surgeon: Malka So, MD;  Location: [facility];  Service: Urology;  Laterality: N/A;    Current Meds  Medication Sig  . aspirin EC 81 MG EC tablet Take 1 tablet (81 mg total) by mouth daily.  Marland Kitchen atorvastatin (LIPITOR) 40 MG tablet Take 1 tablet (40 mg total) by mouth at bedtime.  . clopidogrel (PLAVIX) 75 MG tablet Take 75 mg by mouth daily.  . isosorbide mononitrate (IMDUR) 30 MG 24 hr tablet Take 0.5 tablets (15 mg total) by mouth daily.  Marland Kitchen ketoconazole (NIZORAL) 2 % cream Apply 1 application topically 2 (two) times daily.  . metoprolol succinate (TOPROL-XL) 25 MG 24 hr tablet Take 1 tablet (25 mg total) by mouth daily. Take with or immediately following a meal.  . nitroGLYCERIN (NITROSTAT) 0.4 MG SL tablet Place 1 tablet (0.4 mg total) under the tongue every 5 (five) minutes as needed for chest pain.  . pantoprazole (PROTONIX) 40 MG tablet  Take 1 tablet (40 mg total) by mouth daily.  . polyethylene glycol (MIRALAX / GLYCOLAX) packet Take 17 g by mouth daily.    Allergies:   Adhesive [tape]; Iodine; and Niacin and related   Social History:  The patient  reports that he has never smoked. He has never used smokeless tobacco. He reports that he does not drink alcohol or use drugs.   Family History:  The patient's family history includes Breast cancer in his mother; Colon cancer in his brother; Stroke in his father.  ROS:   Review of Systems  Unable to perform ROS: Dementia     PHYSICAL EXAM:  VS:  BP 120/78 (BP Location: Left Arm, Patient Position: Sitting, Cuff Size: Normal)   Pulse 78   Ht 5\' 4"  (1.626 m)   Wt 180 lb 12 oz (82 kg)   BMI 31.03 kg/m  BMI: Body mass index is 31.03 kg/m.  Physical Exam  Constitutional: He is oriented to person, place, and time. He appears well-developed and well-nourished.  HENT:  Head: Normocephalic and atraumatic.  Eyes: Right eye exhibits no discharge. Left eye exhibits no discharge.  Neck: Normal range of motion. No JVD present.  Cardiovascular: Normal rate, regular rhythm, S1 normal, S2 normal and normal heart sounds. Exam reveals no distant heart sounds, no friction rub, no midsystolic click and no opening snap.  No murmur heard. Pulses:      Posterior tibial pulses are 2+ on the right side and 2+ on the left side.  Left radial cath site is well-healing without active bleeding, bruising, swelling, warmth, erythema, or tenderness to palpation.  Radial pulse 2+.  Pulmonary/Chest: Effort normal and breath sounds normal. No respiratory distress. He has no decreased breath sounds. He has no wheezes. He has no rales. He exhibits no tenderness.  Abdominal: Soft. He exhibits no distension. There is no abdominal tenderness.  Musculoskeletal:        General: No edema.  Neurological: He is alert and oriented to person, place, and time.  Skin: Skin is warm and dry. No cyanosis. Nails show  no clubbing.  Psychiatric: He has a normal mood and affect. His speech is normal and behavior is normal. Judgment and thought content normal.     EKG:  Was ordered and interpreted by me today. Shows NSR, 78 bpm, TWI aVL  Recent Labs: 08/25/2017: TSH 4.94 02/19/2018: ALT 20 04/01/2018: BUN 11; Creatinine, Ser 0.92; Potassium 4.3; Sodium 135 04/02/2018: Hemoglobin 12.6; Platelets 174  03/31/2018: Cholesterol 137; HDL 42; LDL Cholesterol 82; Total CHOL/HDL Ratio 3.3; Triglycerides 64; VLDL 13   Estimated Creatinine Clearance: 54.6 mL/min (by C-G formula based on SCr of 0.92 mg/dL).   Wt Readings from Last 3 Encounters:  04/17/18 180 lb 12 oz (82 kg)  04/13/18 181 lb 14.4 oz (82.5 kg)  04/07/18 186 lb 7 oz (84.6 kg)     Other studies reviewed: Additional studies/records reviewed today include: summarized above  ASSESSMENT AND PLAN:  1. CAD involving the native coronary arteries and bypass grafts without angina: He is doing well from a cardiac perspective.  Continue dual antiplatelet therapy with aspirin and Plavix.  Increase Imdur to 30 mg daily given brief episodes of chest pain as outlined in the HPI.  Given the patient's known coronary anatomy, advanced age, and potential dementia, would continue to prefer medical management and reserve intervention for refractory angina despite optimization of medical therapy.  Continue with cardiac rehab.  Aggressive secondary prevention.  Post-cath instructions discussed in detail.  2. ICM: Anterolateral hypokinesis noted on imaging as above consistent with known diagonal disease.  Overall LVEF is normal.  He appears well compensated and euvolemic.  Continue current dose of Toprol-XL.  No indication for standing diuretic at this time.  3. Hypertension: Blood pressure is well controlled today.  Increase Imdur as above.  Otherwise, he will remain on current dose of Toprol-XL.  4. Hyperlipidemia: LDL of 82 during recent admission.  This was while on Lipitor  40 mg daily.  In this setting, we will titrate his Lipitor to 80 mg daily.  Recheck fasting lipid and liver function in approximately 8 weeks.  Goal LDL less than 70.  5. Possible dementia versus confusion: Recommend he follow-up with PCP.  Disposition: F/u with Dr. Saunders Revel or an APP in 8 weeks.  Current medicines are reviewed at length with the patient today.  The patient did not have any concerns regarding medicines.  Signed, Christell Faith, PA-C 04/17/2018 10:06 AM     [facility]  [place], [place] [address] [phone]

## 2018-04-17 ENCOUNTER — Encounter: Payer: Self-pay | Admitting: Physician Assistant

## 2018-04-17 ENCOUNTER — Encounter: Payer: Medicare Other | Admitting: *Deleted

## 2018-04-17 ENCOUNTER — Ambulatory Visit (INDEPENDENT_AMBULATORY_CARE_PROVIDER_SITE_OTHER): Payer: Medicare Other | Admitting: Physician Assistant

## 2018-04-17 VITALS — BP 120/78 | HR 78 | Ht 64.0 in | Wt 180.8 lb

## 2018-04-17 DIAGNOSIS — I255 Ischemic cardiomyopathy: Secondary | ICD-10-CM | POA: Diagnosis not present

## 2018-04-17 DIAGNOSIS — H00019 Hordeolum externum unspecified eye, unspecified eyelid: Secondary | ICD-10-CM | POA: Diagnosis not present

## 2018-04-17 DIAGNOSIS — I10 Essential (primary) hypertension: Secondary | ICD-10-CM | POA: Diagnosis not present

## 2018-04-17 DIAGNOSIS — E785 Hyperlipidemia, unspecified: Secondary | ICD-10-CM | POA: Diagnosis not present

## 2018-04-17 DIAGNOSIS — R413 Other amnesia: Secondary | ICD-10-CM | POA: Diagnosis not present

## 2018-04-17 DIAGNOSIS — I214 Non-ST elevation (NSTEMI) myocardial infarction: Secondary | ICD-10-CM

## 2018-04-17 DIAGNOSIS — Z951 Presence of aortocoronary bypass graft: Secondary | ICD-10-CM | POA: Diagnosis not present

## 2018-04-17 DIAGNOSIS — I25810 Atherosclerosis of coronary artery bypass graft(s) without angina pectoris: Secondary | ICD-10-CM | POA: Diagnosis not present

## 2018-04-17 DIAGNOSIS — Z9861 Coronary angioplasty status: Secondary | ICD-10-CM | POA: Diagnosis not present

## 2018-04-17 MED ORDER — ISOSORBIDE MONONITRATE ER 30 MG PO TB24: 30.0000 mg | ORAL_TABLET | Freq: Every day | ORAL | 5 refills | Status: DC

## 2018-04-17 MED ORDER — ATORVASTATIN CALCIUM 80 MG PO TABS: 80.0000 mg | ORAL_TABLET | Freq: Every day | ORAL | 5 refills | Status: DC

## 2018-04-17 NOTE — Progress Notes (Signed)
Daily Session Note  Patient Details  Name: Jason Velazquez MRN: [id number] Date of Birth: 09-Nov-1928 Referring Provider:     Cardiac Rehab from 04/13/2018 in [facility] Cardiac and Pulmonary Rehab  Referring Provider  End, Harrell Gave MD      Encounter Date: 04/17/2018  Check In: Session Check In - 04/17/18 0742      Check-In   Supervising physician immediately available to respond to emergencies  See telemetry face sheet for immediately available ER MD    Location  [facility]-Cardiac & Pulmonary Rehab    Staff Present  Renita Papa, RN BSN;Jessica Luan Pulling, MA, RCEP, CCRP, Exercise Physiologist;Amanda Oletta Darter, [place], ACSM CEP, Exercise Physiologist    Medication changes reported      No    Fall or balance concerns reported     No    Warm-up and Cool-down  Performed as group-led instruction    Resistance Training Performed  Yes    VAD Patient?  No    PAD/SET Patient?  No      Pain Assessment   Currently in Pain?  No/denies          Social History   Tobacco Use  Smoking Status Never Smoker  Smokeless Tobacco Never Used    Goals Met:  Exercise tolerated well Personal goals reviewed No report of cardiac concerns or symptoms Strength training completed today  Goals Unmet:  Not Applicable  Comments: First full day of exercise!  Patient was oriented to gym and equipment including functions, settings, policies, and procedures.  Patient's individual exercise prescription and treatment plan were reviewed.  All starting workloads were established based on the results of the 6 minute walk test done at initial orientation visit.  The plan for exercise progression was also introduced and progression will be customized based on patient's performance and goals.    Dr. Emily Filbert is Medical Director for [facility] and [facility].

## 2018-04-17 NOTE — Patient Instructions (Signed)
Medication Instructions:  Your physician has recommended you make the following change in your medication:   1) INCREASE Atorvastatin 80mg  daily. 2) INCREASE Imdur 30mg  daily.  If you need a refill on your cardiac medications before your next appointment, please call your pharmacy.   Lab work: None ordered  Testing/Procedures: None ordered  Follow-Up: At [facility], you and your health needs are our priority.  As part of our continuing mission to provide you with exceptional heart care, we have created designated Provider Care Teams.  These Care Teams include your primary Cardiologist (physician) and Advanced Practice Providers (APPs -  Physician Assistants and Nurse Practitioners) who all work together to provide you with the care you need, when you need it. You will need a follow up appointment in 8 weeks.   You may see Nelva Bush, MD or one of the following Advanced Practice Providers on your designated Care Team:   Murray Hodgkins, NP Christell Faith, PA-C . Marrianne Mood, PA-C

## 2018-04-19 ENCOUNTER — Telehealth: Payer: Self-pay | Admitting: Family Medicine

## 2018-04-19 NOTE — Telephone Encounter (Signed)
Please check with patient's daughter.  Patient was reportedly confused at cardiology visit.  See what details you can get, 30-minute office visit if needed, have daughter come to the visit if he is scheduled.  Thanks.

## 2018-04-20 ENCOUNTER — Encounter: Payer: Medicare Other | Admitting: *Deleted

## 2018-04-20 DIAGNOSIS — I214 Non-ST elevation (NSTEMI) myocardial infarction: Secondary | ICD-10-CM | POA: Diagnosis not present

## 2018-04-20 DIAGNOSIS — I25810 Atherosclerosis of coronary artery bypass graft(s) without angina pectoris: Secondary | ICD-10-CM | POA: Diagnosis not present

## 2018-04-20 DIAGNOSIS — R413 Other amnesia: Secondary | ICD-10-CM | POA: Diagnosis not present

## 2018-04-20 DIAGNOSIS — H00019 Hordeolum externum unspecified eye, unspecified eyelid: Secondary | ICD-10-CM | POA: Diagnosis not present

## 2018-04-20 NOTE — Telephone Encounter (Signed)
Daughter noted that patient did have confusion to the effect that she had to clarify some things with him but this has been happening for a while.  Appointment scheduled.

## 2018-04-20 NOTE — Telephone Encounter (Signed)
Noted. Thanks.

## 2018-04-20 NOTE — Progress Notes (Signed)
Daily Session Note  Patient Details  Name: Jason Velazquez MRN: [id number] Date of Birth: 06/08/1928 Referring Provider:     Cardiac Rehab from 04/13/2018 in [facility] Cardiac and Pulmonary Rehab  Referring Provider  End, Christopher MD      Encounter Date: 04/20/2018  Check In: Session Check In - 04/20/18 0756      Check-In   Supervising physician immediately available to respond to emergencies  See telemetry face sheet for immediately available ER MD    Location  [facility]-Cardiac & Pulmonary Rehab    Staff Present  Susanne Bice, RN, BSN, CCRP; , MA, RCEP, CCRP, Exercise Physiologist;Kelly Hayes, BS, ACSM CEP, Exercise Physiologist    Medication changes reported      No    Fall or balance concerns reported     No    Warm-up and Cool-down  Performed as group-led instruction    Resistance Training Performed  Yes    VAD Patient?  No    PAD/SET Patient?  No      Pain Assessment   Currently in Pain?  No/denies          Social History   Tobacco Use  Smoking Status Never Smoker  Smokeless Tobacco Never Used    Goals Met:  Independence with exercise equipment Exercise tolerated well No report of cardiac concerns or symptoms Strength training completed today  Goals Unmet:  Not Applicable  Comments: Pt able to follow exercise prescription today without complaint.  Will continue to monitor for progression.    Dr. Mark Miller is Medical Director for [facility] and [facility]. 

## 2018-04-23 ENCOUNTER — Other Ambulatory Visit: Payer: Self-pay

## 2018-04-23 ENCOUNTER — Ambulatory Visit (INDEPENDENT_AMBULATORY_CARE_PROVIDER_SITE_OTHER): Payer: Medicare Other | Admitting: Family Medicine

## 2018-04-23 ENCOUNTER — Encounter: Payer: Self-pay | Admitting: Family Medicine

## 2018-04-23 VITALS — BP 138/70 | HR 69 | Temp 98.2°F | Ht 64.0 in | Wt 184.4 lb

## 2018-04-23 DIAGNOSIS — R413 Other amnesia: Secondary | ICD-10-CM

## 2018-04-23 DIAGNOSIS — I255 Ischemic cardiomyopathy: Secondary | ICD-10-CM | POA: Diagnosis not present

## 2018-04-23 DIAGNOSIS — E538 Deficiency of other specified B group vitamins: Secondary | ICD-10-CM

## 2018-04-23 DIAGNOSIS — H00019 Hordeolum externum unspecified eye, unspecified eyelid: Secondary | ICD-10-CM

## 2018-04-23 DIAGNOSIS — I25810 Atherosclerosis of coronary artery bypass graft(s) without angina pectoris: Secondary | ICD-10-CM | POA: Diagnosis not present

## 2018-04-23 NOTE — Progress Notes (Signed)
Here today with his daughter, who supplements history.    He got flustered at the cards appointment.  In retrospect, he thought he was a little mixed up at the visit.  He has some difficulty with recalling details.  He had some chest discomfort, intermittently, one day after the most recent hospitalization.  No NTG use.  He didn't recall all of that at the cards visit.  Discussed.  Per his daughter, he was telling others that he had CABG but he actually had a cardiac cath.  He did not have a CABG.  Imdur was increased along with atorvastatin, discussed with patient.  He has some days when his memory is better than others.  Some days with more trouble with recall.   No CP since the cardiology visit.    He has some occ BLE edema, worse at the end of the day.    R lower lid stye recently noted, appears to be some better in the meantime.   He is off B12 replacement in the meantime.  Meds, vitals, and allergies reviewed.   ROS: Per HPI unless specifically indicated in ROS section   GEN: nad, alert and pleasant in conversation but he has some difficulty with recall of recent events. HEENT: mucous membranes moist  Small stye noted on right lower eyelid. He has some perioral irritation.  L oral crease with irritation but no ulceration.   NECK: supple w/o LA CV: rrr PULM: ctab, no inc wob ABD: soft, +bs EXT: trace BLE edema SKIN: Well-perfused

## 2018-04-23 NOTE — Patient Instructions (Signed)
Use warm compresses on the stye.  Go to the lab on the way out.  We'll contact you with your lab report. We'll see about follow up B12 treatment/shots.  Take care.  Glad to see you.  Update me as needed.

## 2018-04-24 ENCOUNTER — Encounter: Payer: Medicare Other | Admitting: *Deleted

## 2018-04-24 DIAGNOSIS — I25810 Atherosclerosis of coronary artery bypass graft(s) without angina pectoris: Secondary | ICD-10-CM | POA: Diagnosis not present

## 2018-04-24 DIAGNOSIS — I214 Non-ST elevation (NSTEMI) myocardial infarction: Secondary | ICD-10-CM

## 2018-04-24 DIAGNOSIS — R413 Other amnesia: Secondary | ICD-10-CM | POA: Diagnosis not present

## 2018-04-24 DIAGNOSIS — H00019 Hordeolum externum unspecified eye, unspecified eyelid: Secondary | ICD-10-CM | POA: Diagnosis not present

## 2018-04-24 LAB — VITAMIN B12: Vitamin B-12: 275 pg/mL (ref 211–911)

## 2018-04-24 NOTE — Progress Notes (Signed)
Daily Session Note  Patient Details  Name: Jason Velazquez MRN: [id number] Date of Birth: [birth year] Referring Provider:     Cardiac Rehab from 04/13/2018 in [facility] Cardiac and Pulmonary Rehab  Referring Provider  End, Harrell Gave MD      Encounter Date: 04/24/2018  Check In: Session Check In - 04/24/18 0757      Check-In   Supervising physician immediately available to respond to emergencies  See telemetry face sheet for immediately available ER MD    Location  [facility]-Cardiac & Pulmonary Rehab    Staff Present  Darel Hong, RN Vickki Hearing, BA, ACSM CEP, Exercise Physiologist;Jessica Luan Pulling, MA, RCEP, CCRP, Exercise Physiologist    Medication changes reported      No    Fall or balance concerns reported     No    Warm-up and Cool-down  Performed as group-led instruction    Resistance Training Performed  Yes    VAD Patient?  No    PAD/SET Patient?  No      Pain Assessment   Currently in Pain?  No/denies          Social History   Tobacco Use  Smoking Status Never Smoker  Smokeless Tobacco Never Used    Goals Met:  Independence with exercise equipment Exercise tolerated well No report of cardiac concerns or symptoms Strength training completed today  Goals Unmet:  Not Applicable  Comments: Pt able to follow exercise prescription today without complaint.  Will continue to monitor for progression.    Dr. Emily Filbert is Medical Director for [facility] and [facility].

## 2018-04-26 ENCOUNTER — Other Ambulatory Visit: Payer: Self-pay | Admitting: Family Medicine

## 2018-04-26 DIAGNOSIS — E538 Deficiency of other specified B group vitamins: Secondary | ICD-10-CM

## 2018-04-26 DIAGNOSIS — H00019 Hordeolum externum unspecified eye, unspecified eyelid: Secondary | ICD-10-CM | POA: Insufficient documentation

## 2018-04-26 MED ORDER — CYANOCOBALAMIN 1000 MCG/ML IJ SOLN: 1000.0000 ug | INTRAMUSCULAR | Status: DC

## 2018-04-26 NOTE — Assessment & Plan Note (Signed)
Per cardiology.  See above.

## 2018-04-26 NOTE — Assessment & Plan Note (Addendum)
This may be an issue for the patient.  He is off B12 replacement right now.  His memory may be a little bit worse more recently.  He does not have focal neurologic symptoms concerning for stroke.  We talked about options.  Reasonable to check B12 level today.  See notes on labs.  I do not think his other medicine changes are causative.  Discussed. >25 minutes spent in face to face time with patient, >50% spent in counselling or coordination of care.

## 2018-04-26 NOTE — Assessment & Plan Note (Signed)
See above

## 2018-04-26 NOTE — Assessment & Plan Note (Signed)
Should resolve with warm compresses.    The perioral irritation that appears to be a separate issue that should also resolve with over-the-counter hand cream.

## 2018-04-28 ENCOUNTER — Other Ambulatory Visit: Payer: Self-pay | Admitting: Family Medicine

## 2018-04-28 DIAGNOSIS — L570 Actinic keratosis: Secondary | ICD-10-CM | POA: Diagnosis not present

## 2018-04-28 DIAGNOSIS — Z85820 Personal history of malignant melanoma of skin: Secondary | ICD-10-CM | POA: Diagnosis not present

## 2018-04-28 DIAGNOSIS — L821 Other seborrheic keratosis: Secondary | ICD-10-CM | POA: Diagnosis not present

## 2018-04-28 DIAGNOSIS — Z85828 Personal history of other malignant neoplasm of skin: Secondary | ICD-10-CM | POA: Diagnosis not present

## 2018-04-28 DIAGNOSIS — D225 Melanocytic nevi of trunk: Secondary | ICD-10-CM | POA: Diagnosis not present

## 2018-04-28 DIAGNOSIS — L720 Epidermal cyst: Secondary | ICD-10-CM | POA: Diagnosis not present

## 2018-04-28 DIAGNOSIS — K130 Diseases of lips: Secondary | ICD-10-CM | POA: Diagnosis not present

## 2018-04-28 DIAGNOSIS — Z08 Encounter for follow-up examination after completed treatment for malignant neoplasm: Secondary | ICD-10-CM | POA: Diagnosis not present

## 2018-04-28 DIAGNOSIS — X32XXXA Exposure to sunlight, initial encounter: Secondary | ICD-10-CM | POA: Diagnosis not present

## 2018-04-28 MED ORDER — CYANOCOBALAMIN 1000 MCG/ML IJ SOLN: 1000.0000 ug | INTRAMUSCULAR | 12 refills | Status: DC

## 2018-04-30 ENCOUNTER — Ambulatory Visit: Payer: Medicare Other | Admitting: Nurse Practitioner

## 2018-05-06 ENCOUNTER — Encounter: Payer: Self-pay | Admitting: *Deleted

## 2018-05-06 DIAGNOSIS — I214 Non-ST elevation (NSTEMI) myocardial infarction: Secondary | ICD-10-CM

## 2018-05-06 NOTE — Progress Notes (Signed)
Cardiac Individual Treatment Plan  Patient Details  Name: Jason Velazquez MRN: [id number] Date of Birth: [birth year] Referring Provider:     Cardiac Rehab from 04/13/2018 in [facility] Cardiac and Pulmonary Rehab  Referring Provider  End, Harrell Gave MD      Initial Encounter Date:    Cardiac Rehab from 04/13/2018 in [facility] Cardiac and Pulmonary Rehab  Date  04/13/18      Visit Diagnosis: NSTEMI (non-ST elevated myocardial infarction) [facility])  Patient's Home Medications on Admission:  Current Outpatient Medications:  .  aspirin EC 81 MG EC tablet, Take 1 tablet (81 mg total) by mouth daily., Disp: 30 tablet, Rfl: 0 .  atorvastatin (LIPITOR) 80 MG tablet, Take 1 tablet (80 mg total) by mouth at bedtime., Disp: 30 tablet, Rfl: 5 .  clopidogrel (PLAVIX) 75 MG tablet, Take 75 mg by mouth daily., Disp: , Rfl:  .  cyanocobalamin (,VITAMIN B-12,) 1000 MCG/ML injection, Inject 1 mL (1,000 mcg total) into the muscle every 30 (thirty) days., Disp: 1 mL, Rfl: 12 .  isosorbide mononitrate (IMDUR) 30 MG 24 hr tablet, Take 1 tablet (30 mg total) by mouth daily., Disp: 30 tablet, Rfl: 5 .  ketoconazole (NIZORAL) 2 % cream, Apply 1 application topically 2 (two) times daily., Disp: 60 g, Rfl: 1 .  metoprolol succinate (TOPROL-XL) 25 MG 24 hr tablet, Take 1 tablet (25 mg total) by mouth daily. Take with or immediately following a meal., Disp: 90 tablet, Rfl: 3 .  nitroGLYCERIN (NITROSTAT) 0.4 MG SL tablet, Place 1 tablet (0.4 mg total) under the tongue every 5 (five) minutes as needed for chest pain., Disp: 30 tablet, Rfl: 0 .  pantoprazole (PROTONIX) 40 MG tablet, Take 1 tablet (40 mg total) by mouth daily., Disp: 90 tablet, Rfl: 3 .  polyethylene glycol (MIRALAX / GLYCOLAX) packet, Take 17 g by mouth daily., Disp: , Rfl:   Past Medical History: Past Medical History:  Diagnosis Date  . Allergic rhinitis   . Benign prostatic hypertrophy   . Coronary artery disease CARDIOLOGIST -- DR Daneen Schick  . Duodenal  diverticulum 2003  . GERD (gastroesophageal reflux disease)   . H/O hiatal hernia 2003  . Heart murmur   . Hepatic lesion 2004  . History of melanoma excision    FACE  . Hypercholesteremia   . Hypertension   . Left carotid stenosis    > 50%  PER DUPLEX  03-28-2011  . Mild mitral regurgitation   . Perianal fistula   . S/P CABG x 4    1988  . S/P drug eluting coronary stent placement    POST CABG--  STENTING 2000  AND RESTENTING 2006 FOR REINSTENT STENOSIS  . Stroke [facility])     Tobacco Use: Social History   Tobacco Use  Smoking Status Never Smoker  Smokeless Tobacco Never Used    Labs: Recent Review Flowsheet Data    Labs for ITP Cardiac and Pulmonary Rehab Latest Ref Rng & Units 05/27/2013 03/15/2016 02/18/2017 02/19/2018 03/31/2018   Cholestrol 0 - 200 mg/dL 126 142 150 128 137   LDLCALC 0 - 99 mg/dL 69 79 75 65 82   HDL >40 mg/dL 40 42.60 41.50 35.30(L) 42   Trlycerides <150 mg/dL 85 103.0 170.0(H) 136.0 64   Hemoglobin A1c <5.7 % 5.8(H) - - - -       Exercise Target Goals: Exercise Program Goal: Individual exercise prescription set using results from initial 6 min walk test and THRR while considering  patient's activity barriers and  safety.   Exercise Prescription Goal: Initial exercise prescription builds to 30-45 minutes a day of aerobic activity, 2-3 days per week.  Home exercise guidelines will be given to patient during program as part of exercise prescription that the participant will acknowledge.  Activity Barriers & Risk Stratification: Activity Barriers & Cardiac Risk Stratification - 04/13/18 1259      Activity Barriers & Cardiac Risk Stratification   Activity Barriers  Deconditioning;Muscular Weakness;Balance Concerns    Cardiac Risk Stratification  High       6 Minute Walk: 6 Minute Walk    Row Name 04/13/18 1504         6 Minute Walk   Phase  Initial     Distance  1268 feet     Walk Time  6 minutes     # of Rest Breaks  0     MPH  2.4     METS   2.09     RPE  13     VO2 Peak  7.31     Symptoms  No     Resting HR  67 bpm     Resting BP  132/70     Resting Oxygen Saturation   99 %     Exercise Oxygen Saturation  during 6 min walk  99 %     Max Ex. HR  111 bpm     Max Ex. BP  158/64     2 Minute Post BP  130/70        Oxygen Initial Assessment:   Oxygen Re-Evaluation:   Oxygen Discharge (Final Oxygen Re-Evaluation):   Initial Exercise Prescription: Initial Exercise Prescription - 04/13/18 1500      Date of Initial Exercise RX and Referring Provider   Date  04/13/18    Referring Provider  End, Christopher MD      Treadmill   MPH  1.4    Grade  0.5    Minutes  15    METs  2.17      NuStep   Level  1    SPM  80    Minutes  15    METs  2.1      Biostep-RELP   Level  1    SPM  50    Minutes  15    METs  2      Prescription Details   Frequency (times per week)  3    Duration  Progress to 45 minutes of aerobic exercise without signs/symptoms of physical distress      Intensity   THRR 40-80% of Max Heartrate  93-120    Ratings of Perceived Exertion  11-13    Perceived Dyspnea  0-4      Progression   Progression  Continue to progress workloads to maintain intensity without signs/symptoms of physical distress.      Resistance Training   Training Prescription  Yes    Weight  3 lbs    Reps  10-15       Perform Capillary Blood Glucose checks as needed.  Exercise Prescription Changes: Exercise Prescription Changes    Row Name 04/13/18 1200 04/28/18 1600           Response to Exercise   Blood Pressure (Admit)  132/70  128/74      Blood Pressure (Exercise)  158/64  152/74      Blood Pressure (Exit)  130/70  120/70      Heart Rate (Admit)  67 bpm  67 bpm      Heart Rate (Exercise)  111 bpm  92 bpm      Heart Rate (Exit)  70 bpm  68 bpm      Oxygen Saturation (Admit)  99 %  -      Oxygen Saturation (Exercise)  99 %  -      Rating of Perceived Exertion (Exercise)  13  14      Symptoms  none   none      Comments  walk test results  -      Duration  -  Progress to 45 minutes of aerobic exercise without signs/symptoms of physical distress      Intensity  -  THRR unchanged        Progression   Progression  -  Continue to progress workloads to maintain intensity without signs/symptoms of physical distress.      Average METs  -  2.32        Resistance Training   Training Prescription  -  Yes      Weight  -  3 lbs      Reps  -  10-15        Interval Training   Interval Training  -  No        Treadmill   MPH  -  1.4      Grade  -  0.5      Minutes  -  15      METs  -  2.17        NuStep   Level  -  1      Minutes  -  15      METs  -  2.8        Biostep-RELP   Level  -  1      Minutes  -  15      METs  -  2        Home Exercise Plan   Plans to continue exercise at  -  Home (comment) walking, videos      Frequency  -  Add 2 additional days to program exercise sessions.      Initial Home Exercises Provided  -  04/28/18 info mailed out         Exercise Comments: Exercise Comments    Row Name 04/17/18 [phone]           Exercise Comments  First full day of exercise!  Patient was oriented to gym and equipment including functions, settings, policies, and procedures.  Patient's individual exercise prescription and treatment plan were reviewed.  All starting workloads were established based on the results of the 6 minute walk test done at initial orientation visit.  The plan for exercise progression was also introduced and progression will be customized based on patient's performance and goals.          Exercise Goals and Review: Exercise Goals    Row Name 04/13/18 1506             Exercise Goals   Increase Physical Activity  Yes       Intervention  Provide advice, education, support and counseling about physical activity/exercise needs.;Develop an individualized exercise prescription for aerobic and resistive training based on initial evaluation findings, risk  stratification, comorbidities and participant's personal goals.       Expected Outcomes  Short Term: Attend rehab on a regular basis to increase amount of physical activity.;Long Term: Add in  home exercise to make exercise part of routine and to increase amount of physical activity.;Long Term: Exercising regularly at least 3-5 days a week.       Increase Strength and Stamina  Yes       Intervention  Provide advice, education, support and counseling about physical activity/exercise needs.;Develop an individualized exercise prescription for aerobic and resistive training based on initial evaluation findings, risk stratification, comorbidities and participant's personal goals.       Expected Outcomes  Short Term: Increase workloads from initial exercise prescription for resistance, speed, and METs.;Short Term: Perform resistance training exercises routinely during rehab and add in resistance training at home;Long Term: Improve cardiorespiratory fitness, muscular endurance and strength as measured by increased METs and functional capacity (6MWT)       Able to understand and use rate of perceived exertion (RPE) scale  Yes       Intervention  Provide education and explanation on how to use RPE scale       Expected Outcomes  Short Term: Able to use RPE daily in rehab to express subjective intensity level;Long Term:  Able to use RPE to guide intensity level when exercising independently       Knowledge and understanding of Target Heart Rate Range (THRR)  Yes       Intervention  Provide education and explanation of THRR including how the numbers were predicted and where they are located for reference       Expected Outcomes  Short Term: Able to state/look up THRR;Short Term: Able to use daily as guideline for intensity in rehab;Long Term: Able to use THRR to govern intensity when exercising independently       Able to check pulse independently  Yes       Intervention  Provide education and demonstration on how to  check pulse in carotid and radial arteries.;Review the importance of being able to check your own pulse for safety during independent exercise       Expected Outcomes  Short Term: Able to explain why pulse checking is important during independent exercise;Long Term: Able to check pulse independently and accurately       Understanding of Exercise Prescription  Yes       Intervention  Provide education, explanation, and written materials on patient's individual exercise prescription       Expected Outcomes  Short Term: Able to explain program exercise prescription;Long Term: Able to explain home exercise prescription to exercise independently          Exercise Goals Re-Evaluation : Exercise Goals Re-Evaluation    Row Name 04/17/18 0744 04/28/18 1602 05/01/18 0836         Exercise Goal Re-Evaluation   Exercise Goals Review  Increase Physical Activity;Increase Strength and Stamina;Understanding of Exercise Prescription  Increase Physical Activity;Increase Strength and Stamina;Understanding of Exercise Prescription  Increase Physical Activity;Increase Strength and Stamina;Understanding of Exercise Prescription     Comments  Reviewed RPE scale, THR and program prescription with pt today.  Pt voiced understanding and was given a copy of goals to take home.   Jason Velazquez is off to a good start in rehab. He has completed two full sessions of exercise.  We have mailed out his home exercise packet in order for him to continue to exercise at home.  We will follow up with phone calls and continue to track his progress.   Jason Velazquez has started to walk at home.  This morning he did 2.5 miles in 47 min.  He does  not have an email and does not do computers so he does not have access to our emails or videos.  I reviewed the home exercise guidelines with him over the phone.  I sent the packet in mail and told him to let me know if he has questions.      Expected Outcomes  Short: Use RPE daily to regulate intensity. Long: Follow  program prescription in THR.  Short: Continue to exercise by walking at home.  Long: Continue to increase strength and stamina.   Short: Continue to walk at home.  Long: Continue to increase strength and stamina.         Discharge Exercise Prescription (Final Exercise Prescription Changes): Exercise Prescription Changes - 04/28/18 1600      Response to Exercise   Blood Pressure (Admit)  128/74    Blood Pressure (Exercise)  152/74    Blood Pressure (Exit)  120/70    Heart Rate (Admit)  67 bpm    Heart Rate (Exercise)  92 bpm    Heart Rate (Exit)  68 bpm    Rating of Perceived Exertion (Exercise)  14    Symptoms  none    Duration  Progress to 45 minutes of aerobic exercise without signs/symptoms of physical distress    Intensity  THRR unchanged      Progression   Progression  Continue to progress workloads to maintain intensity without signs/symptoms of physical distress.    Average METs  2.32      Resistance Training   Training Prescription  Yes    Weight  3 lbs    Reps  10-15      Interval Training   Interval Training  No      Treadmill   MPH  1.4    Grade  0.5    Minutes  15    METs  2.17      NuStep   Level  1    Minutes  15    METs  2.8      Biostep-RELP   Level  1    Minutes  15    METs  2      Home Exercise Plan   Plans to continue exercise at  Home (comment)   walking, videos   Frequency  Add 2 additional days to program exercise sessions.    Initial Home Exercises Provided  04/28/18   info mailed out      Nutrition:  Target Goals: Understanding of nutrition guidelines, daily intake of sodium <1576m, cholesterol <2012m calories 30% from fat and 7% or less from saturated fats, daily to have 5 or more servings of fruits and vegetables.  Biometrics: Pre Biometrics - 04/13/18 1507      Pre Biometrics   Height  5' 5.5" (1.664 m)    Weight  181 lb 14.4 oz (82.5 kg)    Waist Circumference  41 inches    Hip Circumference  42.5 inches    Waist to Hip  Ratio  0.96 %    BMI (Calculated)  29.8    Single Leg Stand  6.85 seconds        Nutrition Therapy Plan and Nutrition Goals: Nutrition Therapy & Goals - 04/13/18 1302      Intervention Plan   Intervention  Prescribe, educate and counsel regarding individualized specific dietary modifications aiming towards targeted core components such as weight, hypertension, lipid management, diabetes, heart failure and other comorbidities.    Expected Outcomes  Short Term Goal: Understand  basic principles of dietary content, such as calories, fat, sodium, cholesterol and nutrients.;Short Term Goal: A plan has been developed with personal nutrition goals set during dietitian appointment.;Long Term Goal: Adherence to prescribed nutrition plan.       Nutrition Assessments: Nutrition Assessments - 04/13/18 1302      MEDFICTS Scores   Pre Score  52       Nutrition Goals Re-Evaluation:   Nutrition Goals Discharge (Final Nutrition Goals Re-Evaluation):   Psychosocial: Target Goals: Acknowledge presence or absence of significant depression and/or stress, maximize coping skills, provide positive support system. Participant is able to verbalize types and ability to use techniques and skills needed for reducing stress and depression.   Initial Review & Psychosocial Screening: Initial Psych Review & Screening - 04/13/18 1300      Initial Review   Current issues with  None Identified      Family Dynamics   Good Support System?  Yes   Daughter Jacqlyn Larsen and Son Richardson Landry     Barriers   Psychosocial barriers to participate in program  There are no identifiable barriers or psychosocial needs.      Screening Interventions   Interventions  Encouraged to exercise;To provide support and resources with identified psychosocial needs;Provide feedback about the scores to participant    Expected Outcomes  Short Term goal: Utilizing psychosocial counselor, staff and physician to assist with identification of  specific Stressors or current issues interfering with healing process. Setting desired goal for each stressor or current issue identified.;Long Term Goal: Stressors or current issues are controlled or eliminated.;Short Term goal: Identification and review with participant of any Quality of Life or Depression concerns found by scoring the questionnaire.;Long Term goal: The participant improves quality of Life and PHQ9 Scores as seen by post scores and/or verbalization of changes       Quality of Life Scores:  Quality of Life - 04/13/18 1301      Quality of Life   Select  Quality of Life      Quality of Life Scores   Health/Function Pre  27.29 %    Socioeconomic Pre  24 %    Psych/Spiritual Pre  29.29 %    Family Pre  30 %    GLOBAL Pre  27.59 %      Scores of 19 and below usually indicate a poorer quality of life in these areas.  A difference of  2-3 points is a clinically meaningful difference.  A difference of 2-3 points in the total score of the Quality of Life Index has been associated with significant improvement in overall quality of life, self-image, physical symptoms, and general health in studies assessing change in quality of life.  PHQ-9: Recent Review Flowsheet Data    Depression screen [facility] 2/9 04/13/2018 02/19/2018 02/18/2017 11/02/2015   Decreased Interest 0 0 0 0   Down, Depressed, Hopeless 0 0 0 1   PHQ - 2 Score 0 0 0 1   Altered sleeping 0 0 0 -   Tired, decreased energy 0 0 0 -   Change in appetite 0 0 0 -   Feeling bad or failure about yourself  0 0 0 -   Trouble concentrating 0 0 0 -   Moving slowly or fidgety/restless 0 0 0 -   Suicidal thoughts 0 0 0 -   PHQ-9 Score 0 0 0 -   Difficult doing work/chores Not difficult at all Not difficult at all Not difficult at all -  Interpretation of Total Score  Total Score Depression Severity:  1-4 = Minimal depression, 5-9 = Mild depression, 10-14 = Moderate depression, 15-19 = Moderately severe depression, 20-27 = Severe  depression   Psychosocial Evaluation and Intervention: Psychosocial Evaluation - 04/20/18 0947      Psychosocial Evaluation & Interventions   Interventions  Encouraged to exercise with the program and follow exercise prescription    Comments  K. Lavina Hamman counselor charting in [place]. Sommer's session: conversation regarding heart attack which led to patient's participation in program, this was 2nd heart attack.  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  Two spouses have died since 40.  His daughter is an Therapist, sports, lives in [place], checks in via phone and visits 2-3 times a month.  He moved to [place] one year ago, but feels neighbors are supportive.  He also spoke about having a church family that is engaged.  No concerns reported regarding sleep or appetite.  No history of depression or anxiety reported.    Expected Outcomes  Short term goals: strengthen heart; long term goals: increase and maintain exercise to not have another heart attack    Continue Psychosocial Services   Follow up required by staff       Psychosocial Re-Evaluation: Psychosocial Re-Evaluation    [place] Name 05/01/18 0840             Psychosocial Re-Evaluation   Current issues with  None Identified       Comments  Jason Velazquez is doing well.  He feels that he is in a good place mentally and sleeping well.  We will continue to check up on him.        Expected Outcomes  Short: Continue to walk at home.  Long: Continue to stay positive.        Interventions  Encouraged to attend Cardiac Rehabilitation for the exercise       Continue Psychosocial Services   Follow up required by staff          Psychosocial Discharge (Final Psychosocial Re-Evaluation): Psychosocial Re-Evaluation - 05/01/18 0840      Psychosocial Re-Evaluation   Current issues with  None Identified    Comments  Jason Velazquez is doing well.  He feels that he is in a good place mentally and sleeping well.  We will continue to check up on him.     Expected  Outcomes  Short: Continue to walk at home.  Long: Continue to stay positive.     Interventions  Encouraged to attend Cardiac Rehabilitation for the exercise    Continue Psychosocial Services   Follow up required by staff       Vocational Rehabilitation: Provide vocational rehab assistance to qualifying candidates.   Vocational Rehab Evaluation & Intervention: Vocational Rehab - 04/13/18 1304      Initial Vocational Rehab Evaluation & Intervention   Assessment shows need for Vocational Rehabilitation  No       Education: Education Goals: Education classes will be provided on a variety of topics geared toward better understanding of heart health and risk factor modification. Participant will state understanding/return demonstration of topics presented as noted by education test scores.  Learning Barriers/Preferences: Learning Barriers/Preferences - 04/13/18 1303      Learning Barriers/Preferences   Learning Barriers  Hearing   hearing aids   Learning Preferences  Individual Instruction;Verbal Instruction       Education Topics:  AED/CPR: - Group verbal and written instruction with the use of models to  demonstrate the basic use of the AED with the basic ABC's of resuscitation.   General Nutrition Guidelines/Fats and Fiber: -Group instruction provided by verbal, written material, models and posters to present the general guidelines for heart healthy nutrition. Gives an explanation and review of dietary fats and fiber.   Cardiac Rehab from 04/20/2018 in [facility] Cardiac and Pulmonary Rehab  Date  04/20/18  Educator  [facility]  Instruction Review Code  1- Verbalizes Understanding      Controlling Sodium/Reading Food Labels: -Group verbal and written material supporting the discussion of sodium use in heart healthy nutrition. Review and explanation with models, verbal and written materials for utilization of the food label.   Exercise Physiology & General Exercise Guidelines: - Group  verbal and written instruction with models to review the exercise physiology of the cardiovascular system and associated critical values. Provides general exercise guidelines with specific guidelines to those with heart or lung disease.    Aerobic Exercise & Resistance Training: - Gives group verbal and written instruction on the various components of exercise. Focuses on aerobic and resistive training programs and the benefits of this training and how to safely progress through these programs..   Flexibility, Balance, Mind/Body Relaxation: Provides group verbal/written instruction on the benefits of flexibility and balance training, including mind/body exercise modes such as yoga, pilates and tai chi.  Demonstration and skill practice provided.   Stress and Anxiety: - Provides group verbal and written instruction about the health risks of elevated stress and causes of high stress.  Discuss the correlation between heart/lung disease and anxiety and treatment options. Review healthy ways to manage with stress and anxiety.   Depression: - Provides group verbal and written instruction on the correlation between heart/lung disease and depressed mood, treatment options, and the stigmas associated with seeking treatment.   Anatomy & Physiology of the Heart: - Group verbal and written instruction and models provide basic cardiac anatomy and physiology, with the coronary electrical and arterial systems. Review of Valvular disease and Heart Failure   Cardiac Procedures: - Group verbal and written instruction to review commonly prescribed medications for heart disease. Reviews the medication, class of the drug, and side effects. Includes the steps to properly store meds and maintain the prescription regimen. (beta blockers and nitrates)   Cardiac Medications I: - Group verbal and written instruction to review commonly prescribed medications for heart disease. Reviews the medication, class of the  drug, and side effects. Includes the steps to properly store meds and maintain the prescription regimen.   Cardiac Medications II: -Group verbal and written instruction to review commonly prescribed medications for heart disease. Reviews the medication, class of the drug, and side effects. (all other drug classes)    Go Sex-Intimacy & Heart Disease, Get SMART - Goal Setting: - Group verbal and written instruction through game format to discuss heart disease and the return to sexual intimacy. Provides group verbal and written material to discuss and apply goal setting through the application of the S.M.A.R.T. Method.   Other Matters of the Heart: - Provides group verbal, written materials and models to describe Stable Angina and Peripheral Artery. Includes description of the disease process and treatment options available to the cardiac patient.   Exercise & Equipment Safety: - Individual verbal instruction and demonstration of equipment use and safety with use of the equipment.   Cardiac Rehab from 04/20/2018 in [facility]  Date  04/13/18  Educator  mc  Instruction Review Code  1- [employer]  Understanding      Infection Prevention: - Provides verbal and written material to individual with discussion of infection control including proper hand washing and proper equipment cleaning during exercise session.   Cardiac Rehab from 04/20/2018 in [facility]  Date  04/13/18  Educator  mc  Instruction Review Code  1- Verbalizes Understanding      Falls Prevention: - Provides verbal and written material to individual with discussion of falls prevention and safety.   Cardiac Rehab from 04/20/2018 in [facility] Cardiac and Pulmonary Rehab  Date  04/13/18  Educator  mc  Instruction Review Code  1- Verbalizes Understanding      Diabetes: - Individual verbal and written instruction to review signs/symptoms of diabetes, desired ranges of glucose level  fasting, after meals and with exercise. Acknowledge that pre and post exercise glucose checks will be done for 3 sessions at entry of program.   Know Your Numbers and Risk Factors: -Group verbal and written instruction about important numbers in your health.  Discussion of what are risk factors and how they play a role in the disease process.  Review of Cholesterol, Blood Pressure, Diabetes, and BMI and the role they play in your overall health.   Sleep Hygiene: -Provides group verbal and written instruction about how sleep can affect your health.  Define sleep hygiene, discuss sleep cycles and impact of sleep habits. Review good sleep hygiene tips.    Other: -Provides group and verbal instruction on various topics (see comments)   Knowledge Questionnaire Score: Knowledge Questionnaire Score - 04/13/18 1304      Knowledge Questionnaire Score   Pre Score  25/26   Correct response was reviewed with Elenore Rota today. He verbalized understanding of response and had no further questions today.      Core Components/Risk Factors/Patient Goals at Admission: Personal Goals and Risk Factors at Admission - 04/13/18 1304      Core Components/Risk Factors/Patient Goals on Admission    Weight Management  Yes;Weight Loss    Intervention  Weight Management: Develop a combined nutrition and exercise program designed to reach desired caloric intake, while maintaining appropriate intake of nutrient and fiber, sodium and fats, and appropriate energy expenditure required for the weight goal.;Weight Management/Obesity: Establish reasonable short term and long term weight goals.    Admit Weight  181 lb 14.4 oz (82.5 kg)    Goal Weight: Short Term  179 lb (81.2 kg)    Goal Weight: Long Term  160 lb (72.6 kg)    Expected Outcomes  Short Term: Continue to assess and modify interventions until short term weight is achieved;Long Term: Adherence to nutrition and physical activity/exercise program aimed toward  attainment of established weight goal;Weight Loss: Understanding of general recommendations for a balanced deficit meal plan, which promotes 1-2 lb weight loss per week and includes a negative energy balance of [phone] kcal/d    Hypertension  Yes    Intervention  Provide education on lifestyle modifcations including regular physical activity/exercise, weight management, moderate sodium restriction and increased consumption of fresh fruit, vegetables, and low fat dairy, alcohol moderation, and smoking cessation.;Monitor prescription use compliance.    Expected Outcomes  Short Term: Continued assessment and intervention until BP is < 140/53m HG in hypertensive participants. < 130/873mHG in hypertensive participants with diabetes, heart failure or chronic kidney disease.;Long Term: Maintenance of blood pressure at goal levels.    Lipids  Yes    Intervention  Provide education and support for participant on nutrition &  aerobic/resistive exercise along with prescribed medications to achieve LDL <27m, HDL >464m    Expected Outcomes  Short Term: Participant states understanding of desired cholesterol values and is compliant with medications prescribed. Participant is following exercise prescription and nutrition guidelines.;Long Term: Cholesterol controlled with medications as prescribed, with individualized exercise RX and with personalized nutrition plan. Value goals: LDL < 7037mHDL > 40 mg.       Core Components/Risk Factors/Patient Goals Review:  Goals and Risk Factor Review    Row Name 05/01/18 [id number]          Core Components/Risk Factors/Patient Goals Review   Personal Goals Review  Weight Management/Obesity;Hypertension;Lipids       Review  DonTimmothy Sourss been doing well at home. He started walking today.  His weight is holding steady.  He would like to lose 10 lbs.  He has not been checking his blood pressures but will start.  He is doing well on his medications.        Expected Outcomes   Short: Start to check his blood pressures Long: Continue to work on weight loss.           Core Components/Risk Factors/Patient Goals at Discharge (Final Review):  Goals and Risk Factor Review - 05/01/18 0858      Core Components/Risk Factors/Patient Goals Review   Personal Goals Review  Weight Management/Obesity;Hypertension;Lipids    Review  DonTimmothy Sourss been doing well at home. He started walking today.  His weight is holding steady.  He would like to lose 10 lbs.  He has not been checking his blood pressures but will start.  He is doing well on his medications.     Expected Outcomes  Short: Start to check his blood pressures Long: Continue to work on weight loss.        ITP Comments: ITP Comments    Row Name 04/13/18 1254 04/28/18 1602 05/06/18 1147       ITP Comments  Medical review completed today. ITP sent to Dr M MLoleta Chancer review, changes as needed and signature.  [facility]  Our program is currently closed due to COVID-19.  We are communicating with patient via phone calls and emails.    30 day review. Continue with ITP unless directed changes by Medical Director chart review.        Comments:

## 2018-05-26 ENCOUNTER — Encounter: Payer: Self-pay | Admitting: *Deleted

## 2018-05-27 ENCOUNTER — Telehealth: Payer: Self-pay | Admitting: *Deleted

## 2018-05-27 NOTE — Telephone Encounter (Signed)
Virtual Visit Pre-Appointment Phone Call  Steps For Call:  Confirm consent - "In the setting of the current Covid19 crisis, you are scheduled for a (TELEPHONE) visit with your provider on (06/01/18) at (1:30PM).  Just as we do with many in-office visits, in order for you to participate in this visit, we must obtain consent.  If you'd like, I can send this to your mychart (if signed up) or email for you to review.  Otherwise, I can obtain your verbal consent now.  All virtual visits are billed to your insurance company just like a normal visit would be.  By agreeing to a virtual visit, we'd like you to understand that the technology does not allow for your provider to perform an examination, and thus may limit your provider's ability to fully assess your condition.  Finally, though the technology is pretty good, we cannot assure that it will always work on either your or our end, and in the setting of a video visit, we may have to convert it to a phone-only visit.  In either situation, we cannot ensure that we have a secure connection.  Are you willing to proceed?" YES 1. Confirm the BEST phone number to call the day of the visit by including in appointment notes  2. Give patient instructions for WebEx/MyChart download to smartphone as below or Doximity/Doxy.me if video visit (depending on what platform provider is using)  3. Advise patient to be prepared with their blood pressure, heart rate, weight, any heart rhythm information, their current medicines, and a piece of paper and pen handy for any instructions they may receive the day of their visit  4. Inform patient they will receive a phone call 15 minutes prior to their appointment time (may be from unknown caller ID) so they should be prepared to answer  5. Confirm that appointment type is correct in Epic appointment notes (VIDEO vs PHONE)     TELEPHONE CALL NOTE  Jason Velazquez has been deemed a candidate for a follow-up tele-health  visit to limit community exposure during the Covid-19 pandemic. I spoke with the patient via phone to ensure availability of phone/video source, confirm preferred email & phone number, and discuss instructions and expectations.  I reminded Jason Velazquez to be prepared with any vital sign and/or heart rhythm information that could potentially be obtained via home monitoring, at the time of his visit. I reminded Jason Velazquez to expect a phone call at the time of his visit if his visit.  Britt Bottom, CMA 05/27/2018 4:50 PM   INSTRUCTIONS FOR DOWNLOADING THE [facility] APP TO SMARTPHONE  - If Apple, ask patient to go to [employer] and type in WebEx in the search bar. [employer], the blue/green circle. If Android, go to [employer] and type in [employer] in the search bar. The app is free but as with any other app downloads, their phone may require them to verify saved payment information or Apple/Android password.  - The patient does NOT have to create an account. - On the day of the visit, the assist will walk the patient through joining the meeting with the meeting number/password.  INSTRUCTIONS FOR DOWNLOADING THE MYCHART APP TO SMARTPHONE  - The patient must first make sure to have activated MyChart and know their login information - If Apple, go to [employer] and type in MyChart in the search bar and download the app. If Android, ask patient to go to [employer]  and type in MyChart in the search bar and download the app. The app is free but as with any other app downloads, their phone may require them to verify saved payment information or Apple/Android password.  - The patient will need to then log into the app with their MyChart username and password, and select [facility] as their healthcare provider to link the account. When it is time for your visit, go to the MyChart app, find appointments, and click Begin Video Visit. Be sure to Select Allow for your device to access  the Microphone and Camera for your visit. You will then be connected, and your provider will be with you shortly.  **If they have any issues connecting, or need assistance please contact MyChart service desk [phone])**  **If using a computer, in order to ensure the best quality for their visit they will need to use either of the following Internet Browsers: [employer], or Google Chrome**  IF USING DOXIMITY or DOXY.ME - The patient will receive a link just prior to their visit, either by text or email (to be determined day of appointment depending on if it's doxy.me or Doximity).     FULL LENGTH CONSENT FOR TELE-HEALTH VISIT   I hereby voluntarily request, consent and authorize [facility] and its employed or contracted physicians, physician assistants, nurse practitioners or other licensed health care professionals (the Practitioner), to provide me with telemedicine health care services (the Services") as deemed necessary by the treating Practitioner. I acknowledge and consent to receive the Services by the Practitioner via telemedicine. I understand that the telemedicine visit will involve communicating with the Practitioner through live audiovisual communication technology and the disclosure of certain medical information by electronic transmission. I acknowledge that I have been given the opportunity to request an in-person assessment or other available alternative prior to the telemedicine visit and am voluntarily participating in the telemedicine visit.  I understand that I have the right to withhold or withdraw my consent to the use of telemedicine in the course of my care at any time, without affecting my right to future care or treatment, and that the Practitioner or I may terminate the telemedicine visit at any time. I understand that I have the right to inspect all information obtained and/or recorded in the course of the telemedicine visit and may receive copies of  available information for a reasonable fee.  I understand that some of the potential risks of receiving the Services via telemedicine include:   Delay or interruption in medical evaluation due to technological equipment failure or disruption;  Information transmitted may not be sufficient (e.g. poor resolution of images) to allow for appropriate medical decision making by the Practitioner; and/or   In rare instances, security protocols could fail, causing a breach of personal health information.  Furthermore, I acknowledge that it is my responsibility to provide information about my medical history, conditions and care that is complete and accurate to the best of my ability. I acknowledge that Practitioner's advice, recommendations, and/or decision may be based on factors not within their control, such as incomplete or inaccurate data provided by me or distortions of diagnostic images or specimens that may result from electronic transmissions. I understand that the practice of medicine is not an exact science and that Practitioner makes no warranties or guarantees regarding treatment outcomes. I acknowledge that I will receive a copy of this consent concurrently upon execution via email to the email address I last provided but may also request  a printed copy by calling the office of [facility].    I understand that my insurance will be billed for this visit.   I have read or had this consent read to me.  I understand the contents of this consent, which adequately explains the benefits and risks of the Services being provided via telemedicine.   I have been provided ample opportunity to ask questions regarding this consent and the Services and have had my questions answered to my satisfaction.  I give my informed consent for the services to be provided through the use of telemedicine in my medical care  By participating in this telemedicine visit I agree to the above.

## 2018-05-30 NOTE — Progress Notes (Signed)
Virtual Visit via Telephone Note   This visit type was conducted due to national recommendations for restrictions regarding the COVID-19 Pandemic (e.g. social distancing) in an effort to limit this patient's exposure and mitigate transmission in our community.  Due to his co-morbid illnesses, this patient is at least at moderate risk for complications without adequate follow up.  This format is felt to be most appropriate for this patient at this time.  The patient did not have access to video technology/had technical difficulties with video requiring transitioning to audio format only (telephone).  All issues noted in this document were discussed and addressed.  No physical exam could be performed with this format.  Please refer to the patient's chart for his  consent to telehealth for [facility].   Evaluation Performed:  Follow-up visit  Date:  06/01/2018   ID:  Jason Velazquez, DOB [birth year], MRN [id number]  Patient Location: Home Provider Location: Home  PCP:  Tonia Ghent, MD  Cardiologist:  Nelva Bush, MD  Electrophysiologist:  None   Chief Complaint: Follow-up coronary artery disease  History of Present Illness:    Jason Velazquez is a 85 y.o. male with history of CAD s/p CABG (1998; LIMA-LAD, SVG-D1-D2, SVG-rPL) and subsequent PCI to proximal LCx, stroke with residual memory deficites, carotid artery stenosis, hypertension, hyperlipidemia, and BPH.  He was last seen in our office on 04/17/2018 by Christell Faith, PA, for hospital follow-up.  He had initially presented to [facility] on 03/21/2018 with chest pain and was found to have an elevated troponin consistent with NSTEMI.  Subsequent catheterization showed severe native CAD as well as likely acute/subacute occlusion of sequential SVG to the diagonal branches.  Given that his chest pain had resolved, decision was made to manage his NSTEMI medically.  He and his daughter reported possible episodes of very brief chest pain since being  discharged, though underlying dementia made evaluation difficult.  Isosorbide mononitrate was therefore increased from 15 mg daily to 30 mg daily.  Today, Mr. Whang reports that he is feeling well.  He has not had any further episodes of chest pain.  He also denies shortness of breath, palpitations, and lightheadedness.  He has chronic dependent edema in his ankles, which is most noticeable in the evenings.  It typically has resolved by the next morning.  He notes that his blood pressure has been high since leaving the hospital, similar to today's reading.  He is tolerating his current medications well, including the increased dose of Imdur.  He is walking 2 miles per day, 6 days/week.  He is also trying to eat healthy, though he frequently eats salty foods such as chips and popcorn.  The patient does not have symptoms concerning for COVID-19 infection (fever, chills, cough, or new shortness of breath).    Past Medical History:  Diagnosis Date   Allergic rhinitis    Benign prostatic hypertrophy    Coronary artery disease CARDIOLOGIST -- DR Daneen Schick   Duodenal diverticulum 2003   GERD (gastroesophageal reflux disease)    H/O hiatal hernia 2003   Heart murmur    Hepatic lesion 2004   History of melanoma excision    FACE   Hypercholesteremia    Hypertension    Left carotid stenosis    > 50%  PER DUPLEX  03-28-2011   Mild mitral regurgitation    Perianal fistula    S/P CABG x 4    1988   S/P drug eluting coronary stent placement  POST CABG--  STENTING 2000  AND RESTENTING 2006 FOR REINSTENT STENOSIS   Stroke [facility])    Past Surgical History:  Procedure Laterality Date   CATARACT EXTRACTION W/ INTRAOCULAR LENS  IMPLANT, BILATERAL     CORONARY ANGIOPLASTY WITH STENT PLACEMENT  2000   PCI AND STENTING CIRCUMFLEX   CORONARY ANGIOPLASTY WITH STENT PLACEMENT  04-30-2004  DR Daneen Schick   RE-INSTENT STENOSIS/  DRUG-ELUTING STENT OF PROXIMAL AND MID CIRCUMFLEX/ PCI  MID [place] STRUT/  WIDELY PATENT SAPHENOUS VEIN GRAFT AND  LIMA TO LAD GRAFT/ TOTAL OCCLUSION RIGHT CORONARY BEYOND THE POSTERIOR DESCENDING ARTERY BRANCH WITH 50% OSTIAL NARROWING/ TOTAL OCCLUSION OF LAD AT THE OSTIUM OF THE LEFT MAIN   CORONARY ARTERY BYPASS GRAFT  1988   X5   EVALUATION UNDER ANESTHESIA WITH ANAL FISTULECTOMY N/A 07/22/2012   Procedure: EXAM UNDER ANESTHESIA WITH ANAL fistulotomy;  Surgeon: Leighton Ruff, MD;  Location: [place];  Service: General;  Laterality: N/A;   LEFT HEART CATH AND CORS/GRAFTS ANGIOGRAPHY N/A 03/31/2018   Procedure: LEFT HEART CATH AND CORS/GRAFTS ANGIOGRAPHY;  Surgeon: Nelva Bush, MD;  Location: [place] CV [facility];  Service: Cardiovascular;  Laterality: N/A;   SHOULDER ARTHROSCOPY WITH OPEN ROTATOR CUFF REPAIR Right 07-10-1999   TONSILLECTOMY AND ADENOIDECTOMY  1944   TRANSTHORACIC ECHOCARDIOGRAM  03-01-2011  DR Daneen Schick   NORMAL LVF AND LV SIZE/ MILD LEFT ATRIAL ENLARGEMENT/ GRADE II DIASTOLIC DYSFUNCTION WITH ELEVATED LEFT ATRIAL PRESSURE/ MILD TO MODERATE MR/ MILD TR   TRANSURETHRAL RESECTION OF PROSTATE  04/09/2011   Procedure: TRANSURETHRAL RESECTION OF THE PROSTATE WITH GYRUS INSTRUMENTS;  Surgeon: Malka So, MD;  Location: [facility];  Service: Urology;  Laterality: N/A;     Current Meds  Medication Sig   aspirin EC 81 MG EC tablet Take 1 tablet (81 mg total) by mouth daily.   atorvastatin (LIPITOR) 80 MG tablet Take 1 tablet (80 mg total) by mouth at bedtime.   clopidogrel (PLAVIX) 75 MG tablet Take 75 mg by mouth daily.   cyanocobalamin (,VITAMIN B-12,) 1000 MCG/ML injection Inject 1 mL (1,000 mcg total) into the muscle every 30 (thirty) days.   isosorbide mononitrate (IMDUR) 30 MG 24 hr tablet Take 1 tablet (30 mg total) by mouth daily.   ketoconazole (NIZORAL) 2 % cream Apply 1 application topically 2 (two) times daily.   loteprednol (LOTEMAX) 0.2 % SUSP 1 drop as needed.    metoprolol succinate (TOPROL-XL) 25 MG 24 hr tablet Take 1 tablet (25 mg total) by mouth daily. Take with or immediately following a meal.   nitroGLYCERIN (NITROSTAT) 0.4 MG SL tablet Place 1 tablet (0.4 mg total) under the tongue every 5 (five) minutes as needed for chest pain.   pantoprazole (PROTONIX) 40 MG tablet Take 1 tablet (40 mg total) by mouth daily.   polyethylene glycol (MIRALAX / GLYCOLAX) packet Take 17 g by mouth daily.     Allergies:   Adhesive [tape]; Iodine; and Niacin and related   Social History   Tobacco Use   Smoking status: Never Smoker   Smokeless tobacco: Never Used  Substance Use Topics   Alcohol use: No   Drug use: No     Family Hx: The patient's family history includes Breast cancer in his mother; Colon cancer in his brother; Stroke in his father.  ROS:   Please see the history of present illness.   All other systems reviewed and are negative.   Prior CV studies:   The  following studies were reviewed today:  TTE (03/31/2018): Normal LV size with moderate LVH.  LVEF 55-60% with mild anterolateral hypokinesis and grade 1 diastolic dysfunction.  Normal RV size and function.  Degenerative mitral valve disease.  Mild to moderate tricuspid regurgitation.  LHC (03/31/2018): 1. Severe three-vessel coronary artery disease including chronic total occlusion of the ostial and mid LAD, in-stent restenosis of overlapping proximal LCx stents complicated by stent fracture, sequential 80% OM2 stenoses, 80% jailed AV groove LCx stenosis, 70% proximal RCA lesion, sequential 60% and 70% ostial and distal RPDA stenoses and chronic total occlusion of the rPL. 2. Widely patent LIMA to LAD and SVG to rPL. 3. Occlusion of sequential SVG to diagonal branches.  I suspect this is the patient's culprit lesion with acute occlusion on chronic subtotal occlusion, given that collaterals have already developed. 4. Mildly to moderately reduced left ventricular systolic function (EF  16%) with mid anterior akinesis. 5. Mildly reduced left ventricular filling pressure.  Labs/Other Tests and Data Reviewed:    EKG:  No ECG reviewed.  Recent Labs: 08/25/2017: TSH 4.94 02/19/2018: ALT 20 04/01/2018: BUN 11; Creatinine, Ser 0.92; Potassium 4.3; Sodium 135 04/02/2018: Hemoglobin 12.6; Platelets 174   Recent Lipid Panel Lab Results  Component Value Date/Time   CHOL 137 03/31/2018 05:52 AM   CHOL 138 12/16/2012 08:37 AM   TRIG 64 03/31/2018 05:52 AM   HDL 42 03/31/2018 05:52 AM   HDL 46 12/16/2012 08:37 AM   CHOLHDL 3.3 03/31/2018 05:52 AM   LDLCALC 82 03/31/2018 05:52 AM   LDLCALC 75 12/16/2012 08:37 AM    Wt Readings from Last 3 Encounters:  06/01/18 175 lb (79.4 kg)  04/23/18 184 lb 7 oz (83.7 kg)  04/17/18 180 lb 12 oz (82 kg)     Objective:    Vital Signs:  BP (!) 163/81 (BP Location: Left Arm, Patient Position: Sitting, Cuff Size: Normal)    Pulse 74    Ht 5\' 4"  (1.626 m)    Wt 175 lb (79.4 kg)    BMI 30.04 kg/m    VITAL SIGNS:  reviewed  ASSESSMENT & PLAN:    Coronary artery disease with stable angina: Mr. Clink reports feeling well without any further chest pain.  However, it should be noted that it was his daughter who mentioned chest pain at his follow-up visit last month.  Nonetheless, it sounds like Mr. Paulino Door remains active without limitations.  We will continue current doses of metoprolol and isosorbide mononitrate.  Due to suboptimal blood pressure control, I will also start amlodipine 2.5 mg daily for blood pressure control.  Mr. Beever will need to complete at least 12 months of dual antiplatelet therapy with aspirin and clopidogrel following his NSTEMI in February.  We will defer intervention to his multivessel disease, given the lack of symptoms and age.  Hypertension: Blood pressure still suboptimally controlled despite escalation of isosorbide mononitrate at.  Her Wagnon was previously on amlodipine, though this was stopped out of concern  that it may be contributing to his dependent edema.  It sounds as though his edema has not been changed significantly with discontinuation of amlodipine.  We will therefore restart amlodipine 2.5 mg daily.  I have asked Mr. Paulino Door to check his blood pressure at least weekly and to let us know if it is consistently above 140/90 over the next month.  Hyperlipidemia: Continue high intensity statin therapy for goal LDL less than 70.  We will plan for repeat lipid panel when patient follows  up.  COVID-19 Education: The signs and symptoms of COVID-19 were discussed with the patient and how to seek care for testing (follow up with PCP or arrange E-visit).  The importance of social distancing was discussed today.  Time:   Today, I have spent 13 minutes with the patient with telehealth technology discussing the above problems.  An additional 10 minutes were spent reviewing the patient's chart and recent catheterization images, as well as documenting today's encounter.     Medication Adjustments/Labs and Tests Ordered: Current medicines are reviewed at length with the patient today.  Concerns regarding medicines are outlined above.   Tests Ordered: None.  Medication Changes: Meds ordered this encounter  Medications   amLODipine (NORVASC) 2.5 MG tablet    Sig: Take 1 tablet (2.5 mg total) by mouth daily.    Dispense:  90 tablet    Refill:  2    Disposition:  Follow up in 3 month(s)  Signed, Nelva Bush, MD  06/01/2018 2:23 PM    [facility]

## 2018-06-01 ENCOUNTER — Encounter: Payer: Self-pay | Admitting: Internal Medicine

## 2018-06-01 ENCOUNTER — Other Ambulatory Visit: Payer: Self-pay

## 2018-06-01 ENCOUNTER — Telehealth (INDEPENDENT_AMBULATORY_CARE_PROVIDER_SITE_OTHER): Payer: Medicare Other | Admitting: Internal Medicine

## 2018-06-01 ENCOUNTER — Telehealth: Payer: Self-pay | Admitting: *Deleted

## 2018-06-01 VITALS — BP 163/81 | HR 74 | Ht 64.0 in | Wt 175.0 lb

## 2018-06-01 DIAGNOSIS — E785 Hyperlipidemia, unspecified: Secondary | ICD-10-CM

## 2018-06-01 DIAGNOSIS — I10 Essential (primary) hypertension: Secondary | ICD-10-CM | POA: Diagnosis not present

## 2018-06-01 DIAGNOSIS — I25118 Atherosclerotic heart disease of native coronary artery with other forms of angina pectoris: Secondary | ICD-10-CM | POA: Diagnosis not present

## 2018-06-01 MED ORDER — AMLODIPINE BESYLATE 2.5 MG PO TABS: 2.5000 mg | ORAL_TABLET | Freq: Every day | ORAL | 2 refills | Status: DC

## 2018-06-01 NOTE — Patient Instructions (Signed)
Medication Instructions:  Your physician has recommended you make the following change in your medication:  1- START TAKING Amlodipine 2.5 mg (1 tablet) by mouth once a day.  If you need a refill on your cardiac medications before your next appointment, please call your pharmacy.   Lab work: none If you have labs (blood work) drawn today and your tests are completely normal, you will receive your results only by: Marland Kitchen MyChart Message (if you have MyChart) OR . A paper copy in the mail If you have any lab test that is abnormal or we need to change your treatment, we will call you to review the results.  Testing/Procedures: none  Follow-Up: At [facility], you and your health needs are our priority.  As part of our continuing mission to provide you with exceptional heart care, we have created designated Provider Care Teams.  These Care Teams include your primary Cardiologist (physician) and Advanced Practice Providers (APPs -  Physician Assistants and Nurse Practitioners) who all work together to provide you with the care you need, when you need it. You will need a follow up appointment in 3 months.  Please call our office 2 months in advance to schedule this appointment.  You may see Nelva Bush, MD or one of the following Advanced Practice Providers on your designated Care Team:   Murray Hodgkins, NP Christell Faith, PA-C . Marrianne Mood, PA-C  Any Other Special Instructions Will Be Listed Below (If Applicable).  Please monitor your blood pressure and heart rate at home.  In a month or two, let us know if it is running consistently greater than 140/90.

## 2018-06-01 NOTE — Telephone Encounter (Signed)
Spoke with patient regarding AVS from today's evisit and he verbalized understanding of everything on the AVS.

## 2018-06-17 ENCOUNTER — Ambulatory Visit: Payer: Medicare Other | Admitting: Internal Medicine

## 2018-06-23 ENCOUNTER — Encounter: Payer: Self-pay | Admitting: *Deleted

## 2018-06-23 DIAGNOSIS — I214 Non-ST elevation (NSTEMI) myocardial infarction: Secondary | ICD-10-CM

## 2018-06-25 ENCOUNTER — Other Ambulatory Visit: Payer: Self-pay | Admitting: Family Medicine

## 2018-06-26 NOTE — Telephone Encounter (Signed)
Per notes on Rx request . Medication was D/C. Please advise on refill request.

## 2018-06-28 NOTE — Telephone Encounter (Signed)
Should be on lipitor 80mg  per cardiology.  Please check to make sure patient still has that rx.   20mg  rx denied.  Thanks.

## 2018-07-07 ENCOUNTER — Other Ambulatory Visit: Payer: Self-pay | Admitting: *Deleted

## 2018-07-07 ENCOUNTER — Telehealth: Payer: Self-pay | Admitting: Family Medicine

## 2018-07-07 MED ORDER — AMLODIPINE BESYLATE 2.5 MG PO TABS: 2.5000 mg | ORAL_TABLET | Freq: Every day | ORAL | 0 refills | Status: DC

## 2018-07-07 MED ORDER — CLOPIDOGREL BISULFATE 75 MG PO TABS: 75.0000 mg | ORAL_TABLET | Freq: Every day | ORAL | 2 refills | Status: DC

## 2018-07-07 MED ORDER — ATORVASTATIN CALCIUM 80 MG PO TABS: 80.0000 mg | ORAL_TABLET | Freq: Every day | ORAL | 2 refills | Status: DC

## 2018-07-07 MED ORDER — METOPROLOL SUCCINATE ER 25 MG PO TB24: 25.0000 mg | ORAL_TABLET | Freq: Every day | ORAL | 0 refills | Status: DC

## 2018-07-07 MED ORDER — ISOSORBIDE MONONITRATE ER 30 MG PO TB24: 30.0000 mg | ORAL_TABLET | Freq: Every day | ORAL | 2 refills | Status: DC

## 2018-07-07 NOTE — Telephone Encounter (Signed)
Pt came into office and wanted to check on prescriptions being sent. The letter he had did not include the prescription name so he said he would call us back with the name of the prescriptions that need prior authorization.

## 2018-07-07 NOTE — Telephone Encounter (Signed)
Refills sent except for Atenolol which is not on patient's current meds list.  This refill request is sent to Dr. Damita Dunnings.

## 2018-07-07 NOTE — Telephone Encounter (Signed)
Patient phoned in for refills on medications.  Patient was scheduled for a doxy.me visit on 07/10/2018.  Patient asked for refills on: Amlodipine Metoprolol Isosorbide Clopidogrel Atorvastatin  (all of which were refilled for 3 months) However, patient also asked for refill on Atenolol which is not on the patient's current meds list.  Please advise.

## 2018-07-07 NOTE — Telephone Encounter (Signed)
Best number [phone]  Pt needs to get refills on   Atenolol Amlodipine Metoprolol Isosorbide mononitrate  clopidogrel Atorvastatin   [facility] [address] Pt schedule med refill up 5/29

## 2018-07-08 NOTE — Telephone Encounter (Signed)
Jacqlyn Larsen (daughter) notified as instructed by telephone.   She said that is correct. She is not sure why [name] asked for Atenolol.

## 2018-07-08 NOTE — Telephone Encounter (Signed)
I thought the atenolol had been replaced by metoprolol.  I would not take them both together.  I would continue with just metoprolol.  Thanks.

## 2018-07-10 ENCOUNTER — Ambulatory Visit (INDEPENDENT_AMBULATORY_CARE_PROVIDER_SITE_OTHER): Payer: Medicare Other | Admitting: Family Medicine

## 2018-07-10 DIAGNOSIS — I25810 Atherosclerosis of coronary artery bypass graft(s) without angina pectoris: Secondary | ICD-10-CM

## 2018-07-10 DIAGNOSIS — I255 Ischemic cardiomyopathy: Secondary | ICD-10-CM | POA: Diagnosis not present

## 2018-07-10 DIAGNOSIS — E538 Deficiency of other specified B group vitamins: Secondary | ICD-10-CM

## 2018-07-10 NOTE — Progress Notes (Signed)
Virtual visit completed through WebEx or similar program Patient location: home  Provider location: Financial controller at [facility], office   Pandemic considerations d/w pt.   Limitations and rationale for visit method d/w patient.  Patient agreed to proceed.   CC: f/u.   HPI:  CAD.  He walked 2 miles this AM.  He felt good doing it.  No CP or SOB with that.  No recent NTG use.  His BP has been controlled in the meantime.  BP has usually been ~120/80.  Some BLE edema in the ankles, but not tight on shoes or socks.    Prev memory changes d/w pt.  "Right now it seems to be good." Still on B12 replacement, last dose done yesterday with plan to recheck later this fall.  Oriented to year, month, date.  Normal attention, 3/3.  2/3 recall, 3/3 with prompting.  Normal math.    Meds and allergies reviewed.   ROS: Per HPI unless specifically indicated in ROS section   NAD Speech wnl  A/P:  History of B12 deficiency.  Prev memory changes d/w pt.  "Right now it seems to be good." Still on B12 replacement, last dose done yesterday with plan to recheck later this fall.  Oriented to year, month, date.  Normal attention, 3/3.  2/3 recall, 3/3 with prompting.  Normal math.  Would need recheck B12 in about 3 months, scheduled for 9AM on 10/28/2018.   CAD.  He reports being able to walk 2 miles this morning and feels good doing so.  No chest pain or shortness of breath with it.  No recent nitroglycerin use.  He has some mild ankle edema but his blood pressure has been controlled on home checks.  Would continue as is.  He'll call about f/u with cardiology.  D/w pt.

## 2018-07-12 NOTE — Assessment & Plan Note (Signed)
CAD.  He reports being able to walk 2 miles this morning and feels good doing so.  No chest pain or shortness of breath with it.  No recent nitroglycerin use.  He has some mild ankle edema but his blood pressure has been controlled on home checks.  Would continue as is.  He'll call about f/u with cardiology.  D/w pt.

## 2018-07-12 NOTE — Assessment & Plan Note (Signed)
History of B12 deficiency.  Prev memory changes d/w pt.  "Right now it seems to be good." Still on B12 replacement, last dose done yesterday with plan to recheck later this fall.  Oriented to year, month, date.  Normal attention, 3/3.  2/3 recall, 3/3 with prompting.  Normal math.  Would need recheck B12 in about 3 months, scheduled for 9AM on 10/28/2018.

## 2018-07-14 ENCOUNTER — Encounter: Payer: Self-pay | Admitting: *Deleted

## 2018-07-14 DIAGNOSIS — I214 Non-ST elevation (NSTEMI) myocardial infarction: Secondary | ICD-10-CM

## 2018-07-22 DIAGNOSIS — I214 Non-ST elevation (NSTEMI) myocardial infarction: Secondary | ICD-10-CM

## 2018-08-17 ENCOUNTER — Ambulatory Visit: Payer: Medicare Other

## 2018-08-17 ENCOUNTER — Encounter: Payer: Self-pay | Admitting: *Deleted

## 2018-08-17 DIAGNOSIS — I214 Non-ST elevation (NSTEMI) myocardial infarction: Secondary | ICD-10-CM

## 2018-08-17 NOTE — Progress Notes (Signed)
Discharge Progress Report  Patient Details  Name: Jason Velazquez MRN: [id number] Date of Birth: [birth year] Referring Provider:     Cardiac Rehab from 04/13/2018 in [facility]  Referring Provider  End, Christopher MD       Number of Visits: 5/36  Reason for Discharge:  Patient reached a stable level of exercise. Patient independent in their exercise. Early Exit:  Personal  Smoking History:  Social History   Tobacco Use  Smoking Status Never Smoker  Smokeless Tobacco Never Used    Diagnosis:  NSTEMI (non-ST elevated myocardial infarction) ([place])  ADL [facility]:   Initial Exercise Prescription: Initial Exercise Prescription - 04/13/18 1500      Date of Initial Exercise RX and Referring Provider   Date  04/13/18    Referring Provider  End, Christopher MD      Treadmill   MPH  1.4    Grade  0.5    Minutes  15    METs  2.17      NuStep   Level  1    SPM  80    Minutes  15    METs  2.1      Biostep-RELP   Level  1    SPM  50    Minutes  15    METs  2      Prescription Details   Frequency (times per week)  3    Duration  Progress to 45 minutes of aerobic exercise without signs/symptoms of physical distress      Intensity   THRR 40-80% of Max Heartrate  93-120    Ratings of Perceived Exertion  11-13    Perceived Dyspnea  0-4      Progression   Progression  Continue to progress workloads to maintain intensity without signs/symptoms of physical distress.      Resistance Training   Training Prescription  Yes    Weight  3 lbs    Reps  10-15       Discharge Exercise Prescription (Final Exercise Prescription Changes): Exercise Prescription Changes - 04/28/18 1600      Response to Exercise   Blood Pressure (Admit)  128/74    Blood Pressure (Exercise)  152/74    Blood Pressure (Exit)  120/70    Heart Rate (Admit)  67 bpm    Heart Rate (Exercise)  92 bpm    Heart Rate (Exit)  68 bpm    Rating of Perceived Exertion (Exercise)  14     Symptoms  none    Duration  Progress to 45 minutes of aerobic exercise without signs/symptoms of physical distress    Intensity  THRR unchanged      Progression   Progression  Continue to progress workloads to maintain intensity without signs/symptoms of physical distress.    Average METs  2.32      Resistance Training   Training Prescription  Yes    Weight  3 lbs    Reps  10-15      Interval Training   Interval Training  No      Treadmill   MPH  1.4    Grade  0.5    Minutes  15    METs  2.17      NuStep   Level  1    Minutes  15    METs  2.8      Biostep-RELP   Level  1    Minutes  15    METs  2      Home Exercise Plan   Plans to continue exercise at  Home (comment)   walking, videos   Frequency  Add 2 additional days to program exercise sessions.    Initial Home Exercises Provided  04/28/18   info mailed out      Functional Capacity: 6 Minute Walk    Row Name 04/13/18 1504         6 Minute Walk   Phase  Initial     Distance  1268 feet     Walk Time  6 minutes     # of Rest Breaks  0     MPH  2.4     METS  2.09     RPE  13     VO2 Peak  7.31     Symptoms  No     Resting HR  67 bpm     Resting BP  132/70     Resting Oxygen Saturation   99 %     Exercise Oxygen Saturation  during 6 min walk  99 %     Max Ex. HR  111 bpm     Max Ex. BP  158/64     2 Minute Post BP  130/70        Psychological, QOL, Others - Outcomes: PHQ 2/9: Depression screen [facility] 2/9 04/13/2018 02/19/2018 02/18/2017 11/02/2015  Decreased Interest 0 0 0 0  Down, Depressed, Hopeless 0 0 0 1  PHQ - 2 Score 0 0 0 1  Altered sleeping 0 0 0 -  Tired, decreased energy 0 0 0 -  Change in appetite 0 0 0 -  Feeling bad or failure about yourself  0 0 0 -  Trouble concentrating 0 0 0 -  Moving slowly or fidgety/restless 0 0 0 -  Suicidal thoughts 0 0 0 -  PHQ-9 Score 0 0 0 -  Difficult doing work/chores Not difficult at all Not difficult at all Not difficult at all -    Quality of  Life: Quality of Life - 04/13/18 1301      Quality of Life   Select  Quality of Life      Quality of Life Scores   Health/Function Pre  27.29 %    Socioeconomic Pre  24 %    Psych/Spiritual Pre  29.29 %    Family Pre  30 %    GLOBAL Pre  27.59 %       Personal Goals: Goals established at orientation with interventions provided to work toward goal. Personal Goals and Risk Factors at Admission - 04/13/18 1304      Core Components/Risk Factors/Patient Goals on Admission    Weight Management  Yes;Weight Loss    Intervention  Weight Management: Develop a combined nutrition and exercise program designed to reach desired caloric intake, while maintaining appropriate intake of nutrient and fiber, sodium and fats, and appropriate energy expenditure required for the weight goal.;Weight Management/Obesity: Establish reasonable short term and long term weight goals.    Admit Weight  181 lb 14.4 oz (82.5 kg)    Goal Weight: Short Term  179 lb (81.2 kg)    Goal Weight: Long Term  160 lb (72.6 kg)    Expected Outcomes  Short Term: Continue to assess and modify interventions until short term weight is achieved;Long Term: Adherence to nutrition and physical activity/exercise program aimed toward attainment of established weight goal;Weight Loss: Understanding of general recommendations for a balanced deficit meal  plan, which promotes 1-2 lb weight loss per week and includes a negative energy balance of [phone] kcal/d    Hypertension  Yes    Intervention  Provide education on lifestyle modifcations including regular physical activity/exercise, weight management, moderate sodium restriction and increased consumption of fresh fruit, vegetables, and low fat dairy, alcohol moderation, and smoking cessation.;Monitor prescription use compliance.    Expected Outcomes  Short Term: Continued assessment and intervention until BP is < 140/49mm HG in hypertensive participants. < 130/5mm HG in hypertensive  participants with diabetes, heart failure or chronic kidney disease.;Long Term: Maintenance of blood pressure at goal levels.    Lipids  Yes    Intervention  Provide education and support for participant on nutrition & aerobic/resistive exercise along with prescribed medications to achieve LDL 70mg , HDL >40mg .    Expected Outcomes  Short Term: Participant states understanding of desired cholesterol values and is compliant with medications prescribed. Participant is following exercise prescription and nutrition guidelines.;Long Term: Cholesterol controlled with medications as prescribed, with individualized exercise RX and with personalized nutrition plan. Value goals: LDL < 70mg , HDL > 40 mg.        Personal Goals Discharge: Goals and Risk Factor Review    Row Name 05/01/18 0858 05/26/18 1037 06/23/18 1104 07/14/18 1449       Core Components/Risk Factors/Patient Goals Review   Personal Goals Review  Weight Management/Obesity;Hypertension;Lipids  Weight Management/Obesity;Hypertension;Lipids  Weight Management/Obesity;Hypertension;Lipids  Weight Management/Obesity;Hypertension;Lipids    Review  Jason Velazquez has been doing well at home. He started walking today.  His weight is holding steady.  He would like to lose 10 lbs.  He has not been checking his blood pressures but will start.  He is doing well on his medications.   Jason Velazquez continues to do well at home.  He checks his weight daily and it stays pretty steady, but he really would like to lose more weight. He has spoken with Melissa but does not remember what to do.  I reviewed the notes with him, but also requested if Melissa could reach back out to him as well.  He still does not check his blood pressures as he is not worried about them and figures if they get too high then he would notice.  He just assumes that all is well.   Jason Velazquez's weight has been steady. His pressures are good. He had follow up visit a couple weeks ago and they restarted his amilodipine.  He  is tolerating this well.   Jason Velazquez's weight is staying the same.  He continues to have good pressures.  He was concern that his pressures were higher in the afternoon.  He had a follow up wiht his doctor and they were not concerned.  According to the notes, they were running around 120-130s/60-70s.      Expected Outcomes  Short: Start to check his blood pressures Long: Continue to work on weight loss.   Short: Continue to work on weight loss.  Long: Work on diet and exercise to help aid weight loss.  Short: Continue to work on weight loss  Long: Continue to monitor pressures.   Short: Continue to keep eye on blood pressures.  Long: Continue to work on weight loss.        Exercise Goals and Review: Exercise Goals    Row Name 04/13/18 1506             Exercise Goals   Increase Physical Activity  Yes       Intervention  Provide advice, education, support and counseling about physical activity/exercise needs.;Develop an individualized exercise prescription for aerobic and resistive training based on initial evaluation findings, risk stratification, comorbidities and participant's personal goals.       Expected Outcomes  Short Term: Attend rehab on a regular basis to increase amount of physical activity.;Long Term: Add in home exercise to make exercise part of routine and to increase amount of physical activity.;Long Term: Exercising regularly at least 3-5 days a week.       Increase Strength and Stamina  Yes       Intervention  Provide advice, education, support and counseling about physical activity/exercise needs.;Develop an individualized exercise prescription for aerobic and resistive training based on initial evaluation findings, risk stratification, comorbidities and participant's personal goals.       Expected Outcomes  Short Term: Increase workloads from initial exercise prescription for resistance, speed, and METs.;Short Term: Perform resistance training exercises routinely during rehab and add  in resistance training at home;Long Term: Improve cardiorespiratory fitness, muscular endurance and strength as measured by increased METs and functional capacity (6MWT)       Able to understand and use rate of perceived exertion (RPE) scale  Yes       Intervention  Provide education and explanation on how to use RPE scale       Expected Outcomes  Short Term: Able to use RPE daily in rehab to express subjective intensity level;Long Term:  Able to use RPE to guide intensity level when exercising independently       Knowledge and understanding of Target Heart Rate Range (THRR)  Yes       Intervention  Provide education and explanation of THRR including how the numbers were predicted and where they are located for reference       Expected Outcomes  Short Term: Able to state/look up THRR;Short Term: Able to use daily as guideline for intensity in rehab;Long Term: Able to use THRR to govern intensity when exercising independently       Able to check pulse independently  Yes       Intervention  Provide education and demonstration on how to check pulse in carotid and radial arteries.;Review the importance of being able to check your own pulse for safety during independent exercise       Expected Outcomes  Short Term: Able to explain why pulse checking is important during independent exercise;Long Term: Able to check pulse independently and accurately       Understanding of Exercise Prescription  Yes       Intervention  Provide education, explanation, and written materials on patient's individual exercise prescription       Expected Outcomes  Short Term: Able to explain program exercise prescription;Long Term: Able to explain home exercise prescription to exercise independently          Exercise Goals Re-Evaluation: Exercise Goals Re-Evaluation    Row Name 04/17/18 0744 04/28/18 1602 05/01/18 0836 05/26/18 1045 06/23/18 1049     Exercise Goal Re-Evaluation   Exercise Goals Review  Increase Physical  Activity;Increase Strength and Stamina;Understanding of Exercise Prescription  Increase Physical Activity;Increase Strength and Stamina;Understanding of Exercise Prescription  Increase Physical Activity;Increase Strength and Stamina;Understanding of Exercise Prescription  Increase Physical Activity;Increase Strength and Stamina;Understanding of Exercise Prescription  Increase Physical Activity;Increase Strength and Stamina;Understanding of Exercise Prescription   Comments  Reviewed RPE scale, THR and program prescription with pt today.  Pt voiced understanding and was given a copy of goals to take  home.   Jason Velazquez is off to a good start in rehab. He has completed two full sessions of exercise.  We have mailed out his home exercise packet in order for him to continue to exercise at home.  We will follow up with phone calls and continue to track his progress.   Jason Velazquez has started to walk at home.  This morning he did 2.5 miles in 47 min.  He does not have an email and does not do computers so he does not have access to our emails or videos.  I reviewed the home exercise guidelines with him over the phone.  I sent the packet in mail and told him to let me know if he has questions.   Jason Velazquez has been walking every day.  [place] [facility]  He likes that it gets him out of the house. However, he misses getting out to come to class and he enjoys being with others when he exercises.  He is eager to get back to class.  He does not use email and is limited with his phone use.  He is doing his stretches and some weights.  Jason Velazquez is doing well with his exericse at home.  He walked 2.5 miles today!  He enjoys his walking and getting out of the house.  He has continued to stretch and do some weights as well.  He is looking forward to returning to the program.   Expected Outcomes  Short: Use RPE daily to regulate intensity. Long: Follow program prescription in THR.  Short: Continue to exercise by  walking at home.  Long: Continue to increase strength and stamina.   Short: Continue to walk at home.  Long: Continue to increase strength and stamina.   Short: Continue to walk daily.  Long: Continue to increase stamina.   Short: Continue to walk daily.  Long: Continue to maintain activity.    [place] Name 07/14/18 1413             Exercise Goal Re-Evaluation   Exercise Goals Review  Increase Physical Activity;Increase Strength and Stamina;Understanding of Exercise Prescription       Comments  Jason Velazquez is walking still.  He is doing 2.5-3 miles six days a week.  He is still eager in getting back to class.        Expected Outcomes  Short: Continue to walk daily.  Long: Continue to maintain activity.           Nutrition & Weight - Outcomes: Pre Biometrics - 04/13/18 1507      Pre Biometrics   Height  5' 5.5" (1.664 m)    Weight  181 lb 14.4 oz (82.5 kg)    Waist Circumference  41 inches    Hip Circumference  42.5 inches    Waist to Hip Ratio  0.96 %    BMI (Calculated)  29.8    Single Leg Stand  6.85 seconds        Nutrition: Nutrition Therapy & Goals - 05/07/18 1234      Nutrition Therapy   Diet  Heart Healthy Low Sodium 1700-1800kcal (BMI: 31.64)(wst: 41inches!, ref<40inches)     Protein (specify units)  65-75g    Fiber  30 grams    Whole Grain Foods  3 servings    Saturated Fats  12 max. grams    Fruits and Vegetables  5 servings/day    Sodium  1.5 grams  Personal Nutrition Goals   Nutrition Goal  ST: Navigate healthy eating LT: lose about 10 lbs, increased energy    Comments  Pt lives in a retirement [place]. Pt eats water w/ miralax, danin light and fit yogurt (1/2), with fruit. B: instant oatmeal or grits. L: community cafeteria (protein and veggies) this week roasted rib w/ veggies - will eat half and save the rest for tomorrow. D: cup of fortified almond milk, will sometimes have cornbread or teriaki chicken. Discussed MyPlate and healthy recommendations.        Intervention Plan   Intervention  Prescribe, educate and counsel regarding individualized specific dietary modifications aiming towards targeted core components such as weight, hypertension, lipid management, diabetes, heart failure and other comorbidities.    Expected Outcomes  Short Term Goal: Understand basic principles of dietary content, such as calories, fat, sodium, cholesterol and nutrients.;Short Term Goal: A plan has been developed with personal nutrition goals set during dietitian appointment.;Long Term Goal: Adherence to prescribed nutrition plan.       Nutrition Discharge: Nutrition Assessments - 04/13/18 1302      MEDFICTS Scores   Pre Score  52       Education Questionnaire Score: Knowledge Questionnaire Score - 04/13/18 1304      Knowledge Questionnaire Score   Pre Score  25/26   Correct response was reviewed with Elenore Rota today. He verbalized understanding of response and had no further questions today.      Goals reviewed with patient; copy given to patient.

## 2018-08-17 NOTE — Progress Notes (Signed)
Cardiac Individual Treatment Plan  Patient Details  Name: Jason Velazquez MRN: [id number] Date of Birth: 1928/11/14 Referring Provider:     Cardiac Rehab from 04/13/2018 in [facility] Cardiac and Pulmonary Rehab  Referring Provider  End, Harrell Gave MD      Initial Encounter Date:    Cardiac Rehab from 04/13/2018 in [facility] Cardiac and Pulmonary Rehab  Date  04/13/18      Visit Diagnosis: NSTEMI (non-ST elevated myocardial infarction) [facility])  Patient's Home Medications on Admission:  Current Outpatient Medications:  .  amLODipine (NORVASC) 2.5 MG tablet, Take 1 tablet (2.5 mg total) by mouth daily., Disp: 90 tablet, Rfl: 0 .  aspirin EC 81 MG EC tablet, Take 1 tablet (81 mg total) by mouth daily., Disp: 30 tablet, Rfl: 0 .  atorvastatin (LIPITOR) 80 MG tablet, Take 1 tablet (80 mg total) by mouth at bedtime., Disp: 30 tablet, Rfl: 2 .  clopidogrel (PLAVIX) 75 MG tablet, Take 1 tablet (75 mg total) by mouth daily., Disp: 30 tablet, Rfl: 2 .  cyanocobalamin (,VITAMIN B-12,) 1000 MCG/ML injection, Inject 1 mL (1,000 mcg total) into the muscle every 30 (thirty) days., Disp: 1 mL, Rfl: 12 .  isosorbide mononitrate (IMDUR) 30 MG 24 hr tablet, Take 1 tablet (30 mg total) by mouth daily., Disp: 30 tablet, Rfl: 2 .  ketoconazole (NIZORAL) 2 % cream, Apply 1 application topically 2 (two) times daily., Disp: 60 g, Rfl: 1 .  loteprednol (LOTEMAX) 0.2 % SUSP, 1 drop as needed., Disp: , Rfl:  .  metoprolol succinate (TOPROL-XL) 25 MG 24 hr tablet, Take 1 tablet (25 mg total) by mouth daily. Take with or immediately following a meal., Disp: 90 tablet, Rfl: 0 .  nitroGLYCERIN (NITROSTAT) 0.4 MG SL tablet, Place 1 tablet (0.4 mg total) under the tongue every 5 (five) minutes as needed for chest pain., Disp: 30 tablet, Rfl: 0 .  pantoprazole (PROTONIX) 40 MG tablet, Take 1 tablet (40 mg total) by mouth daily., Disp: 90 tablet, Rfl: 3 .  polyethylene glycol (MIRALAX / GLYCOLAX) packet, Take 17 g by mouth daily., Disp:  , Rfl:   Past Medical History: Past Medical History:  Diagnosis Date  . Allergic rhinitis   . Benign prostatic hypertrophy   . Coronary artery disease CARDIOLOGIST -- DR Daneen Schick  . Duodenal diverticulum 2003  . GERD (gastroesophageal reflux disease)   . H/O hiatal hernia 2003  . Heart murmur   . Hepatic lesion 2004  . History of melanoma excision    FACE  . Hypercholesteremia   . Hypertension   . Left carotid stenosis    > 50%  PER DUPLEX  03-28-2011  . Mild mitral regurgitation   . Perianal fistula   . S/P CABG x 4    1988  . S/P drug eluting coronary stent placement    POST CABG--  STENTING 2000  AND RESTENTING 2006 FOR REINSTENT STENOSIS  . Stroke [facility])     Tobacco Use: Social History   Tobacco Use  Smoking Status Never Smoker  Smokeless Tobacco Never Used    Labs: Recent Review Flowsheet Data    Labs for ITP Cardiac and Pulmonary Rehab Latest Ref Rng & Units 05/27/2013 03/15/2016 02/18/2017 02/19/2018 03/31/2018   Cholestrol 0 - 200 mg/dL 126 142 150 128 137   LDLCALC 0 - 99 mg/dL 69 79 75 65 82   HDL >40 mg/dL 40 42.60 41.50 35.30(L) 42   Trlycerides <150 mg/dL 85 103.0 170.0(H) 136.0 64   Hemoglobin A1c <  5.7 % 5.8(H) - - - -       Exercise Target Goals: Exercise Program Goal: Individual exercise prescription set using results from initial 6 min walk test and THRR while considering  patient's activity barriers and safety.   Exercise Prescription Goal: Initial exercise prescription builds to 30-45 minutes a day of aerobic activity, 2-3 days per week.  Home exercise guidelines will be given to patient during program as part of exercise prescription that the participant will acknowledge.  Activity Barriers & Risk Stratification: Activity Barriers & Cardiac Risk Stratification - 04/13/18 1259      Activity Barriers & Cardiac Risk Stratification   Activity Barriers  Deconditioning;Muscular Weakness;Balance Concerns    Cardiac Risk Stratification  High        6 Minute Walk: 6 Minute Walk    Row Name 04/13/18 1504         6 Minute Walk   Phase  Initial     Distance  1268 feet     Walk Time  6 minutes     # of Rest Breaks  0     MPH  2.4     METS  2.09     RPE  13     VO2 Peak  7.31     Symptoms  No     Resting HR  67 bpm     Resting BP  132/70     Resting Oxygen Saturation   99 %     Exercise Oxygen Saturation  during 6 min walk  99 %     Max Ex. HR  111 bpm     Max Ex. BP  158/64     2 Minute Post BP  130/70        Oxygen Initial Assessment:   Oxygen Re-Evaluation:   Oxygen Discharge (Final Oxygen Re-Evaluation):   Initial Exercise Prescription: Initial Exercise Prescription - 04/13/18 1500      Date of Initial Exercise RX and Referring Provider   Date  04/13/18    Referring Provider  End, Christopher MD      Treadmill   MPH  1.4    Grade  0.5    Minutes  15    METs  2.17      NuStep   Level  1    SPM  80    Minutes  15    METs  2.1      Biostep-RELP   Level  1    SPM  50    Minutes  15    METs  2      Prescription Details   Frequency (times per week)  3    Duration  Progress to 45 minutes of aerobic exercise without signs/symptoms of physical distress      Intensity   THRR 40-80% of Max Heartrate  93-120    Ratings of Perceived Exertion  11-13    Perceived Dyspnea  0-4      Progression   Progression  Continue to progress workloads to maintain intensity without signs/symptoms of physical distress.      Resistance Training   Training Prescription  Yes    Weight  3 lbs    Reps  10-15       Perform Capillary Blood Glucose checks as needed.  Exercise Prescription Changes: Exercise Prescription Changes    Row Name 04/13/18 1200 04/28/18 1600           Response to Exercise   Blood Pressure (Admit)  132/70  128/74      Blood Pressure (Exercise)  158/64  152/74      Blood Pressure (Exit)  130/70  120/70      Heart Rate (Admit)  67 bpm  67 bpm      Heart Rate (Exercise)  111 bpm  92  bpm      Heart Rate (Exit)  70 bpm  68 bpm      Oxygen Saturation (Admit)  99 %  -      Oxygen Saturation (Exercise)  99 %  -      Rating of Perceived Exertion (Exercise)  13  14      Symptoms  none  none      Comments  walk test results  -      Duration  -  Progress to 45 minutes of aerobic exercise without signs/symptoms of physical distress      Intensity  -  THRR unchanged        Progression   Progression  -  Continue to progress workloads to maintain intensity without signs/symptoms of physical distress.      Average METs  -  2.32        Resistance Training   Training Prescription  -  Yes      Weight  -  3 lbs      Reps  -  10-15        Interval Training   Interval Training  -  No        Treadmill   MPH  -  1.4      Grade  -  0.5      Minutes  -  15      METs  -  2.17        NuStep   Level  -  1      Minutes  -  15      METs  -  2.8        Biostep-RELP   Level  -  1      Minutes  -  15      METs  -  2        Home Exercise Plan   Plans to continue exercise at  -  Home (comment) walking, videos      Frequency  -  Add 2 additional days to program exercise sessions.      Initial Home Exercises Provided  -  04/28/18 info mailed out         Exercise Comments: Exercise Comments    Row Name 04/17/18 [phone]           Exercise Comments  First full day of exercise!  Patient was oriented to gym and equipment including functions, settings, policies, and procedures.  Patient's individual exercise prescription and treatment plan were reviewed.  All starting workloads were established based on the results of the 6 minute walk test done at initial orientation visit.  The plan for exercise progression was also introduced and progression will be customized based on patient's performance and goals.          Exercise Goals and Review: Exercise Goals    Row Name 04/13/18 1506             Exercise Goals   Increase Physical Activity  Yes       Intervention  Provide  advice, education, support and counseling about physical activity/exercise needs.;Develop an individualized exercise prescription for aerobic and resistive  training based on initial evaluation findings, risk stratification, comorbidities and participant's personal goals.       Expected Outcomes  Short Term: Attend rehab on a regular basis to increase amount of physical activity.;Long Term: Add in home exercise to make exercise part of routine and to increase amount of physical activity.;Long Term: Exercising regularly at least 3-5 days a week.       Increase Strength and Stamina  Yes       Intervention  Provide advice, education, support and counseling about physical activity/exercise needs.;Develop an individualized exercise prescription for aerobic and resistive training based on initial evaluation findings, risk stratification, comorbidities and participant's personal goals.       Expected Outcomes  Short Term: Increase workloads from initial exercise prescription for resistance, speed, and METs.;Short Term: Perform resistance training exercises routinely during rehab and add in resistance training at home;Long Term: Improve cardiorespiratory fitness, muscular endurance and strength as measured by increased METs and functional capacity (6MWT)       Able to understand and use rate of perceived exertion (RPE) scale  Yes       Intervention  Provide education and explanation on how to use RPE scale       Expected Outcomes  Short Term: Able to use RPE daily in rehab to express subjective intensity level;Long Term:  Able to use RPE to guide intensity level when exercising independently       Knowledge and understanding of Target Heart Rate Range (THRR)  Yes       Intervention  Provide education and explanation of THRR including how the numbers were predicted and where they are located for reference       Expected Outcomes  Short Term: Able to state/look up THRR;Short Term: Able to use daily as guideline for  intensity in rehab;Long Term: Able to use THRR to govern intensity when exercising independently       Able to check pulse independently  Yes       Intervention  Provide education and demonstration on how to check pulse in carotid and radial arteries.;Review the importance of being able to check your own pulse for safety during independent exercise       Expected Outcomes  Short Term: Able to explain why pulse checking is important during independent exercise;Long Term: Able to check pulse independently and accurately       Understanding of Exercise Prescription  Yes       Intervention  Provide education, explanation, and written materials on patient's individual exercise prescription       Expected Outcomes  Short Term: Able to explain program exercise prescription;Long Term: Able to explain home exercise prescription to exercise independently          Exercise Goals Re-Evaluation : Exercise Goals Re-Evaluation    Row Name 04/17/18 0744 04/28/18 1602 05/01/18 0836 05/26/18 1045 06/23/18 1049     Exercise Goal Re-Evaluation   Exercise Goals Review  Increase Physical Activity;Increase Strength and Stamina;Understanding of Exercise Prescription  Increase Physical Activity;Increase Strength and Stamina;Understanding of Exercise Prescription  Increase Physical Activity;Increase Strength and Stamina;Understanding of Exercise Prescription  Increase Physical Activity;Increase Strength and Stamina;Understanding of Exercise Prescription  Increase Physical Activity;Increase Strength and Stamina;Understanding of Exercise Prescription   Comments  Reviewed RPE scale, THR and program prescription with pt today.  Pt voiced understanding and was given a copy of goals to take home.   Jason Velazquez is off to a good start in rehab. He has completed two full sessions  of exercise.  We have mailed out his home exercise packet in order for him to continue to exercise at home.  We will follow up with phone calls and continue to track  his progress.   Jason Velazquez has started to walk at home.  This morning he did 2.5 miles in 47 min.  He does not have an email and does not do computers so he does not have access to our emails or videos.  I reviewed the home exercise guidelines with him over the phone.  I sent the packet in mail and told him to let me know if he has questions.   Jason Velazquez has been walking every day.  [place] [facility]  He likes that it gets him out of the house. However, he misses getting out to come to class and he enjoys being with others when he exercises.  He is eager to get back to class.  He does not use email and is limited with his phone use.  He is doing his stretches and some weights.  Jason Velazquez is doing well with his exericse at home.  He walked 2.5 miles today!  He enjoys his walking and getting out of the house.  He has continued to stretch and do some weights as well.  He is looking forward to returning to the program.   Expected Outcomes  Short: Use RPE daily to regulate intensity. Long: Follow program prescription in THR.  Short: Continue to exercise by walking at home.  Long: Continue to increase strength and stamina.   Short: Continue to walk at home.  Long: Continue to increase strength and stamina.   Short: Continue to walk daily.  Long: Continue to increase stamina.   Short: Continue to walk daily.  Long: Continue to maintain activity.    [place] Name 07/14/18 1413             Exercise Goal Re-Evaluation   Exercise Goals Review  Increase Physical Activity;Increase Strength and Stamina;Understanding of Exercise Prescription       Comments  Jason Velazquez is walking still.  He is doing 2.5-3 miles six days a week.  He is still eager in getting back to class.        Expected Outcomes  Short: Continue to walk daily.  Long: Continue to maintain activity.           Discharge Exercise Prescription (Final Exercise Prescription Changes): Exercise Prescription Changes - 04/28/18 1600      Response  to Exercise   Blood Pressure (Admit)  128/74    Blood Pressure (Exercise)  152/74    Blood Pressure (Exit)  120/70    Heart Rate (Admit)  67 bpm    Heart Rate (Exercise)  92 bpm    Heart Rate (Exit)  68 bpm    Rating of Perceived Exertion (Exercise)  14    Symptoms  none    Duration  Progress to 45 minutes of aerobic exercise without signs/symptoms of physical distress    Intensity  THRR unchanged      Progression   Progression  Continue to progress workloads to maintain intensity without signs/symptoms of physical distress.    Average METs  2.32      Resistance Training   Training Prescription  Yes    Weight  3 lbs    Reps  10-15      Interval Training   Interval Training  No  Treadmill   MPH  1.4    Grade  0.5    Minutes  15    METs  2.17      NuStep   Level  1    Minutes  15    METs  2.8      Biostep-RELP   Level  1    Minutes  15    METs  2      Home Exercise Plan   Plans to continue exercise at  Home (comment)   walking, videos   Frequency  Add 2 additional days to program exercise sessions.    Initial Home Exercises Provided  04/28/18   info mailed out      Nutrition:  Target Goals: Understanding of nutrition guidelines, daily intake of sodium <1562m, cholesterol <2056m calories 30% from fat and 7% or less from saturated fats, daily to have 5 or more servings of fruits and vegetables.  Biometrics: Pre Biometrics - 04/13/18 1507      Pre Biometrics   Height  5' 5.5" (1.664 m)    Weight  181 lb 14.4 oz (82.5 kg)    Waist Circumference  41 inches    Hip Circumference  42.5 inches    Waist to Hip Ratio  0.96 %    BMI (Calculated)  29.8    Single Leg Stand  6.85 seconds        Nutrition Therapy Plan and Nutrition Goals: Nutrition Therapy & Goals - 05/07/18 1234      Nutrition Therapy   Diet  Heart Healthy Low Sodium 1700-1800kcal (BMI: 31.64)(wst: 41inches!, ref<40inches)     Protein (specify units)  65-75g    Fiber  30 grams    Whole  Grain Foods  3 servings    Saturated Fats  12 max. grams    Fruits and Vegetables  5 servings/day    Sodium  1.5 grams      Personal Nutrition Goals   Nutrition Goal  ST: Navigate healthy eating LT: lose about 10 lbs, increased energy    Comments  [place] w/ miralax, danin light and fit yogurt (1/2), with fruit. B: instant oatmeal or grits. L: community cafeteria (protein and veggies) this week roasted rib w/ veggies - will eat half and save the rest for tomorrow. D: cup of fortified almond milk, will sometimes have cornbread or teriaki chicken. Discussed MyPlate and healthy recommendations.       Intervention Plan   Intervention  Prescribe, educate and counsel regarding individualized specific dietary modifications aiming towards targeted core components such as weight, hypertension, lipid management, diabetes, heart failure and other comorbidities.    Expected Outcomes  Short Term Goal: Understand basic principles of dietary content, such as calories, fat, sodium, cholesterol and nutrients.;Short Term Goal: A plan has been developed with personal nutrition goals set during dietitian appointment.;Long Term Goal: Adherence to prescribed nutrition plan.       Nutrition Assessments: Nutrition Assessments - 04/13/18 1302      MEDFICTS Scores   Pre Score  52       Nutrition Goals Re-Evaluation: Nutrition Goals Re-Evaluation    [place] 07/22/18 1245             Goals   Nutrition Goal  ST: Navigate healthy eating LT: lose about 10 lbs, increased energy       Comment  walking no SOB or pain 4 miles according to fitbit this morning. Family has  been giving him food frozen meals mostly, not low sodium but have some veggies in each meal - 3x/week. B: oatmeal or grits every morning. L: will have frozen meals or campus lunch (will cut in half and eat half for dinner).  pt has limited options for meal so for time being will try to let family  know to try and get low sodium healthier frozen meals.        Expected Outcome  Eat as healthy as possible at [facility], continue to progress          Nutrition Goals Discharge (Final Nutrition Goals Re-Evaluation): Nutrition Goals Re-Evaluation - 07/22/18 1245      Goals   Nutrition Goal  ST: Navigate healthy eating LT: lose about 10 lbs, increased energy    Comment  walking no SOB or pain 4 miles according to fitbit this morning. Family has been giving him food frozen meals mostly, not low sodium but have some veggies in each meal - 3x/week. B: oatmeal or grits every morning. L: will have frozen meals or campus lunch (will cut in half and eat half for dinner).  pt has limited options for meal so for time being will try to let family know to try and get low sodium healthier frozen meals.     Expected Outcome  Eat as healthy as possible at [facility], continue to progress       Psychosocial: Target Goals: Acknowledge presence or absence of significant depression and/or stress, maximize coping skills, provide positive support system. Participant is able to verbalize types and ability to use techniques and skills needed for reducing stress and depression.   Initial Review & Psychosocial Screening: Initial Psych Review & Screening - 04/13/18 1300      Initial Review   Current issues with  None Identified      Family Dynamics   Good Support System?  Yes   Daughter Jacqlyn Larsen and Son Richardson Landry     Barriers   Psychosocial barriers to participate in program  There are no identifiable barriers or psychosocial needs.      Screening Interventions   Interventions  Encouraged to exercise;To provide support and resources with identified psychosocial needs;Provide feedback about the scores to participant    Expected Outcomes  Short Term goal: Utilizing psychosocial counselor, staff and physician to assist with identification of specific Stressors or current issues interfering with healing process. Setting  desired goal for each stressor or current issue identified.;Long Term Goal: Stressors or current issues are controlled or eliminated.;Short Term goal: Identification and review with participant of any Quality of Life or Depression concerns found by scoring the questionnaire.;Long Term goal: The participant improves quality of Life and PHQ9 Scores as seen by post scores and/or verbalization of changes       Quality of Life Scores:  Quality of Life - 04/13/18 1301      Quality of Life   Select  Quality of Life      Quality of Life Scores   Health/Function Pre  27.29 %    Socioeconomic Pre  24 %    Psych/Spiritual Pre  29.29 %    Family Pre  30 %    GLOBAL Pre  27.59 %      Scores of 19 and below usually indicate a poorer quality of life in these areas.  A difference of  2-3 points is a clinically meaningful difference.  A difference of 2-3 points in the total score of the Quality of  Life Index has been associated with significant improvement in overall quality of life, self-image, physical symptoms, and general health in studies assessing change in quality of life.  PHQ-9: Recent Review Flowsheet Data    Depression screen [facility] 2/9 04/13/2018 02/19/2018 02/18/2017 11/02/2015   Decreased Interest 0 0 0 0   Down, Depressed, Hopeless 0 0 0 1   PHQ - 2 Score 0 0 0 1   Altered sleeping 0 0 0 -   Tired, decreased energy 0 0 0 -   Change in appetite 0 0 0 -   Feeling bad or failure about yourself  0 0 0 -   Trouble concentrating 0 0 0 -   Moving slowly or fidgety/restless 0 0 0 -   Suicidal thoughts 0 0 0 -   PHQ-9 Score 0 0 0 -   Difficult doing work/chores Not difficult at all Not difficult at all Not difficult at all -     Interpretation of Total Score  Total Score Depression Severity:  1-4 = Minimal depression, 5-9 = Mild depression, 10-14 = Moderate depression, 15-19 = Moderately severe depression, 20-27 = Severe depression   Psychosocial Evaluation and Intervention: Psychosocial  Evaluation - 04/20/18 0947      Psychosocial Evaluation & Interventions   Interventions  Encouraged to exercise with the program and follow exercise prescription    Comments  K. Lavina Hamman counselor charting in [place]. Sommer's session: conversation regarding heart attack which led to patient's participation in program, this was 2nd heart attack.  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  Two spouses have died since 49.  His daughter is an Therapist, sports, lives in [place], checks in via phone and visits 2-3 times a month.  He moved to [facility] one year ago, but feels neighbors are supportive.  He also spoke about having a church family that is engaged.  No concerns reported regarding sleep or appetite.  No history of depression or anxiety reported.    Expected Outcomes  Short term goals: strengthen heart; long term goals: increase and maintain exercise to not have another heart attack    Continue Psychosocial Services   Follow up required by staff       Psychosocial Re-Evaluation: Psychosocial Re-Evaluation    [place] Name 05/01/18 0840 05/26/18 1036 06/23/18 1104 07/14/18 1423       Psychosocial Re-Evaluation   Current issues with  None Identified  None Identified  None Identified  None Identified    Comments  Jason Velazquez is doing well.  He feels that he is in a good place mentally and sleeping well.  We will continue to check up on him.   Jason Velazquez continues to do well at home.  He misses class and doing things as a community.  To stay busy, he is walking daily and currently reading 4 different books.  In addition to that, he continues to read his Bible daily as he reads it through each year.   Jason Velazquez is doing well and ready to get back to normal!!  Jason Velazquez continues to do well at home.  He is still getting in his exercise.  He is staying busy.     Expected Outcomes  Short: Continue to walk at home.  Long: Continue to stay positive.   Short: Continue to get out to walk.  Long: Continue to stay postive.   Short:  Continue to get out to walk.  Long: Continue to stay postive.   Short: Continue to get out  to walk.  Long: Continue to stay postive.     Interventions  Encouraged to attend Cardiac Rehabilitation for the exercise  -  Encouraged to attend Cardiac Rehabilitation for the exercise  Encouraged to attend Cardiac Rehabilitation for the exercise    Continue Psychosocial Services   Follow up required by staff  -  Follow up required by staff  Follow up required by staff       Psychosocial Discharge (Final Psychosocial Re-Evaluation): Psychosocial Re-Evaluation - 07/14/18 1423      Psychosocial Re-Evaluation   Current issues with  None Identified    Comments  Jason Velazquez continues to do well at home.  He is still getting in his exercise.  He is staying busy.     Expected Outcomes  Short: Continue to get out to walk.  Long: Continue to stay postive.     Interventions  Encouraged to attend Cardiac Rehabilitation for the exercise    Continue Psychosocial Services   Follow up required by staff       Vocational Rehabilitation: Provide vocational rehab assistance to qualifying candidates.   Vocational Rehab Evaluation & Intervention: Vocational Rehab - 04/13/18 1304      Initial Vocational Rehab Evaluation & Intervention   Assessment shows need for Vocational Rehabilitation  No       Education: Education Goals: Education classes will be provided on a variety of topics geared toward better understanding of heart health and risk factor modification. Participant will state understanding/return demonstration of topics presented as noted by education test scores.  Learning Barriers/Preferences: Learning Barriers/Preferences - 04/13/18 1303      Learning Barriers/Preferences   Learning Barriers  Hearing   hearing aids   Learning Preferences  Individual Instruction;Verbal Instruction       Education Topics:  AED/CPR: - Group verbal and written instruction with the use of models to demonstrate the basic  use of the AED with the basic ABC's of resuscitation.   General Nutrition Guidelines/Fats and Fiber: -Group instruction provided by verbal, written material, models and posters to present the general guidelines for heart healthy nutrition. Gives an explanation and review of dietary fats and fiber.   Cardiac Rehab from 04/20/2018 in [facility] Cardiac and Pulmonary Rehab  Date  04/20/18  Educator  [facility]  Instruction Review Code  1- Verbalizes Understanding      Controlling Sodium/Reading Food Labels: -Group verbal and written material supporting the discussion of sodium use in heart healthy nutrition. Review and explanation with models, verbal and written materials for utilization of the food label.   Exercise Physiology & General Exercise Guidelines: - Group verbal and written instruction with models to review the exercise physiology of the cardiovascular system and associated critical values. Provides general exercise guidelines with specific guidelines to those with heart or lung disease.    Aerobic Exercise & Resistance Training: - Gives group verbal and written instruction on the various components of exercise. Focuses on aerobic and resistive training programs and the benefits of this training and how to safely progress through these programs..   Flexibility, Balance, Mind/Body Relaxation: Provides group verbal/written instruction on the benefits of flexibility and balance training, including mind/body exercise modes such as yoga, pilates and tai chi.  Demonstration and skill practice provided.   Stress and Anxiety: - Provides group verbal and written instruction about the health risks of elevated stress and causes of high stress.  Discuss the correlation between heart/lung disease and anxiety and treatment options. Review healthy ways to manage with stress and  anxiety.   Depression: - Provides group verbal and written instruction on the correlation between heart/lung disease and depressed  mood, treatment options, and the stigmas associated with seeking treatment.   Anatomy & Physiology of the Heart: - Group verbal and written instruction and models provide basic cardiac anatomy and physiology, with the coronary electrical and arterial systems. Review of Valvular disease and Heart Failure   Cardiac Procedures: - Group verbal and written instruction to review commonly prescribed medications for heart disease. Reviews the medication, class of the drug, and side effects. Includes the steps to properly store meds and maintain the prescription regimen. (beta blockers and nitrates)   Cardiac Medications I: - Group verbal and written instruction to review commonly prescribed medications for heart disease. Reviews the medication, class of the drug, and side effects. Includes the steps to properly store meds and maintain the prescription regimen.   Cardiac Medications II: -Group verbal and written instruction to review commonly prescribed medications for heart disease. Reviews the medication, class of the drug, and side effects. (all other drug classes)    Go Sex-Intimacy & Heart Disease, Get SMART - Goal Setting: - Group verbal and written instruction through game format to discuss heart disease and the return to sexual intimacy. Provides group verbal and written material to discuss and apply goal setting through the application of the S.M.A.R.T. Method.   Other Matters of the Heart: - Provides group verbal, written materials and models to describe Stable Angina and Peripheral Artery. Includes description of the disease process and treatment options available to the cardiac patient.   Exercise & Equipment Safety: - Individual verbal instruction and demonstration of equipment use and safety with use of the equipment.   Cardiac Rehab from 04/20/2018 in [facility] Cardiac and Pulmonary Rehab  Date  04/13/18  Educator  [facility]  Instruction Review Code  1- Verbalizes Understanding       Infection Prevention: - Provides verbal and written material to individual with discussion of infection control including proper hand washing and proper equipment cleaning during exercise session.   Cardiac Rehab from 04/20/2018 in [facility] Cardiac and Pulmonary Rehab  Date  04/13/18  Educator  [facility]  Instruction Review Code  1- Verbalizes Understanding      Falls Prevention: - Provides verbal and written material to individual with discussion of falls prevention and safety.   Cardiac Rehab from 04/20/2018 in [facility] Cardiac and Pulmonary Rehab  Date  04/13/18  Educator  mc  Instruction Review Code  1- Verbalizes Understanding      Diabetes: - Individual verbal and written instruction to review signs/symptoms of diabetes, desired ranges of glucose level fasting, after meals and with exercise. Acknowledge that pre and post exercise glucose checks will be done for 3 sessions at entry of program.   Know Your Numbers and Risk Factors: -Group verbal and written instruction about important numbers in your health.  Discussion of what are risk factors and how they play a role in the disease process.  Review of Cholesterol, Blood Pressure, Diabetes, and BMI and the role they play in your overall health.   Sleep Hygiene: -Provides group verbal and written instruction about how sleep can affect your health.  Define sleep hygiene, discuss sleep cycles and impact of sleep habits. Review good sleep hygiene tips.    Other: -Provides group and verbal instruction on various topics (see comments)   Knowledge Questionnaire Score: Knowledge Questionnaire Score - 04/13/18 1304      Knowledge Questionnaire Score   Pre  Score  25/26   Correct response was reviewed with Elenore Rota today. He verbalized understanding of response and had no further questions today.      Core Components/Risk Factors/Patient Goals at Admission: Personal Goals and Risk Factors at Admission - 04/13/18 1304      Core  Components/Risk Factors/Patient Goals on Admission    Weight Management  Yes;Weight Loss    Intervention  Weight Management: Develop a combined nutrition and exercise program designed to reach desired caloric intake, while maintaining appropriate intake of nutrient and fiber, sodium and fats, and appropriate energy expenditure required for the weight goal.;Weight Management/Obesity: Establish reasonable short term and long term weight goals.    Admit Weight  181 lb 14.4 oz (82.5 kg)    Goal Weight: Short Term  179 lb (81.2 kg)    Goal Weight: Long Term  160 lb (72.6 kg)    Expected Outcomes  Short Term: Continue to assess and modify interventions until short term weight is achieved;Long Term: Adherence to nutrition and physical activity/exercise program aimed toward attainment of established weight goal;Weight Loss: Understanding of general recommendations for a balanced deficit meal plan, which promotes 1-2 lb weight loss per week and includes a negative energy balance of [phone] kcal/d    Hypertension  Yes    Intervention  Provide education on lifestyle modifcations including regular physical activity/exercise, weight management, moderate sodium restriction and increased consumption of fresh fruit, vegetables, and low fat dairy, alcohol moderation, and smoking cessation.;Monitor prescription use compliance.    Expected Outcomes  Short Term: Continued assessment and intervention until BP is < 140/38m HG in hypertensive participants. < 130/882mHG in hypertensive participants with diabetes, heart failure or chronic kidney disease.;Long Term: Maintenance of blood pressure at goal levels.    Lipids  Yes    Intervention  Provide education and support for participant on nutrition & aerobic/resistive exercise along with prescribed medications to achieve LDL <7070mHDL >3m75m  Expected Outcomes  Short Term: Participant states understanding of desired cholesterol values and is compliant with medications  prescribed. Participant is following exercise prescription and nutrition guidelines.;Long Term: Cholesterol controlled with medications as prescribed, with individualized exercise RX and with personalized nutrition plan. Value goals: LDL < 70mg55mL > 40 mg.       Core Components/Risk Factors/Patient Goals Review:  Goals and Risk Factor Review    Row Name 05/01/18 0858 05/26/18 1037 06/23/18 1104 07/14/18 1449       Core Components/Risk Factors/Patient Goals Review   Personal Goals Review  Weight Management/Obesity;Hypertension;Lipids  Weight Management/Obesity;Hypertension;Lipids  Weight Management/Obesity;Hypertension;Lipids  Weight Management/Obesity;Hypertension;Lipids    Review  Don hTimmothy Soursbeen doing well at home. He started walking today.  His weight is holding steady.  He would like to lose 10 lbs.  He has not been checking his blood pressures but will start.  He is doing well on his medications.   Don cTimmothy Soursinues to do well at home.  He checks his weight daily and it stays pretty steady, but he really would like to lose more weight. He has spoken with Melissa but does not remember what to do.  I reviewed the notes with him, but also requested if Melissa could reach back out to him as well.  He still does not check his blood pressures as he is not worried about them and figures if they get too high then he would notice.  He just assumes that all is well.   Don's weight has been steady. His pressures  are good. He had follow up visit a couple weeks ago and they restarted his amilodipine.  He is tolerating this well.   Don's weight is staying the same.  He continues to have good pressures.  He was concern that his pressures were higher in the afternoon.  He had a follow up wiht his doctor and they were not concerned.  According to the notes, they were running around 120-130s/60-70s.      Expected Outcomes  Short: Start to check his blood pressures Long: Continue to work on weight loss.   Short: Continue  to work on weight loss.  Long: Work on diet and exercise to help aid weight loss.  Short: Continue to work on weight loss  Long: Continue to monitor pressures.   Short: Continue to keep eye on blood pressures.  Long: Continue to work on weight loss.        Core Components/Risk Factors/Patient Goals at Discharge (Final Review):  Goals and Risk Factor Review - 07/14/18 1449      Core Components/Risk Factors/Patient Goals Review   Personal Goals Review  Weight Management/Obesity;Hypertension;Lipids    Review  Don's weight is staying the same.  He continues to have good pressures.  He was concern that his pressures were higher in the afternoon.  He had a follow up wiht his doctor and they were not concerned.  According to the notes, they were running around 120-130s/60-70s.      Expected Outcomes  Short: Continue to keep eye on blood pressures.  Long: Continue to work on weight loss.        ITP Comments: ITP Comments    Row Name 04/13/18 1254 04/28/18 1602 05/06/18 1147 08/17/18 1452     ITP Comments  Medical review completed today. ITP sent to Dr Loleta Chance for review, changes as needed and signature.  Documentation of diagnosis can be found in [facility] 03/31/2018  Our program is currently closed due to COVID-19.  We are communicating with patient via phone calls and emails.    30 day review. Continue with ITP unless directed changes by Medical Director chart review.  Jason Velazquez called and left a message that he would like to discharge from the program at this time.       Comments: Discharge ITP

## 2018-08-19 ENCOUNTER — Ambulatory Visit: Payer: Medicare Other

## 2018-08-20 ENCOUNTER — Ambulatory Visit: Payer: Medicare Other

## 2018-08-24 ENCOUNTER — Ambulatory Visit: Payer: Medicare Other

## 2018-08-26 ENCOUNTER — Ambulatory Visit: Payer: Medicare Other

## 2018-08-27 ENCOUNTER — Ambulatory Visit: Payer: Medicare Other

## 2018-08-31 ENCOUNTER — Ambulatory Visit: Payer: Medicare Other

## 2018-09-01 ENCOUNTER — Telehealth: Payer: Self-pay | Admitting: Internal Medicine

## 2018-09-01 NOTE — Progress Notes (Deleted)
Follow-up Outpatient Visit Date: 09/02/2018  Primary Care Provider: Tonia Ghent, MD [place] [address]  Chief Complaint: ***  HPI:  Jason Velazquez is a 85 y.o. year-old male with history of CAD s/p CABG (1998; LIMA-LAD, SVG-D1-D2, SVG-rPL) and subsequent PCI to proximal LCx, stroke with residual memory deficites, carotid artery stenosis, hypertension, hyperlipidemia, and BPH, who presents for follow-up of coronary artery disease.  We had a virtual visit in late April, at which time Mr. Bohlman was doing well following a hospitalization in 03/2022 NSTEMI with acute/subacute occlusion of SVG to small diagonal branches.  Decision was made to manage this medically.  Due to suboptimal blood pressure control, we agreed to add amlodipine 2.5 mg daily.  --------------------------------------------------------------------------------------------------  Past Medical History:  Diagnosis Date  . Allergic rhinitis   . Benign prostatic hypertrophy   . Coronary artery disease CARDIOLOGIST -- DR Daneen Schick  . Duodenal diverticulum 2003  . GERD (gastroesophageal reflux disease)   . H/O hiatal hernia 2003  . Heart murmur   . Hepatic lesion 2004  . History of melanoma excision    FACE  . Hypercholesteremia   . Hypertension   . Left carotid stenosis    > 50%  PER DUPLEX  03-28-2011  . Mild mitral regurgitation   . Perianal fistula   . S/P CABG x 4    1988  . S/P drug eluting coronary stent placement    POST CABG--  STENTING 2000  AND RESTENTING 2006 FOR REINSTENT STENOSIS  . Stroke [facility])    Past Surgical History:  Procedure Laterality Date  . CATARACT EXTRACTION W/ INTRAOCULAR LENS  IMPLANT, BILATERAL    . CORONARY ANGIOPLASTY WITH STENT PLACEMENT  2000   PCI AND STENTING CIRCUMFLEX  . CORONARY ANGIOPLASTY WITH STENT PLACEMENT  04-30-2004  DR Daneen Schick   RE-INSTENT STENOSIS/  DRUG-ELUTING STENT OF PROXIMAL AND MID CIRCUMFLEX/ PCI MID CIRCUMFLEX THROUGH STENT  STRUT/  WIDELY PATENT SAPHENOUS VEIN GRAFT AND  LIMA TO LAD GRAFT/ TOTAL OCCLUSION RIGHT CORONARY BEYOND THE POSTERIOR DESCENDING ARTERY BRANCH WITH 50% OSTIAL NARROWING/ TOTAL OCCLUSION OF LAD AT THE OSTIUM OF THE LEFT MAIN  . CORONARY ARTERY BYPASS GRAFT  1988   X5  . EVALUATION UNDER ANESTHESIA WITH ANAL FISTULECTOMY N/A 07/22/2012   Procedure: EXAM UNDER ANESTHESIA WITH ANAL fistulotomy;  Surgeon: Leighton Ruff, MD;  [facility];  Service: General;  Laterality: N/A;  . LEFT HEART CATH AND CORS/GRAFTS ANGIOGRAPHY N/A 03/31/2018   Procedure: LEFT HEART CATH AND CORS/GRAFTS ANGIOGRAPHY;  Surgeon: Nelva Bush, MD;  Location: [facility];  Service: Cardiovascular;  Laterality: N/A;  . SHOULDER ARTHROSCOPY WITH OPEN ROTATOR CUFF REPAIR Right 07-10-1999  . TONSILLECTOMY AND ADENOIDECTOMY  1944  . TRANSTHORACIC ECHOCARDIOGRAM  03-01-2011  DR Daneen Schick   NORMAL LVF AND LV SIZE/ MILD LEFT ATRIAL ENLARGEMENT/ GRADE II DIASTOLIC DYSFUNCTION WITH ELEVATED LEFT ATRIAL PRESSURE/ MILD TO MODERATE MR/ MILD TR  . TRANSURETHRAL RESECTION OF PROSTATE  04/09/2011   Procedure: TRANSURETHRAL RESECTION OF THE PROSTATE WITH GYRUS INSTRUMENTS;  Surgeon: Malka So, MD;  Location: [facility];  Service: Urology;  Laterality: N/A;    No outpatient medications have been marked as taking for the 09/02/18 encounter (Appointment) with Aldeen Riga, Harrell Gave, MD.    Allergies: Adhesive [tape], Iodine, and Niacin and related  Social History   Tobacco Use  . Smoking status: Never Smoker  . Smokeless tobacco: Never Used  Substance Use Topics  .  Alcohol use: No  . Drug use: No    Family History  Problem Relation Age of Onset  . Breast cancer Mother   . Stroke Father   . Colon cancer Brother     Review of Systems: A 12-system review of systems was performed and was negative except as noted in the HPI.   --------------------------------------------------------------------------------------------------  Physical Exam: There were no vitals taken for this visit.  General:  *** HEENT: No conjunctival pallor or scleral icterus. Moist mucous membranes.  OP clear. Neck: Supple without lymphadenopathy, thyromegaly, JVD, or HJR. No carotid bruit. Lungs: Normal work of breathing. Clear to auscultation bilaterally without wheezes or crackles. Heart: Regular rate and rhythm without murmurs, rubs, or gallops. Non-displaced PMI. Abd: Bowel sounds present. Soft, NT/ND without hepatosplenomegaly Ext: No lower extremity edema. Radial, PT, and DP pulses are 2+ bilaterally. Skin: Warm and dry without rash.  EKG:  ***  Lab Results  Component Value Date   WBC 8.1 04/02/2018   HGB 12.6 (L) 04/02/2018   HCT 38.1 (L) 04/02/2018   MCV 92.5 04/02/2018   PLT 174 04/02/2018    Lab Results  Component Value Date   NA 135 04/01/2018   K 4.3 04/01/2018   CL 106 04/01/2018   CO2 25 04/01/2018   BUN 11 04/01/2018   CREATININE 0.92 04/01/2018   GLUCOSE 104 (H) 04/01/2018   ALT 20 02/19/2018    Lab Results  Component Value Date   CHOL 137 03/31/2018   HDL 42 03/31/2018   LDLCALC 82 03/31/2018   TRIG 64 03/31/2018   CHOLHDL 3.3 03/31/2018    --------------------------------------------------------------------------------------------------  ASSESSMENT AND PLAN: Harrell Gave Carneshia Raker, MD 09/01/2018 10:54 AM

## 2018-09-01 NOTE — Telephone Encounter (Signed)

## 2018-09-02 ENCOUNTER — Ambulatory Visit: Payer: Medicare Other | Admitting: Internal Medicine

## 2018-09-02 ENCOUNTER — Ambulatory Visit: Payer: Medicare Other

## 2018-09-03 ENCOUNTER — Ambulatory Visit: Payer: Medicare Other

## 2018-09-07 ENCOUNTER — Ambulatory Visit: Payer: Medicare Other

## 2018-09-09 ENCOUNTER — Ambulatory Visit: Payer: Medicare Other

## 2018-09-10 ENCOUNTER — Ambulatory Visit: Payer: Medicare Other

## 2018-09-14 ENCOUNTER — Ambulatory Visit: Payer: Medicare Other

## 2018-09-16 ENCOUNTER — Ambulatory Visit: Payer: Medicare Other

## 2018-09-17 ENCOUNTER — Ambulatory Visit: Payer: Medicare Other

## 2018-09-21 ENCOUNTER — Ambulatory Visit: Payer: Medicare Other

## 2018-09-23 ENCOUNTER — Ambulatory Visit: Payer: Medicare Other

## 2018-09-24 ENCOUNTER — Ambulatory Visit: Payer: Medicare Other

## 2018-09-28 ENCOUNTER — Ambulatory Visit: Payer: Medicare Other

## 2018-09-30 ENCOUNTER — Telehealth: Payer: Self-pay

## 2018-09-30 ENCOUNTER — Ambulatory Visit: Payer: Medicare Other

## 2018-09-30 NOTE — Telephone Encounter (Signed)
Advised patient's daughter, Orpah Melter, that form for assisted living at [facility] has been filled out. This is mailed to the daughter per her request. She will check with [facility] if patient can get TB gold test done instead of Tb skin test and Dr. Damita Dunnings can order per his notes. Sending copy to be scanned

## 2018-10-01 ENCOUNTER — Ambulatory Visit: Payer: Medicare Other

## 2018-10-02 ENCOUNTER — Other Ambulatory Visit: Payer: Self-pay | Admitting: Family Medicine

## 2018-10-05 ENCOUNTER — Ambulatory Visit: Payer: Medicare Other

## 2018-10-07 ENCOUNTER — Ambulatory Visit: Payer: Medicare Other

## 2018-10-07 DIAGNOSIS — D3131 Benign neoplasm of right choroid: Secondary | ICD-10-CM | POA: Diagnosis not present

## 2018-10-08 ENCOUNTER — Ambulatory Visit: Payer: Medicare Other

## 2018-10-08 DIAGNOSIS — D2261 Melanocytic nevi of right upper limb, including shoulder: Secondary | ICD-10-CM | POA: Diagnosis not present

## 2018-10-08 DIAGNOSIS — D2262 Melanocytic nevi of left upper limb, including shoulder: Secondary | ICD-10-CM | POA: Diagnosis not present

## 2018-10-08 DIAGNOSIS — L821 Other seborrheic keratosis: Secondary | ICD-10-CM | POA: Diagnosis not present

## 2018-10-08 DIAGNOSIS — X32XXXA Exposure to sunlight, initial encounter: Secondary | ICD-10-CM | POA: Diagnosis not present

## 2018-10-08 DIAGNOSIS — Z85828 Personal history of other malignant neoplasm of skin: Secondary | ICD-10-CM | POA: Diagnosis not present

## 2018-10-08 DIAGNOSIS — Z08 Encounter for follow-up examination after completed treatment for malignant neoplasm: Secondary | ICD-10-CM | POA: Diagnosis not present

## 2018-10-08 DIAGNOSIS — D225 Melanocytic nevi of trunk: Secondary | ICD-10-CM | POA: Diagnosis not present

## 2018-10-08 DIAGNOSIS — D2271 Melanocytic nevi of right lower limb, including hip: Secondary | ICD-10-CM | POA: Diagnosis not present

## 2018-10-08 DIAGNOSIS — L570 Actinic keratosis: Secondary | ICD-10-CM | POA: Diagnosis not present

## 2018-10-08 DIAGNOSIS — D2272 Melanocytic nevi of left lower limb, including hip: Secondary | ICD-10-CM | POA: Diagnosis not present

## 2018-10-08 DIAGNOSIS — Z85820 Personal history of malignant melanoma of skin: Secondary | ICD-10-CM | POA: Diagnosis not present

## 2018-10-12 ENCOUNTER — Telehealth: Payer: Self-pay

## 2018-10-12 NOTE — Telephone Encounter (Signed)
Left detailed VM w COVID screen and back door lab info   

## 2018-10-14 ENCOUNTER — Other Ambulatory Visit: Payer: Self-pay | Admitting: Family Medicine

## 2018-10-14 ENCOUNTER — Other Ambulatory Visit (INDEPENDENT_AMBULATORY_CARE_PROVIDER_SITE_OTHER): Payer: Medicare Other

## 2018-10-14 DIAGNOSIS — E538 Deficiency of other specified B group vitamins: Secondary | ICD-10-CM | POA: Diagnosis not present

## 2018-10-14 LAB — VITAMIN B12: Vitamin B-12: 654 pg/mL (ref 211–911)

## 2018-10-28 ENCOUNTER — Other Ambulatory Visit: Payer: Medicare Other

## 2018-10-30 ENCOUNTER — Telehealth: Payer: Self-pay | Admitting: Family Medicine

## 2018-10-30 NOTE — Telephone Encounter (Signed)
Debbie with [facility] stated that faxed over paperwork on the 15th/.   FL2, Office Notes for the last 3-6 months and covid test order.   She stated that the patient has a nursing assesment on Monday so they would really like the have the office notes by Monday.   Have you received this paperwork ?     Debbie c/b #  [phone]

## 2018-11-01 NOTE — Telephone Encounter (Signed)
FL 2 done.  Please scan and send.  Please send med list, recent office notes and to covid test order.  Thanks.

## 2018-11-02 NOTE — Telephone Encounter (Signed)
FL2 faxed to [facility] with meds list and last OV.

## 2018-11-07 ENCOUNTER — Other Ambulatory Visit: Payer: Self-pay | Admitting: Physician Assistant

## 2018-11-09 DIAGNOSIS — Z03818 Encounter for observation for suspected exposure to other biological agents ruled out: Secondary | ICD-10-CM | POA: Diagnosis not present

## 2018-11-09 NOTE — Telephone Encounter (Signed)
Please advise if ok to refill. Last filled Isosorbide 30 mg tablet qd by PCP.

## 2018-11-10 NOTE — Telephone Encounter (Signed)
Approved.  

## 2018-11-27 ENCOUNTER — Other Ambulatory Visit: Payer: Self-pay | Admitting: Family Medicine

## 2018-11-30 ENCOUNTER — Other Ambulatory Visit: Payer: Self-pay | Admitting: Family Medicine

## 2018-12-01 ENCOUNTER — Telehealth: Payer: Self-pay | Admitting: Family Medicine

## 2018-12-01 ENCOUNTER — Other Ambulatory Visit: Payer: Self-pay | Admitting: *Deleted

## 2018-12-01 MED ORDER — PANTOPRAZOLE SODIUM 40 MG PO TBEC: 40.0000 mg | DELAYED_RELEASE_TABLET | Freq: Every day | ORAL | 3 refills | Status: DC

## 2018-12-01 MED ORDER — ATORVASTATIN CALCIUM 80 MG PO TABS: ORAL_TABLET | ORAL | 2 refills | Status: DC

## 2018-12-01 NOTE — Telephone Encounter (Signed)
Jeris Penta, called.  Patient has been out of his medication for a week.  She's requesting a refill for Atorvastatin 80 mg and a new rx for Pantoprazole be sent in to [facility].

## 2018-12-31 LAB — VITAMIN B12: Vitamin B-12: 575

## 2018-12-31 LAB — BASIC METABOLIC PANEL: Creatinine: 1.1 (ref <=1.3)

## 2018-12-31 LAB — CBC AND DIFFERENTIAL: Hemoglobin: 14.1 (ref 13.5–17.5)

## 2018-12-31 LAB — HEPATIC FUNCTION PANEL
ALT: 35 (ref 10–40)
AST: 24 (ref 14–40)

## 2019-01-01 LAB — BASIC METABOLIC PANEL: Potassium: 3.9 (ref 3.4–5.3)

## 2019-01-07 IMAGING — CT CT HEAD W/O CM
3 of 4 series · 14 of 47 positions shown, 16 images · non-contrast
Comparison: None.

CLINICAL DATA: Memory loss over the last 3-4 months, no injury

EXAM:
CT HEAD WITHOUT CONTRAST
TECHNIQUE: Contiguous axial images were obtained from the base of the skull
through the vertex without intravenous contrast.

[Series 2: head · axial · 0.36mm/px · z∈[-632,-513]mm · 8 of 29 slices shown, 10 images (1 of 3)]
[im 3/29  brain]
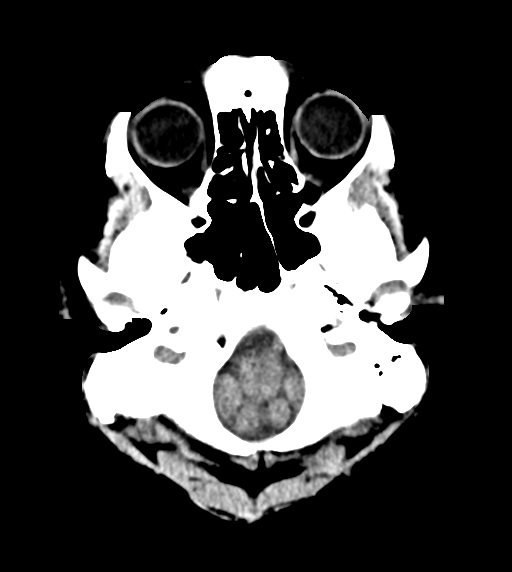
[im 3/29  bone]
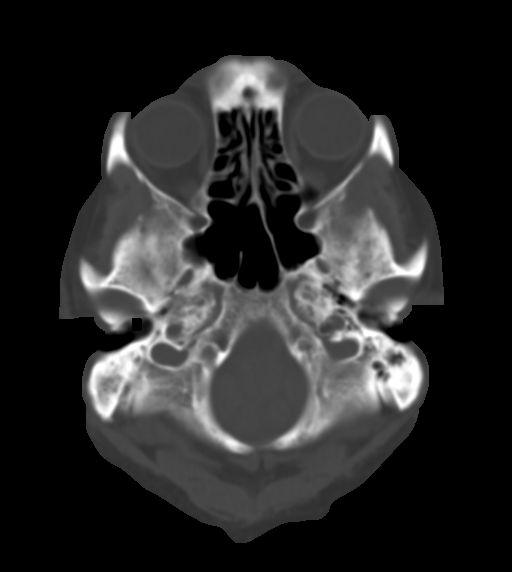
[im 7/29  brain]
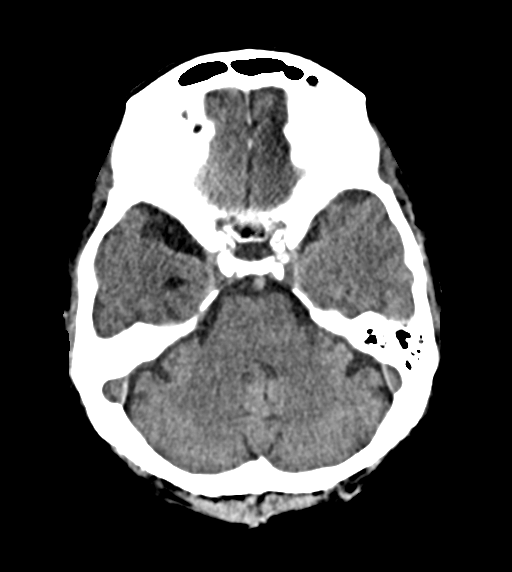
[im 11/29  brain]
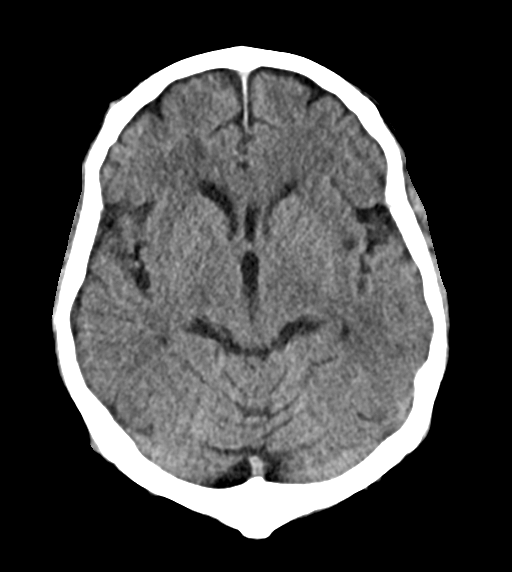
[im 13/29  brain]
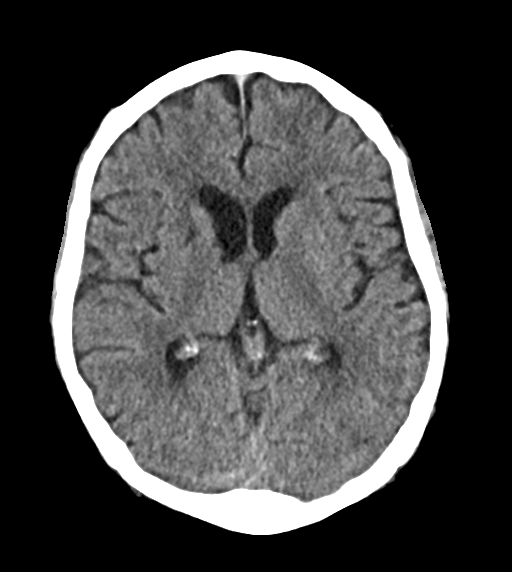
[im 17/29  brain]
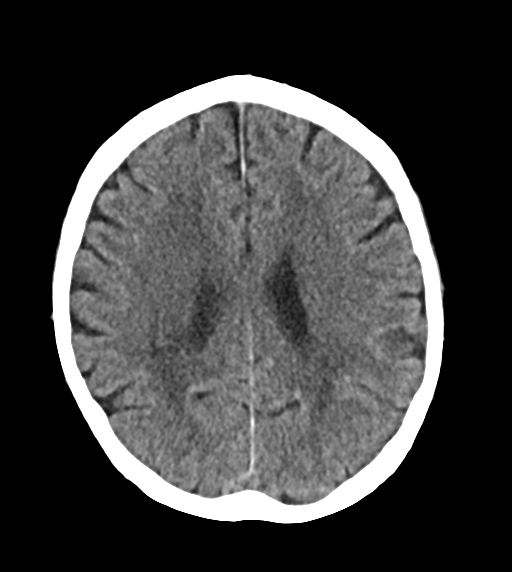
[im 17/29  bone]
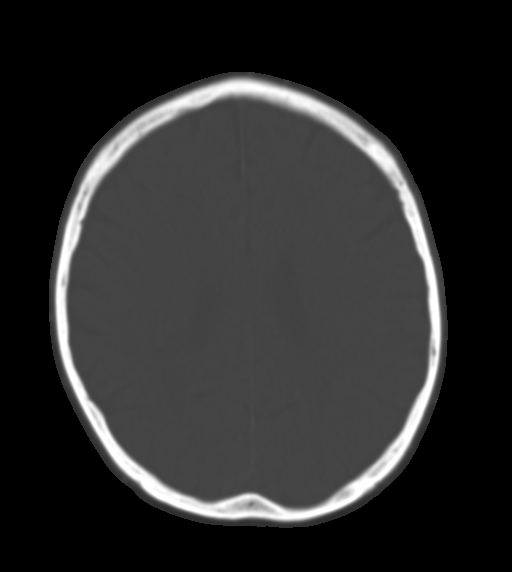
[im 19/29  brain]
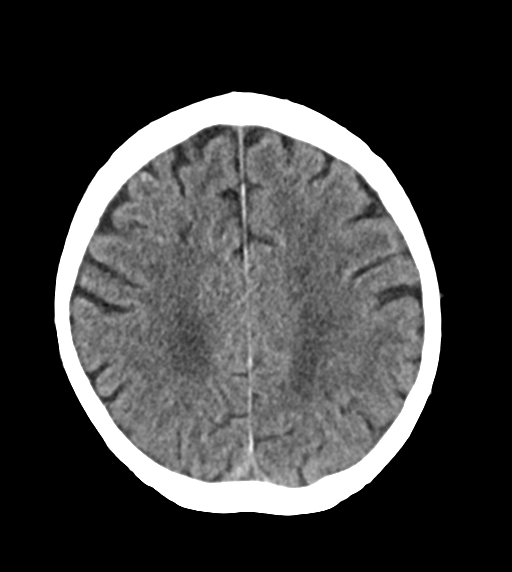
[im 23/29  brain]
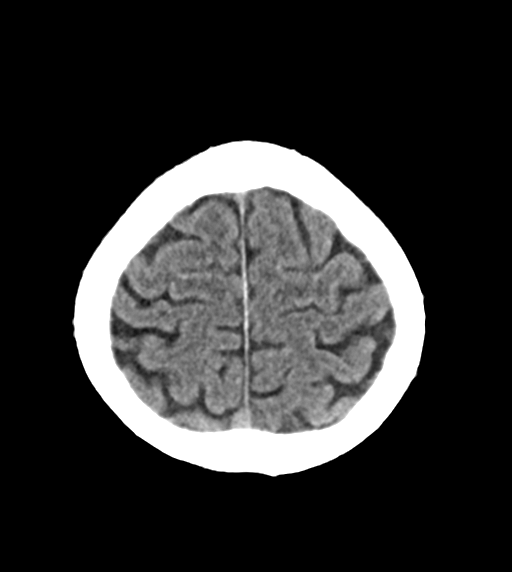
[im 27/29  brain]
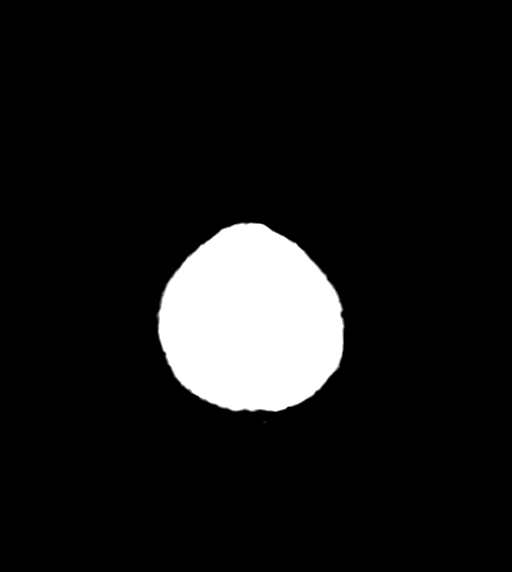

[Series 6: head · coronal · 0.29mm/px · 3 of 68 slices shown (2 of 3)]
[im 23/68  brain]
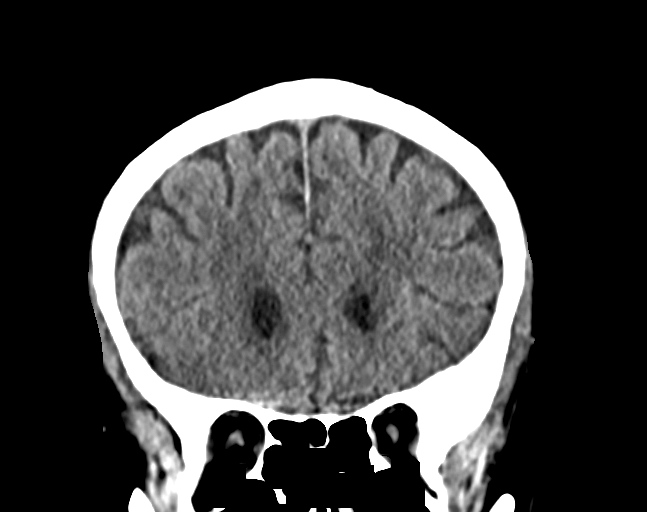
[im 30/68  brain]
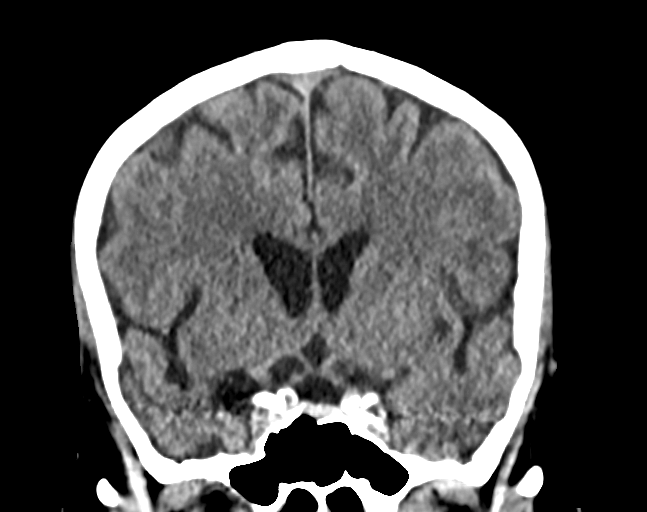
[im 38/68  brain]
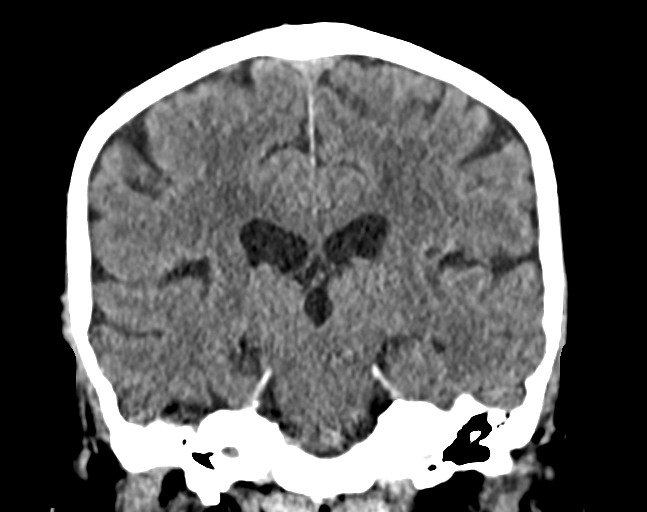

[Series 8: head · sagittal · 0.29mm/px · 3 of 61 slices shown (3 of 3)]
[im 21/61  brain]
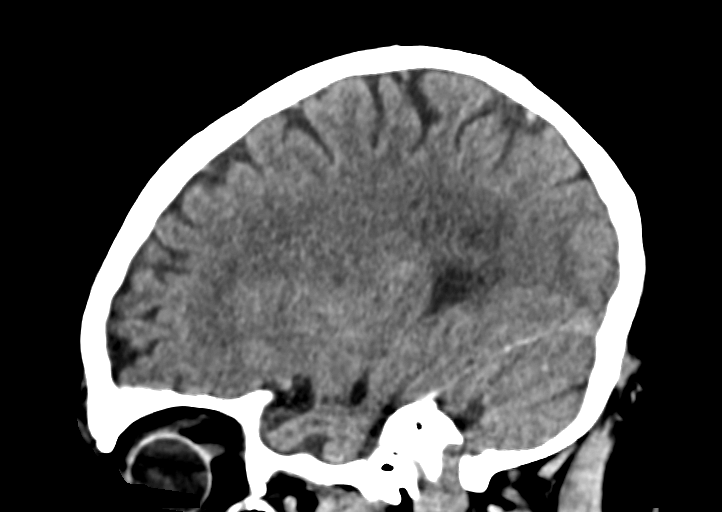
[im 31/61  brain]
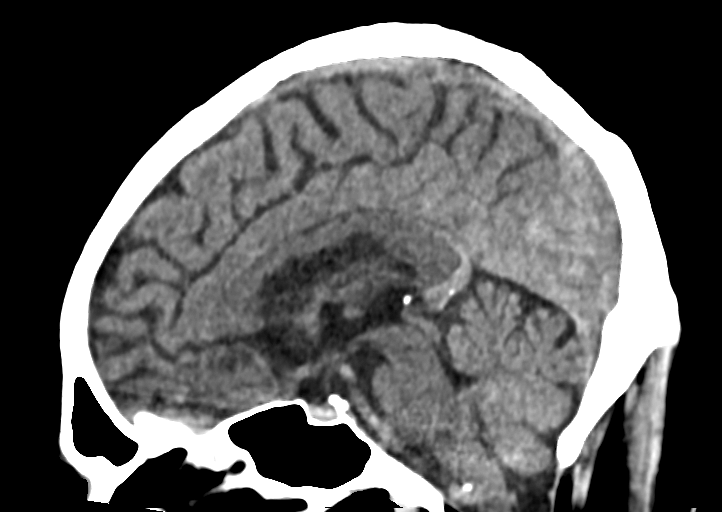
[im 41/61  brain]
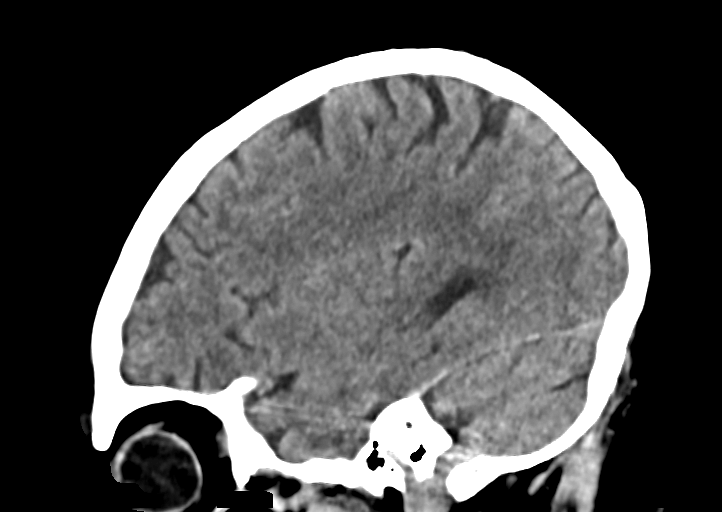

[14 of 47 positions shown; findings below may reference images not displayed]

FINDINGS: Brain: The ventricular system is slightly prominent and a cortical
sulci are prominent consistent with diffuse atrophy. The septum is
midline in position. A small right basal ganglial lacunar infarct is
present which is old, within the anterior limb of the right internal
capsule. Moderate small vessel ischemic change is present throughout
the periventricular white matter. No hemorrhage, mass lesion, or
acute infarction is seen.

Vascular: Considerable calcification of the vertebral and basilar
arteries is noted.

Skull: On bone window images, no calvarial abnormality is seen.

Sinuses/Orbits: The paranasal sinuses appear well pneumatized.

Other: None.
IMPRESSION: 1. Atrophy and moderate small vessel ischemic change.
2. Old small right basal ganglial lacunar infarct. No definite acute
abnormality.

## 2019-01-13 ENCOUNTER — Ambulatory Visit: Payer: Medicare Other | Admitting: Internal Medicine

## 2019-01-13 ENCOUNTER — Other Ambulatory Visit: Payer: Self-pay

## 2019-01-13 VITALS — BP 130/68 | HR 72

## 2019-01-13 DIAGNOSIS — M7989 Other specified soft tissue disorders: Secondary | ICD-10-CM | POA: Diagnosis not present

## 2019-01-14 ENCOUNTER — Encounter: Payer: Self-pay | Admitting: Internal Medicine

## 2019-01-14 NOTE — Patient Instructions (Signed)
Colon Mass, Adult  A colon mass is an abnormal growth in the large intestine (colon). A mass can cause a blockage (obstruction) or bleeding in the colon. What are the causes? This condition may be caused by:  Cancer.  Blood vessel problems.  Infection.  Inflammatory bowel disease (IBD).  An inflammation or infection of small pouches in the colon (diverticulitis).  A condition in which the lining of the uterus grows outside of the uterus (endometriosis). What are the signs or symptoms? Symptoms of this condition include:  Cramping.  Nausea.  Diarrhea.  Fever.  Vomiting.  Feeling weak.  Pain in the abdomen, side, or back.  Weight loss.  Constipation.  Bleeding from the rectum.  Loss of appetite.  Changes in bowel habits.  Feeling the need for a bowel movement after you have had one recently. In some cases, there are no symptoms. How is this diagnosed?     This condition may be diagnosed based on tests and procedures that are done to learn more about the mass. These may include:  Blood tests.  X-rays.  Ultrasound.  CT scan.  MRI.  Colonoscopy. This is a procedure in which a lubricated, flexible tube is inserted into the anus and then passed into the rectum and colon to examine the areas.  Removal of a tissue sample from the mass to be examined (biopsy). A biopsy may be taken during a colonoscopy. In some cases, what seems like a colon mass may actually be something else, such as scarring that formed after a surgery or an ulcer. How is this treated? Treatment depends on the cause of the mass. You and your health care provider will discuss the meaning of your test results and the recommended steps for starting treatment. If your case is serious, you may need emergency surgery. Follow these instructions at home: What you need to do at home depends on the cause of the mass. Follow instructions from your health care provider. In general:  Take  over-the-counter and prescription medicines only as told by your health care provider.  Keep all follow-up visits as told by your health care provider. This is important. Contact a health care provider if:  Your symptoms get worse.  You have new symptoms.  You have a fever.  Your pain does not get better with medicine.  You feel weaker.  You bruise or bleed easily. Get help right away if:  You vomit bright red blood or material that looks like coffee grounds.  You have blood in your stools (feces), or your stools look black and tarry.  You faint.  You feel that the mass has suddenly gotten larger.  You have severe swelling or pressure in your abdomen (bloating). Summary  A colon mass is an abnormal growth in the large intestine (colon). There are many possible causes of a colon mass.  Treatment depends on the cause of the mass. If your case is serious, you may need emergency surgery.  Take over-the-counter and prescription medicines only as told by your health care provider. This information is not intended to replace advice given to you by your health care provider. Make sure you discuss any questions you have with your health care provider. Document Released: 05/07/2006 Document Revised: 01/10/2017 Document Reviewed: 11/18/2016 Elsevier Patient Education  2020 [employer].

## 2019-01-14 NOTE — Progress Notes (Signed)
Subjective:    Patient ID: Jason Velazquez, male    DOB: 1928-02-16, 85 y.o.   MRN: [id number]  HPI  Asked to see resident in [address] C/o lump behind left knee. He noticed this 3-4 days ago. The area is tender to palpation but otherwise not painful. He has not noticed any redness, discharge or surrounding edema. It has not gotten bigger in size. He has never noticed a lump like this before. He denies fever, chills or body aches. He has not taken anything OTC for this.  Review of Systems      Past Medical History:  Diagnosis Date  . Allergic rhinitis   . Benign prostatic hypertrophy   . Coronary artery disease CARDIOLOGIST -- DR Daneen Schick  . Duodenal diverticulum 2003  . GERD (gastroesophageal reflux disease)   . H/O hiatal hernia 2003  . Heart murmur   . Hepatic lesion 2004  . History of melanoma excision    FACE  . Hypercholesteremia   . Hypertension   . Left carotid stenosis    > 50%  PER DUPLEX  03-28-2011  . Mild mitral regurgitation   . Perianal fistula   . S/P CABG x 4    1988  . S/P drug eluting coronary stent placement    POST CABG--  STENTING 2000  AND RESTENTING 2006 FOR REINSTENT STENOSIS  . Stroke [facility])     Current Outpatient Medications  Medication Sig Dispense Refill  . amLODipine (NORVASC) 2.5 MG tablet TAKE 1 TABLET BY MOUTH EVERY DAY 90 tablet 2  . aspirin EC 81 MG EC tablet Take 1 tablet (81 mg total) by mouth daily. 30 tablet 0  . atorvastatin (LIPITOR) 80 MG tablet TAKE 1 TABLET BY MOUTH EVERYDAY AT BEDTIME 90 tablet 2  . clopidogrel (PLAVIX) 75 MG tablet TAKE 1 TABLET BY MOUTH EVERY DAY 90 tablet 0  . cyanocobalamin (,VITAMIN B-12,) 1000 MCG/ML injection Inject 1 mL (1,000 mcg total) into the muscle every 30 (thirty) days. 1 mL 12  . isosorbide mononitrate (IMDUR) 30 MG 24 hr tablet TAKE 1 TABLET BY MOUTH EVERY DAY 90 tablet 1  . ketoconazole (NIZORAL) 2 % cream Apply 1 application topically 2 (two) times daily. 60 g 1  . loteprednol (LOTEMAX)  0.2 % SUSP 1 drop as needed.    . metoprolol succinate (TOPROL-XL) 25 MG 24 hr tablet TAKE 1 TABLET (25 MG TOTAL) BY MOUTH DAILY. TAKE WITH OR IMMEDIATELY FOLLOWING A MEAL. 90 tablet 0  . nitroGLYCERIN (NITROSTAT) 0.4 MG SL tablet Place 1 tablet (0.4 mg total) under the tongue every 5 (five) minutes as needed for chest pain. 30 tablet 0  . pantoprazole (PROTONIX) 40 MG tablet Take 1 tablet (40 mg total) by mouth daily. 90 tablet 3  . polyethylene glycol (MIRALAX / GLYCOLAX) packet Take 17 g by mouth daily.     No current facility-administered medications for this visit.     Allergies  Allergen Reactions  . Adhesive [Tape] Rash    ONLY USE PAPER TAPE  . Iodine Rash    Rash when applied to skin  . Niacin And Related Hives and Other (See Comments)    FLUSHING    Family History  Problem Relation Age of Onset  . Breast cancer Mother   . Stroke Father   . Colon cancer Brother     Social History   Socioeconomic History  . Marital status: Widowed    Spouse name: [name]  . Number of children: 3  .  Years of education: B.D.  . Highest education level: Not on file  Occupational History  . Occupation: retired  Scientific laboratory technician  . Financial resource strain: Not on file  . Food insecurity    Worry: Not on file    Inability: Not on file  . Transportation needs    Medical: Not on file    Non-medical: Not on file  Tobacco Use  . Smoking status: Never Smoker  . Smokeless tobacco: Never Used  Substance and Sexual Activity  . Alcohol use: No  . Drug use: No  . Sexual activity: Not Currently  Lifestyle  . Physical activity    Days per week: Not on file    Minutes per session: Not on file  . Stress: Not on file  Relationships  . Social Herbalist on phone: Not on file    Gets together: Not on file    Attends religious service: Not on file    Active member of club or organization: Not on file    Attends meetings of clubs or organizations: Not on file    Relationship  status: Not on file  . Intimate partner violence    Fear of current or ex partner: Not on file    Emotionally abused: Not on file    Physically abused: Not on file    Forced sexual activity: Not on file  Other Topics Concern  . Not on file  Social History Narrative   Lives alone, at [facility].  Has some help with housecleaning but o/w he is independent.     Was married to first wife 95 years, then to second wife 12 years.  Widowed twice.        Likes to travel.     Retired Theme park manager, [employer]   One son was killed in [place] in 1984   One son and one daughter (she is an Therapist, sports) still alive in 2019     Constitutional: Denies fever, malaise, fatigue, headache or abrupt weight changes.  Skin: Pt reports lump behind left knee. Denies redness, rashes, or ulcercations.    No other specific complaints in a complete review of systems (except as listed in HPI above).  Objective:   Physical Exam   BP 130/68   Pulse 72  Wt Readings from Last 3 Encounters:  06/01/18 175 lb (79.4 kg)  04/23/18 184 lb 7 oz (83.7 kg)  04/17/18 180 lb 12 oz (82 kg)    General: Appears his stated age, well developed, well nourished in NAD. Skin: 1 cm raised irregularly shaped mass noted of posterior medial knee. Tender with palpation. No erythema, warmth or discharge noted. Cardiovascular: Varicose veins noted of BLE. Musculoskeletal:  No difficulty with gait.  Neurological: Alert and oriented.    BMET    Component Value Date/Time   NA 135 04/01/2018 0520   K 4.3 04/01/2018 0520   CL 106 04/01/2018 0520   CO2 25 04/01/2018 0520   GLUCOSE 104 (H) 04/01/2018 0520   BUN 11 04/01/2018 0520   CREATININE 0.92 04/01/2018 0520   CALCIUM 8.4 (L) 04/01/2018 0520   GFRNONAA >60 04/01/2018 0520   GFRAA >60 04/01/2018 0520    Lipid Panel     Component Value Date/Time   CHOL 137 03/31/2018 0552   CHOL 138 12/16/2012 0837   TRIG 64 03/31/2018 0552   HDL 42 03/31/2018 0552   HDL 46 12/16/2012  0837   CHOLHDL 3.3 03/31/2018 0552   VLDL 13  03/31/2018 0552   LDLCALC 82 03/31/2018 0552   LDLCALC 75 12/16/2012 0837    CBC    Component Value Date/Time   WBC 8.1 04/02/2018 0341   RBC 4.12 (L) 04/02/2018 0341   HGB 12.6 (L) 04/02/2018 0341   HCT 38.1 (L) 04/02/2018 0341   PLT 174 04/02/2018 0341   MCV 92.5 04/02/2018 0341   MCH 30.6 04/02/2018 0341   MCHC 33.1 04/02/2018 0341   RDW 12.6 04/02/2018 0341   LYMPHSABS 1.6 03/31/2018 0223   MONOABS 0.7 03/31/2018 0223   EOSABS 0.2 03/31/2018 0223   BASOSABS 0.1 03/31/2018 0223    Hgb A1C Lab Results  Component Value Date   HGBA1C 5.8 (H) 05/27/2013        Assessment & Plan:   Lump, Left Knee:  Does not appear to be an abscess No indication for abx at this time Could be thrombosed varicose vein- on ASA and Plavix Apply warm compresses BID prn  Will reeval in 1 week, sooner if needed Webb Silversmith, NP This visit occurred during the SARS-CoV-2 public health emergency.  Safety protocols were in place, including screening questions prior to the visit, additional usage of staff PPE, and extensive cleaning of exam room while observing appropriate contact time as indicated for disinfecting solutions.

## 2019-01-22 ENCOUNTER — Ambulatory Visit: Payer: Medicare Other | Admitting: Internal Medicine

## 2019-01-22 VITALS — BP 138/70 | HR 76

## 2019-01-22 DIAGNOSIS — M7989 Other specified soft tissue disorders: Secondary | ICD-10-CM

## 2019-01-25 ENCOUNTER — Encounter: Payer: Self-pay | Admitting: Internal Medicine

## 2019-01-25 NOTE — Progress Notes (Signed)
Subjective:    Patient ID: Jason Velazquez, male    DOB: 23-Oct-1928, 85 y.o.   MRN: [id number]  HPI  Asked to reevaluate resident in [address] Persistent mass behind left knee, unchanged from 12/2 visit Resident reports pain with palpation, no increase in size, or discharge noted He denies lower leg swelling or difficulty with gait.  Review of Systems    Past Medical History:  Diagnosis Date  . Allergic rhinitis   . Benign prostatic hypertrophy   . Coronary artery disease CARDIOLOGIST -- DR Daneen Schick  . Duodenal diverticulum 2003  . GERD (gastroesophageal reflux disease)   . H/O hiatal hernia 2003  . Heart murmur   . Hepatic lesion 2004  . History of melanoma excision    FACE  . Hypercholesteremia   . Hypertension   . Left carotid stenosis    > 50%  PER DUPLEX  03-28-2011  . Mild mitral regurgitation   . Perianal fistula   . S/P CABG x 4    1988  . S/P drug eluting coronary stent placement    POST CABG--  STENTING 2000  AND RESTENTING 2006 FOR REINSTENT STENOSIS  . Stroke [facility])     Current Outpatient Medications  Medication Sig Dispense Refill  . amLODipine (NORVASC) 2.5 MG tablet TAKE 1 TABLET BY MOUTH EVERY DAY 90 tablet 2  . aspirin EC 81 MG EC tablet Take 1 tablet (81 mg total) by mouth daily. 30 tablet 0  . atorvastatin (LIPITOR) 80 MG tablet TAKE 1 TABLET BY MOUTH EVERYDAY AT BEDTIME 90 tablet 2  . clopidogrel (PLAVIX) 75 MG tablet TAKE 1 TABLET BY MOUTH EVERY DAY 90 tablet 0  . cyanocobalamin (,VITAMIN B-12,) 1000 MCG/ML injection Inject 1 mL (1,000 mcg total) into the muscle every 30 (thirty) days. 1 mL 12  . isosorbide mononitrate (IMDUR) 30 MG 24 hr tablet TAKE 1 TABLET BY MOUTH EVERY DAY 90 tablet 1  . ketoconazole (NIZORAL) 2 % cream Apply 1 application topically 2 (two) times daily. 60 g 1  . loteprednol (LOTEMAX) 0.2 % SUSP 1 drop as needed.    . metoprolol succinate (TOPROL-XL) 25 MG 24 hr tablet TAKE 1 TABLET (25 MG TOTAL) BY MOUTH DAILY. TAKE WITH OR  IMMEDIATELY FOLLOWING A MEAL. 90 tablet 0  . nitroGLYCERIN (NITROSTAT) 0.4 MG SL tablet Place 1 tablet (0.4 mg total) under the tongue every 5 (five) minutes as needed for chest pain. 30 tablet 0  . pantoprazole (PROTONIX) 40 MG tablet Take 1 tablet (40 mg total) by mouth daily. 90 tablet 3  . polyethylene glycol (MIRALAX / GLYCOLAX) packet Take 17 g by mouth daily.     No current facility-administered medications for this visit.    Allergies  Allergen Reactions  . Adhesive [Tape] Rash    ONLY USE PAPER TAPE  . Iodine Rash    Rash when applied to skin  . Niacin And Related Hives and Other (See Comments)    FLUSHING    Family History  Problem Relation Age of Onset  . Breast cancer Mother   . Stroke Father   . Colon cancer Brother     Social History   Socioeconomic History  . Marital status: Widowed    Spouse name: [name]  . Number of children: 3  . Years of education: B.D.  . Highest education level: Not on file  Occupational History  . Occupation: retired  Tobacco Use  . Smoking status: Never Smoker  . Smokeless tobacco: Never  Used  Substance and Sexual Activity  . Alcohol use: No  . Drug use: No  . Sexual activity: Not Currently  Other Topics Concern  . Not on file  Social History Narrative   Lives alone, at [facility]  Has some help with housecleaning but o/w he is independent.     Was married to first wife 7 years, then to second wife 12 years.  Widowed twice.        Likes to travel.     Retired Theme park manager, [employer]   One son was killed in [place] in 1984   One son and one daughter (she is an Therapist, sports) still alive in 2019   Social Determinants of Health   Financial Resource Strain:   . Difficulty of Paying Living Expenses: Not on file  Food Insecurity:   . Worried About Charity fundraiser in the Last Year: Not on file  . Ran Out of Food in the Last Year: Not on file  Transportation Needs:   . Lack of Transportation (Medical): Not on file  . Lack  of Transportation (Non-Medical): Not on file  Physical Activity:   . Days of Exercise per Week: Not on file  . Minutes of Exercise per Session: Not on file  Stress:   . Feeling of Stress : Not on file  Social Connections:   . Frequency of Communication with Friends and Family: Not on file  . Frequency of Social Gatherings with Friends and Family: Not on file  . Attends Religious Services: Not on file  . Active Member of Clubs or Organizations: Not on file  . Attends Archivist Meetings: Not on file  . Marital Status: Not on file  Intimate Partner Violence:   . Fear of Current or Ex-Partner: Not on file  . Emotionally Abused: Not on file  . Physically Abused: Not on file  . Sexually Abused: Not on file     Constitutional: Denies fever, malaise, fatigue, headache or abrupt weight changes.  Musculoskeletal: Denies decrease in range of motion, difficulty with gait, muscle pain or joint pain and swelling.  Skin: Pt reports mass behind left knee. Denies redness, rashes, or ulcercations.    No other specific complaints in a complete review of systems (except as listed in HPI above).     Objective:   Physical Exam  BP 138/70   Pulse 76  Wt Readings from Last 3 Encounters:  06/01/18 175 lb (79.4 kg)  04/23/18 184 lb 7 oz (83.7 kg)  04/17/18 180 lb 12 oz (82 kg)    General: Appears his stated age, well developed, well nourished in NAD. Skin: Warm, dry and intact. 1 cm mass noted of posterior medial left knee. Cardiovascular: Normal rate and rhythm.  Pulmonary/Chest: Normal effort and positive vesicular breath sounds. No respiratory distress. No wheezes, rales or ronchi noted.  Musculoskeletal: Normal flexion and extension of the left knee. No signs of joint swelling. No difficulty with gait.  Neurological: Alert and oriented.   BMET    Component Value Date/Time   NA 135 04/01/2018 0520   K 4.3 04/01/2018 0520   CL 106 04/01/2018 0520   CO2 25 04/01/2018 0520    GLUCOSE 104 (H) 04/01/2018 0520   BUN 11 04/01/2018 0520   CREATININE 0.92 04/01/2018 0520   CALCIUM 8.4 (L) 04/01/2018 0520   GFRNONAA >60 04/01/2018 0520   GFRAA >60 04/01/2018 0520    Lipid Panel     Component Value Date/Time  CHOL 137 03/31/2018 0552   CHOL 138 12/16/2012 0837   TRIG 64 03/31/2018 0552   HDL 42 03/31/2018 0552   HDL 46 12/16/2012 0837   CHOLHDL 3.3 03/31/2018 0552   VLDL 13 03/31/2018 0552   LDLCALC 82 03/31/2018 0552   LDLCALC 75 12/16/2012 0837    CBC    Component Value Date/Time   WBC 8.1 04/02/2018 0341   RBC 4.12 (L) 04/02/2018 0341   HGB 12.6 (L) 04/02/2018 0341   HCT 38.1 (L) 04/02/2018 0341   PLT 174 04/02/2018 0341   MCV 92.5 04/02/2018 0341   MCH 30.6 04/02/2018 0341   MCHC 33.1 04/02/2018 0341   RDW 12.6 04/02/2018 0341   LYMPHSABS 1.6 03/31/2018 0223   MONOABS 0.7 03/31/2018 0223   EOSABS 0.2 03/31/2018 0223   BASOSABS 0.1 03/31/2018 0223    Hgb A1C Lab Results  Component Value Date   HGBA1C 5.8 (H) 05/27/2013         Assessment & Plan:   Soft Tissue Mass of Knee:  I still think this is a varicose vein He is on ASA Will give a few days of Keflex just to make sure it is not an abscess without cellulitis  If no improvement with abx, will have him follow up with PCP at next scheduled visit Webb Silversmith, NP This visit occurred during the SARS-CoV-2 public health emergency.  Safety protocols were in place, including screening questions prior to the visit, additional usage of staff PPE, and extensive cleaning of exam room while observing appropriate contact time as indicated for disinfecting solutions.

## 2019-01-25 NOTE — Patient Instructions (Signed)
Varicose Veins Varicose veins are veins that have become enlarged, bulged, and twisted. They most often appear in the legs. What are the causes? This condition is caused by damage to the valves in the vein. These valves help blood return to your heart. When they are damaged and they stop working properly, blood may flow backward and back up in the veins near the skin, causing the veins to get larger and appear twisted. The condition can result from any issue that causes blood to back up, like pregnancy, prolonged standing, or obesity. What increases the risk? This condition is more likely to develop in people who are:  On their feet a lot.  Pregnant.  Overweight. What are the signs or symptoms? Symptoms of this condition include:  Bulging, twisted, and bluish veins.  A feeling of heaviness. This may be worse at the end of the day.  Leg pain. This may be worse at the end of the day.  Swelling in the leg.  Changes in skin color over the veins. How is this diagnosed? This condition may be diagnosed based on your symptoms, a physical exam, and an ultrasound test. How is this treated? Treatment for this condition may involve:  Avoiding sitting or standing in one position for long periods of time.  Wearing compression stockings. These stockings help to prevent blood clots and reduce swelling in the legs.  Raising (elevating) the legs when resting.  Losing weight.  Exercising regularly. If you have persistent symptoms or want to improve the way your varicose veins look, you may choose to have a procedure to close the varicose veins off or to remove them. Treatments to close off the veins include:  Sclerotherapy. In this treatment, a solution is injected into a vein to close it off.  Laser treatment. In this treatment, the vein is heated with a laser to close it off.  Radiofrequency vein ablation. In this treatment, an electrical current produced by radio waves is used to close  off the vein. Treatments to remove the veins include:  Phlebectomy. In this treatment, the veins are removed through small incisions made over the veins.  Vein ligation and stripping. In this treatment, incisions are made over the veins. The veins are then removed after being tied (ligated) with stitches (sutures). Follow these instructions at home: Activity  Walk as much as possible. Walking increases blood flow. This helps blood return to the heart and takes pressure off your veins. It also increases your cardiovascular strength.  Follow your health care provider's instructions about exercising.  Do not stand or sit in one position for a long period of time.  Do not sit with your legs crossed.  Rest with your legs raised during the day. General instructions   Follow any diet instructions given to you by your health care provider.  Wear compression stockings as directed by your health care provider. Do not wear other kinds of tight clothing around your legs, pelvis, or waist.  Elevate your legs at night to above the level of your heart.  If you get a cut in the skin over the varicose vein and the vein bleeds: ? Lie down with your leg raised. ? Apply firm pressure to the cut with a clean cloth until the bleeding stops. ? Place a bandage (dressing) on the cut. Contact a health care provider if:  The skin around your varicose veins starts to break down.  You have pain, redness, tenderness, or hard swelling over a vein.  You   are uncomfortable because of pain.  You get a cut in the skin over a varicose vein and it will not stop bleeding. Summary  Varicose veins are veins that have become enlarged, bulged, and twisted. They most often appear in the legs.  This condition is caused by damage to the valves in the vein. These valves help blood return to your heart.  Treatment for this condition includes frequent movements, wearing compression stockings, losing weight, and  exercising regularly. In some cases, procedures are done to close off or remove the veins.  Treatment for this condition may include wearing compression stockings, elevating the legs, losing weight, and engaging in regular activity. In some cases, procedures are done to close off or remove the veins. This information is not intended to replace advice given to you by your health care provider. Make sure you discuss any questions you have with your health care provider. Document Released: 11/07/2004 Document Revised: 03/26/2018 Document Reviewed: 02/21/2016 Elsevier Patient Education  2020 [employer]  

## 2019-02-18 ENCOUNTER — Other Ambulatory Visit: Payer: Self-pay | Admitting: Family Medicine

## 2019-02-22 ENCOUNTER — Ambulatory Visit: Payer: Medicare Other

## 2019-02-22 ENCOUNTER — Telehealth: Payer: Self-pay

## 2019-02-22 ENCOUNTER — Other Ambulatory Visit: Payer: Self-pay

## 2019-02-22 DIAGNOSIS — Z23 Encounter for immunization: Secondary | ICD-10-CM | POA: Diagnosis not present

## 2019-02-22 NOTE — Telephone Encounter (Signed)
Called patient 3 times trying to complete his Medicare Wellness visit. Patient never answered. Unable to leave message because voicemail is full. Appointment has been cancelled and will have to be rescheduled or completed by provider at physical.

## 2019-02-22 NOTE — Telephone Encounter (Signed)
Pt left v/m that he could not keep his [place] 02/22/19 at 10:30 with Andrez Grime LPN. Pt request cb.

## 2019-02-26 ENCOUNTER — Ambulatory Visit: Payer: Medicare Other

## 2019-03-01 ENCOUNTER — Encounter: Payer: Medicare Other | Admitting: Family Medicine

## 2019-03-02 ENCOUNTER — Telehealth: Payer: Self-pay | Admitting: Family Medicine

## 2019-03-02 ENCOUNTER — Encounter: Payer: Self-pay | Admitting: Family Medicine

## 2019-03-02 NOTE — Telephone Encounter (Signed)
I will discuss this with Austin Miles, RN at [facility]. She will get the pt scheduled with me.

## 2019-03-02 NOTE — Telephone Encounter (Signed)
Please check with patient/family.  I talked with Webb Silversmith in the meantime.  She graciously volunteered to perform his annual exam at [facility] due to the ongoing pandemic.  I think this is reasonable and I appreciate her help.  Please make sure the patient is okay with this.  Please schedule with Rollene Fare in a way that meets the patient's schedule and her schedule.  I only recently saw copy of his labs that were drawn this fall.  I do not recall these ever being sent to the clinic here.  We will get these scanned in.  Please let the patient know that his labs are fine.  Sugar was normal.  Kidney function is stable.  Liver tests are normal.  Blood counts are normal.  B12 is normal.  Thanks.

## 2019-03-06 ENCOUNTER — Other Ambulatory Visit: Payer: Self-pay | Admitting: Family Medicine

## 2019-03-10 ENCOUNTER — Other Ambulatory Visit: Payer: Self-pay

## 2019-03-10 ENCOUNTER — Ambulatory Visit: Payer: Medicare Other | Admitting: Internal Medicine

## 2019-03-10 ENCOUNTER — Encounter: Payer: Self-pay | Admitting: Internal Medicine

## 2019-03-10 DIAGNOSIS — I214 Non-ST elevation (NSTEMI) myocardial infarction: Secondary | ICD-10-CM

## 2019-03-10 DIAGNOSIS — I25810 Atherosclerosis of coronary artery bypass graft(s) without angina pectoris: Secondary | ICD-10-CM

## 2019-03-10 DIAGNOSIS — N400 Enlarged prostate without lower urinary tract symptoms: Secondary | ICD-10-CM | POA: Diagnosis not present

## 2019-03-10 DIAGNOSIS — K219 Gastro-esophageal reflux disease without esophagitis: Secondary | ICD-10-CM

## 2019-03-10 DIAGNOSIS — L309 Dermatitis, unspecified: Secondary | ICD-10-CM

## 2019-03-10 DIAGNOSIS — I10 Essential (primary) hypertension: Secondary | ICD-10-CM

## 2019-03-10 DIAGNOSIS — E785 Hyperlipidemia, unspecified: Secondary | ICD-10-CM

## 2019-03-10 NOTE — Assessment & Plan Note (Signed)
Controlled on Amlodipine and Metoprolol Will monitor DASH diet, encouraged exercise

## 2019-03-10 NOTE — Assessment & Plan Note (Signed)
Triamcinolone 0.1% BID prn

## 2019-03-10 NOTE — Assessment & Plan Note (Signed)
Not an issue off meds °

## 2019-03-10 NOTE — Assessment & Plan Note (Signed)
Continue Pantoprazole as prescribed Continue Tums/Mylanta as needed Will monitor

## 2019-03-10 NOTE — Patient Instructions (Signed)
Health Maintenance After Age 84 After age 84, you are at a higher risk for certain long-term diseases and infections as well as injuries from falls. Falls are a major cause of broken bones and head injuries in people who are older than age 84. Getting regular preventive care can help to keep you healthy and well. Preventive care includes getting regular testing and making lifestyle changes as recommended by your health care provider. Talk with your health care provider about:  Which screenings and tests you should have. A screening is a test that checks for a disease when you have no symptoms.  A diet and exercise plan that is right for you. What should I know about screenings and tests to prevent falls? Screening and testing are the best ways to find a health problem early. Early diagnosis and treatment give you the best chance of managing medical conditions that are common after age 84. Certain conditions and lifestyle choices may make you more likely to have a fall. Your health care provider may recommend:  Regular vision checks. Poor vision and conditions such as cataracts can make you more likely to have a fall. If you wear glasses, make sure to get your prescription updated if your vision changes.  Medicine review. Work with your health care provider to regularly review all of the medicines you are taking, including over-the-counter medicines. Ask your health care provider about any side effects that may make you more likely to have a fall. Tell your health care provider if any medicines that you take make you feel dizzy or sleepy.  Osteoporosis screening. Osteoporosis is a condition that causes the bones to get weaker. This can make the bones weak and cause them to break more easily.  Blood pressure screening. Blood pressure changes and medicines to control blood pressure can make you feel dizzy.  Strength and balance checks. Your health care provider may recommend certain tests to check your  strength and balance while standing, walking, or changing positions.  Foot health exam. Foot pain and numbness, as well as not wearing proper footwear, can make you more likely to have a fall.  Depression screening. You may be more likely to have a fall if you have a fear of falling, feel emotionally low, or feel unable to do activities that you used to do.  Alcohol use screening. Using too much alcohol can affect your balance and may make you more likely to have a fall. What actions can I take to lower my risk of falls? General instructions  Talk with your health care provider about your risks for falling. Tell your health care provider if: ? You fall. Be sure to tell your health care provider about all falls, even ones that seem minor. ? You feel dizzy, sleepy, or off-balance.  Take over-the-counter and prescription medicines only as told by your health care provider. These include any supplements.  Eat a healthy diet and maintain a healthy weight. A healthy diet includes low-fat dairy products, low-fat (lean) meats, and fiber from whole grains, beans, and lots of fruits and vegetables. Home safety  Remove any tripping hazards, such as rugs, cords, and clutter.  Install safety equipment such as grab bars in bathrooms and safety rails on stairs.  Keep rooms and walkways well-lit. Activity   Follow a regular exercise program to stay fit. This will help you maintain your balance. Ask your health care provider what types of exercise are appropriate for you.  If you need a cane or   walker, use it as recommended by your health care provider.  Wear supportive shoes that have nonskid soles. Lifestyle  Do not drink alcohol if your health care provider tells you not to drink.  If you drink alcohol, limit how much you have: ? 0-1 drink a day for women. ? 0-2 drinks a day for men.  Be aware of how much alcohol is in your drink. In the [place] one drink equals one typical bottle of beer (12  oz), one-half glass of wine (5 oz), or one shot of hard liquor (1 oz).  Do not use any products that contain nicotine or tobacco, such as cigarettes and e-cigarettes. If you need help quitting, ask your health care provider. Summary  Having a healthy lifestyle and getting preventive care can help to protect your health and wellness after age 84.  Screening and testing are the best way to find a health problem early and help you avoid having a fall. Early diagnosis and treatment give you the best chance for managing medical conditions that are more common for people who are older than age 84.  Falls are a major cause of broken bones and head injuries in people who are older than age 84. Take precautions to prevent a fall at home.  Work with your health care provider to learn what changes you can make to improve your health and wellness and to prevent falls. This information is not intended to replace advice given to you by your health care provider. Make sure you discuss any questions you have with your health care provider. Document Revised: 05/21/2018 Document Reviewed: 12/11/2016 Elsevier Patient Education  2020 [employer]vier Inc.  

## 2019-03-10 NOTE — Progress Notes (Signed)
HPI:  Pt seen in [address] for annual subsequent Medicare Wellness Exam. He is also due to follow up chronic conditions.  Reviewed with RN, no new concerns. He sleeps well. His appetite is good. He has gained a little weight. He is independent with his ADL's. He walks without a device. He denies recent falls. He is HOH and wears hearing aids. He denies mood issues. He denies pain or SOB.  BPH: He voids fine without medication.   GERD: He reports occasional breakthrough on Pantoprazole for which he takes Tums or Mylanta with good relief. He denies difficulty swallowing. There is no upper GI on file.   HLD with CAD s/p CABG, HX of Stroke: Mainly cognitive issues residually. He denies weakness. He is taking Atorvastatin, Amlodipine, Metoprolol, Isosorbide, ASA and Plavix as prescribed.  HTN: Controlled on Amlodipine and Metoprolol. ECG from 04/2018 reviewed.   He also c/o discoloration to the left side of his face. He noticed this a few weeks ago. He denies any fall or injury to the area. He has not taken anything OTC for his symptoms.    Past Medical History:  Diagnosis Date  . Allergic rhinitis   . Benign prostatic hypertrophy   . Coronary artery disease CARDIOLOGIST -- DR Daneen Schick  . Duodenal diverticulum 2003  . GERD (gastroesophageal reflux disease)   . H/O hiatal hernia 2003  . Heart murmur   . Hepatic lesion 2004  . History of melanoma excision    FACE  . Hypercholesteremia   . Hypertension   . Left carotid stenosis    > 50%  PER DUPLEX  03-28-2011  . Mild mitral regurgitation   . Perianal fistula   . S/P CABG x 4    1988  . S/P drug eluting coronary stent placement    POST CABG--  STENTING 2000  AND RESTENTING 2006 FOR REINSTENT STENOSIS  . Stroke [facility])     Current Outpatient Medications  Medication Sig Dispense Refill  . amLODipine (NORVASC) 2.5 MG tablet TAKE 1 TABLET BY MOUTH EVERY DAY 90 tablet 2  . aspirin EC 81 MG EC tablet Take 1 tablet (81 mg total) by mouth  daily. 30 tablet 0  . atorvastatin (LIPITOR) 80 MG tablet TAKE 1 TABLET BY MOUTH EVERYDAY AT BEDTIME 90 tablet 2  . clopidogrel (PLAVIX) 75 MG tablet TAKE 1 TABLET BY MOUTH EVERY DAY 90 tablet 0  . cyanocobalamin (,VITAMIN B-12,) 1000 MCG/ML injection Inject 1 mL (1,000 mcg total) into the muscle every 30 (thirty) days. 1 mL 12  . isosorbide mononitrate (IMDUR) 30 MG 24 hr tablet TAKE 1 TABLET BY MOUTH EVERY DAY 90 tablet 1  . ketoconazole (NIZORAL) 2 % cream Apply 1 application topically 2 (two) times daily. 60 g 1  . loteprednol (LOTEMAX) 0.2 % SUSP 1 drop as needed.    . metoprolol succinate (TOPROL-XL) 25 MG 24 hr tablet TAKE 1 TABLET (25 MG TOTAL) BY MOUTH DAILY. TAKE WITH OR IMMEDIATELY FOLLOWING A MEAL. 90 tablet 0  . nitroGLYCERIN (NITROSTAT) 0.4 MG SL tablet Place 1 tablet (0.4 mg total) under the tongue every 5 (five) minutes as needed for chest pain. 30 tablet 0  . pantoprazole (PROTONIX) 40 MG tablet Take 1 tablet (40 mg total) by mouth daily. 90 tablet 3  . polyethylene glycol (MIRALAX / GLYCOLAX) packet Take 17 g by mouth daily.     No current facility-administered medications for this visit.    Allergies  Allergen Reactions  . Adhesive [Tape]  Rash    ONLY USE PAPER TAPE  . Iodine Rash    Rash when applied to skin  . Niacin And Related Hives and Other (See Comments)    FLUSHING    Family History  Problem Relation Age of Onset  . Breast cancer Mother   . Stroke Father   . Colon cancer Brother     Social History   Socioeconomic History  . Marital status: Widowed    Spouse name: [name]  . Number of children: 3  . Years of education: B.D.  . Highest education level: Not on file  Occupational History  . Occupation: retired  Tobacco Use  . Smoking status: Never Smoker  . Smokeless tobacco: Never Used  Substance and Sexual Activity  . Alcohol use: No  . Drug use: No  . Sexual activity: Not Currently  Other Topics Concern  . Not on file  Social History  Narrative   Lives alone, at [facility].  Has some help with housecleaning but o/w he is independent.     Was married to first wife 6 years, then to second wife 12 years.  Widowed twice.        Likes to travel.     Retired Theme park manager, [employer]   One son was killed in [place] in 1984   One son and one daughter (she is an Therapist, sports) still alive in 2019   Social Determinants of Health   Financial Resource Strain:   . Difficulty of Paying Living Expenses: Not on file  Food Insecurity:   . Worried About Charity fundraiser in the Last Year: Not on file  . Ran Out of Food in the Last Year: Not on file  Transportation Needs:   . Lack of Transportation (Medical): Not on file  . Lack of Transportation (Non-Medical): Not on file  Physical Activity:   . Days of Exercise per Week: Not on file  . Minutes of Exercise per Session: Not on file  Stress:   . Feeling of Stress : Not on file  Social Connections:   . Frequency of Communication with Friends and Family: Not on file  . Frequency of Social Gatherings with Friends and Family: Not on file  . Attends Religious Services: Not on file  . Active Member of Clubs or Organizations: Not on file  . Attends Archivist Meetings: Not on file  . Marital Status: Not on file  Intimate Partner Violence:   . Fear of Current or Ex-Partner: Not on file  . Emotionally Abused: Not on file  . Physically Abused: Not on file  . Sexually Abused: Not on file    Hospitiliaztions: None  Health Maintenance:    Flu: 11/2018  Tetanus: 09/2015  Pneumovax: 02/2013  Prevnar: 03/2014  Zostavax: 02/2013  COVID 19: 02/2019  Colon Screening: no longer screening  Eye Doctor: annually  Dental Exam: annually   Providers:   PCP: Elsie Stain, MD  Cardiologist: Dr. Saunders Revel  Ophthalmology: Dr. Wallace Going.   I have personally reviewed and have noted:  1. The patient's medical and social history 2. Their use of alcohol, tobacco or illicit drugs 3. Their  current medications and supplements 4. The patient's functional ability including ADL's, fall risks, home safety risks and  hearing or visual impairment. 5. Diet and physical activities 6. Evidence for depression or mood disorder  Subjective:   Review of Systems:   Constitutional: Denies fever, malaise, fatigue, headache or abrupt weight changes.  HEENT: Denies eye  pain, eye redness, ear pain, ringing in the ears, wax buildup, runny nose, nasal congestion, bloody nose, or sore throat. Respiratory: Denies difficulty breathing, shortness of breath, cough or sputum production.   Cardiovascular: Denies chest pain, chest tightness, palpitations or swelling in the hands or feet.  Gastrointestinal: Pt reports reflux. Denies abdominal pain, bloating, constipation, diarrhea or blood in the stool.  GU: Denies urgency, frequency, pain with urination, burning sensation, blood in urine, odor or discharge. Musculoskeletal: Denies decrease in range of motion, difficulty with gait, muscle pain or joint pain and swelling.  Skin: Pt reports discoloration of left cheek. Denies redness, rashes, lesions or ulcercations.  Neurological: Pt reports trouble with recall. Denies dizziness, difficulty with speech or problems with balance and coordination.  Psych: Denies anxiety, depression, SI/HI.  No other specific complaints in a complete review of systems (except as listed in HPI above).  Objective:  PE:  BP (!) 143/72   Pulse 77   Wt 179 lb 8 oz (81.4 kg)   BMI 30.81 kg/m  .  Wt Readings from Last 3 Encounters:  06/01/18 175 lb (79.4 kg)  04/23/18 184 lb 7 oz (83.7 kg)  04/17/18 180 lb 12 oz (82 kg)    General: Appears his stated age, well developed, well nourished in NAD. Skin: Warm, dry and intact. Patch of eczema noted of left cheek. HEENT: Head: normal shape and size; Eyes: sclera white, no icterus, conjunctiva pink;  Neck: Neck supple, trachea midline. No masses, lumps or thyromegaly present.   Cardiovascular: Normal rate and rhythm. S1,S2 noted.  Murmur noted. Trace BLE edema.  Pulmonary/Chest: Normal effort and positive vesicular breath sounds. No respiratory distress. No wheezes, rales or ronchi noted.  Abdomen: Soft and nontender. Normal bowel sounds. No distention or masses noted.  Musculoskeletal: Gait slow and steady without device. Neurological: Alert and oriented.  Psychiatric: Mood and affect normal. Behavior is normal. Judgment and thought content normal.     BMET    Component Value Date/Time   NA 135 04/01/2018 0520   K 3.9 01/01/2019 0000   CL 106 04/01/2018 0520   CO2 25 04/01/2018 0520   GLUCOSE 104 (H) 04/01/2018 0520   BUN 11 04/01/2018 0520   CREATININE 1.1 12/31/2018 0000   CREATININE 0.92 04/01/2018 0520   CALCIUM 8.4 (L) 04/01/2018 0520   GFRNONAA >60 04/01/2018 0520   GFRAA >60 04/01/2018 0520    Lipid Panel     Component Value Date/Time   CHOL 137 03/31/2018 0552   CHOL 138 12/16/2012 0837   TRIG 64 03/31/2018 0552   HDL 42 03/31/2018 0552   HDL 46 12/16/2012 0837   CHOLHDL 3.3 03/31/2018 0552   VLDL 13 03/31/2018 0552   LDLCALC 82 03/31/2018 0552   LDLCALC 75 12/16/2012 0837    CBC    Component Value Date/Time   WBC 8.1 04/02/2018 0341   RBC 4.12 (L) 04/02/2018 0341   HGB 14.1 12/31/2018 0000   HCT 38.1 (L) 04/02/2018 0341   PLT 174 04/02/2018 0341   MCV 92.5 04/02/2018 0341   MCH 30.6 04/02/2018 0341   MCHC 33.1 04/02/2018 0341   RDW 12.6 04/02/2018 0341   LYMPHSABS 1.6 03/31/2018 0223   MONOABS 0.7 03/31/2018 0223   EOSABS 0.2 03/31/2018 0223   BASOSABS 0.1 03/31/2018 0223    Hgb A1C Lab Results  Component Value Date   HGBA1C 5.8 (H) 05/27/2013      Assessment and Plan:   Medicare Annual Wellness Visit:  Diet: He  does eat meat. He consumes fruits and veggies daily. He rarely eats fried foods. He drinks mostly coffee and water. Physical activity: He walks 45 minutes daily when he can Depression/mood screen:  Negative, PHQ 9 score of 0 Hearing: Intact to whispered voice Visual acuity: Grossly normal, wears glasses, performs annual eye exam  ADLs: Capable Fall risk: None Home safety: Good Cognitive evaluation: Trouble with recall. Intact to orientation, naming, and repetition. MMSE score of 20- stable EOL planning: Adv directives, DNR/ I agree  Preventative Medicine: Flu, tetanus, pneumovax, prevnar and zostovax UTD. He declines shingrix. He has had his first COVID vaccine, will be getting his second dose next week. He no longer wishes to screen for prostate or colon cancer. Encouraged him to consume a balanced diet and exercise regimen. Advised him to see an eye doctor and dentist annually. No labs drawn today. Due dates for screening exams given to patient as part of his AVS.   Next appointment: Will defer to PCP, pt now in [place], NP

## 2019-03-10 NOTE — Assessment & Plan Note (Signed)
Continue Atorvastatin, ASA, Plavix Encouraged low fat diet

## 2019-03-10 NOTE — Assessment & Plan Note (Signed)
Continue Atorvastatin, Amlodipine, ASA, Plavix, Isosorbide and Metoprolol Has Nitro prn if needed

## 2019-03-10 NOTE — Assessment & Plan Note (Signed)
No angina Continue Atorvastatin, Amlodipine, ASA, Plavix, Isosorbide and Metoprolol Has Nitro prn if needed

## 2019-03-21 ENCOUNTER — Encounter: Payer: Self-pay | Admitting: Family Medicine

## 2019-03-21 DIAGNOSIS — Z66 Do not resuscitate: Secondary | ICD-10-CM | POA: Insufficient documentation

## 2019-03-22 DIAGNOSIS — Z23 Encounter for immunization: Secondary | ICD-10-CM | POA: Diagnosis not present

## 2019-03-26 ENCOUNTER — Other Ambulatory Visit: Payer: Self-pay | Admitting: Family Medicine

## 2019-03-26 NOTE — Telephone Encounter (Signed)
LOV with Webb Silversmith, NP at [facility] for [employer].   Plavix last filled on 10/02/2018 #90 with 0 refill. Nitroglycerin last filled on 04/02/2018 #30 with 0 refill

## 2019-03-26 NOTE — Telephone Encounter (Signed)
Refills are handled through the nurse at [facility].

## 2019-03-29 NOTE — Telephone Encounter (Signed)
See below.  I went ahead and sent these refills so they would be at the pharmacy because I did not want him to run out.  Thanks.

## 2019-05-11 ENCOUNTER — Other Ambulatory Visit: Payer: Self-pay | Admitting: Family Medicine

## 2019-05-12 ENCOUNTER — Other Ambulatory Visit: Payer: Self-pay | Admitting: Family Medicine

## 2019-05-26 ENCOUNTER — Telehealth: Payer: Self-pay | Admitting: Internal Medicine

## 2019-05-26 NOTE — Telephone Encounter (Signed)
3 attempts to schedule fu appt from recall list.   Deleting recall.   

## 2019-06-03 ENCOUNTER — Other Ambulatory Visit: Payer: Self-pay | Admitting: Family Medicine

## 2019-06-17 ENCOUNTER — Encounter: Payer: Self-pay | Admitting: Internal Medicine

## 2019-06-17 DIAGNOSIS — E785 Hyperlipidemia, unspecified: Secondary | ICD-10-CM | POA: Diagnosis not present

## 2019-06-17 DIAGNOSIS — I214 Non-ST elevation (NSTEMI) myocardial infarction: Secondary | ICD-10-CM | POA: Diagnosis not present

## 2019-06-17 DIAGNOSIS — D519 Vitamin B12 deficiency anemia, unspecified: Secondary | ICD-10-CM | POA: Diagnosis not present

## 2019-06-17 DIAGNOSIS — G3184 Mild cognitive impairment, so stated: Secondary | ICD-10-CM | POA: Diagnosis not present

## 2019-06-17 DIAGNOSIS — I10 Essential (primary) hypertension: Secondary | ICD-10-CM | POA: Diagnosis not present

## 2019-06-17 DIAGNOSIS — I2510 Atherosclerotic heart disease of native coronary artery without angina pectoris: Secondary | ICD-10-CM | POA: Diagnosis not present

## 2019-06-19 ENCOUNTER — Other Ambulatory Visit: Payer: Self-pay | Admitting: Family Medicine

## 2019-07-15 ENCOUNTER — Encounter: Payer: Self-pay | Admitting: Internal Medicine

## 2019-07-15 ENCOUNTER — Ambulatory Visit: Payer: Medicare Other | Admitting: Internal Medicine

## 2019-07-15 ENCOUNTER — Other Ambulatory Visit: Payer: Self-pay

## 2019-07-15 VITALS — BP 147/73 | HR 62 | Temp 98.1°F | Resp 20 | Wt 192.6 lb

## 2019-07-15 DIAGNOSIS — K219 Gastro-esophageal reflux disease without esophagitis: Secondary | ICD-10-CM

## 2019-07-15 DIAGNOSIS — I25119 Atherosclerotic heart disease of native coronary artery with unspecified angina pectoris: Secondary | ICD-10-CM

## 2019-07-15 DIAGNOSIS — I10 Essential (primary) hypertension: Secondary | ICD-10-CM

## 2019-07-15 DIAGNOSIS — G3184 Mild cognitive impairment, so stated: Secondary | ICD-10-CM | POA: Diagnosis not present

## 2019-07-15 DIAGNOSIS — N400 Enlarged prostate without lower urinary tract symptoms: Secondary | ICD-10-CM | POA: Diagnosis not present

## 2019-07-15 NOTE — Assessment & Plan Note (Signed)
No major symptoms now

## 2019-07-15 NOTE — Assessment & Plan Note (Signed)
BP Readings from Last 3 Encounters:  07/15/19 (!) 147/73  03/10/19 (!) 143/72  01/25/19 138/70   Reasonable control for age

## 2019-07-15 NOTE — Progress Notes (Signed)
Subjective:    Patient ID: Jason Velazquez, male    DOB: 1928-12-25, 86 y.o.   MRN: [id number]  HPI Transferring care for on site care here at [facility] assisted living Reviewed status with Luellen Pucker RN  Has heart disease Past MI No chest pain at rest--but has rare exertional chest pain-----took nitro once No SOB Notices some edema in feet/ankles in AM--then improves Non palpitations Sleeps fairly flat in bed--no   Heartburn just once in a while No dysphagia  Mild memory issues noted by staff He just notes he is not as sharp  Voids with adequate stream No daytime issues  Current Outpatient Medications on File Prior to Visit  Medication Sig Dispense Refill  . amLODipine (NORVASC) 2.5 MG tablet TAKE 1 TABLET BY MOUTH EVERY DAY 90 tablet 1  . aspirin EC 81 MG EC tablet Take 1 tablet (81 mg total) by mouth daily. 30 tablet 0  . atorvastatin (LIPITOR) 80 MG tablet TAKE 1 TABLET BY MOUTH EVERYDAY AT BEDTIME 90 tablet 2  . clopidogrel (PLAVIX) 75 MG tablet TAKE 1 TABLET BY MOUTH EVERY DAY 90 tablet 3  . cyanocobalamin (,VITAMIN B-12,) 1000 MCG/ML injection INJECT 1 ML (1,000 MCG TOTAL) INTO THE MUSCLE EVERY 30 (THIRTY) DAYS. 3 mL 4  . isosorbide mononitrate (IMDUR) 30 MG 24 hr tablet TAKE 1 TABLET BY MOUTH EVERY DAY 90 tablet 1  . ketoconazole (NIZORAL) 2 % cream Apply 1 application topically 2 (two) times daily. 60 g 1  . loteprednol (LOTEMAX) 0.2 % SUSP 1 drop as needed.    . metoprolol succinate (TOPROL-XL) 25 MG 24 hr tablet TAKE 1 TABLET (25 MG TOTAL) BY MOUTH DAILY. TAKE WITH OR IMMEDIATELY FOLLOWING A MEAL. 90 tablet 0  . nitroGLYCERIN (NITROSTAT) 0.4 MG SL tablet PLACE 1 TABLET (0.4 MG TOTAL) UNDER THE TONGUE EVERY 5 (FIVE) MINUTES AS NEEDED FOR CHEST PAIN. 25 tablet 5  . pantoprazole (PROTONIX) 40 MG tablet Take 1 tablet (40 mg total) by mouth daily. 90 tablet 3  . polyethylene glycol (MIRALAX / GLYCOLAX) packet Take 17 g by mouth daily.     No current facility-administered  medications on file prior to visit.    Allergies  Allergen Reactions  . Adhesive [Tape] Rash    ONLY USE PAPER TAPE  . Iodine Rash    Rash when applied to skin  . Niacin And Related Hives and Other (See Comments)    FLUSHING    Past Medical History:  Diagnosis Date  . Allergic rhinitis   . Benign prostatic hypertrophy   . Coronary artery disease CARDIOLOGIST -- DR Daneen Schick  . Duodenal diverticulum 2003  . GERD (gastroesophageal reflux disease)   . H/O hiatal hernia 2003  . Heart murmur   . Hepatic lesion 2004  . History of melanoma excision    FACE  . Hypercholesteremia   . Hypertension   . Left carotid stenosis    > 50%  PER DUPLEX  03-28-2011  . Mild mitral regurgitation   . Perianal fistula   . S/P CABG x 4    1988  . S/P drug eluting coronary stent placement    POST CABG--  STENTING 2000  AND RESTENTING 2006 FOR REINSTENT STENOSIS  . Stroke [facility])     Past Surgical History:  Procedure Laterality Date  . CATARACT EXTRACTION W/ INTRAOCULAR LENS  IMPLANT, BILATERAL    . CORONARY ANGIOPLASTY WITH STENT PLACEMENT  2000   PCI AND STENTING CIRCUMFLEX  . CORONARY ANGIOPLASTY  WITH STENT PLACEMENT  04-30-2004  DR Daneen Schick   RE-INSTENT STENOSIS/  DRUG-ELUTING STENT OF PROXIMAL AND MID CIRCUMFLEX/ PCI MID CIRCUMFLEX THROUGH STENT STRUT/  WIDELY PATENT SAPHENOUS VEIN GRAFT AND  LIMA TO LAD GRAFT/ TOTAL OCCLUSION RIGHT CORONARY BEYOND THE POSTERIOR DESCENDING ARTERY BRANCH WITH 50% OSTIAL NARROWING/ TOTAL OCCLUSION OF LAD AT THE OSTIUM OF THE LEFT MAIN  . CORONARY ARTERY BYPASS GRAFT  1988   X5  . EVALUATION UNDER ANESTHESIA WITH ANAL FISTULECTOMY N/A 07/22/2012   Procedure: EXAM UNDER ANESTHESIA WITH ANAL fistulotomy;  Surgeon: Leighton Ruff, MD;  Location: [facility];  Service: General;  Laterality: N/A;  . LEFT HEART CATH AND CORS/GRAFTS ANGIOGRAPHY N/A 03/31/2018   Procedure: LEFT HEART CATH AND CORS/GRAFTS ANGIOGRAPHY;  Surgeon: Nelva Bush, MD;   Location: [place] [facility];  Service: Cardiovascular;  Laterality: N/A;  . SHOULDER ARTHROSCOPY WITH OPEN ROTATOR CUFF REPAIR Right 07-10-1999  . TONSILLECTOMY AND ADENOIDECTOMY  1944  . TRANSTHORACIC ECHOCARDIOGRAM  03-01-2011  DR Daneen Schick   NORMAL LVF AND LV SIZE/ MILD LEFT ATRIAL ENLARGEMENT/ GRADE II DIASTOLIC DYSFUNCTION WITH ELEVATED LEFT ATRIAL PRESSURE/ MILD TO MODERATE MR/ MILD TR  . TRANSURETHRAL RESECTION OF PROSTATE  04/09/2011   Procedure: TRANSURETHRAL RESECTION OF THE PROSTATE WITH GYRUS INSTRUMENTS;  Surgeon: Malka So, MD;  Location: [facility];  Service: Urology;  Laterality: N/A;    Family History  Problem Relation Age of Onset  . Breast cancer Mother   . Stroke Father   . Colon cancer Brother     Social History   Socioeconomic History  . Marital status: Widowed    Spouse name: Jason Velazquez  . Number of children: 3  . Years of education: B.D.  . Highest education level: Not on file  Occupational History  . Occupation: Land    Comment: Retired  Tobacco Use  . Smoking status: Never Smoker  . Smokeless tobacco: Never Used  Substance and Sexual Activity  . Alcohol use: No  . Drug use: No  . Sexual activity: Not Currently  Other Topics Concern  . Not on file  Social History Narrative   Lives alone, at [facility].  Has some help with housecleaning but o/w he is independent.     Was married to first wife 27 years, then to second wife 12 years.  Widowed twice.        Retired Theme park manager, [employer]   One son was killed in [place] in 1984   One son and one daughter (she is an Therapist, sports) still alive in       Has living will   Daughter is health care POA   Has DNR    No feeding tube if cognitively unaware   Social Determinants of Radio broadcast assistant Strain:   . Difficulty of Paying Living Expenses:   Food Insecurity:   . Worried About Charity fundraiser in the Last Year:   . Arboriculturist in the Last Year:   Transportation  Needs:   . Film/video editor (Medical):   Marland Kitchen Lack of Transportation (Non-Medical):   Physical Activity:   . Days of Exercise per Week:   . Minutes of Exercise per Session:   Stress:   . Feeling of Stress :   Social Connections:   . Frequency of Communication with Friends and Family:   . Frequency of Social Gatherings with Friends and Family:   . Attends Religious Services:   .  Active Member of Clubs or Organizations:   . Attends Archivist Meetings:   Marland Kitchen Marital Status:   Intimate Partner Violence:   . Fear of Current or Ex-Partner:   . Emotionally Abused:   Marland Kitchen Physically Abused:   . Sexually Abused:    Review of Systems Sleeps well. Nocturia x 1 usually Appetite is good Weight is going up some Bowels okay with daily miralax No sig back or joint pain     Objective:   Physical Exam  Constitutional: He appears well-developed. No distress.  Neck: No thyromegaly present.  Cardiovascular: Normal rate, regular rhythm and normal heart sounds. Exam reveals no gallop.  No murmur heard. Respiratory: Effort normal and breath sounds normal. No respiratory distress. He has no wheezes. He has no rales.  GI: Soft. There is no abdominal tenderness.  Musculoskeletal:        General: No edema.  Lymphadenopathy:    He has no cervical adenopathy.  Skin:  Seborrheic rash on forehead--will give 2.5% HC lotion  Psychiatric: He has a normal mood and affect. His behavior is normal.           Assessment & Plan:

## 2019-07-15 NOTE — Assessment & Plan Note (Signed)
Minimal memory/recall issues Independent with ADLs

## 2019-07-15 NOTE — Assessment & Plan Note (Signed)
Controlled with PPI

## 2019-07-15 NOTE — Assessment & Plan Note (Signed)
Rare angina on imdur Has nitro for rare use No CHF On beta blocker also, plavix, statin

## 2019-07-21 ENCOUNTER — Other Ambulatory Visit: Payer: Self-pay | Admitting: Family Medicine

## 2019-07-21 NOTE — Telephone Encounter (Signed)
Need to check with Jason Velazquez Then send 1 year

## 2019-07-21 NOTE — Telephone Encounter (Signed)
Do you know if he is getting this on his own or through [facility] now?

## 2019-07-22 NOTE — Telephone Encounter (Signed)
Spoke to [place]. He is getting them through [facility]. I sent in the refills for 90 days as requested

## 2019-11-03 ENCOUNTER — Other Ambulatory Visit: Payer: Self-pay

## 2019-11-03 ENCOUNTER — Ambulatory Visit: Payer: Medicare Other | Admitting: Internal Medicine

## 2019-11-03 ENCOUNTER — Encounter: Payer: Self-pay | Admitting: Internal Medicine

## 2019-11-03 VITALS — BP 182/84 | HR 62 | Temp 97.6°F | Resp 18 | Wt 194.0 lb

## 2019-11-03 DIAGNOSIS — G3184 Mild cognitive impairment, so stated: Secondary | ICD-10-CM | POA: Diagnosis not present

## 2019-11-03 DIAGNOSIS — E785 Hyperlipidemia, unspecified: Secondary | ICD-10-CM

## 2019-11-03 DIAGNOSIS — K219 Gastro-esophageal reflux disease without esophagitis: Secondary | ICD-10-CM

## 2019-11-03 DIAGNOSIS — I25119 Atherosclerotic heart disease of native coronary artery with unspecified angina pectoris: Secondary | ICD-10-CM | POA: Diagnosis not present

## 2019-11-03 DIAGNOSIS — N400 Enlarged prostate without lower urinary tract symptoms: Secondary | ICD-10-CM | POA: Diagnosis not present

## 2019-11-03 DIAGNOSIS — I10 Essential (primary) hypertension: Secondary | ICD-10-CM | POA: Diagnosis not present

## 2019-11-03 NOTE — Assessment & Plan Note (Signed)
Voids fine off meds

## 2019-11-03 NOTE — Assessment & Plan Note (Signed)
No chest pain Continue Amlodipine, ASA, Atorvastatin, Plavix, Imdur and Metoprolol

## 2019-11-03 NOTE — Patient Instructions (Signed)

## 2019-11-03 NOTE — Assessment & Plan Note (Signed)
Continue Pantoprazole.

## 2019-11-03 NOTE — Assessment & Plan Note (Signed)
Controlled on Metoprolol and Amlodipine Will monitor

## 2019-11-03 NOTE — Assessment & Plan Note (Signed)
Continue Atorvastatin

## 2019-11-03 NOTE — Assessment & Plan Note (Signed)
Appreciate ALF care

## 2019-11-03 NOTE — Progress Notes (Signed)
Subjective:    Patient ID: Jason Velazquez, male    DOB: Aug 25, 1928, 86 y.o.   MRN: [id number]  HPI  Resident seen in [address] for routine follow up. No new concerns from staff or resident. He is sleeping well. He is independent with ADL's. He walks without a device. His appetite is good, weight is stable. His bowels are moving normal with Mirilax. He denies urinary incontinence. He denies pain, chest pain, reflux or SOB.  BPH: He denies urinary issues at this time.  HLD with CAD s/p CABG/Stroke: Managed on Amlodipine, ASA, Atorvastatin, Plavix, Imdur and Metoprolol.  GERD: He denies breakthrough on Pantoprazole. He denies swallowing issues.  HTN: His BP today is 128/84. He is taking Metoprolol and Amlodipine as prescribed.   Review of Systems      Past Medical History:  Diagnosis Date  . Allergic rhinitis   . Benign prostatic hypertrophy   . Coronary artery disease CARDIOLOGIST -- DR Daneen Schick  . Duodenal diverticulum 2003  . GERD (gastroesophageal reflux disease)   . H/O hiatal hernia 2003  . Heart murmur   . Hepatic lesion 2004  . History of melanoma excision    FACE  . Hypercholesteremia   . Hypertension   . Left carotid stenosis    > 50%  PER DUPLEX  03-28-2011  . Mild mitral regurgitation   . Perianal fistula   . S/P CABG x 4    1988  . S/P drug eluting coronary stent placement    POST CABG--  STENTING 2000  AND RESTENTING 2006 FOR REINSTENT STENOSIS  . Stroke [facility])     Current Outpatient Medications  Medication Sig Dispense Refill  . amLODipine (NORVASC) 2.5 MG tablet TAKE 1 TABLET BY MOUTH EVERY DAY 90 tablet 1  . aspirin EC 81 MG EC tablet Take 1 tablet (81 mg total) by mouth daily. 30 tablet 0  . atorvastatin (LIPITOR) 80 MG tablet TAKE 1 TABLET BY MOUTH EVERYDAY AT BEDTIME 90 tablet 2  . clopidogrel (PLAVIX) 75 MG tablet TAKE 1 TABLET BY MOUTH EVERY DAY 90 tablet 3  . cyanocobalamin (,VITAMIN B-12,) 1000 MCG/ML injection INJECT 1 ML (1,000 MCG TOTAL)  INTO THE MUSCLE EVERY 30 (THIRTY) DAYS. 3 mL 4  . hydrocortisone 2.5 % lotion Apply 1 application topically 2 (two) times daily as needed.    . isosorbide mononitrate (IMDUR) 30 MG 24 hr tablet TAKE 1 TABLET BY MOUTH EVERY DAY 90 tablet 3  . ketoconazole (NIZORAL) 2 % cream Apply 1 application topically 2 (two) times daily. 60 g 1  . loteprednol (LOTEMAX) 0.2 % SUSP 1 drop as needed.    . metoprolol succinate (TOPROL-XL) 25 MG 24 hr tablet TAKE 1 TABLET (25 MG TOTAL) BY MOUTH DAILY. TAKE WITH OR IMMEDIATELY FOLLOWING A MEAL. 90 tablet 3  . nitroGLYCERIN (NITROSTAT) 0.4 MG SL tablet PLACE 1 TABLET (0.4 MG TOTAL) UNDER THE TONGUE EVERY 5 (FIVE) MINUTES AS NEEDED FOR CHEST PAIN. 25 tablet 5  . pantoprazole (PROTONIX) 40 MG tablet Take 1 tablet (40 mg total) by mouth daily. 90 tablet 3  . polyethylene glycol (MIRALAX / GLYCOLAX) packet Take 17 g by mouth daily.     No current facility-administered medications for this visit.    Allergies  Allergen Reactions  . Adhesive [Tape] Rash    ONLY USE PAPER TAPE  . Iodine Rash    Rash when applied to skin  . Niacin And Related Hives and Other (See Comments)  FLUSHING    Family History  Problem Relation Age of Onset  . Breast cancer Mother   . Stroke Father   . Colon cancer Brother     Social History   Socioeconomic History  . Marital status: Widowed    Spouse name: [name]  . Number of children: 3  . Years of education: B.D.  . Highest education level: Not on file  Occupational History  . Occupation: Land    Comment: Retired  Tobacco Use  . Smoking status: Never Smoker  . Smokeless tobacco: Never Used  Vaping Use  . Vaping Use: Never used  Substance and Sexual Activity  . Alcohol use: No  . Drug use: No  . Sexual activity: Not Currently  Other Topics Concern  . Not on file  Social History Narrative   L[facility] alone, at [facility].  Has some help with housecleaning but o/w he is independent.     Was married to  first wife 24 years, then to second wife 12 years.  Widowed twice.        Retired Theme park manager, [employer]   One son was killed in [place] in 1984   One son and one daughter (she is an Therapist, sports) still alive in       Has living will   Daughter is health care POA   Has DNR    No feeding tube if cognitively unaware   Social Determinants of Health   Financial Resource Strain:   . Difficulty of Paying Living Expenses: Not on file  Food Insecurity:   . Worried About Charity fundraiser in the Last Year: Not on file  . Ran Out of Food in the Last Year: Not on file  Transportation Needs:   . Lack of Transportation (Medical): Not on file  . Lack of Transportation (Non-Medical): Not on file  Physical Activity:   . Days of Exercise per Week: Not on file  . Minutes of Exercise per Session: Not on file  Stress:   . Feeling of Stress : Not on file  Social Connections:   . Frequency of Communication with Friends and Family: Not on file  . Frequency of Social Gatherings with Friends and Family: Not on file  . Attends Religious Services: Not on file  . Active Member of Clubs or Organizations: Not on file  . Attends Archivist Meetings: Not on file  . Marital Status: Not on file  Intimate Partner Violence:   . Fear of Current or Ex-Partner: Not on file  . Emotionally Abused: Not on file  . Physically Abused: Not on file  . Sexually Abused: Not on file     Constitutional: Denies fever, malaise, fatigue, headache or abrupt weight changes.  HEENT: Denies eye pain, eye redness, ear pain, ringing in the ears, wax buildup, runny nose, nasal congestion, bloody nose, or sore throat. Respiratory: Denies difficulty breathing, shortness of breath, cough or sputum production.   Cardiovascular: Denies chest pain, chest tightness, palpitations or swelling in the hands or feet.  Gastrointestinal: Pt reports reflux. Denies abdominal pain, bloating, constipation, diarrhea or blood in the stool.    GU: Denies urgency, frequency, pain with urination, burning sensation, blood in urine, odor or discharge. Musculoskeletal: Denies decrease in range of motion, difficulty with gait, muscle pain or joint pain and swelling.  Skin: Denies redness, rashes, lesions or ulcercations.  Neurological: Denies dizziness, difficulty with memory, difficulty with speech or problems with balance and coordination.  Psych:  Denies anxiety, depression, SI/HI.  No other specific complaints in a complete review of systems (except as listed in HPI above).  Objective:   Physical Exam  BP (!) 182/84   Pulse 62   Temp 97.6 F (36.4 C)   Resp 18   Wt 194 lb (88 kg)   BMI 33.30 kg/m   Wt Readings from Last 3 Encounters:  07/15/19 192 lb 9.6 oz (87.4 kg)  03/10/19 179 lb 8 oz (81.4 kg)  06/01/18 175 lb (79.4 kg)    General: Appears his stated age, well developed, well nourished in NAD. Skin: Warm, dry and intact. PVD changes noted of BLE. HEENT: Head: normal shape and size;  Neck:  Neck supple, trachea midline. No masses, lumps or thyromegaly present.  Cardiovascular: Normal rate and rhythm. S1,S2 noted.  No murmur, rubs or gallops noted. Trace BLE edema. Pulmonary/Chest: Normal effort and positive vesicular breath sounds. No respiratory distress. No wheezes, rales or ronchi noted.  Abdomen: Soft and nontender. Normal bowel sounds.  Musculoskeletal: No difficulty with gait.  Neurological: Alert and oriented.  Psychiatric: Mood and affect normal.   BMET    Component Value Date/Time   NA 135 04/01/2018 0520   K 3.9 01/01/2019 0000   CL 106 04/01/2018 0520   CO2 25 04/01/2018 0520   GLUCOSE 104 (H) 04/01/2018 0520   BUN 11 04/01/2018 0520   CREATININE 1.1 12/31/2018 0000   CREATININE 0.92 04/01/2018 0520   CALCIUM 8.4 (L) 04/01/2018 0520   GFRNONAA >60 04/01/2018 0520   GFRAA >60 04/01/2018 0520    Lipid Panel     Component Value Date/Time   CHOL 137 03/31/2018 0552   CHOL 138 12/16/2012  0837   TRIG 64 03/31/2018 0552   HDL 42 03/31/2018 0552   HDL 46 12/16/2012 0837   CHOLHDL 3.3 03/31/2018 0552   VLDL 13 03/31/2018 0552   LDLCALC 82 03/31/2018 0552   LDLCALC 75 12/16/2012 0837    CBC    Component Value Date/Time   WBC 8.1 04/02/2018 0341   RBC 4.12 (L) 04/02/2018 0341   HGB 14.1 12/31/2018 0000   HCT 38.1 (L) 04/02/2018 0341   PLT 174 04/02/2018 0341   MCV 92.5 04/02/2018 0341   MCH 30.6 04/02/2018 0341   MCHC 33.1 04/02/2018 0341   RDW 12.6 04/02/2018 0341   LYMPHSABS 1.6 03/31/2018 0223   MONOABS 0.7 03/31/2018 0223   EOSABS 0.2 03/31/2018 0223   BASOSABS 0.1 03/31/2018 0223    Hgb A1C Lab Results  Component Value Date   HGBA1C 5.8 (H) 05/27/2013           Assessment & Plan:    Webb Silversmith, NP

## 2019-11-30 DIAGNOSIS — Z23 Encounter for immunization: Secondary | ICD-10-CM | POA: Diagnosis not present

## 2019-12-06 ENCOUNTER — Other Ambulatory Visit: Payer: Self-pay | Admitting: Family Medicine

## 2019-12-14 DIAGNOSIS — D225 Melanocytic nevi of trunk: Secondary | ICD-10-CM | POA: Diagnosis not present

## 2019-12-14 DIAGNOSIS — Z85820 Personal history of malignant melanoma of skin: Secondary | ICD-10-CM | POA: Diagnosis not present

## 2019-12-14 DIAGNOSIS — D2262 Melanocytic nevi of left upper limb, including shoulder: Secondary | ICD-10-CM | POA: Diagnosis not present

## 2019-12-14 DIAGNOSIS — D485 Neoplasm of uncertain behavior of skin: Secondary | ICD-10-CM | POA: Diagnosis not present

## 2019-12-14 DIAGNOSIS — D0439 Carcinoma in situ of skin of other parts of face: Secondary | ICD-10-CM | POA: Diagnosis not present

## 2019-12-14 DIAGNOSIS — Z85828 Personal history of other malignant neoplasm of skin: Secondary | ICD-10-CM | POA: Diagnosis not present

## 2019-12-14 DIAGNOSIS — X32XXXA Exposure to sunlight, initial encounter: Secondary | ICD-10-CM | POA: Diagnosis not present

## 2019-12-14 DIAGNOSIS — L570 Actinic keratosis: Secondary | ICD-10-CM | POA: Diagnosis not present

## 2019-12-14 DIAGNOSIS — D2271 Melanocytic nevi of right lower limb, including hip: Secondary | ICD-10-CM | POA: Diagnosis not present

## 2019-12-16 ENCOUNTER — Encounter: Payer: Self-pay | Admitting: Internal Medicine

## 2019-12-16 DIAGNOSIS — E785 Hyperlipidemia, unspecified: Secondary | ICD-10-CM | POA: Diagnosis not present

## 2019-12-16 DIAGNOSIS — I10 Essential (primary) hypertension: Secondary | ICD-10-CM | POA: Diagnosis not present

## 2019-12-16 DIAGNOSIS — E538 Deficiency of other specified B group vitamins: Secondary | ICD-10-CM | POA: Diagnosis not present

## 2019-12-24 DIAGNOSIS — Z23 Encounter for immunization: Secondary | ICD-10-CM | POA: Diagnosis not present

## 2020-01-31 ENCOUNTER — Other Ambulatory Visit: Payer: Self-pay | Admitting: Family Medicine

## 2020-02-21 DIAGNOSIS — D0439 Carcinoma in situ of skin of other parts of face: Secondary | ICD-10-CM | POA: Diagnosis not present

## 2020-02-24 ENCOUNTER — Encounter: Payer: Self-pay | Admitting: Internal Medicine

## 2020-02-24 ENCOUNTER — Other Ambulatory Visit: Payer: Self-pay

## 2020-02-24 ENCOUNTER — Ambulatory Visit: Payer: Medicare Other | Admitting: Internal Medicine

## 2020-02-24 DIAGNOSIS — G3184 Mild cognitive impairment, so stated: Secondary | ICD-10-CM

## 2020-02-24 DIAGNOSIS — I10 Essential (primary) hypertension: Secondary | ICD-10-CM | POA: Diagnosis not present

## 2020-02-24 DIAGNOSIS — I25119 Atherosclerotic heart disease of native coronary artery with unspecified angina pectoris: Secondary | ICD-10-CM

## 2020-02-24 DIAGNOSIS — K219 Gastro-esophageal reflux disease without esophagitis: Secondary | ICD-10-CM | POA: Diagnosis not present

## 2020-02-24 NOTE — Assessment & Plan Note (Signed)
Doing okay on PPI Uses prn mylanta rarely (none in 4-5 months) No dysphagia

## 2020-02-24 NOTE — Progress Notes (Signed)
Subjective:    Patient ID: Jason Velazquez, male    DOB: 1928-10-31, 87 y.o.   MRN: [id number]  HPI Visit in his AL apartment for review of chronic health conditions Reviewed status with Luellen Pucker RN  He would rather be in his house--but family prefers him having support Does appreciate that meals are provided, etc Has gained weight Doing okay socially Does walk a mile inside every morning  He remains independent with all ADLs Supervision for medication set up  Notes urinary spray---"all over the place" Flow is okay Nocturia x 1 Discussed sitting for voids  Has had some fleeting chest pains a couple of times Hasn't used nitro Stamina stable on walks has been good Breathing is okay No dizziness or syncope Notes slight edema--mostly in the AM  Mild memory issues have not progressed  Current Outpatient Medications on File Prior to Visit  Medication Sig Dispense Refill  . amLODipine (NORVASC) 2.5 MG tablet TAKE 1 TABLET BY MOUTH EVERY DAY 90 tablet 1  . aspirin EC 81 MG EC tablet Take 1 tablet (81 mg total) by mouth daily. 30 tablet 0  . atorvastatin (LIPITOR) 80 MG tablet TAKE 1 TABLET BY MOUTH EVERYDAY AT BEDTIME 90 tablet 2  . clopidogrel (PLAVIX) 75 MG tablet TAKE 1 TABLET BY MOUTH EVERY DAY 90 tablet 3  . cyanocobalamin (,VITAMIN B-12,) 1000 MCG/ML injection INJECT 1 ML (1,000 MCG TOTAL) INTO THE MUSCLE EVERY 30 (THIRTY) DAYS. 3 mL 4  . hydrocortisone 2.5 % lotion Apply 1 application topically 2 (two) times daily as needed.    . isosorbide mononitrate (IMDUR) 30 MG 24 hr tablet TAKE 1 TABLET BY MOUTH EVERY DAY 90 tablet 3  . ketoconazole (NIZORAL) 2 % cream Apply 1 application topically 2 (two) times daily. 60 g 1  . loteprednol (LOTEMAX) 0.2 % SUSP 1 drop as needed.    . metoprolol succinate (TOPROL-XL) 25 MG 24 hr tablet TAKE 1 TABLET (25 MG TOTAL) BY MOUTH DAILY. TAKE WITH OR IMMEDIATELY FOLLOWING A MEAL. 90 tablet 3  . nitroGLYCERIN (NITROSTAT) 0.4 MG SL tablet PLACE 1  TABLET (0.4 MG TOTAL) UNDER THE TONGUE EVERY 5 (FIVE) MINUTES AS NEEDED FOR CHEST PAIN. 25 tablet 5  . pantoprazole (PROTONIX) 40 MG tablet TAKE 1 TABLET BY MOUTH EVERY DAY 90 tablet 3  . polyethylene glycol (MIRALAX / GLYCOLAX) packet Take 17 g by mouth daily.     No current facility-administered medications on file prior to visit.    Allergies  Allergen Reactions  . Adhesive [Tape] Rash    ONLY USE PAPER TAPE  . Iodine Rash    Rash when applied to skin  . Niacin And Related Hives and Other (See Comments)    FLUSHING    Past Medical History:  Diagnosis Date  . Allergic rhinitis   . Benign prostatic hypertrophy   . Coronary artery disease CARDIOLOGIST -- DR Daneen Schick  . Duodenal diverticulum 2003  . GERD (gastroesophageal reflux disease)   . H/O hiatal hernia 2003  . Heart murmur   . Hepatic lesion 2004  . History of melanoma excision    FACE  . Hypercholesteremia   . Hypertension   . Left carotid stenosis    > 50%  PER DUPLEX  03-28-2011  . Mild mitral regurgitation   . Perianal fistula   . S/P CABG x 4    1988  . S/P drug eluting coronary stent placement    POST CABG--  STENTING 2000  AND RESTENTING  2006 FOR REINSTENT STENOSIS  . Stroke [facility])     Past Surgical History:  Procedure Laterality Date  . CATARACT EXTRACTION W/ INTRAOCULAR LENS  IMPLANT, BILATERAL    . CORONARY ANGIOPLASTY WITH STENT PLACEMENT  2000   PCI AND STENTING CIRCUMFLEX  . CORONARY ANGIOPLASTY WITH STENT PLACEMENT  04-30-2004  DR Daneen Schick   RE-INSTENT STENOSIS/  DRUG-ELUTING STENT OF PROXIMAL AND MID CIRCUMFLEX/ PCI MID CIRCUMFLEX THROUGH STENT STRUT/  WIDELY PATENT SAPHENOUS VEIN GRAFT AND  LIMA TO LAD GRAFT/ TOTAL OCCLUSION RIGHT CORONARY BEYOND THE POSTERIOR DESCENDING ARTERY BRANCH WITH 50% OSTIAL NARROWING/ TOTAL OCCLUSION OF LAD AT THE OSTIUM OF THE LEFT MAIN  . CORONARY ARTERY BYPASS GRAFT  1988   X5  . EVALUATION UNDER ANESTHESIA WITH ANAL FISTULECTOMY N/A 07/22/2012   Procedure:  EXAM UNDER ANESTHESIA WITH ANAL fistulotomy;  Surgeon: Leighton Ruff, MD;  Location: [facility];  Service: General;  Laterality: N/A;  . LEFT HEART CATH AND CORS/GRAFTS ANGIOGRAPHY N/A 03/31/2018   Procedure: LEFT HEART CATH AND CORS/GRAFTS ANGIOGRAPHY;  Surgeon: Nelva Bush, MD;  Location: [place] [facility];  Service: Cardiovascular;  Laterality: N/A;  . SHOULDER ARTHROSCOPY WITH OPEN ROTATOR CUFF REPAIR Right 07-10-1999  . TONSILLECTOMY AND ADENOIDECTOMY  1944  . TRANSTHORACIC ECHOCARDIOGRAM  03-01-2011  DR Daneen Schick   NORMAL LVF AND LV SIZE/ MILD LEFT ATRIAL ENLARGEMENT/ GRADE II DIASTOLIC DYSFUNCTION WITH ELEVATED LEFT ATRIAL PRESSURE/ MILD TO MODERATE MR/ MILD TR  . TRANSURETHRAL RESECTION OF PROSTATE  04/09/2011   Procedure: TRANSURETHRAL RESECTION OF THE PROSTATE WITH GYRUS INSTRUMENTS;  Surgeon: Malka So, MD;  Location: [facility];  Service: Urology;  Laterality: N/A;    Family History  Problem Relation Age of Onset  . Breast cancer Mother   . Stroke Father   . Colon cancer Brother     Social History   Socioeconomic History  . Marital status: Widowed    Spouse name: [name]  . Number of children: 3  . Years of education: B.D.  . Highest education level: Not on file  Occupational History  . Occupation: Land    Comment: Retired  Tobacco Use  . Smoking status: Never Smoker  . Smokeless tobacco: Never Used  Vaping Use  . Vaping Use: Never used  Substance and Sexual Activity  . Alcohol use: No  . Drug use: No  . Sexual activity: Not Currently  Other Topics Concern  . Not on file  Social History Narrative   Lives alone, at [facility].  Has some help with housecleaning but o/w he is independent.     Was married to first wife 54 years, then to second wife 12 years.  Widowed twice.        Retired Theme park manager, [employer]   One son was killed in [place] in 1984   One son and one daughter (she is an Therapist, sports) still alive in        Has living will   Daughter is health care POA   Has DNR    No feeding tube if cognitively unaware   Social Determinants of Radio broadcast assistant Strain: Not on file  Food Insecurity: Not on file  Transportation Needs: Not on file  Physical Activity: Not on file  Stress: Not on file  Social Connections: Not on file  Intimate Partner Violence: Not on file   Review of Systems Appetite is good Sleeps well Takes miralax prn if constipated No sig back or  joint pains    Objective:   Physical Exam Constitutional:      Appearance: Normal appearance.  Cardiovascular:     Rate and Rhythm: Normal rate and regular rhythm.     Pulses: Normal pulses.     Heart sounds: No murmur heard. No gallop.   Pulmonary:     Effort: Pulmonary effort is normal.     Breath sounds: Normal breath sounds. No wheezing or rales.  Abdominal:     Palpations: Abdomen is soft.     Tenderness: There is no abdominal tenderness.  Musculoskeletal:     Cervical back: Neck supple.     Comments: Calves are full but no true edema  Lymphadenopathy:     Cervical: No cervical adenopathy.  Neurological:     Mental Status: He is alert.  Psychiatric:        Mood and Affect: Mood normal.        Behavior: Behavior normal.            Assessment & Plan:

## 2020-02-24 NOTE — Assessment & Plan Note (Signed)
BP Readings from Last 3 Encounters:  02/24/20 138/78  11/03/19 (!) 182/84  07/15/19 (!) 147/73   Good control on metoprolol, amlodipine

## 2020-02-24 NOTE — Assessment & Plan Note (Signed)
Mild and no evidence of progression

## 2020-02-24 NOTE — Assessment & Plan Note (Addendum)
Mild brief angina Maintaining good activity level and functional independence On ASA, plavix and metoprolol On imdur

## 2020-03-06 DIAGNOSIS — X32XXXA Exposure to sunlight, initial encounter: Secondary | ICD-10-CM | POA: Diagnosis not present

## 2020-03-06 DIAGNOSIS — L570 Actinic keratosis: Secondary | ICD-10-CM | POA: Diagnosis not present

## 2020-03-06 DIAGNOSIS — L820 Inflamed seborrheic keratosis: Secondary | ICD-10-CM | POA: Diagnosis not present

## 2020-04-24 ENCOUNTER — Other Ambulatory Visit: Payer: Self-pay | Admitting: Family Medicine

## 2020-06-07 ENCOUNTER — Ambulatory Visit: Payer: Medicare Other | Admitting: Internal Medicine

## 2020-06-07 ENCOUNTER — Other Ambulatory Visit: Payer: Self-pay

## 2020-06-07 DIAGNOSIS — I25119 Atherosclerotic heart disease of native coronary artery with unspecified angina pectoris: Secondary | ICD-10-CM | POA: Diagnosis not present

## 2020-06-07 DIAGNOSIS — E785 Hyperlipidemia, unspecified: Secondary | ICD-10-CM

## 2020-06-07 DIAGNOSIS — I10 Essential (primary) hypertension: Secondary | ICD-10-CM

## 2020-06-07 DIAGNOSIS — N400 Enlarged prostate without lower urinary tract symptoms: Secondary | ICD-10-CM

## 2020-06-07 DIAGNOSIS — K219 Gastro-esophageal reflux disease without esophagitis: Secondary | ICD-10-CM

## 2020-06-07 DIAGNOSIS — G3184 Mild cognitive impairment, so stated: Secondary | ICD-10-CM

## 2020-06-08 ENCOUNTER — Encounter: Payer: Self-pay | Admitting: Internal Medicine

## 2020-06-08 NOTE — Assessment & Plan Note (Signed)
Intermittent angina Continue Atorvastatin, Amlodipine, Isosorbide, Metoprolol, Aspirin and Plavix Will monitor symptoms

## 2020-06-08 NOTE — Assessment & Plan Note (Signed)
Not medicated Appreciate ALF care

## 2020-06-08 NOTE — Assessment & Plan Note (Signed)
Uncontrolled Will increase Amlodipine to 5 mg daily BP goal less than 150/90 We will monitor

## 2020-06-08 NOTE — Progress Notes (Signed)
Subjective:    Patient ID: Jason Velazquez, male    DOB: July 20, 1928, 87 y.o.   MRN: [id number]  HPI  Resident seen in [address]. No new concerns from RN. Resident denies concerns at this time, reports he has been doing well. He sleeps well. He walks without a device. He is independent with ADL's. He reports his appetite is too good, weight is stable. He denies urinary issues. His bowels move good with Mirilax. He reports some intermittent chest pain that does not last very long and does not occur to often. He denies shortness of breath. He reports intermittent thigh pain that is worse first thing in the morning and at night. He denies any issues with mood.  HTN: His BP is variable but seems to be more consistently elevated. BP today 183/94. He is taking Amlodipine and Metoprolol as prescribed.   HLD: He thinks his thigh pain may be coming for the Atorvastatin but he does not think it is severe enough to stop the medication. He consumes a low fat diet.  CAD: Intermittent chest pain, managed on Atorvastatin, Amlodipine, Isosorbide, Metoprolol, ASA and Plavix.  GERD: He denies breakthrough on Pantoprazole.  MCI: Mild cognitive issues. Not medicated.  BPH: He denies urinary issues at this time. Not medicated.  Review of Systems      Past Medical History:  Diagnosis Date  . Allergic rhinitis   . Benign prostatic hypertrophy   . Coronary artery disease CARDIOLOGIST -- DR Daneen Schick  . Duodenal diverticulum 2003  . GERD (gastroesophageal reflux disease)   . H/O hiatal hernia 2003  . Heart murmur   . Hepatic lesion 2004  . History of melanoma excision    FACE  . Hypercholesteremia   . Hypertension   . Left carotid stenosis    > 50%  PER DUPLEX  03-28-2011  . Mild mitral regurgitation   . Perianal fistula   . S/P CABG x 4    1988  . S/P drug eluting coronary stent placement    POST CABG--  STENTING 2000  AND RESTENTING 2006 FOR REINSTENT STENOSIS  . Stroke [facility])     Current  Outpatient Medications  Medication Sig Dispense Refill  . amLODipine (NORVASC) 2.5 MG tablet TAKE 1 TABLET BY MOUTH EVERY DAY 90 tablet 1  . aspirin EC 81 MG EC tablet Take 1 tablet (81 mg total) by mouth daily. 30 tablet 0  . atorvastatin (LIPITOR) 80 MG tablet TAKE 1 TABLET BY MOUTH EVERYDAY AT BEDTIME 90 tablet 2  . clopidogrel (PLAVIX) 75 MG tablet TAKE 1 TABLET BY MOUTH EVERY DAY 90 tablet 3  . cyanocobalamin (,VITAMIN B-12,) 1000 MCG/ML injection INJECT 1 ML (1,000 MCG TOTAL) INTO THE MUSCLE EVERY 30 (THIRTY) DAYS. 3 mL 4  . hydrocortisone 2.5 % lotion Apply 1 application topically 2 (two) times daily as needed.    . isosorbide mononitrate (IMDUR) 30 MG 24 hr tablet TAKE 1 TABLET BY MOUTH EVERY DAY 90 tablet 3  . ketoconazole (NIZORAL) 2 % cream Apply 1 application topically 2 (two) times daily. 60 g 1  . loteprednol (LOTEMAX) 0.2 % SUSP 1 drop as needed.    . metoprolol succinate (TOPROL-XL) 25 MG 24 hr tablet TAKE 1 TABLET (25 MG TOTAL) BY MOUTH DAILY. TAKE WITH OR IMMEDIATELY FOLLOWING A MEAL. 90 tablet 3  . nitroGLYCERIN (NITROSTAT) 0.4 MG SL tablet PLACE 1 TABLET (0.4 MG TOTAL) UNDER THE TONGUE EVERY 5 (FIVE) MINUTES AS NEEDED FOR CHEST PAIN. 25 tablet  5  . pantoprazole (PROTONIX) 40 MG tablet TAKE 1 TABLET BY MOUTH EVERY DAY 90 tablet 3  . polyethylene glycol (MIRALAX / GLYCOLAX) packet Take 17 g by mouth daily.     No current facility-administered medications for this visit.    Allergies  Allergen Reactions  . Adhesive [Tape] Rash    ONLY USE PAPER TAPE  . Iodine Rash    Rash when applied to skin  . Niacin And Related Hives and Other (See Comments)    FLUSHING    Family History  Problem Relation Age of Onset  . Breast cancer Mother   . Stroke Father   . Colon cancer Brother     Social History   Socioeconomic History  . Marital status: Widowed    Spouse name: [name]  . Number of children: 3  . Years of education: B.D.  . Highest education level: Not on file   Occupational History  . Occupation: Land    Comment: Retired  Tobacco Use  . Smoking status: Never Smoker  . Smokeless tobacco: Never Used  Vaping Use  . Vaping Use: Never used  Substance and Sexual Activity  . Alcohol use: No  . Drug use: No  . Sexual activity: Not Currently  Other Topics Concern  . Not on file  Social History Narrative   Lives alone, at [facility].  Has some help with housecleaning but o/w he is independent.     Was married to first wife 107 years, then to second wife 12 years.  Widowed twice.        Retired Theme park manager, [employer]   One son was killed in [place] in 1984   One son and one daughter (she is an Therapist, sports) still alive in       Has living will   Daughter is health care POA   Has DNR    No feeding tube if cognitively unaware   Social Determinants of Radio broadcast assistant Strain: Not on file  Food Insecurity: Not on file  Transportation Needs: Not on file  Physical Activity: Not on file  Stress: Not on file  Social Connections: Not on file  Intimate Partner Violence: Not on file     Constitutional: Denies fever, malaise, fatigue, headache or abrupt weight changes.  HEENT: Denies eye pain, eye redness, ear pain, ringing in the ears, wax buildup, runny nose, nasal congestion, bloody nose, or sore throat. Respiratory: Denies difficulty breathing, shortness of breath, cough or sputum production.   Cardiovascular: Pt reports intermittent chest pain. Denies chest tightness, palpitations or swelling in the hands or feet.  Gastrointestinal: Denies abdominal pain, bloating, constipation, diarrhea or blood in the stool.  GU: Denies urgency, frequency, pain with urination, burning sensation, blood in urine, odor or discharge. Musculoskeletal: Denies decrease in range of motion, difficulty with gait, muscle pain or joint pain and swelling.  Skin: Denies redness, rashes, lesions or ulcercations.  Neurological: Denies dizziness,  difficulty with memory, difficulty with speech or problems with balance and coordination.  Psych: Denies anxiety, depression, SI/HI.  No other specific complaints in a complete review of systems (except as listed in HPI above).  Objective:   Physical Exam   BP (!) 183/94   Pulse 74   Temp 97.8 F (36.6 C)   Resp 20   Wt 197 lb 6.4 oz (89.5 kg)   BMI 33.88 kg/m  Wt Readings from Last 3 Encounters:  06/08/20 197 lb 6.4 oz (89.5 kg)  02/24/20 198 lb 9.6 oz (90.1 kg)  11/03/19 194 lb (88 kg)    General: Appears his stated age, obese, in NAD. HEENT: Head: normal shape and size; Eyes: sclera white and EOMs intact;  Neck:  Neck supple, trachea midline. No masses, lumps or thyromegaly present.  Cardiovascular: Normal rate and rhythm. S1,S2 noted.  No murmur, rubs or gallops noted. No JVD or BLE edema.  Pulmonary/Chest: Normal effort and positive vesicular breath sounds. No respiratory distress. No wheezes, rales or ronchi noted.  Abdomen: Soft and nontender. Normal bowel sounds.  Musculoskeletal:  No difficulty with gait.  Neurological: Alert and oriented.  Psychiatric: Mood and affect normal. Behavior is normal. Judgment and thought content normal.     BMET    Component Value Date/Time   NA 135 04/01/2018 0520   K 3.9 01/01/2019 0000   CL 106 04/01/2018 0520   CO2 25 04/01/2018 0520   GLUCOSE 104 (H) 04/01/2018 0520   BUN 11 04/01/2018 0520   CREATININE 1.1 12/31/2018 0000   CREATININE 0.92 04/01/2018 0520   CALCIUM 8.4 (L) 04/01/2018 0520   GFRNONAA >60 04/01/2018 0520   GFRAA >60 04/01/2018 0520    Lipid Panel     Component Value Date/Time   CHOL 137 03/31/2018 0552   CHOL 138 12/16/2012 0837   TRIG 64 03/31/2018 0552   HDL 42 03/31/2018 0552   HDL 46 12/16/2012 0837   CHOLHDL 3.3 03/31/2018 0552   VLDL 13 03/31/2018 0552   LDLCALC 82 03/31/2018 0552   LDLCALC 75 12/16/2012 0837    CBC    Component Value Date/Time   WBC 8.1 04/02/2018 0341   RBC 4.12  (L) 04/02/2018 0341   HGB 14.1 12/31/2018 0000   HCT 38.1 (L) 04/02/2018 0341   PLT 174 04/02/2018 0341   MCV 92.5 04/02/2018 0341   MCH 30.6 04/02/2018 0341   MCHC 33.1 04/02/2018 0341   RDW 12.6 04/02/2018 0341   LYMPHSABS 1.6 03/31/2018 0223   MONOABS 0.7 03/31/2018 0223   EOSABS 0.2 03/31/2018 0223   BASOSABS 0.1 03/31/2018 0223    Hgb A1C Lab Results  Component Value Date   HGBA1C 5.8 (H) 05/27/2013           Assessment & Plan:

## 2020-06-08 NOTE — Assessment & Plan Note (Signed)
Encouraged him to consume alow fat diet Continue Atorvastatin 

## 2020-06-08 NOTE — Assessment & Plan Note (Signed)
Continue Pantoprazole for now 

## 2020-06-08 NOTE — Assessment & Plan Note (Signed)
No issues off meds °

## 2020-06-15 ENCOUNTER — Other Ambulatory Visit: Payer: Self-pay | Admitting: Family Medicine

## 2020-06-21 ENCOUNTER — Ambulatory Visit (INDEPENDENT_AMBULATORY_CARE_PROVIDER_SITE_OTHER): Payer: Medicare Other | Admitting: Internal Medicine

## 2020-06-21 ENCOUNTER — Encounter: Payer: Self-pay | Admitting: Internal Medicine

## 2020-06-21 ENCOUNTER — Other Ambulatory Visit: Payer: Self-pay

## 2020-06-21 ENCOUNTER — Non-Acute Institutional Stay: Payer: Medicare Other | Admitting: Internal Medicine

## 2020-06-21 VITALS — BP 126/78 | HR 64 | Ht 64.0 in | Wt 195.0 lb

## 2020-06-21 VITALS — BP 127/75 | HR 74 | Temp 97.8°F | Resp 20 | Wt 206.0 lb

## 2020-06-21 DIAGNOSIS — I10 Essential (primary) hypertension: Secondary | ICD-10-CM

## 2020-06-21 DIAGNOSIS — I255 Ischemic cardiomyopathy: Secondary | ICD-10-CM | POA: Diagnosis not present

## 2020-06-21 DIAGNOSIS — E785 Hyperlipidemia, unspecified: Secondary | ICD-10-CM | POA: Diagnosis not present

## 2020-06-21 DIAGNOSIS — I25118 Atherosclerotic heart disease of native coronary artery with other forms of angina pectoris: Secondary | ICD-10-CM | POA: Diagnosis not present

## 2020-06-21 DIAGNOSIS — I25119 Atherosclerotic heart disease of native coronary artery with unspecified angina pectoris: Secondary | ICD-10-CM | POA: Diagnosis not present

## 2020-06-21 DIAGNOSIS — K219 Gastro-esophageal reflux disease without esophagitis: Secondary | ICD-10-CM | POA: Diagnosis not present

## 2020-06-21 DIAGNOSIS — R0789 Other chest pain: Secondary | ICD-10-CM | POA: Diagnosis not present

## 2020-06-21 NOTE — Patient Instructions (Addendum)
Medication Instructions:  - Your physician has recommended you make the following change in your medication:   1) INCREASE protonix 40 mg- take 1 tablet by mouth twice daily x 2 weeks  - if your symptoms of heart burn do not improve, then please call the office at [phone]  *If you need a refill on your cardiac medications before your next appointment, please call your pharmacy*   Lab Work: - none ordered today  If you have labs (blood work) drawn today and your tests are completely normal, you will receive your results only by: Marland Kitchen MyChart Message (if you have MyChart) OR . A paper copy in the mail If you have any lab test that is abnormal or we need to change your treatment, we will call you to review the results.   Testing/Procedures: - none ordered today   Follow-Up: At [facility], you and your health needs are our priority.  As part of our continuing mission to provide you with exceptional heart care, we have created designated Provider Care Teams.  These Care Teams include your primary Cardiologist (physician) and Advanced Practice Providers (APPs -  Physician Assistants and Nurse Practitioners) who all work together to provide you with the care you need, when you need it.  We recommend signing up for the patient portal called "MyChart".  Sign up information is provided on this After Visit Summary.  MyChart is used to connect with patients for Virtual Visits (Telemedicine).  Patients are able to view lab/test results, encounter notes, upcoming appointments, etc.  Non-urgent messages can be sent to your provider as well.   To learn more about what you can do with MyChart, go to [email or web address].    Your next appointment:   1 month(s)  The format for your next appointment:   In Person  Provider:   You may see Nelva Bush, MD or one of the following Advanced Practice Providers on your designated Care Team:    Murray Hodgkins, NP  Christell Faith,  PA-C  Marrianne Mood, PA-C  Cadence Blanchard, [place]  Laurann Montana, NP    Other Instructions n/a

## 2020-06-21 NOTE — Progress Notes (Signed)
Follow-up Outpatient Visit Date: 06/21/2020  Primary Care Provider: Venia Carbon, MD [place] [address]  Chief Complaint: Heartburn  HPI:  Jason Velazquez is a 87 y.o. male with history of CAD s/p CABG (1998; LIMA-LAD, SVG-D1-D2, SVG-rPL) and subsequent PCI to proximal LCx, stroke with residual memory deficites, carotid artery stenosis, hypertension, hyperlipidemia, and BPH, who presents for follow-up of coronary artery disease and hypertension.  He was last evaluated in our office in 05/2018 via virtual visit, at which time he was doing well.  He had been hospitalized at [facility] in 03/2018 with chest pain and elevated troponin consistent with NSTEMI.  Catheterization showed acute/subsequent occlusion of sequential SVG to diagonal branches, which was managed medically.  Today, Jason Velazquez presents for evaluation of worsening heartburn.  He describes a burning sensation in the center of his chest which has been worse over the last few weeks.  He had a severe episode last week that occurred while he was waiting to have his blood pressure checked at the [facility].  He was given an antacid with improvement.  He reports walking about a mile a day and does not have any exertional chest pain or shortness of breath.  He has mild chronic swelling of the left calf, which is stable.  He has not had any orthopnea, PND, palpitations, or lightheadedness.  --------------------------------------------------------------------------------------------------  Past Medical History:  Diagnosis Date  . Allergic rhinitis   . Benign prostatic hypertrophy   . Coronary artery disease CARDIOLOGIST -- DR Daneen Schick  . Duodenal diverticulum 2003  . GERD (gastroesophageal reflux disease)   . H/O hiatal hernia 2003  . Heart murmur   . Hepatic lesion 2004  . History of melanoma excision    FACE  . Hypercholesteremia   . Hypertension   . Left carotid stenosis    > 50%  PER DUPLEX   03-28-2011  . Mild mitral regurgitation   . Perianal fistula   . S/P CABG x 4    1988  . S/P drug eluting coronary stent placement    POST CABG--  STENTING 2000  AND RESTENTING 2006 FOR REINSTENT STENOSIS  . Stroke [facility])    Past Surgical History:  Procedure Laterality Date  . CATARACT EXTRACTION W/ INTRAOCULAR LENS  IMPLANT, BILATERAL    . CORONARY ANGIOPLASTY WITH STENT PLACEMENT  2000   PCI AND STENTING CIRCUMFLEX  . CORONARY ANGIOPLASTY WITH STENT PLACEMENT  04-30-2004  DR Daneen Schick   RE-INSTENT STENOSIS/  DRUG-ELUTING STENT OF PROXIMAL AND MID CIRCUMFLEX/ PCI MID CIRCUMFLEX THROUGH STENT STRUT/  WIDELY PATENT SAPHENOUS VEIN GRAFT AND  LIMA TO LAD GRAFT/ TOTAL OCCLUSION RIGHT CORONARY BEYOND THE POSTERIOR DESCENDING ARTERY BRANCH WITH 50% OSTIAL NARROWING/ TOTAL OCCLUSION OF LAD AT THE OSTIUM OF THE LEFT MAIN  . CORONARY ARTERY BYPASS GRAFT  1988   X5  . EVALUATION UNDER ANESTHESIA WITH ANAL FISTULECTOMY N/A 07/22/2012   Procedure: EXAM UNDER ANESTHESIA WITH ANAL fistulotomy;  Surgeon: Leighton Ruff, MD;  Location: [facility];  Service: General;  Laterality: N/A;  . LEFT HEART CATH AND CORS/GRAFTS ANGIOGRAPHY N/A 03/31/2018   Procedure: LEFT HEART CATH AND CORS/GRAFTS ANGIOGRAPHY;  Surgeon: Nelva Bush, MD;  Location: [place] CV LAB;  Service: Cardiovascular;  Laterality: N/A;  . SHOULDER ARTHROSCOPY WITH OPEN ROTATOR CUFF REPAIR Right 07-10-1999  . TONSILLECTOMY AND ADENOIDECTOMY  1944  . TRANSTHORACIC ECHOCARDIOGRAM  03-01-2011  DR Daneen Schick   NORMAL LVF AND LV SIZE/  MILD LEFT ATRIAL ENLARGEMENT/ GRADE II DIASTOLIC DYSFUNCTION WITH ELEVATED LEFT ATRIAL PRESSURE/ MILD TO MODERATE MR/ MILD TR  . TRANSURETHRAL RESECTION OF PROSTATE  04/09/2011   Procedure: TRANSURETHRAL RESECTION OF THE PROSTATE WITH GYRUS INSTRUMENTS;  Surgeon: Malka So, MD;  Location: [facility];  Service: Urology;  Laterality: N/A;    Current Meds  Medication Sig  . aluminum-magnesium  hydroxide 200-200 MG/5ML suspension Take by mouth as needed for indigestion.  Marland Kitchen amLODipine (NORVASC) 5 MG tablet Take 1 tablet (5 mg total) by mouth daily.  Marland Kitchen aspirin EC 81 MG EC tablet Take 1 tablet (81 mg total) by mouth daily.  Marland Kitchen atorvastatin (LIPITOR) 80 MG tablet TAKE 1 TABLET BY MOUTH EVERYDAY AT BEDTIME  . clopidogrel (PLAVIX) 75 MG tablet TAKE 1 TABLET BY MOUTH EVERY DAY  . cyanocobalamin (,VITAMIN B-12,) 1000 MCG/ML injection Inject 1,000 mcg into the muscle every 30 (thirty) days.  . isosorbide mononitrate (IMDUR) 30 MG 24 hr tablet TAKE 1 TABLET BY MOUTH EVERY DAY  . ketoconazole (NIZORAL) 2 % cream Apply 1 application topically 2 (two) times daily.  . metoprolol succinate (TOPROL-XL) 25 MG 24 hr tablet TAKE 1 TABLET (25 MG TOTAL) BY MOUTH DAILY. TAKE WITH OR IMMEDIATELY FOLLOWING A MEAL.  . nitroGLYCERIN (NITROSTAT) 0.4 MG SL tablet PLACE 1 TABLET (0.4 MG TOTAL) UNDER THE TONGUE EVERY 5 (FIVE) MINUTES AS NEEDED FOR CHEST PAIN.  Marland Kitchen pantoprazole (PROTONIX) 40 MG tablet TAKE 1 TABLET BY MOUTH EVERY DAY  . polyethylene glycol (MIRALAX / GLYCOLAX) packet Take 17 g by mouth daily.    Allergies: Adhesive [tape], Iodine, and Niacin and related  Social History   Tobacco Use  . Smoking status: Never Smoker  . Smokeless tobacco: Never Used  Vaping Use  . Vaping Use: Never used  Substance Use Topics  . Alcohol use: No  . Drug use: No    Family History  Problem Relation Age of Onset  . Breast cancer Mother   . Stroke Father   . Colon cancer Brother   . Stroke Brother     Review of Systems: A 12-system review of systems was performed and was negative except as noted in the HPI.  --------------------------------------------------------------------------------------------------  Physical Exam: BP 126/78 (BP Location: Left Arm, Patient Position: Sitting, Cuff Size: Large)   Pulse 64   Ht 5\' 4"  (1.626 m)   Wt 195 lb (88.5 kg)   SpO2 96%   BMI 33.47 kg/m   General:   NAD. Neck: No JVD or HJR. Lungs: Clear to auscultation bilaterally without wheezes or crackles. Heart: Regular rate and rhythm without murmurs, rubs, or gallops. Abdomen: Soft, nontender, nondistended. Extremities: Trace bilateral pretibial edema.  EKG: Normal sinus rhythm without abnormality.  Lab Results  Component Value Date   WBC 8.1 04/02/2018   HGB 14.1 12/31/2018   HCT 38.1 (L) 04/02/2018   MCV 92.5 04/02/2018   PLT 174 04/02/2018    Lab Results  Component Value Date   NA 135 04/01/2018   K 3.9 01/01/2019   CL 106 04/01/2018   CO2 25 04/01/2018   BUN 11 04/01/2018   CREATININE 1.1 12/31/2018   GLUCOSE 104 (H) 04/01/2018   ALT 35 12/31/2018    Lab Results  Component Value Date   CHOL 137 03/31/2018   HDL 42 03/31/2018   LDLCALC 82 03/31/2018   TRIG 64 03/31/2018   CHOLHDL 3.3 03/31/2018    --------------------------------------------------------------------------------------------------  ASSESSMENT AND PLAN: Coronary artery disease with stable angina and atypical  chest pain Jason Velazquez has a history of severe native CAD as well as bypass graft disease based on most recent catheterization in 2020.  He reports burning chest pain that seems to happen randomly but at times is associated with greasy foods over the last few weeks.  This is not typical for his angina, though in light of his severe CAD, could be an anginal equivalent.  We discussed escalation of his PPI therapy, titration of antianginal medications, and repeat catheterization.  Jason Velazquez would like a trial of pantoprazole 40 mg twice daily for 2 weeks to see if this helps improve his symptoms.  If not, we may need to consider increasing isosorbide mononitrate as well.  I would like to reserve cardiac catheterization for refractory symptoms, given that he has known severe native and graft disease that may be somewhat challenging to intervene upon percutaneously.  We will Plan to continue current regimen of  clopidogrel, amlodipine, metoprolol, and isosorbide mononitrate.  Ischemic cardiomyopathy: Jason Velazquez appears euvolemic with stable NYHA class II symptoms.  We will continue metoprolol succinate 25 mg daily.  Hypertension: Blood pressure well controlled today.  No medication changes at this time.  Hyperlipidemia: Continue atorvastatin 80 mg daily.  We will request most recent lipid panel from [facility] to ensure adequate response.  Follow-up: Return to clinic in 1 month.  Nelva Bush, MD 06/21/2020 1:20 PM

## 2020-06-22 ENCOUNTER — Encounter: Payer: Self-pay | Admitting: Internal Medicine

## 2020-06-22 DIAGNOSIS — E538 Deficiency of other specified B group vitamins: Secondary | ICD-10-CM | POA: Diagnosis not present

## 2020-06-22 DIAGNOSIS — I214 Non-ST elevation (NSTEMI) myocardial infarction: Secondary | ICD-10-CM | POA: Diagnosis not present

## 2020-06-22 DIAGNOSIS — I2510 Atherosclerotic heart disease of native coronary artery without angina pectoris: Secondary | ICD-10-CM | POA: Diagnosis not present

## 2020-06-22 DIAGNOSIS — I10 Essential (primary) hypertension: Secondary | ICD-10-CM | POA: Diagnosis not present

## 2020-06-22 DIAGNOSIS — E785 Hyperlipidemia, unspecified: Secondary | ICD-10-CM | POA: Diagnosis not present

## 2020-06-23 ENCOUNTER — Encounter: Payer: Self-pay | Admitting: Internal Medicine

## 2020-06-23 DIAGNOSIS — I255 Ischemic cardiomyopathy: Secondary | ICD-10-CM | POA: Insufficient documentation

## 2020-06-23 NOTE — Patient Instructions (Signed)
Food Choices for Gastroesophageal Reflux Disease, Adult When you have gastroesophageal reflux disease (GERD), the foods you eat and your eating habits are very important. Choosing the right foods can help ease your discomfort. Think about working with a food expert (dietitian) to help you make good choices. What are tips for following this plan? Reading food labels  Look for foods that are low in saturated fat. Foods that may help with your symptoms include: ? Foods that have less than 5% of daily value (DV) of fat. ? Foods that have 0 grams of trans fat. Cooking  Do not fry your food.  Cook your food by baking, steaming, grilling, or broiling. These are all methods that do not need a lot of fat for cooking.  To add flavor, try to use herbs that are low in spice and acidity. Meal planning  Choose healthy foods that are low in fat, such as: ? Fruits and vegetables. ? Whole grains. ? Low-fat dairy products. ? Lean meats, fish, and poultry.  Eat small meals often instead of eating 3 large meals each day. Eat your meals slowly in a place where you are relaxed. Avoid bending over or lying down until 2-3 hours after eating.  Limit high-fat foods such as fatty meats or fried foods.  Limit your intake of fatty foods, such as oils, butter, and shortening.  Avoid the following as told by your doctor: ? Foods that cause symptoms. These may be different for different people. Keep a food diary to keep track of foods that cause symptoms. ? Alcohol. ? Drinking a lot of liquid with meals. ? Eating meals during the 2-3 hours before bed.   Lifestyle  Stay at a healthy weight. Ask your doctor what weight is healthy for you. If you need to lose weight, work with your doctor to do so safely.  Exercise for at least 30 minutes on 5 or more days each week, or as told by your doctor.  Wear loose-fitting clothes.  Do not smoke or use any products that contain nicotine or tobacco. If you need help  quitting, ask your doctor.  Sleep with the head of your bed higher than your feet. Use a wedge under the mattress or blocks under the bed frame to raise the head of the bed.  Chew sugar-free gum after meals. What foods should eat? Eat a healthy, well-balanced diet of fruits, vegetables, whole grains, low-fat dairy products, lean meats, fish, and poultry. Each person is different. Foods that may cause symptoms in one person may not cause any symptoms in another person. Work with your doctor to find foods that are safe for you. The items listed above may not be a complete list of what you can eat and drink. Contact a food expert for more options.   What foods should I avoid? Limiting some of these foods may help in managing the symptoms of GERD. Everyone is different. Talk with a food expert or your doctor to help you find the exact foods to avoid, if any. Fruits Any fruits prepared with added fat. Any fruits that cause symptoms. For some people, this may include citrus fruits, such as oranges, grapefruit, pineapple, and lemons. Vegetables Deep-fried vegetables. French fries. Any vegetables prepared with added fat. Any vegetables that cause symptoms. For some people, this may include tomatoes and tomato products, chili peppers, onions and garlic, and horseradish. Grains Pastries or quick breads with added fat. Meats and other proteins High-fat meats, such as fatty beef or pork,   hot dogs, ribs, ham, sausage, salami, and bacon. Fried meat or protein, including fried fish and fried chicken. Nuts and nut butters, in large amounts. Dairy Whole milk and chocolate milk. Sour cream. Cream. Ice cream. Cream cheese. Milkshakes. Fats and oils Butter. Margarine. Shortening. Ghee. Beverages Coffee and tea, with or without caffeine. Carbonated beverages. Sodas. Energy drinks. Fruit juice made with acidic fruits, such as orange or grapefruit. Tomato juice. Alcoholic drinks. Sweets and desserts Chocolate and  cocoa. Donuts. Seasonings and condiments Pepper. Peppermint and spearmint. Added salt. Any condiments, herbs, or seasonings that cause symptoms. For some people, this may include curry, hot sauce, or vinegar-based salad dressings. The items listed above may not be a complete list of what you should not eat and drink. Contact a food expert for more options. Questions to ask your doctor Diet and lifestyle changes are often the first steps that are taken to manage symptoms of GERD. If diet and lifestyle changes do not help, talk with your doctor about taking medicines. Where to find more information  International Foundation for Gastrointestinal Disorders: aboutgerd.org Summary  When you have GERD, food and lifestyle choices are very important in easing your symptoms.  Eat small meals often instead of 3 large meals a day. Eat your meals slowly and in a place where you are relaxed.  Avoid bending over or lying down until 2-3 hours after eating.  Limit high-fat foods such as fatty meats or fried foods. This information is not intended to replace advice given to you by your health care provider. Make sure you discuss any questions you have with your health care provider. Document Revised: 08/09/2019 Document Reviewed: 08/09/2019 Elsevier Patient Education  2021 Elsevier Inc.  

## 2020-06-23 NOTE — Progress Notes (Signed)
Subjective:    Patient ID: Jason Velazquez, male    DOB: Apr 03, 1928, 87 y.o.   MRN: [id number]  HPI  Resident seen in [address] RN reports episode of epigastric/chest pain 3 days ago RN reports resident had gotten confused with his medications, had not had Metoprolol for a few days prior. Hx of HTN, HLD, ischemic cardiomyopathy and CAD. Dr. Silvio Pate increase his Pantoprazole with complete resolution of symptoms. Resident saw cardiology 5/11 for the same. ECG was normal at that time. Dr. Saunders Revel did not feel like it was cardiac in nature. No medication changes were made.  Resident reports he feels well today. No reflux, heartburn, abdominal pain, chest pain, chest tightness, shortness of breath, nausea or vomiting.  Review of Systems      Past Medical History:  Diagnosis Date  . Allergic rhinitis   . Benign prostatic hypertrophy   . Coronary artery disease CARDIOLOGIST -- DR Daneen Schick  . Duodenal diverticulum 2003  . GERD (gastroesophageal reflux disease)   . H/O hiatal hernia 2003  . Heart murmur   . Hepatic lesion 2004  . History of melanoma excision    FACE  . Hypercholesteremia   . Hypertension   . Left carotid stenosis    > 50%  PER DUPLEX  03-28-2011  . Mild mitral regurgitation   . Perianal fistula   . S/P CABG x 4    1988  . S/P drug eluting coronary stent placement    POST CABG--  STENTING 2000  AND RESTENTING 2006 FOR REINSTENT STENOSIS  . Stroke [facility])     Current Outpatient Medications  Medication Sig Dispense Refill  . amLODipine (NORVASC) 5 MG tablet Take 1 tablet (5 mg total) by mouth daily. 90 tablet 3  . atorvastatin (LIPITOR) 80 MG tablet TAKE 1 TABLET BY MOUTH EVERYDAY AT BEDTIME 90 tablet 2  . clopidogrel (PLAVIX) 75 MG tablet TAKE 1 TABLET BY MOUTH EVERY DAY 90 tablet 3  . cyanocobalamin (,VITAMIN B-12,) 1000 MCG/ML injection Inject 1,000 mcg into the muscle every 30 (thirty) days.    . isosorbide mononitrate (IMDUR) 30 MG 24 hr tablet TAKE 1 TABLET BY  MOUTH EVERY DAY 90 tablet 3  . metoprolol succinate (TOPROL-XL) 25 MG 24 hr tablet TAKE 1 TABLET (25 MG TOTAL) BY MOUTH DAILY. TAKE WITH OR IMMEDIATELY FOLLOWING A MEAL. 90 tablet 3  . pantoprazole (PROTONIX) 40 MG tablet TAKE 1 TABLET BY MOUTH EVERY DAY 90 tablet 3  . polyethylene glycol (MIRALAX / GLYCOLAX) packet Take 17 g by mouth daily.    Marland Kitchen aluminum-magnesium hydroxide 200-200 MG/5ML suspension Take by mouth as needed for indigestion. (Patient not taking: Reported on 06/23/2020)    . ketoconazole (NIZORAL) 2 % cream Apply 1 application topically 2 (two) times daily. 60 g 1  . nitroGLYCERIN (NITROSTAT) 0.4 MG SL tablet PLACE 1 TABLET (0.4 MG TOTAL) UNDER THE TONGUE EVERY 5 (FIVE) MINUTES AS NEEDED FOR CHEST PAIN. (Patient not taking: Reported on 06/23/2020) 25 tablet 5   No current facility-administered medications for this visit.    Allergies  Allergen Reactions  . Adhesive [Tape] Rash    ONLY USE PAPER TAPE  . Iodine Rash    Rash when applied to skin  . Niacin And Related Hives and Other (See Comments)    FLUSHING    Family History  Problem Relation Age of Onset  . Breast cancer Mother   . Stroke Father   . Colon cancer Brother   . Stroke Brother  Social History   Socioeconomic History  . Marital status: Widowed    Spouse name: [name]  . Number of children: 3  . Years of education: B.D.  . Highest education level: Not on file  Occupational History  . Occupation: Land    Comment: Retired  Tobacco Use  . Smoking status: Never Smoker  . Smokeless tobacco: Never Used  Vaping Use  . Vaping Use: Never used  Substance and Sexual Activity  . Alcohol use: No  . Drug use: No  . Sexual activity: Not Currently  Other Topics Concern  . Not on file  Social History Narrative   Lives alone, at [facility].  Has some help with housecleaning but o/w he is independent.     Was married to first wife 75 years, then to second wife 12 years.  Widowed twice.         Retired Theme park manager, [employer]   One son was killed in [place] in 1984   One son and one daughter (she is an Therapist, sports) still alive in       Has living will   Daughter is health care POA   Has DNR    No feeding tube if cognitively unaware   Social Determinants of Radio broadcast assistant Strain: Not on file  Food Insecurity: Not on file  Transportation Needs: Not on file  Physical Activity: Not on file  Stress: Not on file  Social Connections: Not on file  Intimate Partner Violence: Not on file     Constitutional: Denies fever, malaise, fatigue, headache or abrupt weight changes.  Respiratory: Denies difficulty breathing, shortness of breath, cough or sputum production.   Cardiovascular: Denies chest pain, chest tightness, palpitations or swelling in the hands or feet.  Gastrointestinal: Denies abdominal pain, bloating, constipation, diarrhea or blood in the stool.  GU: Denies urgency, frequency, pain with urination, burning sensation, blood in urine, odor or discharge.  No other specific complaints in a complete review of systems (except as listed in HPI above).  Objective:   Physical Exam  BP 127/75   Pulse 74   Temp 97.8 F (36.6 C)   Resp 20   Wt 206 lb (93.4 kg)   SpO2 98%   BMI 35.36 kg/m  Wt Readings from Last 3 Encounters:  06/21/20 195 lb (88.5 kg)  06/23/20 206 lb (93.4 kg)  06/08/20 197 lb 6.4 oz (89.5 kg)    General: Appears his stated age, obese, in NAD. HEENT: Head: normal shape and size; Eyes: sclera white and EOMs intact;  Cardiovascular: Normal rate and rhythm. S1,S2 noted.  No murmur, rubs or gallops noted.  Pulmonary/Chest: Normal effort and positive vesicular breath sounds. No respiratory distress. No wheezes, rales or ronchi noted.  Abdomen: Soft and nontender. Normal bowel sounds. No distention or masses noted. Liver, spleen and kidneys non palpable. Neurological: Alert and oriented.    BMET    Component Value Date/Time   NA 135  04/01/2018 0520   K 3.9 01/01/2019 0000   CL 106 04/01/2018 0520   CO2 25 04/01/2018 0520   GLUCOSE 104 (H) 04/01/2018 0520   BUN 11 04/01/2018 0520   CREATININE 1.1 12/31/2018 0000   CREATININE 0.92 04/01/2018 0520   CALCIUM 8.4 (L) 04/01/2018 0520   GFRNONAA >60 04/01/2018 0520   GFRAA >60 04/01/2018 0520    Lipid Panel     Component Value Date/Time   CHOL 137 03/31/2018 0552   CHOL 138 12/16/2012 0837  TRIG 64 03/31/2018 0552   HDL 42 03/31/2018 0552   HDL 46 12/16/2012 0837   CHOLHDL 3.3 03/31/2018 0552   VLDL 13 03/31/2018 0552   LDLCALC 82 03/31/2018 0552   LDLCALC 75 12/16/2012 0837    CBC    Component Value Date/Time   WBC 8.1 04/02/2018 0341   RBC 4.12 (L) 04/02/2018 0341   HGB 14.1 12/31/2018 0000   HCT 38.1 (L) 04/02/2018 0341   PLT 174 04/02/2018 0341   MCV 92.5 04/02/2018 0341   MCH 30.6 04/02/2018 0341   MCHC 33.1 04/02/2018 0341   RDW 12.6 04/02/2018 0341   LYMPHSABS 1.6 03/31/2018 0223   MONOABS 0.7 03/31/2018 0223   EOSABS 0.2 03/31/2018 0223   BASOSABS 0.1 03/31/2018 0223    Hgb A1C Lab Results  Component Value Date   HGBA1C 5.8 (H) 05/27/2013           Assessment & Plan:   GERD:  Improved on Pantoprazole 40 mg BID Advised him to avoid overly spicy/greasy/tomato based foods  HTN, HLD, CAD, Ischemic Cardiomyopathy:  Cardiology note reviewed Continue Amlodipine, Atorvastatin, Plavix, Isosorbide, Metoprolol Has Nitro prn if needed Will monitor Pharmacy will start fixing his medications for him  Will reassess as needed Webb Silversmith, NP This visit occurred during the SARS-CoV-2 public health emergency.  Safety protocols were in place, including screening questions prior to the visit, additional usage of staff PPE, and extensive cleaning of exam room while observing appropriate contact time as indicated for disinfecting solutions.

## 2020-06-30 DIAGNOSIS — Z23 Encounter for immunization: Secondary | ICD-10-CM | POA: Diagnosis not present

## 2020-07-24 ENCOUNTER — Other Ambulatory Visit: Payer: Self-pay | Admitting: Family Medicine

## 2020-07-25 DIAGNOSIS — L570 Actinic keratosis: Secondary | ICD-10-CM | POA: Diagnosis not present

## 2020-07-25 DIAGNOSIS — D225 Melanocytic nevi of trunk: Secondary | ICD-10-CM | POA: Diagnosis not present

## 2020-07-25 DIAGNOSIS — L821 Other seborrheic keratosis: Secondary | ICD-10-CM | POA: Diagnosis not present

## 2020-07-25 DIAGNOSIS — D2272 Melanocytic nevi of left lower limb, including hip: Secondary | ICD-10-CM | POA: Diagnosis not present

## 2020-07-25 DIAGNOSIS — Z86006 Personal history of melanoma in-situ: Secondary | ICD-10-CM | POA: Diagnosis not present

## 2020-07-25 DIAGNOSIS — Z85828 Personal history of other malignant neoplasm of skin: Secondary | ICD-10-CM | POA: Diagnosis not present

## 2020-07-25 DIAGNOSIS — Z85820 Personal history of malignant melanoma of skin: Secondary | ICD-10-CM | POA: Diagnosis not present

## 2020-07-25 DIAGNOSIS — X32XXXA Exposure to sunlight, initial encounter: Secondary | ICD-10-CM | POA: Diagnosis not present

## 2020-07-27 ENCOUNTER — Ambulatory Visit (INDEPENDENT_AMBULATORY_CARE_PROVIDER_SITE_OTHER): Payer: Medicare Other | Admitting: Physician Assistant

## 2020-07-27 ENCOUNTER — Other Ambulatory Visit: Payer: Self-pay

## 2020-07-27 ENCOUNTER — Encounter: Payer: Self-pay | Admitting: Physician Assistant

## 2020-07-27 VITALS — BP 120/70 | HR 63 | Ht 64.0 in | Wt 199.2 lb

## 2020-07-27 DIAGNOSIS — I255 Ischemic cardiomyopathy: Secondary | ICD-10-CM | POA: Diagnosis not present

## 2020-07-27 DIAGNOSIS — I10 Essential (primary) hypertension: Secondary | ICD-10-CM | POA: Diagnosis not present

## 2020-07-27 DIAGNOSIS — Z79899 Other long term (current) drug therapy: Secondary | ICD-10-CM

## 2020-07-27 MED ORDER — FUROSEMIDE 20 MG PO TABS: ORAL_TABLET | ORAL | 5 refills | Status: DC

## 2020-07-27 NOTE — Progress Notes (Signed)
Office Visit    Patient Name: Jason Velazquez Date of Encounter: 07/27/2020  PCP:  Venia Carbon, MD   [place]  Cardiologist:  Nelva Bush, MD  Advanced Practice Provider:  No care team member to display Electrophysiologist:  None  Chief Complaint    Chief Complaint  Patient presents with   1 month follow up     Patient c/o LE edema. Medications reviewed by the patient's medication list from [facility]     87 y.o. male with history of CAD s/p CABG (1998; LIMA to LAD, SVG to D1-D2, SVG to RPL) and subsequent PCI to proximal LCx, stroke with residual memory deficits, carotid artery stenosis, hypertension, hyperlipidemia, and BPH, and who presents today for 1 month follow-up.  Past Medical History    Past Medical History:  Diagnosis Date   Allergic rhinitis    Benign prostatic hypertrophy    Coronary artery disease CARDIOLOGIST -- DR Daneen Schick   Duodenal diverticulum 2003   GERD (gastroesophageal reflux disease)    H/O hiatal hernia 2003   Heart murmur    Hepatic lesion 2004   History of melanoma excision    FACE   Hypercholesteremia    Hypertension    Left carotid stenosis    > 50%  PER DUPLEX  03-28-2011   Mild mitral regurgitation    Perianal fistula    S/P CABG x 4    1988   S/P drug eluting coronary stent placement    POST CABG--  STENTING 2000  AND RESTENTING 2006 FOR REINSTENT STENOSIS   Stroke [facility])    Past Surgical History:  Procedure Laterality Date   CATARACT EXTRACTION W/ INTRAOCULAR LENS  IMPLANT, BILATERAL     CORONARY ANGIOPLASTY WITH STENT PLACEMENT  2000   PCI AND STENTING CIRCUMFLEX   CORONARY ANGIOPLASTY WITH STENT PLACEMENT  04-30-2004  DR Daneen Schick   RE-INSTENT STENOSIS/  DRUG-ELUTING STENT OF PROXIMAL AND MID CIRCUMFLEX/ PCI MID CIRCUMFLEX THROUGH STENT STRUT/  WIDELY PATENT SAPHENOUS VEIN GRAFT AND  LIMA TO LAD GRAFT/ TOTAL OCCLUSION RIGHT CORONARY BEYOND THE POSTERIOR DESCENDING ARTERY  BRANCH WITH 50% OSTIAL NARROWING/ TOTAL OCCLUSION OF LAD AT THE OSTIUM OF THE LEFT MAIN   CORONARY ARTERY BYPASS GRAFT  1988   X5   EVALUATION UNDER ANESTHESIA WITH ANAL FISTULECTOMY N/A 07/22/2012   Procedure: EXAM UNDER ANESTHESIA WITH ANAL fistulotomy;  Surgeon: Leighton Ruff, MD;  Location: [place];  Service: General;  Laterality: N/A;   LEFT HEART CATH AND CORS/GRAFTS ANGIOGRAPHY N/A 03/31/2018   Procedure: LEFT HEART CATH AND CORS/GRAFTS ANGIOGRAPHY;  Surgeon: Nelva Bush, MD;  Location: [place] [facility];  Service: Cardiovascular;  Laterality: N/A;   SHOULDER ARTHROSCOPY WITH OPEN ROTATOR CUFF REPAIR Right 07-10-1999   TONSILLECTOMY AND ADENOIDECTOMY  1944   TRANSTHORACIC ECHOCARDIOGRAM  03-01-2011  DR Daneen Schick   NORMAL LVF AND LV SIZE/ MILD LEFT ATRIAL ENLARGEMENT/ GRADE II DIASTOLIC DYSFUNCTION WITH ELEVATED LEFT ATRIAL PRESSURE/ MILD TO MODERATE MR/ MILD TR   TRANSURETHRAL RESECTION OF PROSTATE  04/09/2011   Procedure: TRANSURETHRAL RESECTION OF THE PROSTATE WITH GYRUS INSTRUMENTS;  Surgeon: Malka So, MD;  Location: [facility];  Service: Urology;  Laterality: N/A;    Allergies  Allergies  Allergen Reactions   Tape Rash    ONLY USE PAPER TAPE ONLY USE PAPER TAPE   Iodine Rash    Rash when applied to skin   Niacin And Related Hives and  Other (See Comments)    FLUSHING   Niacin Itching and Rash    History of Present Illness    Jason Velazquez is a 87 y.o. male with PMH as above.  He has history of CAD s/p CABG (1998 with LIMA to LAD, SVG to D1-D2, SVG to RPL) and subsequent PCI to proximal left circumflex, stroke with residual memory deficits, carotid artery stenosis, hypertension, hyperlipidemia, and BPH.    He was hospitalized at [facility] 03/2018 with chest pain and elevated troponin, consistent with non-STEMI.  Catheterization showed acute/subsequent occlusion of sequential SVG to diagonal branches, which was medically managed.    He was seen again  06/21/2020 with worsening heartburn.  He described a burning sensation in the center of his chest, which had worsened over the last few weeks.  He had an episode that was severe the previous week before his visit and that occurred while waiting for his blood pressure to be checked at [facility].  He was given an antacid with improvement.  He was working about a mile a day and did not report any exertional symptoms of chest pain or shortness of breath.  He had mild chronic swelling of the left calf, stable.  He had not had any orthopnea, PND, palpitations, or lightheadedness.  It was thought that this could be an anginal equivalent, though not typical for his angina.  Recommendation was for escalation of PPI therapy and titration of antianginal medications, as well as repeat catheterization.  He wanted to trial PPI with pantoprazole 40 mg twice daily for 2 weeks to see if improvement in symptoms.  If not, increased Imdur was recommended.    Cardiac catheterization was reserved for refractory symptoms given that he was known severe native and graft disease and may be somewhat challenging to intervene upon percutaneously.    He was continued on clopidogrel, amlodipine, metoprolol, and Imdur.  He appeared euvolemic on exam.  BP well controlled.  He was continued on a statin.  Today, 07/27/2020, he returns to clinic and notes that his symptoms have significantly improved since escalation of PPI therapy as outlined above.  On review of his previous reported symptoms, he reports almost complete resolution of his previous symptoms.  He states that, if symptoms occur, they mainly occur with eating, and usually it just is eructation.  He reports burping every once in a while.  He does not feel the need to escalate his Imdur, as his symptoms do not concern him.  He denies any further burning in the center of his chest.  He denies any further severe episodes as outlined at his previous visits.  He is still  walking a mile a day 5:30AM-6AM without any anginal symptoms.  He reports ongoing mild chronic swelling of the left calf and stable from previous visits.  He denies any melena, hematochezia, or hemoptysis.  He does note his weight is up from previous weights, attributing this to eating more with resolution of his GERD symptoms.  He expresses some frustration regarding difficulty with urination.  He states that he has lately noticed that he is not able to urinate as well as he has in the past.  Sometimes, he has difficulty completing his stream.  At other times, he reports that his urine is difficulty to control.  We discussed that this may be his prostate with recommendation to reach out to Dr. Silvio Pate.  However, on physical exam, he is also slightly up on exam.  He does report a  high salt diet through his [facility] with orders placed today for no salt diet.  Orders also placed for daily weights and daily BP.  Sliding scale Lasix discussed.  He reports that they have been monitoring his BP at [facility] and that it has been elevated recently but is unable to recall SBP numbers other than 139.   Home Medications   Current Outpatient Medications  Medication Instructions   amLODipine (NORVASC) 5 mg, Oral, Daily   atorvastatin (LIPITOR) 80 MG tablet TAKE 1 TABLET BY MOUTH EVERYDAY AT BEDTIME   clopidogrel (PLAVIX) 75 MG tablet TAKE 1 TABLET BY MOUTH EVERY DAY   cyanocobalamin ((VITAMIN B-12)) 1,000 mcg, Intramuscular, Every 30 days   isosorbide mononitrate (IMDUR) 30 MG 24 hr tablet TAKE 1 TABLET BY MOUTH EVERY DAY   ketoconazole (NIZORAL) 2 % cream 1 application, Topical, 2 times daily   metoprolol succinate (TOPROL-XL) 25 mg, Oral, Daily, Take with or immediately following a meal.   nitroGLYCERIN (NITROSTAT) 0.4 mg, Sublingual, Every 5 min PRN   pantoprazole (PROTONIX) 40 MG tablet TAKE 1 TABLET BY MOUTH EVERY DAY   polyethylene glycol (MIRALAX / GLYCOLAX) 17 g, Daily     Review of  Systems    He denies chest pain, palpitations, dyspnea, pnd, orthopnea, n, v, dizziness, syncope, or early satiety.  He reports some ongoing symptoms of eructation with meals but denies any recurrence of his previous report of burning substernal chest discomfort as outlined above and since increasing his PPI.  He does report weight gain, attributed to increased diet 2/2 resolution of GERD but also noting increased salt intake/salty foods at his facility.  He notes ongoing/stable edema with left greater than right though does report his edema is greater today.   All other systems reviewed and are otherwise negative except as noted above.  Physical Exam    VS:  BP 120/70 (BP Location: Left Arm, Patient Position: Sitting, Cuff Size: Normal)   Pulse 63   Ht 5\' 4"  (1.626 m)   Wt 199 lb 4 oz (90.4 kg)   SpO2 98%   BMI 34.20 kg/m  , BMI Body mass index is 34.2 kg/m. GEN: Well nourished, well developed, in no acute distress. HEENT: normal. Neck: Supple, no JVD, carotid bruits, or masses. Cardiac: RRR, no murmurs, rubs, or gallops. No clubbing, cyanosis, 1-2+ bilateral lower extremity edema.  Radials/DP/PT 2+ and equal bilaterally.  Respiratory: Some accessory muscle use noted, respirations regular and unlabored, slightly reduced breath sounds bilaterally. GI: Firm, distended, BS + x 4. MS: no deformity or atrophy. Skin: warm and dry, no rash. Neuro:  Strength and sensation are intact. Psych: Normal affect.  Accessory Clinical Findings    ECG personally reviewed by me today - NSR, 63 bpm, ST/T changes as seen in previous EKGs in inferior leads II, 3, aVF and discussed with patient today, poor R wave progression in V1,- no acute changes.  VITALS Reviewed today   Temp Readings from Last 3 Encounters:  06/23/20 97.8 F (36.6 C)  06/08/20 97.8 F (36.6 C)  02/24/20 97.6 F (36.4 C)   BP Readings from Last 3 Encounters:  07/27/20 120/70  06/21/20 126/78  06/23/20 127/75   Pulse Readings  from Last 3 Encounters:  07/27/20 63  06/21/20 64  06/23/20 74    Wt Readings from Last 3 Encounters:  07/27/20 199 lb 4 oz (90.4 kg)  06/21/20 195 lb (88.5 kg)  06/23/20 206 lb (93.4 kg)  LABS  reviewed today   Lab Results  Component Value Date   WBC 8.1 04/02/2018   HGB 14.1 12/31/2018   HCT 38.1 (L) 04/02/2018   MCV 92.5 04/02/2018   PLT 174 04/02/2018   Lab Results  Component Value Date   CREATININE 1.1 12/31/2018   BUN 11 04/01/2018   NA 135 04/01/2018   K 3.9 01/01/2019   CL 106 04/01/2018   CO2 25 04/01/2018   Lab Results  Component Value Date   ALT 35 12/31/2018   AST 24 12/31/2018   ALKPHOS 70 02/19/2018   BILITOT 0.8 02/19/2018   Lab Results  Component Value Date   CHOL 137 03/31/2018   HDL 42 03/31/2018   LDLCALC 82 03/31/2018   TRIG 64 03/31/2018   CHOLHDL 3.3 03/31/2018    Lab Results  Component Value Date   HGBA1C 5.8 (H) 05/27/2013   Lab Results  Component Value Date   TSH 4.94 (H) 08/25/2017     STUDIES/PROCEDURES reviewed today   Echocardiogram 03/31/2018  1. The left ventricle has normal systolic function, with an ejection  fraction of 55-60%. The cavity size was normal. There is moderately  increased left ventricular wall thickness. Left ventricular diastolic  Doppler parameters are consistent with  impaired relaxation Indeterminent filling pressures.   2. The right ventricle has normal systolic function. The cavity was  normal. There is no increase in right ventricular wall thickness.   3. The mitral valve is degenerative. Moderate thickening of the mitral  valve leaflet. Mild calcification of the mitral valve leaflet.   4. The tricuspid valve is normal in structure. Tricuspid valve  regurgitation is mild-moderate.   5. The aortic valve is tricuspid Mild thickening of the aortic valve Mild  calcification of the aortic valve.   6. Mild hypokinesis of the left ventricular anterolateral wall.   7. The interatrial septum was  not well visualized.   LHC 03/31/2018 Conclusions: Severe three-vessel coronary artery disease including chronic total occlusion of the ostial and mid LAD, in-stent restenosis of overlapping proximal LCx stents complicated by stent fracture, sequential 80% OM2 stenoses, 80% jailed AV groove LCx stenosis, 70% proximal RCA lesion, sequential 60% and 70% ostial and distal RPDA stenoses and chronic total occlusion of the rPL. Widely patent LIMA to LAD and SVG to rPL. Occlusion of sequential SVG to diagonal branches.  I suspect this is the patient's culprit lesion with acute occlusion on chronic subtotal occlusion, given that collaterals have already developed. Mildly to moderately reduced left ventricular systolic function (EF 62%) with mid anterior akinesis. Mildly reduced left ventricular filling pressure. Recommendations: Given diffuse disease and likely acute total occlusion superimposed on subtotal occlusion of SVG to diagonal branches in a graft that is more than 85 years old, I favor medical therapy. Dual antiplatelet therapy with aspirin and clopidogrel for at least 12 months. Continue IV heparin for 48 hours. Obtain echocardiogram. Transition atenolol to metoprolol succinate 25 mg daily. If patient has recurrent chest pain, consider addition of long-acting nitrate.  If refractory symptoms occur, one would need to consider PCI to LAD, LCx, and or RCA.   Assessment & Plan    Coronary artery disease with stable angina and atypical chest pain Ischemic cardiomyopathy --No further chest burning as described at previous visits and since increasing his PPI therapy.  He has history of severe native CAD and bypass graft disease based on catheterization from 2020.  He reported burning chest pain without clear triggers but at times associated  with greasy foods.  This was atypical for his usual angina, though given his severe CAD, it was discussed that this could be his anginal equivalent.  PPI therapy  was escalated with pantoprazole 40 mg twice daily for 2 weeks.  Today, he reports almost complete resolution of his symptoms.  At times, and with certain foods, he has irritation.  Otherwise, he is asymptomatic.  He continues to walk 1 mile without any exertional symptoms.  We discussed the improvement in his symptoms and he is agreeable to reserve cardiac catheterization for refractory symptoms.  Given his improved symptoms, we will defer escalation of Imdur.  He does report some elevated pressures and worsening lower extremity edema, as well as some weight gain due to increased oral intake with salty foods at [facility].  He has also noticed some decreased urine output.  He does appear slightly volume up with weight increased from previous 195 pounds to 199 pounds today/over the last month.  We will check labs today with a BMET and start as needed Lasix after Lasix 20mg  x2d with strict orders given to [facility] regarding its use for only 3 pounds weight gain overnight or 5 pounds weight gain in 1 week with standing weights.  Also provided for limitations on when this can be administered based on SBP.  Orders placed for daily weights and daily BP.  Orders placed for follow-up BMET within 1 week.  We will defer catheterization, given known severe native and graft disease may be somewhat challenging to intervene upon percutaneously.  We will continue current clopidogrel, amlodipine, metoprolol, and Imdur.  Essential hypertension --Continue current medications.  BP well controlled, though he does note some elevated pressures/labile pressures at his facility lately.  HLD, LDL goal below 70 --Continue atorvastatin 80 mg daily.  Medication changes: Lasix 20mg  x2 days. Then, as needed Lasix with strict orders placed for daily weights, daily BP, and follow-up BMET.  Lasix only to be administered for weight gain 3 pounds overnight or 5 pounds in 1 week and use no more than 3 times in 1 week.  Low-salt diet  recommended. Labs ordered: BMET today, BMET at [place] / Imaging ordered: None Future considerations: Escalation of Imdur if needed Disposition: RTC 1- 2 months to reassess successful administration of Lasix at facility and repeat labs  *Please be aware that the above documentation was completed voice recognition software and may contain dictation errors.    Arvil Chaco, PA-C 07/27/2020

## 2020-07-27 NOTE — Patient Instructions (Signed)
Medication Instructions:  Your physician has recommended you make the following change in your medication:   START Furosemide (Lasix) 20mg  daily x 2 days, then take 1 tablet (20mg ) AS NEEDED for weight gain of 3 lbs overnight or 5 lbs in one week  *If you need a refill on your cardiac medications before your next appointment, please call your pharmacy*   Lab Work:  BMET in 1 week - to be done at [facility] (Please fax results to [phone])  Testing/Procedures:  None ordered    Follow-Up: At [facility], you and your health needs are our priority.  As part of our continuing mission to provide you with exceptional heart care, we have created designated Provider Care Teams.  These Care Teams include your primary Cardiologist (physician) and Advanced Practice Providers (APPs -  Physician Assistants and Nurse Practitioners) who all work together to provide you with the care you need, when you need it.  We recommend signing up for the patient portal called "MyChart".  Sign up information is provided on this After Visit Summary.  MyChart is used to connect with patients for Virtual Visits (Telemedicine).  Patients are able to view lab/test results, encounter notes, upcoming appointments, etc.  Non-urgent messages can be sent to your provider as well.   To learn more about what you can do with MyChart, go to [email or web address].    Your next appointment:   2 month(s)  The format for your next appointment:   In Person  Provider:   You may see Nelva Bush, MD or one of the following Advanced Practice Providers on your designated Care Team:    Marrianne Mood, PA-C   Other Instructions  Low salt / No salt added diet   Daily weights / Daily BP

## 2020-08-03 DIAGNOSIS — I2510 Atherosclerotic heart disease of native coronary artery without angina pectoris: Secondary | ICD-10-CM | POA: Diagnosis not present

## 2020-08-03 DIAGNOSIS — I214 Non-ST elevation (NSTEMI) myocardial infarction: Secondary | ICD-10-CM | POA: Diagnosis not present

## 2020-08-07 ENCOUNTER — Telehealth: Payer: Self-pay | Admitting: Physician Assistant

## 2020-08-07 ENCOUNTER — Encounter: Payer: Self-pay | Admitting: Physician Assistant

## 2020-08-07 NOTE — Telephone Encounter (Signed)
Placed on [place] desk to be reviewed.

## 2020-08-07 NOTE — Telephone Encounter (Signed)
Received fax with patient's weights and blood pressure recordings from [facility] facility. Placed in [place], [place] box in front office.

## 2020-08-10 ENCOUNTER — Telehealth: Payer: Self-pay

## 2020-08-10 ENCOUNTER — Encounter: Payer: Self-pay | Admitting: Internal Medicine

## 2020-08-10 ENCOUNTER — Ambulatory Visit: Payer: Medicare Other | Admitting: Internal Medicine

## 2020-08-10 DIAGNOSIS — R3914 Feeling of incomplete bladder emptying: Secondary | ICD-10-CM

## 2020-08-10 DIAGNOSIS — I5032 Chronic diastolic (congestive) heart failure: Secondary | ICD-10-CM | POA: Insufficient documentation

## 2020-08-10 DIAGNOSIS — I25119 Atherosclerotic heart disease of native coronary artery with unspecified angina pectoris: Secondary | ICD-10-CM | POA: Diagnosis not present

## 2020-08-10 DIAGNOSIS — G3184 Mild cognitive impairment, so stated: Secondary | ICD-10-CM

## 2020-08-10 DIAGNOSIS — N401 Enlarged prostate with lower urinary tract symptoms: Secondary | ICD-10-CM | POA: Diagnosis not present

## 2020-08-10 DIAGNOSIS — K219 Gastro-esophageal reflux disease without esophagitis: Secondary | ICD-10-CM

## 2020-08-10 NOTE — Assessment & Plan Note (Signed)
Echo shows diastolic dysfunction but normal EF Is on isosorbide Has furosemide for prn use (edema or weight gain)

## 2020-08-10 NOTE — Assessment & Plan Note (Signed)
Having increasing problems emptying  Will try tamsulosin

## 2020-08-10 NOTE — Progress Notes (Signed)
Subjective:    Patient ID: Jason Velazquez, male    DOB: Oct 21, 1928, 87 y.o.   MRN: [id number]  HPI Visit in [place] apartment for review of chronic health condiitons Reviewed status with Luellen Pucker RN  Recent cardiology visits Monitoring weight (fairly stable)---has furosemide for prn use Tries to walk a mile every morning No recent chest pain--but will get "strange feeling in upper chest at times" Has used one nitro--after walking around pond Some ankle edema No palpitations No dizziness  No recent heartburn on the protonix No dysphagia  Remains functionally independent--other than staff helping with meds Walks independently Rare urinary leakage. Continent of bowels  Some trouble completing urination Dribbles also Nocturia x 1 Interested in medication for this again  Current Outpatient Medications on File Prior to Visit  Medication Sig Dispense Refill   amLODipine (NORVASC) 5 MG tablet Take 1 tablet (5 mg total) by mouth daily. 90 tablet 3   atorvastatin (LIPITOR) 80 MG tablet TAKE 1 TABLET BY MOUTH EVERYDAY AT BEDTIME 90 tablet 2   clopidogrel (PLAVIX) 75 MG tablet TAKE 1 TABLET BY MOUTH EVERY DAY 90 tablet 3   cyanocobalamin (,VITAMIN B-12,) 1000 MCG/ML injection Inject 1,000 mcg into the muscle every 30 (thirty) days.     furosemide (LASIX) 20 MG tablet Take one tablet daily x 2 days, then take one tablet AS NEEDED for weight gain of 3 lbs overnight or 5 lbs in one week. 30 tablet 5   isosorbide mononitrate (IMDUR) 30 MG 24 hr tablet TAKE 1 TABLET BY MOUTH EVERY DAY 90 tablet 3   ketoconazole (NIZORAL) 2 % cream Apply 1 application topically 2 (two) times daily. 60 g 1   metoprolol succinate (TOPROL-XL) 25 MG 24 hr tablet TAKE 1 TABLET (25 MG TOTAL) BY MOUTH DAILY. TAKE WITH OR IMMEDIATELY FOLLOWING A MEAL. 90 tablet 3   nitroGLYCERIN (NITROSTAT) 0.4 MG SL tablet PLACE 1 TABLET (0.4 MG TOTAL) UNDER THE TONGUE EVERY 5 (FIVE) MINUTES AS NEEDED FOR CHEST PAIN. 25 tablet 5    pantoprazole (PROTONIX) 40 MG tablet TAKE 1 TABLET BY MOUTH EVERY DAY 90 tablet 3   polyethylene glycol (MIRALAX / GLYCOLAX) packet Take 17 g by mouth daily.     No current facility-administered medications on file prior to visit.    Allergies  Allergen Reactions   Tape Rash    ONLY USE PAPER TAPE ONLY USE PAPER TAPE   Iodine Rash    Rash when applied to skin   Niacin And Related Hives and Other (See Comments)    FLUSHING   Niacin Itching and Rash    Past Medical History:  Diagnosis Date   Allergic rhinitis    Benign prostatic hypertrophy    Coronary artery disease CARDIOLOGIST -- DR Daneen Schick   Duodenal diverticulum 2003   GERD (gastroesophageal reflux disease)    H/O hiatal hernia 2003   Heart murmur    Hepatic lesion 2004   History of melanoma excision    FACE   Hypercholesteremia    Hypertension    Left carotid stenosis    > 50%  PER DUPLEX  03-28-2011   Mild mitral regurgitation    Perianal fistula    S/P CABG x 4    1988   S/P drug eluting coronary stent placement    POST CABG--  STENTING 2000  AND RESTENTING 2006 FOR REINSTENT STENOSIS   Stroke [facility])     Past Surgical History:  Procedure Laterality Date   CATARACT EXTRACTION W/  INTRAOCULAR LENS  IMPLANT, BILATERAL     CORONARY ANGIOPLASTY WITH STENT PLACEMENT  2000   PCI AND STENTING CIRCUMFLEX   CORONARY ANGIOPLASTY WITH STENT PLACEMENT  04-30-2004  DR Daneen Schick   RE-INSTENT STENOSIS/  DRUG-ELUTING STENT OF PROXIMAL AND MID CIRCUMFLEX/ PCI MID [place] STRUT/  WIDELY PATENT SAPHENOUS VEIN GRAFT AND  LIMA TO LAD GRAFT/ TOTAL OCCLUSION RIGHT CORONARY BEYOND THE POSTERIOR DESCENDING ARTERY BRANCH WITH 50% OSTIAL NARROWING/ TOTAL OCCLUSION OF LAD AT THE OSTIUM OF THE LEFT MAIN   CORONARY ARTERY BYPASS GRAFT  1988   X5   EVALUATION UNDER ANESTHESIA WITH ANAL FISTULECTOMY N/A 07/22/2012   Procedure: EXAM UNDER ANESTHESIA WITH ANAL fistulotomy;  Surgeon: Leighton Ruff, MD;  Location: [place];  Service: General;  Laterality: N/A;   LEFT HEART CATH AND CORS/GRAFTS ANGIOGRAPHY N/A 03/31/2018   Procedure: LEFT HEART CATH AND CORS/GRAFTS ANGIOGRAPHY;  Surgeon: Nelva Bush, MD;  Location: [place] CV LAB;  Service: Cardiovascular;  Laterality: N/A;   SHOULDER ARTHROSCOPY WITH OPEN ROTATOR CUFF REPAIR Right 07-10-1999   TONSILLECTOMY AND ADENOIDECTOMY  1944   TRANSTHORACIC ECHOCARDIOGRAM  03-01-2011  DR Daneen Schick   NORMAL LVF AND LV SIZE/ MILD LEFT ATRIAL ENLARGEMENT/ GRADE II DIASTOLIC DYSFUNCTION WITH ELEVATED LEFT ATRIAL PRESSURE/ MILD TO MODERATE MR/ MILD TR   TRANSURETHRAL RESECTION OF PROSTATE  04/09/2011   Procedure: TRANSURETHRAL RESECTION OF THE PROSTATE WITH GYRUS INSTRUMENTS;  Surgeon: Malka So, MD;  Location[facility];  Service: Urology;  Laterality: N/A;    Family History  Problem Relation Age of Onset   Breast cancer Mother    Stroke Father    Colon cancer Brother    Stroke Brother     Social History   Socioeconomic History   Marital status: Widowed    Spouse name: [name]   Number of children: 3   Years of education: B.D.   Highest education level: Not on file  Occupational History   Occupation: Land    Comment: Retired  Tobacco Use   Smoking status: Never   Smokeless tobacco: Never  Vaping Use   Vaping Use: Never used  Substance and Sexual Activity   Alcohol use: No   Drug use: No   Sexual activity: Not Currently  Other Topics Concern   Not on file  Social History Narrative   Lives alone, at [place].  Has some help with housecleaning but o/w he is independent.     Was married to first wife 15 years, then to second wife 12 years.  Widowed twice.        Retired Theme park manager, [employer]   One son was killed in [place] in 1984   One son and one daughter (she is an Therapist, sports) still alive in       Has living will   Daughter is health care POA   Has DNR    No feeding tube if cognitively unaware    Social Determinants of Radio broadcast assistant Strain: Not on file  Food Insecurity: Not on file  Transportation Needs: Not on file  Physical Activity: Not on file  Stress: Not on file  Social Connections: Not on file  Intimate Partner Violence: Not on file   Review of Systems Appetite is very good ---being careful with eating now and has lost some of the weight he gained Sleeps well Bowels fine with the metamucil No regular back or joint pains    Objective:  Physical Exam Constitutional:      Appearance: Normal appearance.  Cardiovascular:     Rate and Rhythm: Normal rate and regular rhythm.     Heart sounds: No murmur heard.   No gallop.  Pulmonary:     Effort: Pulmonary effort is normal.     Breath sounds: Normal breath sounds. No wheezing or rales.  Abdominal:     Palpations: Abdomen is soft.     Tenderness: There is no abdominal tenderness.  Musculoskeletal:     Cervical back: Neck supple.     Comments: Fullness in calves but no true edema now  Lymphadenopathy:     Cervical: No cervical adenopathy.  Skin:    Findings: No rash.  Neurological:     Mental Status: He is alert.  Psychiatric:        Mood and Affect: Mood normal.        Behavior: Behavior normal.           Assessment & Plan:

## 2020-08-10 NOTE — Assessment & Plan Note (Signed)
Does have rare chest pain Stamina fairly good Continues on the isosorbide, metoprolol, clopidogrel, statin

## 2020-08-10 NOTE — Telephone Encounter (Signed)
Left detail message on VM of pt's recent results that was scanned in from outside provider, okay by DPR to LM, Blanche East, PA-C advised   "Labs show Cr mildly elevated and potassium at goal. Ensure hydration under 2Lper day and low salt diet under 2g per day and we can reassess at follow-up.    At this time, no further recommendations or medications changes, continue current medications, and sip on water for hydration every 15-10 mins to stay hydrated, but limit self to under 2 liters daily. Advised to call office for any concerns or questions, otherwise will see at next visit.

## 2020-08-10 NOTE — Assessment & Plan Note (Signed)
Quiet now on the protonix

## 2020-08-10 NOTE — Assessment & Plan Note (Signed)
" >>  ASSESSMENT AND PLAN FOR CHRONIC DIASTOLIC HEART FAILURE (HCC) WRITTEN ON 08/10/2020 11:50 AM BY Henley Boettner I, MD  Echo shows diastolic dysfunction but normal EF Is on isosorbide  Has furosemide  for prn use (edema or weight gain) "

## 2020-08-10 NOTE — Assessment & Plan Note (Signed)
No progression (may even be better since being in [place]) Still functionally independent

## 2020-09-26 ENCOUNTER — Other Ambulatory Visit
Admission: RE | Admit: 2020-09-26 | Discharge: 2020-09-26 | Disposition: A | Discharge status: Home / Self Care | Payer: Medicare Other | Attending: Physician Assistant | Admitting: Physician Assistant

## 2020-09-26 ENCOUNTER — Ambulatory Visit (INDEPENDENT_AMBULATORY_CARE_PROVIDER_SITE_OTHER): Payer: Medicare Other | Admitting: Physician Assistant

## 2020-09-26 ENCOUNTER — Other Ambulatory Visit: Payer: Self-pay

## 2020-09-26 ENCOUNTER — Encounter: Payer: Self-pay | Admitting: Physician Assistant

## 2020-09-26 VITALS — BP 130/80 | HR 68 | Ht 64.0 in | Wt 205.0 lb

## 2020-09-26 DIAGNOSIS — I10 Essential (primary) hypertension: Secondary | ICD-10-CM

## 2020-09-26 DIAGNOSIS — I5033 Acute on chronic diastolic (congestive) heart failure: Secondary | ICD-10-CM

## 2020-09-26 DIAGNOSIS — E785 Hyperlipidemia, unspecified: Secondary | ICD-10-CM

## 2020-09-26 DIAGNOSIS — I25119 Atherosclerotic heart disease of native coronary artery with unspecified angina pectoris: Secondary | ICD-10-CM

## 2020-09-26 DIAGNOSIS — I255 Ischemic cardiomyopathy: Secondary | ICD-10-CM | POA: Diagnosis not present

## 2020-09-26 LAB — COMPREHENSIVE METABOLIC PANEL
ALT: 17 U/L (ref 0–44)
AST: 23 U/L (ref 15–41)
Albumin: 4 g/dL (ref 3.5–5.0)
Alkaline Phosphatase: 79 U/L (ref 38–126)
Anion gap: 9 (ref 5–15)
BUN: 14 mg/dL (ref 8–23)
CO2: 24 mmol/L (ref 22–32)
Calcium: 8.8 mg/dL — ABNORMAL LOW (ref 8.9–10.3)
Chloride: 99 mmol/L (ref 98–111)
Creatinine, Ser: 1.29 mg/dL — ABNORMAL HIGH (ref 0.61–1.24)
GFR, Estimated: 53 mL/min — ABNORMAL LOW (ref >60)
Glucose, Bld: 126 mg/dL — ABNORMAL HIGH (ref 70–99)
Potassium: 4.4 mmol/L (ref 3.5–5.1)
Sodium: 132 mmol/L — ABNORMAL LOW (ref 135–145)
Total Bilirubin: 0.8 mg/dL (ref 0.3–1.2)
Total Protein: 6.8 g/dL (ref 6.5–8.1)

## 2020-09-26 LAB — BRAIN NATRIURETIC PEPTIDE: B Natriuretic Peptide: 83.6 pg/mL (ref 0.0–100.0)

## 2020-09-26 MED ORDER — FUROSEMIDE 20 MG PO TABS: 20.0000 mg | ORAL_TABLET | Freq: Every day | ORAL | 5 refills | Status: DC

## 2020-09-26 NOTE — Progress Notes (Addendum)
Office Visit    Patient Name: Brodie Velazquez Date of Encounter: 09/26/2020  PCP:  Venia Carbon, MD   [facility] [place]  Cardiologist:  Nelva Bush, MD  Advanced Practice Provider:  No care team member to display Electrophysiologist:  None  Chief Complaint    Chief Complaint  Patient presents with   Follow-up    2 month F/U-No new cardiac concerns     87 y.o. male with history of CAD s/p CABG (1998; LIMA to LAD, SVG to D1-D2, SVG to RPL) and subsequent PCI to proximal LCx, stroke with residual memory deficits, carotid artery stenosis, hypertension, hyperlipidemia, and BPH, and who presents today for 1 month follow-up.  Past Medical History    Past Medical History:  Diagnosis Date   Allergic rhinitis    Benign prostatic hypertrophy    Coronary artery disease CARDIOLOGIST -- DR Daneen Schick   Duodenal diverticulum 2003   GERD (gastroesophageal reflux disease)    H/O hiatal hernia 2003   Heart murmur    Hepatic lesion 2004   History of melanoma excision    FACE   Hypercholesteremia    Hypertension    Left carotid stenosis    > 50%  PER DUPLEX  03-28-2011   Mild mitral regurgitation    Perianal fistula    S/P CABG x 4    1988   S/P drug eluting coronary stent placement    POST CABG--  STENTING 2000  AND RESTENTING 2006 FOR REINSTENT STENOSIS   Stroke [facility])    Past Surgical History:  Procedure Laterality Date   CATARACT EXTRACTION W/ INTRAOCULAR LENS  IMPLANT, BILATERAL     CORONARY ANGIOPLASTY WITH STENT PLACEMENT  2000   PCI AND STENTING CIRCUMFLEX   CORONARY ANGIOPLASTY WITH STENT PLACEMENT  04-30-2004  DR Daneen Schick   RE-INSTENT STENOSIS/  DRUG-ELUTING STENT OF PROXIMAL AND MID CIRCUMFLEX/ PCI MID CIRCUMFLEX THROUGH STENT STRUT/  WIDELY PATENT SAPHENOUS VEIN GRAFT AND  LIMA TO LAD GRAFT/ TOTAL OCCLUSION RIGHT CORONARY BEYOND THE POSTERIOR DESCENDING ARTERY BRANCH WITH 50% OSTIAL NARROWING/ TOTAL OCCLUSION OF LAD AT THE OSTIUM OF THE  LEFT MAIN   CORONARY ARTERY BYPASS GRAFT  1988   X5   EVALUATION UNDER ANESTHESIA WITH ANAL FISTULECTOMY N/A 07/22/2012   Procedure: EXAM UNDER ANESTHESIA WITH ANAL fistulotomy;  Surgeon: Leighton Ruff, MD;  Location: [place];  Service: General;  Laterality: N/A;   LEFT HEART CATH AND CORS/GRAFTS ANGIOGRAPHY N/A 03/31/2018   Procedure: LEFT HEART CATH AND CORS/GRAFTS ANGIOGRAPHY;  Surgeon: Nelva Bush, MD;  Location: [place] [facility];  Service: Cardiovascular;  Laterality: N/A;   SHOULDER ARTHROSCOPY WITH OPEN ROTATOR CUFF REPAIR Right 07-10-1999   TONSILLECTOMY AND ADENOIDECTOMY  1944   TRANSTHORACIC ECHOCARDIOGRAM  03-01-2011  DR Daneen Schick   NORMAL LVF AND LV SIZE/ MILD LEFT ATRIAL ENLARGEMENT/ GRADE II DIASTOLIC DYSFUNCTION WITH ELEVATED LEFT ATRIAL PRESSURE/ MILD TO MODERATE MR/ MILD TR   TRANSURETHRAL RESECTION OF PROSTATE  04/09/2011   Procedure: TRANSURETHRAL RESECTION OF THE PROSTATE WITH GYRUS INSTRUMENTS;  Surgeon: Malka So, MD;  [facility];  Service: Urology;  Laterality: N/A;    Allergies  Allergies  Allergen Reactions   Tape Rash    ONLY USE PAPER TAPE ONLY USE PAPER TAPE   Iodine Rash    Rash when applied to skin   Niacin And Related Hives and Other (See Comments)    FLUSHING itching    History of Present  Illness    Jason Velazquez is a 87 y.o. male with PMH as above.  He has history of CAD s/p CABG (1998 with LIMA to LAD, SVG to D1-D2, SVG to RPL) and subsequent PCI to proximal left circumflex, stroke with residual memory deficits, carotid artery stenosis, hypertension, hyperlipidemia, and BPH.    He was hospitalized at [facility] 03/2018 with chest pain and elevated troponin, consistent with non-STEMI.  Catheterization showed acute/subsequent occlusion of sequential SVG to diagonal branches, which was medically managed.    He was seen again 06/21/2020 with worsening heartburn.  He described a burning sensation in the center of his chest,  which had worsened over the last few weeks.  He had an episode that was severe the previous week before his visit and that occurred while waiting for his blood pressure to be checked at [facility].  He was given an antacid with improvement.  He was working about a mile a day and did not report any exertional symptoms of chest pain or shortness of breath.  He had mild chronic swelling of the left calf, stable.  He had not had any orthopnea, PND, palpitations, or lightheadedness.  It was thought that this could be an anginal equivalent, though not typical for his angina.  Recommendation was for escalation of PPI therapy and titration of antianginal medications, as well as repeat catheterization.  He wanted to trial PPI with pantoprazole 40 mg twice daily for 2 weeks to see if improvement in symptoms.  If not, increased Imdur was recommended.    Cardiac catheterization was reserved for refractory symptoms given that he was known severe native and graft disease and may be somewhat challenging to intervene upon percutaneously.    He was continued on clopidogrel, amlodipine, metoprolol, and Imdur.  He appeared euvolemic on exam.  BP well controlled.  He was continued on a statin.  Seen 07/27/2020 and reported improvement in his previous reflux symptoms with escalation of PPI.  No further burning in his chest.  If symptoms occurred, they mainly occur during eating and were mainly eructation. He was still walking a mile a day 5:30AM-6AM without any anginal symptoms.  He had ongoing mild chronic swelling of the left calf, stable from previous visits.  Weight gain was noted.  He expressed some frustration regarding difficulty with urination.  He reported high salt diet through his [facility] with orders placed today for no salt diet.  Orders placed for daily weights and daily BP.  Sliding scale Lasix discussed with start of Lasix 20 mg daily.  He reported elevated BP at [facility], though  unable to recall SBP numbers other than 139.  Today, 09/26/2020, he returns to clinic and notes intermittent lower extremity edema and some elevated pressures.  At times, he feels a sharp pain across his chest that occurs both with exertion and at rest.  He denies any clear triggers for the sharp pain, which only lasts a second.  This pain occurred once or twice last month.  The pain does not consistently occur with walking.  He continues to walk 1 mile every morning, except on Sunday.  He states that he is still likely eating a lot of salt with low salt diet encouraged.  He denies any shortness of breath or exertional dyspnea.  No orthopnea or PND.  No early satiety.  No presyncope or syncope.  No abdominal distention noted, though significant firmness noted on exam, as well as lower extremity edema.  He does report some discomfort in his legs.  He denies any recurrence of his chest pain.  No chest burning with eating.  On review of [facility] paperwork, furosemide has been discontinued.  08/03/2020 labs with creatinine 1.27, sodium 134, potassium 4.0, CO2 29, glucose 104 weight was monitored in early June and 194 to 196 pounds.  SBP 156 to 105/80 5-66 in the month of May and June.  It was unclear if he had started Lasix, so [facility] was contacted and stated that he had a dose 8/10 but that they cannot monitor when or how often he has taken the lasix, though it was noted he was not taking it frequently.   Home Medications   Current Outpatient Medications  Medication Instructions   amLODipine (NORVASC) 5 mg, Oral, Daily   aspirin EC 81 mg, Oral, Daily, Swallow whole.   atorvastatin (LIPITOR) 80 MG tablet TAKE 1 TABLET BY MOUTH EVERYDAY AT BEDTIME   clopidogrel (PLAVIX) 75 MG tablet TAKE 1 TABLET BY MOUTH EVERY DAY   cyanocobalamin ((VITAMIN B-12)) 1,000 mcg, Intramuscular, Every 30 days   isosorbide mononitrate (IMDUR) 30 MG 24 hr tablet TAKE 1 TABLET BY MOUTH EVERY DAY   ketoconazole (NIZORAL) 2 %  cream 1 application, Topical, 2 times daily   metoprolol succinate (TOPROL-XL) 25 mg, Oral, Daily, Take with or immediately following a meal.   nitroGLYCERIN (NITROSTAT) 0.4 mg, Sublingual, Every 5 min PRN   pantoprazole (PROTONIX) 40 MG tablet TAKE 1 TABLET BY MOUTH EVERY DAY   polyethylene glycol (MIRALAX / GLYCOLAX) 17 g, Daily   tamsulosin (FLOMAX) 0.4 mg, Oral, Daily     Review of Systems    He palpitations, dyspnea, pnd, orthopnea, n, v, dizziness, syncope, or early satiety. He has brief sharp CP without clear triggers as above. He has weight gain and notes increased salt intake/salty foods at his facility.  He notes progressive edema.   All other systems reviewed and are otherwise negative except as noted above.  Physical Exam    VS:  BP 130/80 (BP Location: Left Arm, Patient Position: Sitting, Cuff Size: Large)   Pulse 68   Ht '5\' 4"'$  (1.626 m)   Wt 205 lb (93 kg)   SpO2 97%   BMI 35.19 kg/m  , BMI Body mass index is 35.19 kg/m. GEN: Well nourished, well developed, in no acute distress. HEENT: normal. Neck: Supple, no JVD, carotid bruits, or masses. Cardiac: RRR, no murmurs, rubs, or gallops. No clubbing, cyanosis, 1-2+ bilateral lower extremity edema.  Radials/DP/PT 2+ and equal bilaterally.  Respiratory: Some accessory muscle use noted, respirations regular and unlabored GI: Firm, distended, BS + x 4. MS: no deformity or atrophy. Skin: warm and dry, no rash. Neuro:  Strength and sensation are intact. Psych: Normal affect.  Accessory Clinical Findings    ECG personally reviewed by me today - No EKG.  VITALS Reviewed today   Temp Readings from Last 3 Encounters:  08/10/20 (!) 97.5 F (36.4 C)  06/23/20 97.8 F (36.6 C)  06/08/20 97.8 F (36.6 C)   BP Readings from Last 3 Encounters:  09/26/20 130/80  08/10/20 114/64  07/27/20 120/70   Pulse Readings from Last 3 Encounters:  09/26/20 68  08/10/20 60  07/27/20 63    Wt Readings from Last 3 Encounters:   09/26/20 205 lb (93 kg)  08/10/20 192 lb 8 oz (87.3 kg)  07/27/20 199 lb 4 oz (90.4 kg)     LABS  reviewed today  Lab Results  Component Value Date   WBC 8.1 04/02/2018   HGB 14.1 12/31/2018   HCT 38.1 (L) 04/02/2018   MCV 92.5 04/02/2018   PLT 174 04/02/2018   Lab Results  Component Value Date   CREATININE 1.1 12/31/2018   BUN 11 04/01/2018   NA 135 04/01/2018   K 3.9 01/01/2019   CL 106 04/01/2018   CO2 25 04/01/2018   Lab Results  Component Value Date   ALT 35 12/31/2018   AST 24 12/31/2018   ALKPHOS 70 02/19/2018   BILITOT 0.8 02/19/2018   Lab Results  Component Value Date   CHOL 137 03/31/2018   HDL 42 03/31/2018   LDLCALC 82 03/31/2018   TRIG 64 03/31/2018   CHOLHDL 3.3 03/31/2018    Lab Results  Component Value Date   HGBA1C 5.8 (H) 05/27/2013   Lab Results  Component Value Date   TSH 4.94 (H) 08/25/2017     STUDIES/PROCEDURES reviewed today   Echocardiogram 03/31/2018  1. The left ventricle has normal systolic function, with an ejection  fraction of 55-60%. The cavity size was normal. There is moderately  increased left ventricular wall thickness. Left ventricular diastolic  Doppler parameters are consistent with  impaired relaxation Indeterminent filling pressures.   2. The right ventricle has normal systolic function. The cavity was  normal. There is no increase in right ventricular wall thickness.   3. The mitral valve is degenerative. Moderate thickening of the mitral  valve leaflet. Mild calcification of the mitral valve leaflet.   4. The tricuspid valve is normal in structure. Tricuspid valve  regurgitation is mild-moderate.   5. The aortic valve is tricuspid Mild thickening of the aortic valve Mild  calcification of the aortic valve.   6. Mild hypokinesis of the left ventricular anterolateral wall.   7. The interatrial septum was not well visualized.   LHC 03/31/2018 Conclusions: Severe three-vessel coronary artery disease  including chronic total occlusion of the ostial and mid LAD, in-stent restenosis of overlapping proximal LCx stents complicated by stent fracture, sequential 80% OM2 stenoses, 80% jailed AV groove LCx stenosis, 70% proximal RCA lesion, sequential 60% and 70% ostial and distal RPDA stenoses and chronic total occlusion of the rPL. Widely patent LIMA to LAD and SVG to rPL. Occlusion of sequential SVG to diagonal branches.  I suspect this is the patient's culprit lesion with acute occlusion on chronic subtotal occlusion, given that collaterals have already developed. Mildly to moderately reduced left ventricular systolic function ([id number]) with mid anterior akinesis. Mildly reduced left ventricular filling pressure. Recommendations: Given diffuse disease and likely acute total occlusion superimposed on subtotal occlusion of SVG to diagonal branches in a graft that is more than 85 years old, I favor medical therapy. Dual antiplatelet therapy with aspirin and clopidogrel for at least 12 months. Continue IV heparin for 48 hours. Obtain echocardiogram. Transition atenolol to metoprolol succinate 25 mg daily. If patient has recurrent chest pain, consider addition of long-acting nitrate.  If refractory symptoms occur, one would need to consider PCI to LAD, LCx, and or RCA.   Assessment & Plan    Coronary artery disease with stable angina and atypical chest pain Ischemic cardiomyopathy --Atypical sharp CP as above. No further chest burning s/p PPI therapy was escalated with pantoprazole 40 mg twice daily for 2 weeks.  He continues to walk 1 mile without any exertional symptoms.  Ongoing plan to reserve cardiac catheterization for refractory symptoms.  Reports ongoing worsening lower extremity edema,  as well as some weight gain.  He is volume up with weight increased from previous 195  199  205lbs.  We will check labs today with a BMET, BNP and restart lasix '20mg'$  daily with recommendation for repeat BMET, BNP in  1 week.  We may need to add potassium after repeat labs or labs today -we will call [facility] if this is the case.  Daily standing wt, BP checks recommended. We will defer catheterization, given known severe native and graft disease may be somewhat challenging to intervene upon percutaneously.  We will continue current clopidogrel, amlodipine, metoprolol, and Imdur.  Essential hypertension --Continue current medications.  BP elevated with BP at [facility] also reviewed.  HLD, LDL goal below 70 --Continue atorvastatin 80 mg daily. Will check lipids.   Medication changes: Lasix '20mg'$  daily.  Low-salt diet recommended. Labs ordered: BMET/BNP/dLDL lipids today, BMET/BNP at [facility] in 1-2 weeks Studies / Imaging ordered: None Future considerations: ?Volume status. Consider holding amlodipine if needed for diuresis. Disposition: RTC 4-6 mo unless ongoing issues with volume status, at which time he should return sooner.  Will fax my office note to [facility], as well as requested labs.   *Please be aware that the above documentation was completed voice recognition software and may contain dictation errors.    Arvil Chaco, PA-C 09/26/2020

## 2020-09-26 NOTE — Addendum Note (Signed)
Addended by: Marrianne Mood D on: 09/26/2020 05:01 PM   Modules accepted: Orders

## 2020-09-26 NOTE — Patient Instructions (Signed)
Medication Instructions:   Your physician has recommended you make the following change in your medication:   RESTART Furosemide 20 mg daily  *If you need a refill on your cardiac medications before your next appointment, please call your pharmacy*   Lab Work:  1) TODAY in our office: BNP, CMET  2) Please have labs completed at [facility] in 1-2 weeks: BMET, BNP  Testing/Procedures:  None ordered   Follow-Up: At [facility], you and your health needs are our priority.  As part of our continuing mission to provide you with exceptional heart care, we have created designated Provider Care Teams.  These Care Teams include your primary Cardiologist (physician) and Advanced Practice Providers (APPs -  Physician Assistants and Nurse Practitioners) who all work together to provide you with the care you need, when you need it.  We recommend signing up for the patient portal called "MyChart".  Sign up information is provided on this After Visit Summary.  MyChart is used to connect with patients for Virtual Visits (Telemedicine).  Patients are able to view lab/test results, encounter notes, upcoming appointments, etc.  Non-urgent messages can be sent to your provider as well.   To learn more about what you can do with MyChart, go to [email or web address].    Your next appointment:   4 - 6 month(s)  The format for your next appointment:   In Person  Provider:   You may see Nelva Bush, MD or one of the following Advanced Practice Providers on your designated Care Team:   Murray Hodgkins, NP Christell Faith, PA-C Marrianne Mood, PA-C Cadence Kathlen Mody, [place]   Other Instructions  Continue to monitor BP/ daily weight

## 2020-09-27 ENCOUNTER — Telehealth: Payer: Self-pay | Admitting: *Deleted

## 2020-09-27 NOTE — Telephone Encounter (Signed)
-----   Message from Arvil Chaco, PA-C sent at 09/27/2020  8:01 AM EDT ----- Labs show stable kidney function and electrolytes from scanned lab report 08/03/20.  Please make sure they continue to check BP / weight on a daily basis.  If SBP 115 or less, hold his lasix that day.    I faxed over his office visit note & orders for repeat BMET/BNP in 1 week to his facility yesterday.

## 2020-09-27 NOTE — Telephone Encounter (Signed)
Left voicemail message to call back for review of results and recommendations.  

## 2020-09-28 NOTE — Telephone Encounter (Signed)
Left voicemail message to call back regarding patient.

## 2020-09-28 NOTE — Telephone Encounter (Signed)
Luellen Pucker returned our call about results and recommendations. She states there is no way for patient to manage daily weights, weight checks, and be able to hold any medications because he is in assisted living. Medications are done weekly in pill pack. Luellen Pucker states that he is not able to manage complex orders. She states that his blood pressures are usually around SBP 140's with only 2 low readings in over 2 months. She did acknowledge repeat labs that were ordered but feels he is not able to manage the other instructions. Advised I would update provider on this and inquired if APP should need to call her is there a number she should call. She provided her cell number which is [phone]. Will send over to provider and make her aware to check message.

## 2020-09-28 NOTE — Telephone Encounter (Signed)
Facility returning call.  Please call cell.  [phone]

## 2020-09-28 NOTE — Telephone Encounter (Signed)
Jason Velazquez returning call requesting to be called on cell phone [phone]

## 2020-09-28 NOTE — Progress Notes (Signed)
See telephone encounter. Results reviewed with Charge Nurse at facility and orders reviewed.

## 2020-09-28 NOTE — Telephone Encounter (Addendum)
Called pt and left detailed message regarding lab results. DPR approved to LDM. Called charge nurse, Austin Miles, RN at [facility] assisted living to make aware of new orders re holding Lasix for SBP 115 or less. Lmtcb to disuss.   Audrey's direct number is [phone].

## 2020-10-05 ENCOUNTER — Encounter: Payer: Self-pay | Admitting: Internal Medicine

## 2020-10-05 DIAGNOSIS — I10 Essential (primary) hypertension: Secondary | ICD-10-CM | POA: Diagnosis not present

## 2020-10-05 DIAGNOSIS — I255 Ischemic cardiomyopathy: Secondary | ICD-10-CM | POA: Diagnosis not present

## 2020-10-05 DIAGNOSIS — I5032 Chronic diastolic (congestive) heart failure: Secondary | ICD-10-CM | POA: Diagnosis not present

## 2020-10-05 DIAGNOSIS — I25119 Atherosclerotic heart disease of native coronary artery with unspecified angina pectoris: Secondary | ICD-10-CM | POA: Diagnosis not present

## 2020-11-03 DIAGNOSIS — Z23 Encounter for immunization: Secondary | ICD-10-CM | POA: Diagnosis not present

## 2020-12-01 ENCOUNTER — Non-Acute Institutional Stay: Payer: Medicare Other | Admitting: Internal Medicine

## 2020-12-01 VITALS — BP 120/74 | HR 68 | Temp 98.5°F | Resp 20 | Wt 206.7 lb

## 2020-12-01 DIAGNOSIS — N1831 Chronic kidney disease, stage 3a: Secondary | ICD-10-CM

## 2020-12-01 DIAGNOSIS — G3184 Mild cognitive impairment, so stated: Secondary | ICD-10-CM | POA: Diagnosis not present

## 2020-12-01 DIAGNOSIS — N401 Enlarged prostate with lower urinary tract symptoms: Secondary | ICD-10-CM | POA: Diagnosis not present

## 2020-12-01 DIAGNOSIS — R3914 Feeling of incomplete bladder emptying: Secondary | ICD-10-CM | POA: Diagnosis not present

## 2020-12-01 DIAGNOSIS — I5032 Chronic diastolic (congestive) heart failure: Secondary | ICD-10-CM | POA: Diagnosis not present

## 2020-12-01 DIAGNOSIS — K219 Gastro-esophageal reflux disease without esophagitis: Secondary | ICD-10-CM | POA: Diagnosis not present

## 2020-12-01 DIAGNOSIS — E785 Hyperlipidemia, unspecified: Secondary | ICD-10-CM

## 2020-12-01 DIAGNOSIS — I10 Essential (primary) hypertension: Secondary | ICD-10-CM

## 2020-12-01 DIAGNOSIS — I255 Ischemic cardiomyopathy: Secondary | ICD-10-CM

## 2020-12-01 DIAGNOSIS — I25119 Atherosclerotic heart disease of native coronary artery with unspecified angina pectoris: Secondary | ICD-10-CM | POA: Diagnosis not present

## 2020-12-01 NOTE — Progress Notes (Signed)
Subjective:    Patient ID: Jason Velazquez, male    DOB: May 06, 1928, 87 y.o.   MRN: [id number]  HPI  Resident seen in [address] No new concerns from staff, resident reports he has been having intermittent chest pain. He reports this occurs about every 2 weeks. He describes the pain as sharp at first and then achy. The pain lasts for about 30 secs and resolves without intervention. He denies associated sweating, vision changes, SOB or dizziness. He reports it does feel like when he was having his heart attack. He has reflux but reports this feel different. The pain does not occur with exertion. Otherwise he reports he sleeps well. He walks without a device. His appetite is too good- reports he has gained some weight. He denies urinary leakage. His bowels are moving normally. He denies joint pain, SOB or reflux. He reports his mood is great!  HTN with Ischemic Cardiomyopathy: Controlled with Amlodipine, Metoprolol and Furosemide.  HLD with CAD: He denies myalgias on Atorvastatin. He is taking Metoprolol, Isosorbide, ASA and Plavix as prescribed. He follows with Dr. Saunders Revel.  BPH: He denies any issues on Flomax.  GERD: He denies breakthrough on Pantoprazole.  CHF: He denies chronic cough, shortness of breath or lower extremity edema.  MCI: Mild cognitive, stable functional needs. No meds.  CKD 3: His last creatinine was 1.29, GFR 53, 09/2020. He is not currently on an ACEI or ARB.  Review of Systems     Past Medical History:  Diagnosis Date   Allergic rhinitis    Benign prostatic hypertrophy    Coronary artery disease CARDIOLOGIST -- DR Daneen Schick   Duodenal diverticulum 2003   GERD (gastroesophageal reflux disease)    H/O hiatal hernia 2003   Heart murmur    Hepatic lesion 2004   History of melanoma excision    FACE   Hypercholesteremia    Hypertension    Left carotid stenosis    > 50%  PER DUPLEX  03-28-2011   Mild mitral regurgitation    Perianal fistula    S/P CABG x 4     1988   S/P drug eluting coronary stent placement    POST CABG--  STENTING 2000  AND RESTENTING 2006 FOR REINSTENT STENOSIS   Stroke [facility])     Current Outpatient Medications  Medication Sig Dispense Refill   amLODipine (NORVASC) 5 MG tablet Take 1 tablet (5 mg total) by mouth daily. 90 tablet 3   aspirin EC 81 MG tablet Take 81 mg by mouth daily. Swallow whole.     atorvastatin (LIPITOR) 80 MG tablet TAKE 1 TABLET BY MOUTH EVERYDAY AT BEDTIME 90 tablet 2   clopidogrel (PLAVIX) 75 MG tablet TAKE 1 TABLET BY MOUTH EVERY DAY 90 tablet 3   cyanocobalamin (,VITAMIN B-12,) 1000 MCG/ML injection Inject 1,000 mcg into the muscle every 30 (thirty) days.     furosemide (LASIX) 20 MG tablet Take 1 tablet (20 mg total) by mouth daily. 30 tablet 5   isosorbide mononitrate (IMDUR) 30 MG 24 hr tablet TAKE 1 TABLET BY MOUTH EVERY DAY 90 tablet 3   ketoconazole (NIZORAL) 2 % cream Apply 1 application topically 2 (two) times daily. 60 g 1   metoprolol succinate (TOPROL-XL) 25 MG 24 hr tablet TAKE 1 TABLET (25 MG TOTAL) BY MOUTH DAILY. TAKE WITH OR IMMEDIATELY FOLLOWING A MEAL. 90 tablet 3   nitroGLYCERIN (NITROSTAT) 0.4 MG SL tablet PLACE 1 TABLET (0.4 MG TOTAL) UNDER THE TONGUE EVERY 5 (FIVE)  MINUTES AS NEEDED FOR CHEST PAIN. 25 tablet 5   pantoprazole (PROTONIX) 40 MG tablet TAKE 1 TABLET BY MOUTH EVERY DAY 90 tablet 3   polyethylene glycol (MIRALAX / GLYCOLAX) packet Take 17 g by mouth daily.     tamsulosin (FLOMAX) 0.4 MG CAPS capsule Take 0.4 mg by mouth daily.     No current facility-administered medications for this visit.    Allergies  Allergen Reactions   Tape Rash    ONLY USE PAPER TAPE ONLY USE PAPER TAPE   Iodine Rash    Rash when applied to skin   Niacin And Related Hives and Other (See Comments)    FLUSHING itching    Family History  Problem Relation Age of Onset   Breast cancer Mother    Stroke Father    Colon cancer Brother    Stroke Brother     Social History    Socioeconomic History   Marital status: Widowed    Spouse name: [name]   Number of children: 3   Years of education: B.D.   Highest education level: Not on file  Occupational History   Occupation: Land    Comment: Retired  Tobacco Use   Smoking status: Never   Smokeless tobacco: Never  Vaping Use   Vaping Use: Never used  Substance and Sexual Activity   Alcohol use: No   Drug use: No   Sexual activity: Not Currently  Other Topics Concern   Not on file  Social History Narrative   Lives alone, at [place].  Has some help with housecleaning but o/w he is independent.     Was married to first wife 69 years, then to second wife 12 years.  Widowed twice.        Retired Theme park manager, [employer]   One son was killed in [place] in 1984   One son and one daughter (she is an Therapist, sports) still alive in       Has living will   Daughter is health care POA   Has DNR    No feeding tube if cognitively unaware   Social Determinants of Radio broadcast assistant Strain: Not on file  Food Insecurity: Not on file  Transportation Needs: Not on file  Physical Activity: Not on file  Stress: Not on file  Social Connections: Not on file  Intimate Partner Violence: Not on file     Constitutional: Denies fever, malaise, fatigue, headache or abrupt weight changes.  HEENT: Denies eye pain, eye redness, ear pain, ringing in the ears, wax buildup, runny nose, nasal congestion, bloody nose, or sore throat. Respiratory: Denies difficulty breathing, shortness of breath, cough or sputum production.   Cardiovascular: Pt reports intermittent chest pain. Denies chest pain, chest tightness, palpitations or swelling in the hands or feet.  Gastrointestinal: Denies abdominal pain, bloating, constipation, diarrhea or blood in the stool.  GU: Denies urgency, frequency, pain with urination, burning sensation, blood in urine, odor or discharge. Musculoskeletal: Denies decrease in range of  motion, difficulty with gait, muscle pain or joint pain and swelling.  Skin: Denies redness, rashes, lesions or ulcercations.  Neurological: Denies dizziness, difficulty with memory, difficulty with speech or problems with balance and coordination.  Psych: Denies anxiety, depression, SI/HI.  No other specific complaints in a complete review of systems (except as listed in HPI above).  Objective:   Physical Exam   BP 120/74   Pulse 68   Temp 98.5 F (36.9 C)  Resp 20   Wt 206 lb 11.2 oz (93.8 kg)   SpO2 99%   BMI 35.48 kg/m   Wt Readings from Last 3 Encounters:  09/26/20 205 lb (93 kg)  08/10/20 192 lb 8 oz (87.3 kg)  07/27/20 199 lb 4 oz (90.4 kg)    General: Appears his stated age, well developed, well nourished in NAD. Skin: Warm, dry and intact.  Cardiovascular: Normal rate and rhythm. S1,S2 noted.  No murmur, rubs or gallops noted. Trace pitting BLE edema.  Pulmonary/Chest: Normal effort and positive vesicular breath sounds. No respiratory distress. No wheezes, rales or ronchi noted.  Abdomen: Soft and nontender. Normal bowel sounds.  Musculoskeletal:  No difficulty with gait.  Neurological: Alert and oriented. Psychiatric: Mood and affect normal. Behavior is normal. Judgment and thought content normal.    BMET    Component Value Date/Time   NA 132 (L) 09/26/2020 1634   K 4.4 09/26/2020 1634   CL 99 09/26/2020 1634   CO2 24 09/26/2020 1634   GLUCOSE 126 (H) 09/26/2020 1634   BUN 14 09/26/2020 1634   CREATININE 1.29 (H) 09/26/2020 1634   CALCIUM 8.8 (L) 09/26/2020 1634   GFRNONAA 53 (L) 09/26/2020 1634   GFRAA >60 04/01/2018 0520    Lipid Panel     Component Value Date/Time   CHOL 137 03/31/2018 0552   CHOL 138 12/16/2012 0837   TRIG 64 03/31/2018 0552   HDL 42 03/31/2018 0552   HDL 46 12/16/2012 0837   CHOLHDL 3.3 03/31/2018 0552   VLDL 13 03/31/2018 0552   LDLCALC 82 03/31/2018 0552   LDLCALC 75 12/16/2012 0837    CBC    Component Value  Date/Time   WBC 8.1 04/02/2018 0341   RBC 4.12 (L) 04/02/2018 0341   HGB 14.1 12/31/2018 0000   HCT 38.1 (L) 04/02/2018 0341   PLT 174 04/02/2018 0341   MCV 92.5 04/02/2018 0341   MCH 30.6 04/02/2018 0341   MCHC 33.1 04/02/2018 0341   RDW 12.6 04/02/2018 0341   LYMPHSABS 1.6 03/31/2018 0223   MONOABS 0.7 03/31/2018 0223   EOSABS 0.2 03/31/2018 0223   BASOSABS 0.1 03/31/2018 0223    Hgb A1C Lab Results  Component Value Date   HGBA1C 5.8 (H) 05/27/2013           Assessment & Plan:    Webb Silversmith, NP This visit occurred during the SARS-CoV-2 public health emergency.  Safety protocols were in place, including screening questions prior to the visit, additional usage of staff PPE, and extensive cleaning of exam room while observing appropriate contact time as indicated for disinfecting solutions.

## 2020-12-04 ENCOUNTER — Encounter: Payer: Self-pay | Admitting: Internal Medicine

## 2020-12-04 ENCOUNTER — Telehealth: Payer: Self-pay | Admitting: Nurse Practitioner

## 2020-12-04 DIAGNOSIS — N1831 Chronic kidney disease, stage 3a: Secondary | ICD-10-CM | POA: Insufficient documentation

## 2020-12-04 NOTE — Assessment & Plan Note (Signed)
Controlled with Amlodipine, Metoprolol and Furosemide Will monitor

## 2020-12-04 NOTE — Assessment & Plan Note (Signed)
Controlled with Atorvastatin, Metoprolol, Isosorbide, ASA and Plavix Encouraged him to consume a low fat diet Will have him follow up with cardiology given his ongoing angina

## 2020-12-04 NOTE — Assessment & Plan Note (Signed)
Will check BMET in November

## 2020-12-04 NOTE — Assessment & Plan Note (Signed)
Appreciate ALF care

## 2020-12-04 NOTE — Assessment & Plan Note (Signed)
" >>  ASSESSMENT AND PLAN FOR CHRONIC DIASTOLIC HEART FAILURE (HCC) WRITTEN ON 12/04/2020  1:12 PM BY Kalik Hoare W, NP  Compensated Controlled with Amlodipine , Metoprolol  and Furosemide  Will monitor "

## 2020-12-04 NOTE — Telephone Encounter (Signed)
Pt c/o of Chest Pain: STAT if CP now or developed within 24 hours  1. Are you having CP right now? no  2. Are you experiencing any other symptoms (ex. SOB, nausea, vomiting, sweating)? No   3. How long have you been experiencing CP? Comes and goes unknown mentioned to NP  at pcp visit    4. Is your CP continuous or coming and going? intermittent  5. Have you taken Nitroglycerin? no ? Facility [facility] calling for an appt to eval intermittent chest pain  Scheduled with berge 10/27 at 330.

## 2020-12-04 NOTE — Telephone Encounter (Signed)
Spoke with Luellen Pucker at [facility] and she states patient had been doing fine but when he saw provider yesterday he reported intermittent chest pain. She states he has been very active and has never reported this prior to yesterday. Luellen Pucker states that he previously had indigestion and his pain was determined not to be cardiac issue. She reports that he has never mentioned chest pain to anyone so she is not sure if this is cardiac related. She printed out his vital signs and will make sure to send that with him to upcoming appointment. Advised that if chest pain should worsen to please have him call 911. She verbalized understanding with no further questions.

## 2020-12-04 NOTE — Assessment & Plan Note (Signed)
-   Continue Flomax 

## 2020-12-04 NOTE — Assessment & Plan Note (Signed)
Compensated Controlled with Amlodipine, Metoprolol and Furosemide Will monitor

## 2020-12-04 NOTE — Telephone Encounter (Signed)
Left voicemail message to call back  

## 2020-12-04 NOTE — Patient Instructions (Signed)
Angina Angina is discomfort or pain in the chest, neck, arm, jaw, or back. The discomfort is caused by a lack of blood in the middle layer of the heart wall. The middle layer of the heart wall is called the myocardium. What are the causes? This condition is caused by a buildup of fat and cholesterol, or plaque, in your arteries. This buildup narrows the arteries and makes it hard for blood to flow. What increases the risk? Main risks High levels of cholesterol in your blood. High blood pressure. Diabetes. Family history of heart disease. Not exercising or moving enough. Depression. Having had radiation treatment on the left side of your chest. Other risks Tobacco use. Too much body weight (obesity). A diet that is high in unhealthy fats (saturated fats). Stress. Using drugs, such as cocaine. Risks for women Being older than 55 years. Being in menopause. This is the time when a woman no longer has a menstrual period. What are the signs or symptoms? Symptoms in all people Chest pain, which may: Feel like a crushing or squeezing in the chest. Feel like a tightness, pressure, fullness, or heaviness in the chest. Last for more than a few minutes at a time. Stop and come back (recur) after a few minutes. Pain in the neck, arm, jaw, or back. Heartburn or upset stomach (indigestion) for no reason. Being short of breath. Feeling like you may vomit (nauseous). Sudden cold sweats. Symptoms in women and people with diabetes Tiredness. Worry and anxiety. Weakness. Dizziness or fainting. How is this treated? This condition may be treated with: Medicines. These can be given to prevent blood clots, stop a heart attack, lower blood pressure, or treat other risk factors. A procedure to widen a narrowed or blocked artery in the heart. Surgery to allow blood to go around a blocked artery. Follow these instructions at home: Medicines Take over-the-counter and prescription medicines only as  told by your doctor. Do not take these medicines unless your doctor says that you can: NSAIDs. These include: Ibuprofen. Naproxen. Vitamin supplements that have vitamin A, vitamin E, or both. Hormone therapy that contains estrogen with or without progestin. Eating and drinking  Eat a heart-healthy diet that includes: Lots of fresh fruits and vegetables. Whole grains. Low-fat (lean) protein. Low-fat dairy products. Follow instructions from your doctor about what you cannot eat or drink. Activity Follow an exercise program that your doctor tells you. Talk with your doctor about joining a program to make your heart strong again (cardiac rehab). When you feel tired, take a break. Plan breaks if you know you are going to feel tired. Lifestyle  Do not smoke or use any products that contain nicotine or tobacco. If you need help quitting, ask your doctor. If your doctor says you can drink alcohol: Limit how much you have to: 0-1 drink a day for women who are not pregnant. 0-2 drinks a day for men. Know how much alcohol is in your drink. In the [place] one drink equals one 12 oz bottle of beer (355 mL), one 5 oz glass of wine (148 mL), or one 1 oz glass of hard liquor (44 mL). General instructions Stay at a healthy weight. If told to lose weight, work with your doctor to do lose weight safely. Learn to manage stress. If you need help, ask your doctor. Keep your vaccines up to date. Get a flu shot every year. Talk with your doctor if you feel sad. Take a screening test to see if you are  at risk for depression. Work with your doctor to manage any other health problems that you have. These may include diabetes or high blood pressure. Keep all follow-up visits. Get help right away if: You have pain in your chest, neck, arm, jaw, or back, and the pain: Lasts more than a few minutes. Comes back. Does not get better after you take medicine under your tongue (sublingual nitroglycerin). Keeps  getting worse. Comes more often. You have any of these problems: Sweating a lot. Heartburn or upset stomach. Shortness of breath. Trouble breathing. Feeling like you may vomit. Vomiting. Feeling more tired than normal. Feeling nervous or worrying more than normal. Weakness. You are dizzy or light-headed all of a sudden. You faint. These symptoms may be an emergency. Get help right away. Call your local emergency services (911 in the [place] Do not wait to see if the symptoms will go away. Do not drive yourself to the hospital. Summary Angina is discomfort or pain in the chest, neck, arm, neck, or back. Symptoms include chest pain, heartburn or upset stomach, and shortness of breath. Women or people with diabetes may have other symptoms, such as feeling nervous, being worried, or being weak or tired. Take all medicines only as told by your doctor. You should eat a heart-healthy diet and follow an exercise program. This information is not intended to replace advice given to you by your health care provider. Make sure you discuss any questions you have with your health care provider. Document Revised: 07/23/2019 Document Reviewed: 07/23/2019 Elsevier Patient Education  [place].

## 2020-12-04 NOTE — Assessment & Plan Note (Signed)
Continue Pantoprazole.

## 2020-12-07 ENCOUNTER — Encounter: Payer: Self-pay | Admitting: Nurse Practitioner

## 2020-12-07 ENCOUNTER — Other Ambulatory Visit: Payer: Self-pay

## 2020-12-07 ENCOUNTER — Ambulatory Visit (INDEPENDENT_AMBULATORY_CARE_PROVIDER_SITE_OTHER): Payer: Medicare Other | Admitting: Nurse Practitioner

## 2020-12-07 VITALS — BP 124/70 | HR 69 | Ht 64.0 in | Wt 205.1 lb

## 2020-12-07 DIAGNOSIS — I208 Other forms of angina pectoris: Secondary | ICD-10-CM | POA: Diagnosis not present

## 2020-12-07 DIAGNOSIS — I25118 Atherosclerotic heart disease of native coronary artery with other forms of angina pectoris: Secondary | ICD-10-CM | POA: Diagnosis not present

## 2020-12-07 DIAGNOSIS — E785 Hyperlipidemia, unspecified: Secondary | ICD-10-CM | POA: Diagnosis not present

## 2020-12-07 DIAGNOSIS — I10 Essential (primary) hypertension: Secondary | ICD-10-CM

## 2020-12-07 DIAGNOSIS — I255 Ischemic cardiomyopathy: Secondary | ICD-10-CM

## 2020-12-07 DIAGNOSIS — I5032 Chronic diastolic (congestive) heart failure: Secondary | ICD-10-CM | POA: Diagnosis not present

## 2020-12-07 MED ORDER — ISOSORBIDE MONONITRATE ER 60 MG PO TB24
60.0000 mg | ORAL_TABLET | Freq: Every day | ORAL | 0 refills | Status: DC
Start: 2020-12-07 — End: 2020-12-07

## 2020-12-07 MED ORDER — ISOSORBIDE MONONITRATE ER 60 MG PO TB24: 60.0000 mg | ORAL_TABLET | Freq: Every day | ORAL | 0 refills | Status: DC

## 2020-12-07 NOTE — Progress Notes (Signed)
Office Visit    Patient Name: Arieon Corcoran Date of Encounter: 12/07/2020  Primary Care Provider:  Arvil Chaco, PA-C Primary Cardiologist:  Nelva Bush, MD  Chief Complaint    49 y /o ? w/ a h/o CAD status post remote CABG, hypertension, hyperlipidemia, and stroke, who presents for follow-up related to exertional upper chest pain.  Past Medical History    Past Medical History:  Diagnosis Date   Allergic rhinitis    Benign prostatic hypertrophy    CAD (coronary artery disease)    a. s/p CABG x 4 (1998; LIMA-LAD, SVG-D1-D2, SVG-rPL); b. 2000 & 2006 s/p PCI of the LCX/OM2; c. 03/2018 Cath: LM 25d, LAD 100ost/m, 46m/d, 1st septal fills via RPDA, D2 100, fills via collats from D3, LCX 90ost/m ISR w/ evidence of stent fracture, 27m, OM2 30 ISR, 80/80, RCA 70p, RPDA 60ost, 70, RPAV 100, VG->D1->D2 100p (culprit), LIMA->LAd nl, VG->RPL2 nl. EF 45%-->Med Rx.   Duodenal diverticulum 02/11/2001   GERD (gastroesophageal reflux disease)    H/O hiatal hernia 02/11/2001   Heart murmur    Hepatic lesion 02/11/2002   History of melanoma excision    FACE   Hypercholesteremia    Hypertension    Left carotid stenosis    > 50%  PER DUPLEX  03-28-2011   Mild mitral regurgitation    Perianal fistula    S/P CABG x 4    1988   S/P drug eluting coronary stent placement    POST CABG--  STENTING 2000  AND RESTENTING 2006 FOR IN-STENT STENOSIS   Stroke [facility])    Past Surgical History:  Procedure Laterality Date   CATARACT EXTRACTION W/ INTRAOCULAR LENS  IMPLANT, BILATERAL     CORONARY ANGIOPLASTY WITH STENT PLACEMENT  2000   PCI AND STENTING CIRCUMFLEX   CORONARY ANGIOPLASTY WITH STENT PLACEMENT  04-30-2004  DR Daneen Schick   RE-INSTENT STENOSIS/  DRUG-ELUTING STENT OF PROXIMAL AND MID CIRCUMFLEX/ PCI MID [place]ump STRUT/  WIDELY PATENT SAPHENOUS VEIN GRAFT AND  LIMA TO LAD GRAFT/ TOTAL OCCLUSION RIGHT CORONARY BEYOND THE POSTERIOR DESCENDING ARTERY BRANCH WITH 50%  OSTIAL NARROWING/ TOTAL OCCLUSION OF LAD AT THE OSTIUM OF THE LEFT MAIN   CORONARY ARTERY BYPASS GRAFT  1988   X5   EVALUATION UNDER ANESTHESIA WITH ANAL FISTULECTOMY N/A 07/22/2012   Procedure: EXAM UNDER ANESTHESIA WITH ANAL fistulotomy;  Surgeon: Leighton Ruff, MD;  Location: [place];  Service: General;  Laterality: N/A;   LEFT HEART CATH AND CORS/GRAFTS ANGIOGRAPHY N/A 03/31/2018   Procedure: LEFT HEART CATH AND CORS/GRAFTS ANGIOGRAPHY;  Surgeon: Nelva Bush, MD;  Location: [place] [facility];  Service: Cardiovascular;  Laterality: N/A;   SHOULDER ARTHROSCOPY WITH OPEN ROTATOR CUFF REPAIR Right 07-10-1999   TONSILLECTOMY AND ADENOIDECTOMY  1944   TRANSTHORACIC ECHOCARDIOGRAM  03-01-2011  DR Daneen Schick   NORMAL LVF AND LV SIZE/ MILD LEFT ATRIAL ENLARGEMENT/ GRADE II DIASTOLIC DYSFUNCTION WITH ELEVATED LEFT ATRIAL PRESSURE/ MILD TO MODERATE MR/ MILD TR   TRANSURETHRAL RESECTION OF PROSTATE  04/09/2011   Procedure: TRANSURETHRAL RESECTION OF THE PROSTATE WITH GYRUS INSTRUMENTS;  Surgeon: Malka So, MD;  Location: [facility];  Service: Urology;  Laterality: N/A;    Allergies  Allergies  Allergen Reactions   Tape Rash    ONLY USE PAPER TAPE ONLY USE PAPER TAPE   Iodine Rash    Rash when applied to skin   Niacin And Related Hives and Other (See Comments)    FLUSHING  itching    History of Present Illness    87 year old male with the above complex past medical history including CAD status post CABG x4 in 1998 with subsequent stenting of the circumflex and OM 2 in 2000 and 2006.  Other history includes hypertension, hyperlipidemia, and stroke.  In February 2020, he was admitted with chest pain underwent diagnostic catheterization revealing 2 of 4 patent grafts, with new finding of occlusion of the sequential vein graft to the diagonal branches.  He also had in-stent restenosis within the left circumflex, and moderate disease in the native LAD and RCA.  Medical therapy  was recommended and he has been managed with aspirin, statin, Plavix, beta-blocker, calcium channel blocker, and nitrate therapy.  Echo during admission showed an EF of 55 to 60% with mild anterolateral hypokinesis, impaired relaxation, and mild to moderate TR.  Mr. Delaughter has done reasonably well over the years.  He lives at an assisted living facility and walks a mile a day.  He was last seen in clinic in August, at which time he had increasing lower extremity edema and weight gain and was advised to take Lasix 20 mg daily with an additional dose as needed.  Labs at that time showed mild elevation creatinine to 1.29.  His weight has been hovering between 195 and 201 pounds on his home scale.  He does not routinely take an additional dose of Lasix.  About 2 weeks ago, he was walking his usual mile towards the end of his walk noted discomfort under his clavicles bilaterally.  He had no associated symptoms.  He was seen by the nurse practitioner onsite after symptoms had resolved and advised to follow-up with cardiology.  He has been walking 6 days a week since then without any recurrence of symptoms.  His daughter is present with him today and notes that he breathes heavily when he is walking, though patient denies any frank dyspnea.  His daughter is also concerned about his lower extremity swelling which has persisted for several months.  He has 2+ edema on exam today.  He denies palpitations, PND, orthopnea, dizziness, syncope, or early satiety.  Home Medications    Current Outpatient Medications  Medication Sig Dispense Refill   amLODipine (NORVASC) 5 MG tablet Take 1 tablet (5 mg total) by mouth daily. 90 tablet 3   aspirin EC 81 MG tablet Take 81 mg by mouth daily. Swallow whole.     atorvastatin (LIPITOR) 80 MG tablet TAKE 1 TABLET BY MOUTH EVERYDAY AT BEDTIME 90 tablet 2   clopidogrel (PLAVIX) 75 MG tablet TAKE 1 TABLET BY MOUTH EVERY DAY 90 tablet 3   cyanocobalamin (,VITAMIN B-12,) 1000 MCG/ML  injection Inject 1,000 mcg into the muscle every 30 (thirty) days.     furosemide (LASIX) 20 MG tablet Take 1 tablet (20 mg total) by mouth daily. 30 tablet 5   metoprolol succinate (TOPROL-XL) 25 MG 24 hr tablet TAKE 1 TABLET (25 MG TOTAL) BY MOUTH DAILY. TAKE WITH OR IMMEDIATELY FOLLOWING A MEAL. 90 tablet 3   pantoprazole (PROTONIX) 40 MG tablet TAKE 1 TABLET BY MOUTH EVERY DAY 90 tablet 3   polyethylene glycol (MIRALAX / GLYCOLAX) packet Take 17 g by mouth daily.     tamsulosin (FLOMAX) 0.4 MG CAPS capsule Take 0.4 mg by mouth daily.     isosorbide mononitrate (IMDUR) 60 MG 24 hr tablet Take 1 tablet (60 mg total) by mouth daily. 90 tablet 0   ketoconazole (NIZORAL) 2 % cream Apply 1  application topically 2 (two) times daily. (Patient not taking: No sig reported) 60 g 1   nitroGLYCERIN (NITROSTAT) 0.4 MG SL tablet PLACE 1 TABLET (0.4 MG TOTAL) UNDER THE TONGUE EVERY 5 (FIVE) MINUTES AS NEEDED FOR CHEST PAIN. (Patient not taking: No sig reported) 25 tablet 5   No current facility-administered medications for this visit.     Review of Systems    1 episode of upper chest/subclavicular discomfort that occurred while walking about 2 weeks ago.  Bilateral lower extremity edema.  No palpitations, PND, orthopnea, dizziness, syncope, or early satiety.  All other systems reviewed and are otherwise negative except as noted above.  Physical Exam    VS:  BP 124/70 (BP Location: Left Arm, Patient Position: Sitting, Cuff Size: Normal)   Pulse 69   Ht 5\' 4"  (1.626 m)   Wt 205 lb 2 oz (93 kg)   SpO2 98%   BMI 35.21 kg/m  , BMI Body mass index is 35.21 kg/m.     GEN: Well nourished, well developed, in no acute distress. HEENT: normal. Neck: Supple, no JVD, carotid bruits, or masses. Cardiac: RRR, no murmurs, rubs, or gallops. No clubbing, cyanosis, 2+ bilateral lower extremity edema to the calves.  Radials 2+/PT 1+ and equal bilaterally.  Respiratory:  Respirations regular and unlabored, clear to  auscultation bilaterally. GI: Soft, nontender, nondistended, BS + x 4. MS: no deformity or atrophy. Skin: warm and dry, no rash. Neuro:  Strength and sensation are intact. Psych: Normal affect.  Accessory Clinical Findings    ECG personally reviewed by me today -sinus arrhythmia, 69, no acute ST or T changes.  Lab Results  Component Value Date   WBC 8.1 04/02/2018   HGB 14.1 12/31/2018   HCT 38.1 (L) 04/02/2018   MCV 92.5 04/02/2018   PLT 174 04/02/2018   Lab Results  Component Value Date   CREATININE 1.29 (H) 09/26/2020   BUN 14 09/26/2020   NA 132 (L) 09/26/2020   K 4.4 09/26/2020   CL 99 09/26/2020   CO2 24 09/26/2020   Lab Results  Component Value Date   ALT 17 09/26/2020   AST 23 09/26/2020   ALKPHOS 79 09/26/2020   BILITOT 0.8 09/26/2020   Lab Results  Component Value Date   CHOL 137 03/31/2018   HDL 42 03/31/2018   LDLCALC 82 03/31/2018   TRIG 64 03/31/2018   CHOLHDL 3.3 03/31/2018    Lab Results  Component Value Date   HGBA1C 5.8 (H) 05/27/2013    Assessment & Plan    1.  CAD/stable angina: Status post CABG times 05/30/1996 with subsequent circumflex/OM stenting and diagnostic catheterization February 2022 revealing severe multivessel disease and occluded sequential vein graft to the diagonal branches.  Medical therapy was advised and he has generally done well.  He had an episode of exertional upper chest/subclavicular discomfort about 2 weeks ago, which resolved with rest.  He has not had any recurrent symptoms despite walking a mile a day.  Given known disease, I am going to increase his isosorbide to 60 mg daily.  He otherwise remains on aspirin, statin, Plavix, beta-blocker, and calcium channel blocker therapy.  2.  HFpEF: Dating back to at least over the summer, patient has been experiencing lower extremity swelling.  His weight has been trending between 195 and 21 pounds on his home scale.  He is 205 on our scale today which is consistent with his  August weight from our office as well.  His daughter is  concerned about lower extremity swelling, which is present and 2+ bilaterally today.  Patient takes Lasix 20 mg daily and does not routinely take an additional dose despite weight changes.  We discussed that his dry weight is likely 195 pounds and therefore, he should take an additional Lasix tomorrow morning but instead of increasing lasix on a daily basis going forward, I'd prefer that he use compression socks, to see if we can limit edema that way.  His heart rate and blood pressure are stable.  Otherwise continue current medications.  3.  Essential hypertension: Stable.  4.  Hyperlipidemia: He has not had lipids in our system since February 2020, at which time his LDL was 82.  This is apparently being followed at [facility].  LFTs were normal in August.  He remains on statin therapy.  5.  Disposition: Follow-up in 1 month or sooner if necessary.   Murray Hodgkins, NP 12/07/2020, 5:04 PM

## 2020-12-07 NOTE — Patient Instructions (Signed)
Medication Instructions:   Your physician has recommended you make the following change in your medication:    1. INCREASE Imdur - Take one tablet (60mg ) by mouth daily.   *If you need a refill on your cardiac medications before your next appointment, please call your pharmacy*   Lab Work:  None Ordered  If you have labs (blood work) drawn today and your tests are completely normal, you will receive your results only by: [place] (if you have MyChart) OR A paper copy in the mail If you have any lab test that is abnormal or we need to change your treatment, we will call you to review the results.   Testing/Procedures:  None Ordered   Follow-Up: At [facility], you and your health needs are our priority.  As part of our continuing mission to provide you with exceptional heart care, we have created designated Provider Care Teams.  These Care Teams include your primary Cardiologist (physician) and Advanced Practice Providers (APPs -  Physician Assistants and Nurse Practitioners) who all work together to provide you with the care you need, when you need it.  We recommend signing up for the patient portal called "MyChart".  Sign up information is provided on this After Visit Summary.  MyChart is used to connect with patients for Virtual Visits (Telemedicine).  Patients are able to view lab/test results, encounter notes, upcoming appointments, etc.  Non-urgent messages can be sent to your provider as well.   To learn more about what you can do with MyChart, go to [email or web address].    Your next appointment:   1 month(s)  The format for your next appointment:   In Person  Provider:   You may see Nelva Bush, MD or Murray Hodgkins, NP    Other Instructions  Weight Goal: 195 lb Recommend you purchase compression socks

## 2020-12-18 ENCOUNTER — Emergency Department
Admission: EM | Admit: 2020-12-18 | Discharge: 2020-12-18 | Disposition: A | Discharge status: Home / Self Care | Payer: Medicare Other | Attending: Emergency Medicine | Admitting: Emergency Medicine

## 2020-12-18 ENCOUNTER — Other Ambulatory Visit: Payer: Self-pay

## 2020-12-18 ENCOUNTER — Emergency Department: Payer: Medicare Other

## 2020-12-18 DIAGNOSIS — I5032 Chronic diastolic (congestive) heart failure: Secondary | ICD-10-CM | POA: Diagnosis not present

## 2020-12-18 DIAGNOSIS — Z955 Presence of coronary angioplasty implant and graft: Secondary | ICD-10-CM | POA: Diagnosis not present

## 2020-12-18 DIAGNOSIS — M7989 Other specified soft tissue disorders: Secondary | ICD-10-CM | POA: Diagnosis not present

## 2020-12-18 DIAGNOSIS — I130 Hypertensive heart and chronic kidney disease with heart failure and stage 1 through stage 4 chronic kidney disease, or unspecified chronic kidney disease: Secondary | ICD-10-CM | POA: Insufficient documentation

## 2020-12-18 DIAGNOSIS — Z20822 Contact with and (suspected) exposure to covid-19: Secondary | ICD-10-CM | POA: Diagnosis not present

## 2020-12-18 DIAGNOSIS — R0789 Other chest pain: Secondary | ICD-10-CM | POA: Diagnosis not present

## 2020-12-18 DIAGNOSIS — I25119 Atherosclerotic heart disease of native coronary artery with unspecified angina pectoris: Secondary | ICD-10-CM | POA: Insufficient documentation

## 2020-12-18 DIAGNOSIS — R600 Localized edema: Secondary | ICD-10-CM | POA: Insufficient documentation

## 2020-12-18 DIAGNOSIS — Z79899 Other long term (current) drug therapy: Secondary | ICD-10-CM | POA: Insufficient documentation

## 2020-12-18 DIAGNOSIS — R079 Chest pain, unspecified: Secondary | ICD-10-CM | POA: Diagnosis not present

## 2020-12-18 DIAGNOSIS — N1831 Chronic kidney disease, stage 3a: Secondary | ICD-10-CM | POA: Diagnosis not present

## 2020-12-18 DIAGNOSIS — Z7982 Long term (current) use of aspirin: Secondary | ICD-10-CM | POA: Insufficient documentation

## 2020-12-18 DIAGNOSIS — I10 Essential (primary) hypertension: Secondary | ICD-10-CM | POA: Diagnosis not present

## 2020-12-18 LAB — RESPIRATORY PANEL BY PCR

## 2020-12-18 LAB — LACTIC ACID, PLASMA: Lactic Acid, Venous: 1.6 mmol/L (ref 0.5–1.9)

## 2020-12-18 LAB — BASIC METABOLIC PANEL
Anion gap: 12 (ref 5–15)
BUN: 12 mg/dL (ref 8–23)
CO2: 26 mmol/L (ref 22–32)
Calcium: 9.2 mg/dL (ref 8.9–10.3)
Chloride: 106 mmol/L (ref 98–111)
Creatinine, Ser: 1.03 mg/dL (ref 0.61–1.24)
GFR, Estimated: >60 mL/min (ref >60)
Glucose, Bld: 106 mg/dL — ABNORMAL HIGH (ref 70–99)
Potassium: 4 mmol/L (ref 3.5–5.1)
Sodium: 144 mmol/L (ref 135–145)

## 2020-12-18 LAB — TROPONIN I (HIGH SENSITIVITY)
Troponin I (High Sensitivity): 7 ng/L (ref <18)
Troponin I (High Sensitivity): 6 ng/L (ref <18)

## 2020-12-18 LAB — CBC
HCT: 37.3 % — ABNORMAL LOW (ref 39.0–52.0)
Hemoglobin: 12.5 g/dL — ABNORMAL LOW (ref 13.0–17.0)
MCH: 30.5 pg (ref 26.0–34.0)
MCHC: 33.5 g/dL (ref 30.0–36.0)
MCV: 91 fL (ref 80.0–100.0)
Platelets: 222 10*3/uL (ref 150–400)
RBC: 4.1 MIL/uL — ABNORMAL LOW (ref 4.22–5.81)
RDW: 12.9 % (ref 11.5–15.5)
WBC: 6.8 10*3/uL (ref 4.0–10.5)
nRBC: 0 % (ref 0.0–0.2)

## 2020-12-18 LAB — RESP PANEL BY RT-PCR (FLU A&B, COVID) ARPGX2
Influenza A by PCR: NEGATIVE
Influenza B by PCR: NEGATIVE
SARS Coronavirus 2 by RT PCR: NEGATIVE

## 2020-12-18 LAB — PROCALCITONIN: Procalcitonin: <0.1 ng/mL

## 2020-12-18 MED ORDER — IOHEXOL 350 MG/ML SOLN
75.0000 mL | Freq: Once | INTRAVENOUS | Status: AC | PRN
  Administered 2020-12-18: 75 mL via INTRAVENOUS

## 2020-12-18 NOTE — ED Notes (Signed)
Pt taken to CT at this time.

## 2020-12-18 NOTE — ED Notes (Signed)
Pt given orange juice and [place] sandwich. Pt connected to monitoring equipment. Call bell/side table within reach. Bed in lowest position, side rails up.

## 2020-12-18 NOTE — ED Triage Notes (Signed)
Pt from independent living from [facility]. Pt woke up at 0130 with left sided chest pain,. Denies radiation. Pt took 2 nitro and pain was gone per pt. Pt with hx of NSTEMI. Pt was given 324 baby asa by EMS> Pt is on plavix.

## 2020-12-18 NOTE — ED Provider Notes (Addendum)
[facility] Emergency Department Provider Note  ____________________________________________   Event Date/Time   First MD Initiated Contact with Patient 12/18/20 0530     (approximate)  I have reviewed the triage vital signs and the nursing notes.   HISTORY  Chief Complaint Chest Pain    HPI Jason Velazquez is a 87 y.o. male with history of CAD status post CABG, hypertension, hyperlipidemia, previous DVT, chronic kidney disease who presents to the emergency department with complaints of central throbbing chest pain that woke him from sleep.  No radiation of pain.  No aggravating or alleviating factors.  Denies any tightness or pressure.  No associated shortness of breath, nausea, vomiting, diaphoresis or dizziness.  He is not sure if this feels like his previous anginal equivalent as he states it has been so long since he has had chest pain.  He states symptoms only lasted about 2 to 3 minutes.  He called the staff at [facility] where he lives and they called EMS.  Reports by the time EMS got there he was completely asymptomatic and has been asymptomatic since he has arrived in the ED.  States he has had lower extremity swelling bilaterally for 4 weeks.  No calf tenderness.  He is not on anticoagulation.  He denies any fevers or cough.  No sick contacts.  Currently has no pain.    Echo 03/31/2018: 1. The left ventricle has normal systolic function, with an ejection  fraction of 55-60%. The cavity size was normal. There is moderately  increased left ventricular wall thickness. Left ventricular diastolic  Doppler parameters are consistent with  impaired relaxation Indeterminent filling pressures.   2. The right ventricle has normal systolic function. The cavity was  normal. There is no increase in right ventricular wall thickness.   3. The mitral valve is degenerative. Moderate thickening of the mitral  valve leaflet. Mild calcification of the mitral valve leaflet.    4. The tricuspid valve is normal in structure. Tricuspid valve  regurgitation is mild-moderate.   5. The aortic valve is tricuspid Mild thickening of the aortic valve Mild  calcification of the aortic valve.   6. Mild hypokinesis of the left ventricular anterolateral wall.   7. The interatrial septum was not well visualized.   LHC 03/31/2018: Conclusions: Severe three-vessel coronary artery disease including chronic total occlusion of the ostial and mid LAD, in-stent restenosis of overlapping proximal LCx stents complicated by stent fracture, sequential 80% OM2 stenoses, 80% jailed AV groove LCx stenosis, 70% proximal RCA lesion, sequential 60% and 70% ostial and distal RPDA stenoses and chronic total occlusion of the rPL. Widely patent LIMA to LAD and SVG to rPL. Occlusion of sequential SVG to diagonal branches.  I suspect this is the patient's culprit lesion with acute occlusion on chronic subtotal occlusion, given that collaterals have already developed. Mildly to moderately reduced left ventricular systolic function (EF 36%) with mid anterior akinesis. Mildly reduced left ventricular filling pressure.   Recommendations: Given diffuse disease and likely acute total occlusion superimposed on subtotal occlusion of SVG to diagonal branches in a graft that is more than 85 years old, I favor medical therapy. Dual antiplatelet therapy with aspirin and clopidogrel for at least 12 months. Continue IV heparin for 48 hours. Obtain echocardiogram. Transition atenolol to metoprolol succinate 25 mg daily. If patient has recurrent chest pain, consider addition of long-acting nitrate.  If refractory symptoms occur, one would need to consider PCI to LAD, LCx, and or RCA.  Past Medical History:  Diagnosis Date   Allergic rhinitis    Benign prostatic hypertrophy    CAD (coronary artery disease)    a. s/p CABG x 4 (1998; LIMA-LAD, SVG-D1-D2, SVG-rPL); b. 2000 & 2006 s/p PCI of the LCX/OM2; c. 03/2018  Cath: LM 25d, LAD 100ost/m, 25m/d, 1st septal fills via RPDA, D2 100, fills via collats from D3, LCX 90ost/m ISR w/ evidence of stent fracture, 45m, OM2 30 ISR, 80/80, RCA 70p, RPDA 60ost, 70, RPAV 100, VG->D1->D2 100p (culprit), LIMA->LAd nl, VG->RPL2 nl. EF 45%-->Med Rx.   Duodenal diverticulum 02/11/2001   GERD (gastroesophageal reflux disease)    H/O hiatal hernia 02/11/2001   Heart murmur    Hepatic lesion 02/11/2002   History of melanoma excision    FACE   Hypercholesteremia    Hypertension    Left carotid stenosis    > 50%  PER DUPLEX  03-28-2011   Mild mitral regurgitation    Perianal fistula    S/P CABG x 4    1988   S/P drug eluting coronary stent placement    POST CABG--  STENTING 2000  AND RESTENTING 2006 FOR IN-STENT STENOSIS   Stroke [facility])     Patient Active Problem List   Diagnosis Date Noted   Stage 3a chronic kidney disease ([place]) 12/04/2020   Chronic diastolic heart failure ([place]) 08/10/2020   Ischemic cardiomyopathy 06/23/2020   GERD (gastroesophageal reflux disease) 02/25/2018   MCI (mild cognitive impairment) 08/26/2017   Essential hypertension 05/26/2013   Hyperlipidemia LDL goal <70 05/26/2013   Atherosclerotic heart disease of native coronary artery with angina pectoris ([place]) 05/26/2013   BPH (benign prostatic hyperplasia) 04/09/2011    Past Surgical History:  Procedure Laterality Date   CATARACT EXTRACTION W/ INTRAOCULAR LENS  IMPLANT, BILATERAL     CORONARY ANGIOPLASTY WITH STENT PLACEMENT  2000   PCI AND STENTING CIRCUMFLEX   CORONARY ANGIOPLASTY WITH STENT PLACEMENT  04-30-2004  DR Daneen Schick   RE-INSTENT STENOSIS/  DRUG-ELUTING STENT OF PROXIMAL AND MID CIRCUMFLEX/ PCI MID CIRCUMFLEX THROUGH STENT STRUT/  WIDELY PATENT SAPHENOUS VEIN GRAFT AND  LIMA TO LAD GRAFT/ TOTAL OCCLUSION RIGHT CORONARY BEYOND THE POSTERIOR DESCENDING ARTERY BRANCH WITH 50% OSTIAL NARROWING/ TOTAL OCCLUSION OF LAD AT THE OSTIUM OF THE LEFT MAIN   CORONARY ARTERY BYPASS GRAFT   1988   X5   EVALUATION UNDER ANESTHESIA WITH ANAL FISTULECTOMY N/A 07/22/2012   Procedure: EXAM UNDER ANESTHESIA WITH ANAL fistulotomy;  Surgeon: Leighton Ruff, MD;  Location: [place];  Service: General;  Laterality: N/A;   LEFT HEART CATH AND CORS/GRAFTS ANGIOGRAPHY N/A 03/31/2018   Procedure: LEFT HEART CATH AND CORS/GRAFTS ANGIOGRAPHY;  Surgeon: Nelva Bush, MD;  Location: [place] [facility];  Service: Cardiovascular;  Laterality: N/A;   SHOULDER ARTHROSCOPY WITH OPEN ROTATOR CUFF REPAIR Right 07-10-1999   TONSILLECTOMY AND ADENOIDECTOMY  1944   TRANSTHORACIC ECHOCARDIOGRAM  03-01-2011  DR Daneen Schick   NORMAL LVF AND LV SIZE/ MILD LEFT ATRIAL ENLARGEMENT/ GRADE II DIASTOLIC DYSFUNCTION WITH ELEVATED LEFT ATRIAL PRESSURE/ MILD TO MODERATE MR/ MILD TR   TRANSURETHRAL RESECTION OF PROSTATE  04/09/2011   Procedure: TRANSURETHRAL RESECTION OF THE PROSTATE WITH GYRUS INSTRUMENTS;  Surgeon: Malka So, MD;  Location: [facility];  Service: Urology;  Laterality: N/A;    Prior to Admission medications   Medication Sig Start Date End Date Taking? Authorizing Provider  amLODipine (NORVASC) 5 MG tablet Take 1 tablet (5 mg total) by mouth daily. 06/08/20  Yes  Jearld Fenton, NP  atorvastatin (LIPITOR) 80 MG tablet TAKE 1 TABLET BY MOUTH EVERYDAY AT BEDTIME 04/24/20  Yes Venia Carbon, MD  clopidogrel (PLAVIX) 75 MG tablet TAKE 1 TABLET BY MOUTH EVERY DAY 03/29/19  Yes Tonia Ghent, MD  furosemide (LASIX) 20 MG tablet Take 1 tablet (20 mg total) by mouth daily. 09/26/20  Yes Visser, Jacquelyn D, PA-C  isosorbide mononitrate (IMDUR) 60 MG 24 hr tablet Take 1 tablet (60 mg total) by mouth daily. 12/07/20  Yes Theora Gianotti, NP  metoprolol succinate (TOPROL-XL) 25 MG 24 hr tablet TAKE 1 TABLET (25 MG TOTAL) BY MOUTH DAILY. TAKE WITH OR IMMEDIATELY FOLLOWING A MEAL. 07/22/19  Yes Viviana Simpler I, MD  pantoprazole (PROTONIX) 40 MG tablet TAKE 1 TABLET BY MOUTH EVERY DAY  12/06/19  Yes Venia Carbon, MD  polyethylene glycol (MIRALAX / GLYCOLAX) packet Take 17 g by mouth daily.   Yes [provider]  tamsulosin (FLOMAX) 0.4 MG CAPS capsule Take 0.4 mg by mouth daily.   Yes [provider]  aspirin EC 81 MG tablet Take 81 mg by mouth daily. Swallow whole. Patient not taking: Reported on 12/18/2020    [provider]  cyanocobalamin (,VITAMIN B-12,) 1000 MCG/ML injection Inject 1,000 mcg into the muscle every 30 (thirty) days. Patient not taking: Reported on 12/18/2020    [provider]  ketoconazole (NIZORAL) 2 % cream Apply 1 application topically 2 (two) times daily. Patient not taking: Reported on 12/18/2020 04/05/16   Viviana Simpler I, MD  nitroGLYCERIN (NITROSTAT) 0.4 MG SL tablet PLACE 1 TABLET (0.4 MG TOTAL) UNDER THE TONGUE EVERY 5 (FIVE) MINUTES AS NEEDED FOR CHEST PAIN. Patient not taking: No sig reported 03/29/19   Tonia Ghent, MD    Allergies Tape, Iodine, and Niacin and related  Family History  Problem Relation Age of Onset   Breast cancer Mother    Stroke Father    Colon cancer Brother    Stroke Brother     Social History Social History   Tobacco Use   Smoking status: Never   Smokeless tobacco: Never  Vaping Use   Vaping Use: Never used  Substance Use Topics   Alcohol use: No   Drug use: No    Review of Systems Constitutional: No fever. Eyes: No visual changes. ENT: No sore throat. Cardiovascular: + chest pain. Respiratory: Denies shortness of breath. Gastrointestinal: No nausea, vomiting, diarrhea. Genitourinary: Negative for dysuria. Musculoskeletal: Negative for back pain. Skin: Negative for rash. Neurological: Negative for focal weakness or numbness.  ____________________________________________   PHYSICAL EXAM:  VITAL SIGNS: ED Triage Vitals  Enc Vitals Group     BP 12/18/20 0241 (!) 145/75     Pulse Rate 12/18/20 0241 65     Resp 12/18/20 0241 18     Temp 12/18/20  0241 98 F (36.7 C)     Temp Source 12/18/20 0241 Oral     SpO2 12/18/20 0241 98 %     Weight 12/18/20 0237 188 lb (85.3 kg)     Height 12/18/20 0237 5\' 4"  (1.626 m)     Head Circumference --      Peak Flow --      Pain Score 12/18/20 0237 0     Pain Loc --      Pain Edu? --      Excl. in [place]? --    CONSTITUTIONAL: Alert and oriented and responds appropriately to questions. Well-appearing; well-nourished, elderly, no distress HEAD:  Normocephalic EYES: Conjunctivae clear, pupils appear equal, EOM appear intact ENT: normal nose; moist mucous membranes NECK: Supple, normal ROM CARD: RRR; S1 and S2 appreciated; no murmurs, no clicks, no rubs, no gallops RESP: Normal chest excursion without splinting or tachypnea; breath sounds clear and equal bilaterally; no wheezes, no rhonchi, no rales, no hypoxia or respiratory distress, speaking full sentences ABD/GI: Normal bowel sounds; non-distended; soft, non-tender, no rebound, no guarding, no peritoneal signs, no hepatosplenomegaly BACK: The back appears normal EXT: Normal ROM in all joints; no deformity noted, +1 edema in bilateral lower extremities with no asymmetric swelling, no calf tenderness, extremities warm and well-perfused SKIN: Normal color for age and race; warm; no rash on exposed skin NEURO: Moves all extremities equally PSYCH: The patient's mood and manner are appropriate.  ____________________________________________   LABS (all labs ordered are listed, but only abnormal results are displayed)  Labs Reviewed  BASIC METABOLIC PANEL - Abnormal; Notable for the following components:      Result Value   Glucose, Bld 106 (*)    All other components within normal limits  CBC - Abnormal; Notable for the following components:   RBC 4.10 (*)    Hemoglobin 12.5 (*)    HCT 37.3 (*)    All other components within normal limits  RESP PANEL BY RT-PCR (FLU A&B, COVID) ARPGX2  RESPIRATORY PANEL BY PCR  CULTURE, BLOOD (ROUTINE X 2)   CULTURE, BLOOD (ROUTINE X 2)  PROCALCITONIN  LACTIC ACID, PLASMA  TROPONIN I (HIGH SENSITIVITY)  TROPONIN I (HIGH SENSITIVITY)   ____________________________________________  EKG   EKG Interpretation  Date/Time:  Monday December 18 2020 02:36:42 EST Ventricular Rate:  69 PR Interval:  206 QRS Duration: 74 QT Interval:  408 QTC Calculation: 437 R Axis:   65 Text Interpretation: Normal sinus rhythm Normal ECG Confirmed by Pryor Curia [phone]) on 12/18/2020 7:12:34 AM        ____________________________________________  RADIOLOGY Jessie Foot Annamarie Yamaguchi, personally viewed and evaluated these images (plain radiographs) as part of my medical decision making, as well as reviewing the written report by the radiologist.  ED MD interpretation: Chest x-ray shows possible left basilar opacities.  Official radiology report(s): DG Chest 2 View  Result Date: 12/18/2020 CLINICAL DATA:  Chest pain EXAM: CHEST - 2 VIEW COMPARISON:  03/31/2018 FINDINGS: Left basilar opacity, best visualized on the lateral projection. Cardiomediastinal contours are normal. No pleural effusion or pneumothorax. Remote median sternotomy IMPRESSION: Left basilar opacities may indicate developing infection. Electronically Signed   By: Ulyses Jarred M.D.   On: 12/18/2020 03:05   CT Angio Chest PE W and/or Wo Contrast  Result Date: 12/18/2020 CLINICAL DATA:  87 year old male with central chest pain which woke him from sleep. EXAM: CT ANGIOGRAPHY CHEST WITH CONTRAST TECHNIQUE: Multidetector CT imaging of the chest was performed using the standard protocol during bolus administration of intravenous contrast. Multiplanar CT image reconstructions and MIPs were obtained to evaluate the vascular anatomy. CONTRAST:  86mL OMNIPAQUE IOHEXOL 350 MG/ML SOLN COMPARISON:  Chest radiographs 0240 hours today. FINDINGS: Cardiovascular: Good contrast bolus timing in the pulmonary arterial tree. No focal filling defect identified in the  pulmonary arteries to suggest acute pulmonary embolism. Calcified aortic atherosclerosis. Prior CABG. Borderline to mild cardiomegaly. No pericardial effusion. Mediastinum/Nodes: No mediastinal lymphadenopathy. Small to moderate gastric hiatal hernia. Lungs/Pleura: Fat containing right Bochdalek's hernia in addition to hiatal hernia. Major airways are patent. Mild dependent pulmonary atelectasis. No pleural effusion or other acute pulmonary opacity. Upper Abdomen: Negative visible  liver, gallbladder, spleen, pancreas, adrenal glands, kidneys and bowel loops in the upper abdomen. Musculoskeletal: Prior sternotomy. No acute osseous abnormality identified. Review of the MIP images confirms the above findings. IMPRESSION: 1. Negative for acute pulmonary embolus. 2. No acute finding in the chest. Prior CABG. Aortic Atherosclerosis (ICD10-I70.0). Borderline cardiomegaly. Small to moderate gastric hiatal hernia. Right Bochdalek hernia. Electronically Signed   By: Genevie Ann M.D.   On: 12/18/2020 07:37    ____________________________________________   PROCEDURES  Procedure(s) performed (including Critical Care):  Procedures    ____________________________________________   INITIAL IMPRESSION / ASSESSMENT AND PLAN / ED COURSE  As part of my medical decision making, I reviewed the following data within the [place] notes reviewed and incorporated, Labs reviewed , EKG interpreted , Old EKG reviewed, Old chart reviewed, Radiograph reviewed , and Notes from prior ED visits         Patient here with complaints of throbbing central chest pain that woke him from sleep but has now resolved.  EKG is nonischemic.  Troponin x2 obtained from triage are unremarkable.  Symptoms seem atypical for ACS.  Does have history of diffuse CAD with last cath being in 2020.  Currently chest pain-free.  Chest x-ray concerning for possible left lower lobe infiltrate but he denies any infectious symptoms.   He does have previous history of DVT.  Discussed concern for possible PE, pulmonary infarct.  We will proceed with CTA of the chest for further evaluation.  He does have recent lower extremity swelling as well but otherwise does not appear volume overloaded.  His lungs are clear to auscultation and he has no JVD.  His BNP is normal.  ED PROGRESS  Patient's lactic is normal.  Procalcitonin is normal.  I suspect that this is much less likely bacterial pneumonia.  COVID and flu swabs negative.  CTA of the chest shows no PE or other acute abnormality.  Specifically no pneumonia.  Patient is still asymptomatic and hemodynamically stable.  I feel he is safe for discharge home with close outpatient follow-up with his cardiologist and he is comfortable with this plan and would prefer discharge over admission for observation.  His symptoms seem somewhat atypical for ACS and he has had 2 normal high-sensitivity troponins here.  We discussed at length return precautions.  At this time, I do not feel there is any life-threatening condition present. I have reviewed, interpreted and discussed all results (EKG, imaging, lab, urine as appropriate) and exam findings with patient/family. I have reviewed nursing notes and appropriate previous records.  I feel the patient is safe to be discharged home without further emergent workup and can continue workup as an outpatient as needed. Discussed usual and customary return precautions. Patient/family verbalize understanding and are comfortable with this plan.  Outpatient follow-up has been provided as needed. All questions have been answered.  ____________________________________________   FINAL CLINICAL IMPRESSION(S) / ED DIAGNOSES  Final diagnoses:  Nonspecific chest pain     ED Discharge Orders     None       *Please note:  Kristin Lamagna was evaluated in Emergency Department on 12/18/2020 for the symptoms described in the history of present illness. He was  evaluated in the context of the global COVID-19 pandemic, which necessitated consideration that the patient might be at risk for infection with the SARS-CoV-2 virus that causes COVID-19. Institutional protocols and algorithms that pertain to the evaluation of patients at risk for COVID-19 are in a state of  rapid change based on information released by regulatory bodies including the CDC and federal and state organizations. These policies and algorithms were followed during the patient's care in the ED.  Some ED evaluations and interventions may be delayed as a result of limited staffing during and the pandemic.*   Note:  This document was prepared using Dragon voice recognition software and may include unintentional dictation errors.      Orman Matsumura, Delice Bison, DO 12/18/20 [phone]

## 2020-12-20 ENCOUNTER — Other Ambulatory Visit: Payer: Self-pay

## 2020-12-20 ENCOUNTER — Non-Acute Institutional Stay: Payer: Medicare Other | Admitting: Internal Medicine

## 2020-12-20 VITALS — BP 107/69 | HR 70 | Temp 97.1°F | Resp 18

## 2020-12-20 DIAGNOSIS — I25119 Atherosclerotic heart disease of native coronary artery with unspecified angina pectoris: Secondary | ICD-10-CM

## 2020-12-20 DIAGNOSIS — I255 Ischemic cardiomyopathy: Secondary | ICD-10-CM

## 2020-12-20 DIAGNOSIS — I5032 Chronic diastolic (congestive) heart failure: Secondary | ICD-10-CM

## 2020-12-20 DIAGNOSIS — E785 Hyperlipidemia, unspecified: Secondary | ICD-10-CM

## 2020-12-20 DIAGNOSIS — R079 Chest pain, unspecified: Secondary | ICD-10-CM

## 2020-12-20 DIAGNOSIS — I10 Essential (primary) hypertension: Secondary | ICD-10-CM

## 2020-12-23 LAB — CULTURE, BLOOD (ROUTINE X 2)
Culture: NO GROWTH
Culture: NO GROWTH

## 2020-12-24 ENCOUNTER — Encounter: Payer: Self-pay | Admitting: Internal Medicine

## 2020-12-24 NOTE — Progress Notes (Signed)
Subjective:    Patient ID: Jason Velazquez, male    DOB: [birth year], 87 y.o.   MRN: [id number]  HPI  Resident seen in [address]  for ER follow up. He went to the ER 11/7 for chest pain that woke him up from his sleep. He has a history of CAD s/p CABG, HTN, HLD, DVT. He was given Nitro prior to transport to the ED and symptoms had completely resolved. ECG was unchanged. Labs showed a mild anemia, not concerning. Chest xray showed possible left basilar opacities but given his lack of cough, SOB, fever, he was not treated for pneumonia. CTA chest was negative for PE or pneumonia.He did not receive any intervention and was discharged back to [facility].  Since discharge, he reports he has not had any recurrent episodes of chest pain. He had recently seen Dr. Saunders Revel for followup. His Isosorbide was increased to 60 mg daily. He is taking his Amlodipine, ASA, Atorvastatin, Plavix, Furosemide and Metoprolol as prescribed. He does not have a follow up with Dr. Saunders Revel at this time.  Review of Systems     Past Medical History:  Diagnosis Date   Allergic rhinitis    Benign prostatic hypertrophy    CAD (coronary artery disease)    a. s/p CABG x 4 (1998; LIMA-LAD, SVG-D1-D2, SVG-rPL); b. 2000 & 2006 s/p PCI of the LCX/OM2; c. 03/2018 Cath: LM 25d, LAD 100ost/m, 27m/d, 1st septal fills via RPDA, D2 100, fills via collats from D3, LCX 90ost/m ISR w/ evidence of stent fracture, 50m, OM2 30 ISR, 80/80, RCA 70p, RPDA 60ost, 70, RPAV 100, VG->D1->D2 100p (culprit), LIMA->LAd nl, VG->RPL2 nl. EF 45%-->Med Rx.   Duodenal diverticulum 02/11/2001   GERD (gastroesophageal reflux disease)    H/O hiatal hernia 02/11/2001   Heart murmur    Hepatic lesion 02/11/2002   History of melanoma excision    FACE   Hypercholesteremia    Hypertension    Left carotid stenosis    > 50%  PER DUPLEX  03-28-2011   Mild mitral regurgitation    Perianal fistula    S/P CABG x 4    1988   S/P drug eluting coronary stent placement    POST  CABG--  STENTING 2000  AND RESTENTING 2006 FOR IN-STENT STENOSIS   [facility])     Current Outpatient Medications  Medication Sig Dispense Refill   amLODipine (NORVASC) 5 MG tablet Take 1 tablet (5 mg total) by mouth daily. 90 tablet 3   aspirin EC 81 MG tablet Take 81 mg by mouth daily. Swallow whole. (Patient not taking: Reported on 12/18/2020)     atorvastatin (LIPITOR) 80 MG tablet TAKE 1 TABLET BY MOUTH EVERYDAY AT BEDTIME 90 tablet 2   clopidogrel (PLAVIX) 75 MG tablet TAKE 1 TABLET BY MOUTH EVERY DAY 90 tablet 3   cyanocobalamin (,VITAMIN B-12,) 1000 MCG/ML injection Inject 1,000 mcg into the muscle every 30 (thirty) days. (Patient not taking: Reported on 12/18/2020)     furosemide (LASIX) 20 MG tablet Take 1 tablet (20 mg total) by mouth daily. 30 tablet 5   isosorbide mononitrate (IMDUR) 60 MG 24 hr tablet Take 1 tablet (60 mg total) by mouth daily. 90 tablet 0   ketoconazole (NIZORAL) 2 % cream Apply 1 application topically 2 (two) times daily. (Patient not taking: Reported on 12/18/2020) 60 g 1   metoprolol succinate (TOPROL-XL) 25 MG 24 hr tablet TAKE 1 TABLET (25 MG TOTAL) BY MOUTH DAILY. TAKE WITH OR IMMEDIATELY FOLLOWING A  MEAL. 90 tablet 3   nitroGLYCERIN (NITROSTAT) 0.4 MG SL tablet PLACE 1 TABLET (0.4 MG TOTAL) UNDER THE TONGUE EVERY 5 (FIVE) MINUTES AS NEEDED FOR CHEST PAIN. (Patient not taking: No sig reported) 25 tablet 5   pantoprazole (PROTONIX) 40 MG tablet TAKE 1 TABLET BY MOUTH EVERY DAY 90 tablet 3   polyethylene glycol (MIRALAX / GLYCOLAX) packet Take 17 g by mouth daily.     tamsulosin (FLOMAX) 0.4 MG CAPS capsule Take 0.4 mg by mouth daily.     No current facility-administered medications for this visit.    Allergies  Allergen Reactions   Tape Rash    ONLY USE PAPER TAPE ONLY USE PAPER TAPE   Iodine Rash    Rash when applied to skin   Niacin And Related Hives and Other (See Comments)    FLUSHING itching    Family History  Problem Relation Age of Onset    Breast cancer Mother    Stroke Father    Colon cancer Brother    Stroke Brother     Social History   Socioeconomic History   Marital status: Widowed    Spouse name: [name]   Number of children: 3   Years of education: B.D.   Highest education level: Not on file  Occupational History   Occupation: Land    Comment: Retired  Tobacco Use   Smoking status: Never   Smokeless tobacco: Never  Vaping Use   Vaping Use: Never used  Substance and Sexual Activity   Alcohol use: No   Drug use: No   Sexual activity: Not Currently  Other Topics Concern   Not on file  Social History Narrative   Lives alone, at [place].  Has some help with housecleaning but o/w he is independent.     Was married to first wife 61 years, then to second wife 12 years.  Widowed twice.        Retired Theme park manager, [employer]   One son was killed in [place] in 1984   One son and one daughter (she is an Therapist, sports) still alive in       Has living will   Daughter is health care POA   Has DNR    No feeding tube if cognitively unaware   Social Determinants of Radio broadcast assistant Strain: Not on file  Food Insecurity: Not on file  Transportation Needs: Not on file  Physical Activity: Not on file  Stress: Not on file  Social Connections: Not on file  Intimate Partner Violence: Not on file     Constitutional: Denies fever, malaise, fatigue, headache or abrupt weight changes.  HEENT: Denies eye pain, eye redness, ear pain, ringing in the ears, wax buildup, runny nose, nasal congestion, bloody nose, or sore throat. Respiratory: Denies difficulty breathing, shortness of breath, cough or sputum production.   Cardiovascular: Pt reports intermittent chest pain. Denies chest tightness, palpitations or swelling in the hands or feet.  Gastrointestinal: Denies abdominal pain, bloating, constipation, diarrhea or blood in the stool.  Musculoskeletal: Denies decrease in range of motion,  difficulty with gait, muscle pain or joint pain and swelling.  Skin: Denies redness, rashes, lesions or ulcercations.   No other specific complaints in a complete review of systems (except as listed in HPI above).  Objective:   Physical Exam  BP 107/69   Pulse 70   Temp (!) 97.1 F (36.2 C)   Resp 18  Wt Readings from Last 3  Encounters:  12/18/20 188 lb (85.3 kg)  12/07/20 205 lb 2 oz (93 kg)  12/04/20 206 lb 11.2 oz (93.8 kg)    General: Appears his stated age, obese in NAD. Skin: Warm, dry and intact.  Cardiovascular: Normal rate and rhythm. S1,S2 noted.  No murmur, rubs or gallops noted. No JVD or BLE edema.  Pulmonary/Chest: Normal effort and positive vesicular breath sounds. No respiratory distress. No wheezes, rales or ronchi noted.  Abdomen: Soft and nontender. Normal bowel sounds.  Neurological: Alert and oriented.   BMET    Component Value Date/Time   NA 144 12/18/2020 0248   K 4.0 12/18/2020 0248   CL 106 12/18/2020 0248   CO2 26 12/18/2020 0248   GLUCOSE 106 (H) 12/18/2020 0248   BUN 12 12/18/2020 0248   CREATININE 1.03 12/18/2020 0248   CALCIUM 9.2 12/18/2020 0248   GFRNONAA >60 12/18/2020 0248   GFRAA >60 04/01/2018 0520    Lipid Panel     Component Value Date/Time   CHOL 137 03/31/2018 0552   CHOL 138 12/16/2012 0837   TRIG 64 03/31/2018 0552   HDL 42 03/31/2018 0552   HDL 46 12/16/2012 0837   CHOLHDL 3.3 03/31/2018 0552   VLDL 13 03/31/2018 0552   LDLCALC 82 03/31/2018 0552   LDLCALC 75 12/16/2012 0837    CBC    Component Value Date/Time   WBC 6.8 12/18/2020 0248   RBC 4.10 (L) 12/18/2020 0248   HGB 12.5 (L) 12/18/2020 0248   HCT 37.3 (L) 12/18/2020 0248   PLT 222 12/18/2020 0248   MCV 91.0 12/18/2020 0248   MCH 30.5 12/18/2020 0248   MCHC 33.5 12/18/2020 0248   RDW 12.9 12/18/2020 0248   LYMPHSABS 1.6 03/31/2018 0223   MONOABS 0.7 03/31/2018 0223   EOSABS 0.2 03/31/2018 0223   BASOSABS 0.1 03/31/2018 0223    Hgb A1C Lab Results   Component Value Date   HGBA1C 5.8 (H) 05/27/2013            Assessment & Plan:    ER Follow Up  for Nonspecific Chest Pain:  ER notes, labs and imaging reviewed No recurrent episodes of chest pain He has Nitro on hand in case this reoccurs Will have him follow up with Dr. Saunders Revel as previously scheduled  Will reassess as needed Webb Silversmith, NP This visit occurred during the SARS-CoV-2 public health emergency.  Safety protocols were in place, including screening questions prior to the visit, additional usage of staff PPE, and extensive cleaning of exam room while observing appropriate contact time as indicated for disinfecting solutions.

## 2020-12-24 NOTE — Patient Instructions (Signed)
Nonspecific Chest Pain Chest pain can be caused by many different conditions. Some causes of chest pain can be life-threatening. These will require treatment right away. Serious causes of chest pain include: Heart attack. A tear in the body's main blood vessel. Redness and swelling (inflammation) around your heart. Blood clot in your lungs. Other causes of chest pain may not be so serious. These include: Heartburn. Anxiety or stress. Damage to bones or muscles in your chest. Lung infections. Chest pain can feel like: Pain or discomfort in your chest. Crushing, pressure, aching, or squeezing pain. Burning or tingling. Dull or sharp pain that is worse when you move, cough, or take a deep breath. Pain or discomfort that is also felt in your back, neck, jaw, shoulder, or arm, or pain that spreads to any of these areas. It is hard to know whether your pain is caused by something that is serious or something that is not so serious. So it is important to see your doctor right away if you have chest pain. Follow these instructions at home: Medicines Take over-the-counter and prescription medicines only as told by your doctor. If you were prescribed an antibiotic medicine, take it as told by your doctor. Do not stop taking the antibiotic even if you start to feel better. Lifestyle  Rest as told by your doctor. Do not use any products that contain nicotine or tobacco, such as cigarettes, e-cigarettes, and chewing tobacco. If you need help quitting, ask your doctor. Do not drink alcohol. Make lifestyle changes as told by your doctor. These may include: Getting regular exercise. Ask your doctor what activities are safe for you. Eating a heart-healthy diet. A diet and nutrition specialist (dietitian) can help you to learn healthy eating options. Staying at a healthy weight. Treating diabetes or high blood pressure, if needed. Lowering your stress. Activities such as yoga and relaxation techniques  can help. General instructions Pay attention to any changes in your symptoms. Tell your doctor about them or any new symptoms. Avoid any activities that cause chest pain. Keep all follow-up visits as told by your doctor. This is important. You may need more testing if your chest pain does not go away. Contact a doctor if: Your chest pain does not go away. You feel depressed. You have a fever. Get help right away if: Your chest pain is worse. You have a cough that gets worse, or you cough up blood. You have very bad (severe) pain in your belly (abdomen). You pass out (faint). You have either of these for no clear reason: Sudden chest discomfort. Sudden discomfort in your arms, back, neck, or jaw. You have shortness of breath at any time. You suddenly start to sweat, or your skin gets clammy. You feel sick to your stomach (nauseous). You throw up (vomit). You suddenly feel lightheaded or dizzy. You feel very weak or tired. Your heart starts to beat fast, or it feels like it is skipping beats. These symptoms may be an emergency. Do not wait to see if the symptoms will go away. Get medical help right away. Call your local emergency services (911 in the [place] Do not drive yourself to the hospital. Summary Chest pain can be caused by many different conditions. The cause may be serious and need treatment right away. If you have chest pain, see your doctor right away. Follow your doctor's instructions for taking medicines and making lifestyle changes. Keep all follow-up visits as told by your doctor. This includes visits for any further   testing if your chest pain does not go away. Be sure to know the signs that show that your condition has become worse. Get help right away if you have these symptoms. This information is not intended to replace advice given to you by your health care provider. Make sure you discuss any questions you have with your health care provider. Document Revised:  04/13/2020 Document Reviewed: 04/13/2020 Elsevier Patient Education  [place].

## 2020-12-28 DIAGNOSIS — I10 Essential (primary) hypertension: Secondary | ICD-10-CM | POA: Diagnosis not present

## 2020-12-28 DIAGNOSIS — I5032 Chronic diastolic (congestive) heart failure: Secondary | ICD-10-CM | POA: Diagnosis not present

## 2020-12-28 DIAGNOSIS — E539 Vitamin B deficiency, unspecified: Secondary | ICD-10-CM | POA: Diagnosis not present

## 2020-12-28 DIAGNOSIS — I2510 Atherosclerotic heart disease of native coronary artery without angina pectoris: Secondary | ICD-10-CM | POA: Diagnosis not present

## 2021-01-10 ENCOUNTER — Encounter: Payer: Self-pay | Admitting: Nurse Practitioner

## 2021-01-10 ENCOUNTER — Ambulatory Visit (INDEPENDENT_AMBULATORY_CARE_PROVIDER_SITE_OTHER): Payer: Medicare Other | Admitting: Nurse Practitioner

## 2021-01-10 ENCOUNTER — Other Ambulatory Visit: Payer: Self-pay

## 2021-01-10 VITALS — BP 120/70 | HR 65 | Ht 64.0 in | Wt 205.0 lb

## 2021-01-10 DIAGNOSIS — I255 Ischemic cardiomyopathy: Secondary | ICD-10-CM

## 2021-01-10 DIAGNOSIS — E785 Hyperlipidemia, unspecified: Secondary | ICD-10-CM | POA: Diagnosis not present

## 2021-01-10 DIAGNOSIS — I25119 Atherosclerotic heart disease of native coronary artery with unspecified angina pectoris: Secondary | ICD-10-CM

## 2021-01-10 DIAGNOSIS — I5032 Chronic diastolic (congestive) heart failure: Secondary | ICD-10-CM | POA: Diagnosis not present

## 2021-01-10 DIAGNOSIS — I10 Essential (primary) hypertension: Secondary | ICD-10-CM

## 2021-01-10 NOTE — Patient Instructions (Signed)
Medication Instructions:  Your physician recommends that you continue on your current medications as directed. Please refer to the Current Medication list given to you today.  *If you need a refill on your cardiac medications before your next appointment, please call your pharmacy*   Lab Work: None ordered If you have labs (blood work) drawn today and your tests are completely normal, you will receive your results only by: [facility] (if you have MyChart) OR A paper copy in the mail If you have any lab test that is abnormal or we need to change your treatment, we will call you to review the results.   Testing/Procedures: None ordered   Follow-Up: At [facility] you and your health needs are our priority.  As part of our continuing mission to provide you with exceptional heart care, we have created designated Provider Care Teams.  These Care Teams include your primary Cardiologist (physician) and Advanced Practice Providers (APPs -  Physician Assistants and Nurse Practitioners) who all work together to provide you with the care you need, when you need it.  We recommend signing up for the patient portal called "MyChart".  Sign up information is provided on this After Visit Summary.  MyChart is used to connect with patients for Virtual Visits (Telemedicine).  Patients are able to view lab/test results, encounter notes, upcoming appointments, etc.  Non-urgent messages can be sent to your provider as well.   To learn more about what you can do with MyChart, go to [email or web address].    Your next appointment:   Follow 6-8 weeks   The format for your next appointment:   In Person  Provider:   You may see Nelva Bush, MD or one of the following Advanced Practice Providers on your designated Care Team:   Murray Hodgkins, NP Christell Faith, PA-C Cadence Kathlen Mody, [place]   Other Instructions

## 2021-01-10 NOTE — Progress Notes (Signed)
Office Visit    Patient Name: Jason Velazquez Date of Encounter: 01/10/2021  Primary Care Provider:  Venia Carbon, MD Primary Cardiologist:  Nelva Bush, MD  Chief Complaint    87 year old male with a history of CAD status post remote CABG, hypertension, hyperlipidemia, and stroke, who presents for follow-up related to chest pain.  Past Medical History    Past Medical History:  Diagnosis Date   Allergic rhinitis    Benign prostatic hypertrophy    CAD (coronary artery disease)    a. s/p CABG x 4 (1998; LIMA-LAD, SVG-D1-D2, SVG-rPL); b. 2000 & 2006 s/p PCI of the LCX/OM2; c. 03/2018 Cath: LM 25d, LAD 100ost/m, 63m/d, 1st septal fills via RPDA, D2 100, fills via collats from D3, LCX 90ost/m ISR w/ evidence of stent fracture, 102m, OM2 30 ISR, 80/80, RCA 70p, RPDA 60ost, 70, RPAV 100, VG->D1->D2 100p (culprit), LIMA->LAd nl, VG->RPL2 nl. EF 45%-->Med Rx.   Duodenal diverticulum 02/11/2001   GERD (gastroesophageal reflux disease)    H/O hiatal hernia 02/11/2001   Heart murmur    Hepatic lesion 02/11/2002   History of melanoma excision    FACE   Hypercholesteremia    Hypertension    Left carotid stenosis    > 50%  PER DUPLEX  03-28-2011   Mild mitral regurgitation    Perianal fistula    S/P CABG x 4    1988   S/P drug eluting coronary stent placement    POST CABG--  STENTING 2000  AND RESTENTING 2006 FOR IN-STENT STENOSIS   Stroke [facility])    Past Surgical History:  Procedure Laterality Date   CATARACT EXTRACTION W/ INTRAOCULAR LENS  IMPLANT, BILATERAL     CORONARY ANGIOPLASTY WITH STENT PLACEMENT  2000   PCI AND STENTING CIRCUMFLEX   CORONARY ANGIOPLASTY WITH STENT PLACEMENT  04-30-2004  DR Daneen Schick   RE-INSTENT STENOSIS/  DRUG-ELUTING STENT OF PROXIMAL AND MID CIRCUMFLEX/ PCI MID [place] STRUT/  WIDELY PATENT SAPHENOUS VEIN GRAFT AND  LIMA TO LAD GRAFT/ TOTAL OCCLUSION RIGHT CORONARY BEYOND THE POSTERIOR DESCENDING ARTERY BRANCH WITH 50% OSTIAL  NARROWING/ TOTAL OCCLUSION OF LAD AT THE OSTIUM OF THE LEFT MAIN   CORONARY ARTERY BYPASS GRAFT  1988   X5   EVALUATION UNDER ANESTHESIA WITH ANAL FISTULECTOMY N/A 07/22/2012   Procedure: EXAM UNDER ANESTHESIA WITH ANAL fistulotomy;  Surgeon: Leighton Ruff, MD;  Location: [place];  Service: General;  Laterality: N/A;   LEFT HEART CATH AND CORS/GRAFTS ANGIOGRAPHY N/A 03/31/2018   Procedure: LEFT HEART CATH AND CORS/GRAFTS ANGIOGRAPHY;  Surgeon: Nelva Bush, MD;  Location: [place] [facility];  Service: Cardiovascular;  Laterality: N/A;   SHOULDER ARTHROSCOPY WITH OPEN ROTATOR CUFF REPAIR Right 07-10-1999   TONSILLECTOMY AND ADENOIDECTOMY  1944   TRANSTHORACIC ECHOCARDIOGRAM  03-01-2011  DR Daneen Schick   NORMAL LVF AND LV SIZE/ MILD LEFT ATRIAL ENLARGEMENT/ GRADE II DIASTOLIC DYSFUNCTION WITH ELEVATED LEFT ATRIAL PRESSURE/ MILD TO MODERATE MR/ MILD TR   TRANSURETHRAL RESECTION OF PROSTATE  04/09/2011   Procedure: TRANSURETHRAL RESECTION OF THE PROSTATE WITH GYRUS INSTRUMENTS;  Surgeon: Malka So, MD;  Location: [facility];  Service: Urology;  Laterality: N/A;    Allergies  Allergies  Allergen Reactions   Tape Rash    ONLY USE PAPER TAPE ONLY USE PAPER TAPE   Iodine Rash    Rash when applied to skin   Niacin And Related Hives and Other (See Comments)    FLUSHING itching  History of Present Illness    87 year old male with above complex past medical history including CAD status post CABG times 05/30/1996, with subsequent stenting of the left circumflex and OM 2 in 2000 and 2006 respectively.  Other history includes hypertension, hyperlipidemia, and stroke.  In February 2020, he was admitted with chest pain and underwent diagnostic catheterization revealing 2 of 4 patent grafts, with new finding of occlusion of the sequential vein graft to the diagonal branches.  He also had in-stent restenosis in the left circumflex, and moderate disease in the native LAD and RCA.   Medical therapy was recommended and he has been managed with aspirin, statin, Plavix, beta-blocker, calcium channel blocker, and nitrate therapy.  Echocardiogram during February 2020 admission showed an EF of 55 to 60% with mild anterolateral hypokinesis, impaired relaxation, and mild to moderate tricuspid regurgitation.  Mr. Raschke was last seen in clinic on October 27, at which time he reported an episode of exertional discomfort that occurred in the bilateral subclavicular areas, while walking at his facility.  This resolved with rest, and did not recur on subsequent days despite walking a mile each day.  His Imdur dose was increased to 60 mg daily.  He also had significant lower extremity swelling and was advised to take additional Lasix for weights above 195 pounds.  He was also encouraged to use compression hose.  Unfortunately, he was seen in the emergency department on November 7, due to chest pain that awoke him from sleep.  Symptoms only lasted 2 to 3 minutes.  CTA of the chest was negative for PE or other acute abnormality.  Troponins were normal, and ECG/labs were otherwise unremarkable, and he was discharged home.  He followed up with primary care November 9, at which time he had no further complaints of chest pain.  Today, he says that he has cont to walk 1 mile, 6x/wk w/o any symptoms or limitations.  He has not had any recurrent chest pain and denies palpitations, dyspnea, pnd, orthopnea, n, v, dizziness, syncope, weight gain, or early satiety.  Some degree of chronic lower extremity swelling that has improved with use of compression socks.  Home Medications    Current Outpatient Medications  Medication Sig Dispense Refill   amLODipine (NORVASC) 5 MG tablet Take 1 tablet (5 mg total) by mouth daily. 90 tablet 3   aspirin EC 81 MG tablet Take 81 mg by mouth daily. Swallow whole.     atorvastatin (LIPITOR) 80 MG tablet TAKE 1 TABLET BY MOUTH EVERYDAY AT BEDTIME 90 tablet 2   clopidogrel  (PLAVIX) 75 MG tablet TAKE 1 TABLET BY MOUTH EVERY DAY 90 tablet 3   cyanocobalamin (,VITAMIN B-12,) 1000 MCG/ML injection Inject 1,000 mcg into the muscle every 30 (thirty) days.     furosemide (LASIX) 20 MG tablet Take 1 tablet (20 mg total) by mouth daily. 30 tablet 5   isosorbide mononitrate (IMDUR) 60 MG 24 hr tablet Take 1 tablet (60 mg total) by mouth daily. 90 tablet 0   ketoconazole (NIZORAL) 2 % cream Apply 1 application topically 2 (two) times daily. 60 g 1   metoprolol succinate (TOPROL-XL) 25 MG 24 hr tablet TAKE 1 TABLET (25 MG TOTAL) BY MOUTH DAILY. TAKE WITH OR IMMEDIATELY FOLLOWING A MEAL. 90 tablet 3   nitroGLYCERIN (NITROSTAT) 0.4 MG SL tablet PLACE 1 TABLET (0.4 MG TOTAL) UNDER THE TONGUE EVERY 5 (FIVE) MINUTES AS NEEDED FOR CHEST PAIN. 25 tablet 5   pantoprazole (PROTONIX) 40 MG tablet TAKE  1 TABLET BY MOUTH EVERY DAY 90 tablet 3   polyethylene glycol (MIRALAX / GLYCOLAX) packet Take 17 g by mouth daily.     tamsulosin (FLOMAX) 0.4 MG CAPS capsule Take 0.4 mg by mouth daily.     No current facility-administered medications for this visit.     Review of Systems    Some degree of chronic lower extremity edema, that has improved with compression socks.  As above, brief episode of chest pain that awoke him from sleep several weeks ago.  This has not recurred.  He denies chest pain, dyspnea, palpitations, PND, orthopnea, dizziness, syncope, or early satiety.  All other systems reviewed and are otherwise negative except as noted above.   Physical Exam    VS:  BP 120/70 (BP Location: Left Arm, Patient Position: Sitting, Cuff Size: Large)   Pulse 65   Ht 5\' 4"  (1.626 m)   Wt 205 lb (93 kg)   SpO2 97%   BMI 35.19 kg/m  , BMI Body mass index is 35.19 kg/m.     GEN: Well nourished, well developed, in no acute distress. HEENT: normal. Neck: Supple, no JVD, carotid bruits, or masses. Cardiac: RRR, no murmurs, rubs, or gallops. No clubbing, cyanosis, 1+ bilateral lower  extremity edema-improved-currently wearing compression socks.  Radials 2+/PT 1+ and equal bilaterally.  Respiratory:  Respirations regular and unlabored, clear to auscultation bilaterally. GI: Soft, nontender, nondistended, BS + x 4. MS: no deformity or atrophy. Skin: warm and dry, no rash. Neuro:  Strength and sensation are intact. Psych: Normal affect.  Accessory Clinical Findings    ECG personally reviewed by me today -regular sinus rhythm, 65- no acute changes.   Labs from [employer] dated 12/28/2020  Total cholesterol 112, HDL 40, triglycerides 72, LDL 57 Sodium 137, potassium 4.4, chloride 103, CO2 27, BUN 11, creatinine 0.98, glucose 100 Calcium 8.6, total protein 6.4, albumin 3.9, globulin 2.5 Total bilirubin 0.7, alkaline phosphatase 88, AST 20, ALT 17 BNP 83 Hemoglobin 13.1, hematocrit 39.7, WBC 6.5, platelets 237   Lab Results  Component Value Date   HGBA1C 5.8 (H) 05/27/2013  _____________ Cardiac Catheterization  03/31/2018    Assessment & Plan    1.  CAD/chest pain: Status post CABG x3 in 1998 with subsequent circumflex/OM stenting and diagnostic catheterization in February 2020 revealing severe multivessel disease with occluded sequential vein graft to the diagonal branches.  Medical therapy was advised and he is generally done well.  Following his last visit, he was seen in the emergency department for a 2 to 3-minute episode of subclavicular pain that awoke him from sleep.  ECG, laboratory evaluation, and CT of the chest were unremarkable.  Since that episode, he has had no chest pain despite continuing to walk 1 mile 6 times per week.  We discussed options for management today and agreed to continue medical therapy which includes aspirin, statin, Plavix, beta-blocker, calcium channel blocker, and isosorbide mononitrate.  2.  Chronic HFpEF: EF of 55 to 60% by echo in February 2020.  Weight stable at 205 pounds on our scale.  He typically runs about 200 pounds  on his home scale.  I suspect a true dry weight would be around 195 pounds.  He does have chronic lower extremity swelling which was 2+ on his visit in October and with the use of compression socks, it is 1+ currently.  He is currently using Lasix 20 mg daily and I encouraged him to use an additional dose for weight over 200  pounds.  He has not been having any dyspnea continues to walk regularly.  Heart rate and blood pressure stable.  3.  Essential hypertension: Stable.  4.  Hyperlipidemia: Recent lipids on November 17 with an LDL of 57 and normal LFTs.  He remains on statin therapy.  5.  Disposition: Follow-up in 4 to 6 weeks or sooner if necessary.   Murray Hodgkins, NP 01/10/2021, 9:00 AM

## 2021-01-26 ENCOUNTER — Ambulatory Visit: Payer: Medicare Other | Admitting: Physician Assistant

## 2021-03-01 ENCOUNTER — Encounter: Payer: Self-pay | Admitting: Nurse Practitioner

## 2021-03-01 ENCOUNTER — Ambulatory Visit (INDEPENDENT_AMBULATORY_CARE_PROVIDER_SITE_OTHER): Payer: Medicare Other | Admitting: Nurse Practitioner

## 2021-03-01 ENCOUNTER — Ambulatory Visit: Payer: Medicare Other | Admitting: Internal Medicine

## 2021-03-01 ENCOUNTER — Other Ambulatory Visit: Payer: Self-pay

## 2021-03-01 VITALS — BP 130/70 | HR 70 | Ht 64.0 in | Wt 205.0 lb

## 2021-03-01 DIAGNOSIS — I208 Other forms of angina pectoris: Secondary | ICD-10-CM

## 2021-03-01 DIAGNOSIS — E785 Hyperlipidemia, unspecified: Secondary | ICD-10-CM

## 2021-03-01 DIAGNOSIS — I25119 Atherosclerotic heart disease of native coronary artery with unspecified angina pectoris: Secondary | ICD-10-CM

## 2021-03-01 DIAGNOSIS — I10 Essential (primary) hypertension: Secondary | ICD-10-CM | POA: Diagnosis not present

## 2021-03-01 NOTE — Patient Instructions (Signed)
Medication Instructions:  No changes at this time.  *If you need a refill on your cardiac medications before your next appointment, please call your pharmacy*   Lab Work: None  If you have labs (blood work) drawn today and your tests are completely normal, you will receive your results only by: [place] (if you have MyChart) OR A paper copy in the mail If you have any lab test that is abnormal or we need to change your treatment, we will call you to review the results.   Testing/Procedures: [place]  Your caregiver has ordered a Stress Test with nuclear imaging. The purpose of this test is to evaluate the blood supply to your heart muscle. This procedure is referred to as a "Non-Invasive Stress Test." This is because other than having an IV started in your vein, nothing is inserted or "invades" your body. Cardiac stress tests are done to find areas of poor blood flow to the heart by determining the extent of coronary artery disease (CAD). Some patients exercise on a treadmill, which naturally increases the blood flow to your heart, while others who are  unable to walk on a treadmill due to physical limitations have a pharmacologic/chemical stress agent called Lexiscan . This medicine will mimic walking on a treadmill by temporarily increasing your coronary blood flow.   Please note: these test may take anywhere between 2-4 hours to complete  PLEASE REPORT TO [place] AT THE FIRST DESK WILL DIRECT YOU WHERE TO GO  Date of Procedure:_____________________________  Arrival Time for Procedure:___________________________  Instructions regarding medication:   __XX__ : Hold diabetes medication morning of procedure   _XX___:  Hold Furosemide the morning of your test.   PLEASE NOTIFY THE OFFICE AT LEAST 24 HOURS IN ADVANCE IF YOU ARE UNABLE TO KEEP YOUR APPOINTMENT.  [phone] AND  PLEASE NOTIFY NUCLEAR MEDICINE AT [facility] AT LEAST 24 HOURS IN  ADVANCE IF YOU ARE UNABLE TO KEEP YOUR APPOINTMENT. [phone]  How to prepare for your Myoview test:  Do not eat or drink after midnight No caffeine for 24 hours prior to test No smoking 24 hours prior to test. Your medication may be taken with water.  If your doctor stopped a medication because of this test, do not take that medication. Ladies, please do not wear dresses.  Skirts or pants are appropriate. Please wear a short sleeve shirt. No perfume, cologne or lotion. Wear comfortable walking shoes. No heels!   Follow-Up: At [facility] you and your health needs are our priority.  As part of our continuing mission to provide you with exceptional heart care, we have created designated Provider Care Teams.  These Care Teams include your primary Cardiologist (physician) and Advanced Practice Providers (APPs -  Physician Assistants and Nurse Practitioners) who all work together to provide you with the care you need, when you need it.   Your next appointment:   6-8 week(s)  The format for your next appointment:   In Person  Provider:   Nelva Bush, MD or Murray Hodgkins, NP

## 2021-03-01 NOTE — Progress Notes (Signed)
Office Visit    Patient Name: Jason Velazquez Date of Encounter: 03/01/2021  Primary Care Provider:  Venia Carbon, MD Primary Cardiologist:  Nelva Bush, MD  Chief Complaint    88 year old male with history of CAD status post remote CABG, hypertension, hyperlipidemia, and stroke, who presents for follow-up related to CAD and bilateral clavicular pain.  Past Medical History    Past Medical History:  Diagnosis Date   Allergic rhinitis    Benign prostatic hypertrophy    CAD (coronary artery disease)    a. s/p CABG x 4 (1998; LIMA-LAD, SVG-D1-D2, SVG-rPL); b. 2000 & 2006 s/p PCI of the LCX/OM2; c. 03/2018 Cath: LM 25d, LAD 100ost/m, 59m/d, 1st septal fills via RPDA, D2 100, fills via collats from D3, LCX 90ost/m ISR w/ evidence of stent fracture, 48m, OM2 30 ISR, 80/80, RCA 70p, RPDA 60ost, 70, RPAV 100, VG->D1->D2 100p (culprit), LIMA->LAd nl, VG->RPL2 nl. EF 45%-->Med Rx.   Duodenal diverticulum 02/11/2001   GERD (gastroesophageal reflux disease)    H/O hiatal hernia 02/11/2001   Heart murmur    a. 03/2018 Echo: EF 55-60%, DD, mild anterolateral HK.  Mild-mod TR. Mild Ca2+ of AoV and MV.   Hepatic lesion 02/11/2002   History of melanoma excision    FACE   Hypercholesteremia    Hypertension    Left carotid stenosis    > 50%  PER DUPLEX  03-28-2011   Mild mitral regurgitation    Perianal fistula    S/P CABG x 4    1988   S/P drug eluting coronary stent placement    POST CABG--  STENTING 2000  AND RESTENTING 2006 FOR IN-STENT STENOSIS   Stroke [facility])    Past Surgical History:  Procedure Laterality Date   CATARACT EXTRACTION W/ INTRAOCULAR LENS  IMPLANT, BILATERAL     CORONARY ANGIOPLASTY WITH STENT PLACEMENT  2000   PCI AND STENTING CIRCUMFLEX   CORONARY ANGIOPLASTY WITH STENT PLACEMENT  04-30-2004  DR Daneen Schick   RE-INSTENT STENOSIS/  DRUG-ELUTING STENT OF PROXIMAL AND MID CIRCUMFLEX/ PCI MID [place] STRUT/  WIDELY PATENT SAPHENOUS VEIN GRAFT AND   LIMA TO LAD GRAFT/ TOTAL OCCLUSION RIGHT CORONARY BEYOND THE POSTERIOR DESCENDING ARTERY BRANCH WITH 50% OSTIAL NARROWING/ TOTAL OCCLUSION OF LAD AT THE OSTIUM OF THE LEFT MAIN   CORONARY ARTERY BYPASS GRAFT  1988   X5   EVALUATION UNDER ANESTHESIA WITH ANAL FISTULECTOMY N/A 07/22/2012   Procedure: EXAM UNDER ANESTHESIA WITH ANAL fistulotomy;  Surgeon: Leighton Ruff, MD;  Location: [place];  Service: General;  Laterality: N/A;   LEFT HEART CATH AND CORS/GRAFTS ANGIOGRAPHY N/A 03/31/2018   Procedure: LEFT HEART CATH AND CORS/GRAFTS ANGIOGRAPHY;  Surgeon: Nelva Bush, MD;  Location: [place] [facility];  Service: Cardiovascular;  Laterality: N/A;   SHOULDER ARTHROSCOPY WITH OPEN ROTATOR CUFF REPAIR Right 07-10-1999   TONSILLECTOMY AND ADENOIDECTOMY  1944   TRANSTHORACIC ECHOCARDIOGRAM  03-01-2011  DR Daneen Schick   NORMAL LVF AND LV SIZE/ MILD LEFT ATRIAL ENLARGEMENT/ GRADE II DIASTOLIC DYSFUNCTION WITH ELEVATED LEFT ATRIAL PRESSURE/ MILD TO MODERATE MR/ MILD TR   TRANSURETHRAL RESECTION OF PROSTATE  04/09/2011   Procedure: TRANSURETHRAL RESECTION OF THE PROSTATE WITH GYRUS INSTRUMENTS;  Surgeon: Malka So, MD;  Location: [facility];  Service: Urology;  Laterality: N/A;    Allergies  Allergies  Allergen Reactions   Tape Rash    ONLY USE PAPER TAPE ONLY USE PAPER TAPE   Iodine Rash  Rash when applied to skin   Niacin And Related Hives and Other (See Comments)    FLUSHING itching    History of Present Illness    88 year old male with the above complex past medical history including CAD status post CABG times 05/30/1996, with subsequent stenting of the circumflex and OM 2 in 2000 and 2006 respectively.  Other history includes hypertension, hyperlipidemia, and stroke.  In February 2020, he was admitted with chest pain and underwent diagnostic catheterization revealing 2 of 4 patent grafts, with new finding of occlusion of the sequential vein graft to the diagonal  branches.  He also had in-stent restenosis within the left circumflex and moderate disease in the native LAD and RCA.  Medical therapy was recommended and he has been managed with aspirin, statin, Plavix, beta-blocker, calcium channel blocker, and nitrate therapy.  Echocardiogram during admission showed an EF of 55 to 60% with mild anterolateral hypokinesis, impaired relaxation, and mild to moderate TR.   In the setting of an episode of bilateral clavicular pain in October, his Imdur dose was increased to 60 mg daily.  On November 7, he had an episode of central, throbbing chest pain that awoke him from sleep that only lasted about 2 to 3 minutes.  He was taken to the ED where chest x-ray was concerning for possible left lower lobe infiltrate but CT showed no acute process.  COVID and flu swabs were negative.  High-sensitivity troponin was normal x2.  He was advised to follow-up with cardiology as an outpatient.  At his last office visit on November 30, he reported continuing to walk regularly without symptoms or limitations and we again agreed on conservative medical therapy.  Volume status was stable and he was encouraged to use additional Lasix for weights over 200 pounds.    Since his last visit, he has had 2 or 3 more episodes of clavicular pain, typically occurring at rest or awakening him from sleep.  He continues to walk a mile 6 days a week without symptoms or limitations.  He is concerned about clavicular pain and would be interested in stress testing.  He does not experience substernal chest discomfort and does not really remember what his prior anginal equivalent felt like.  He also denies dyspnea on exertion, as long as his pace is slow.  On his home scale, he has been weighing about 188 to 195 pounds.  He continues to have mild lower extremity swelling which he manages with once daily Lasix and compression socks.  He denies PND, orthopnea, dizziness, syncope, or early satiety.  Home Medications     Current Outpatient Medications  Medication Sig Dispense Refill   amLODipine (NORVASC) 5 MG tablet Take 1 tablet (5 mg total) by mouth daily. 90 tablet 3   aspirin EC 81 MG tablet Take 81 mg by mouth daily. Swallow whole.     atorvastatin (LIPITOR) 80 MG tablet TAKE 1 TABLET BY MOUTH EVERYDAY AT BEDTIME 90 tablet 2   clopidogrel (PLAVIX) 75 MG tablet TAKE 1 TABLET BY MOUTH EVERY DAY 90 tablet 3   cyanocobalamin (,VITAMIN B-12,) 1000 MCG/ML injection Inject 1,000 mcg into the muscle every 30 (thirty) days.     furosemide (LASIX) 20 MG tablet Take 1 tablet (20 mg total) by mouth daily. 30 tablet 5   isosorbide mononitrate (IMDUR) 60 MG 24 hr tablet Take 1 tablet (60 mg total) by mouth daily. 90 tablet 0   ketoconazole (NIZORAL) 2 % cream Apply 1 application topically 2 (two)  times daily. 60 g 1   metoprolol succinate (TOPROL-XL) 25 MG 24 hr tablet TAKE 1 TABLET (25 MG TOTAL) BY MOUTH DAILY. TAKE WITH OR IMMEDIATELY FOLLOWING A MEAL. 90 tablet 3   nitroGLYCERIN (NITROSTAT) 0.4 MG SL tablet PLACE 1 TABLET (0.4 MG TOTAL) UNDER THE TONGUE EVERY 5 (FIVE) MINUTES AS NEEDED FOR CHEST PAIN. 25 tablet 5   pantoprazole (PROTONIX) 40 MG tablet TAKE 1 TABLET BY MOUTH EVERY DAY 90 tablet 3   polyethylene glycol (MIRALAX / GLYCOLAX) packet Take 17 g by mouth daily.     tamsulosin (FLOMAX) 0.4 MG CAPS capsule Take 0.4 mg by mouth daily.     No current facility-administered medications for this visit.     Review of Systems    Intermittent bilateral clavicular pain as outlined above.  This typically occurs at rest.  Mild lower extremity swelling which he manages with once daily Lasix and compression socks.  He denies dyspnea, palpitations, PND, orthopnea, dizziness, syncope, or early satiety.  All other systems reviewed and are otherwise negative except as noted above.    Physical Exam    VS:  BP 130/70 (BP Location: Left Arm, Patient Position: Sitting, Cuff Size: Normal)    Pulse 70    Ht 5\' 4"  (1.626 m)     Wt 205 lb (93 kg)    SpO2 97%    BMI 35.19 kg/m  , BMI Body mass index is 35.19 kg/m.     GEN: Well nourished, well developed, in no acute distress. HEENT: normal. Neck: Supple, no JVD, carotid bruits, or masses. Cardiac: RRR, no murmurs, rubs, or gallops. No clubbing, cyanosis, 1+ bilateral lower extremity edema.  Radials/PT 1+ and equal bilaterally.  Respiratory:  Respirations regular and unlabored, clear to auscultation bilaterally. GI: Soft, nontender, nondistended, BS + x 4. MS: no deformity or atrophy. Skin: warm and dry, no rash. Neuro:  Strength and sensation are intact. Psych: Normal affect.  Accessory Clinical Findings     Lab Results  Component Value Date   WBC 6.8 12/18/2020   HGB 12.5 (L) 12/18/2020   HCT 37.3 (L) 12/18/2020   MCV 91.0 12/18/2020   PLT 222 12/18/2020   Lab Results  Component Value Date   CREATININE 1.03 12/18/2020   BUN 12 12/18/2020   NA 144 12/18/2020   K 4.0 12/18/2020   CL 106 12/18/2020   CO2 26 12/18/2020   Lab Results  Component Value Date   ALT 17 09/26/2020   AST 23 09/26/2020   ALKPHOS 79 09/26/2020   BILITOT 0.8 09/26/2020   Lab Results  Component Value Date   CHOL 137 03/31/2018   HDL 42 03/31/2018   LDLCALC 82 03/31/2018   TRIG 64 03/31/2018   CHOLHDL 3.3 03/31/2018    Lab Results  Component Value Date   HGBA1C 5.8 (H) 05/27/2013   Cardiac Catheterization  2.18.2020  Intervention _____________   Assessment & Plan    1.  CAD/bilateral clavicular pain: Status post CABG times 04/29/1996 with subsequent circumflex/OM stenting and diagnostic catheterization in February 2020, revealing severe multivessel disease with occluded sequential vein graft to the diagonal branches.  He had significant in-stent restenosis throughout the left circumflex, proximal RCA disease, and distal LAD disease, beyond the anastomosis of the LIMA.  He has been medically managed since.  Since October, he has had several episodes of bilateral  clavicular pain, typically occurring at rest or awakening him from sleep.  Despite this, has been able to walk 6 days a  week x1 mile without symptoms or limitations.  We previously titrated isosorbide for clavicular pain.  With ongoing symptoms, we discussed options for evaluation and agreed to pursue a Lexiscan Myoview to assess for high risk ischemia, which would guide therapy.  Continue aspirin, statin, calcium channel blocker, Plavix, nitrate, beta-blocker.  2.  Chronic HFpEF: EF 55 to 60% by echo in February 2020.  He has some degree of chronic lower extremity swelling which seems to be well managed at this point with regular use of compression socks and Lasix 20 mg once a day.  He does have 1+ bilateral lower extremity edema today, which overall, he he feels is improved.  Though his weight on our scale today is 205, he said he was 195 on his home scale.  He is sometimes in the high 180s to low 190s at home as well.  We agreed that he should take an additional dose of Lasix only for weights greater than 195 pounds.  His heart rate and blood pressure stable on current regimen.  3.  Essential hypertension: Stable on beta-blocker, calcium channel blocker, and long-acting nitrate.  4.  Hyperlipidemia: LDL of 57 with normal LFTs in November 2022.  He remains on statin therapy.  5.  Disposition: Follow-up stress testing as outlined above.  Follow-up in clinic in 4 to 6 weeks or sooner if necessary.  Murray Hodgkins, NP 03/01/2021, 10:47 AM

## 2021-03-02 ENCOUNTER — Telehealth: Payer: Self-pay | Admitting: Internal Medicine

## 2021-03-02 NOTE — Telephone Encounter (Signed)
Spoke with patients daughter per release form. She inquired the need for stress test given previous visits with Dr. Saunders Revel. She stated that given his age they had decided to medically manage and wanted to know if we are not going to pursue further intervention the need for further testing. Advised I would send he inquiry to provider for review and further recommendations. She verbalized understanding with no further questions.

## 2021-03-02 NOTE — Telephone Encounter (Signed)
° °  I called patient's daughter, Aggie Cosier, and discussed rationale for stress testing in the setting of intermittent rest and exertional subclavicular pain, which might represent an anginal equivalent in the setting of multivessel coronary artery disease.  Stress testing to be used to risk stratify and guide therapy, given multiple potential targets for ischemia and intervention.  Caller verbalized understanding and was grateful for the call back.  Murray Hodgkins, NP 03/02/2021, 1:03 PM

## 2021-03-02 NOTE — Telephone Encounter (Signed)
Patient daughter Jason Velazquez calling Would like to go over AVS from 01/19 appt since she was unable to attend  Please call to discuss

## 2021-03-07 ENCOUNTER — Ambulatory Visit: Payer: Medicare Other

## 2021-03-08 ENCOUNTER — Encounter: Payer: Self-pay | Admitting: Internal Medicine

## 2021-03-08 ENCOUNTER — Other Ambulatory Visit: Payer: Self-pay

## 2021-03-08 ENCOUNTER — Ambulatory Visit: Payer: Medicare Other | Admitting: Internal Medicine

## 2021-03-08 VITALS — BP 132/75 | HR 70 | Temp 97.6°F | Resp 16 | Wt 196.5 lb

## 2021-03-08 DIAGNOSIS — K219 Gastro-esophageal reflux disease without esophagitis: Secondary | ICD-10-CM | POA: Diagnosis not present

## 2021-03-08 DIAGNOSIS — I5032 Chronic diastolic (congestive) heart failure: Secondary | ICD-10-CM | POA: Diagnosis not present

## 2021-03-08 DIAGNOSIS — I208 Other forms of angina pectoris: Secondary | ICD-10-CM | POA: Diagnosis not present

## 2021-03-08 DIAGNOSIS — I25119 Atherosclerotic heart disease of native coronary artery with unspecified angina pectoris: Secondary | ICD-10-CM

## 2021-03-08 DIAGNOSIS — N401 Enlarged prostate with lower urinary tract symptoms: Secondary | ICD-10-CM

## 2021-03-08 DIAGNOSIS — I700 Atherosclerosis of aorta: Secondary | ICD-10-CM

## 2021-03-08 NOTE — Assessment & Plan Note (Signed)
Recent pain episodes are not clearly angina Has good exercise tolerance In issosorbide 60mg ---also DAPT--- ASA 81, plavix 75 daily Metoprolol 25 also atorvastatin

## 2021-03-08 NOTE — Progress Notes (Signed)
Subjective:    Patient ID: Jason Velazquez, male    DOB: 1928/05/03, 88 y.o.   MRN: [id number]  HPI Visit in assisted living apartment for review of chronic health conditions Reviewed status with Laurence Aly and his recent cardiology visit  Woke up with pain by clavicle on right---to ER (November) He didn't try the nitro Had chest CT--no PE or pneumonia  Just aortic atherosclerosis No recurrent pain "unless I mash it"  No chest pain No SOB Continues to walk 1 mile every morning---stamina is good Not able to do jumping jacks like he has in the past Some edema--stable in past year Still on diuretic Sleeps in bed with 4 inch block. No PND No palpitations No dizziness or syncope  Ongoing trouble voiding--still with split stream at times Nocturia x 1 usually  Remains independent other than assistance with meds  Current Outpatient Medications on File Prior to Visit  Medication Sig Dispense Refill   amLODipine (NORVASC) 5 MG tablet Take 1 tablet (5 mg total) by mouth daily. 90 tablet 3   aspirin EC 81 MG tablet Take 81 mg by mouth daily. Swallow whole.     atorvastatin (LIPITOR) 80 MG tablet TAKE 1 TABLET BY MOUTH EVERYDAY AT BEDTIME 90 tablet 2   clopidogrel (PLAVIX) 75 MG tablet TAKE 1 TABLET BY MOUTH EVERY DAY 90 tablet 3   cyanocobalamin (,VITAMIN B-12,) 1000 MCG/ML injection Inject 1,000 mcg into the muscle every 30 (thirty) days.     furosemide (LASIX) 20 MG tablet Take 1 tablet (20 mg total) by mouth daily. 30 tablet 5   isosorbide mononitrate (IMDUR) 60 MG 24 hr tablet Take 1 tablet (60 mg total) by mouth daily. 90 tablet 0   ketoconazole (NIZORAL) 2 % cream Apply 1 application topically 2 (two) times daily. 60 g 1   metoprolol succinate (TOPROL-XL) 25 MG 24 hr tablet TAKE 1 TABLET (25 MG TOTAL) BY MOUTH DAILY. TAKE WITH OR IMMEDIATELY FOLLOWING A MEAL. 90 tablet 3   nitroGLYCERIN (NITROSTAT) 0.4 MG SL tablet PLACE 1 TABLET (0.4 MG TOTAL) UNDER THE TONGUE EVERY 5 (FIVE) MINUTES  AS NEEDED FOR CHEST PAIN. 25 tablet 5   pantoprazole (PROTONIX) 40 MG tablet TAKE 1 TABLET BY MOUTH EVERY DAY 90 tablet 3   polyethylene glycol (MIRALAX / GLYCOLAX) packet Take 17 g by mouth daily.     tamsulosin (FLOMAX) 0.4 MG CAPS capsule Take 0.4 mg by mouth daily.     No current facility-administered medications on file prior to visit.    Allergies  Allergen Reactions   Tape Rash    ONLY USE PAPER TAPE ONLY USE PAPER TAPE   Iodine Rash    Rash when applied to skin   Niacin And Related Hives and Other (See Comments)    FLUSHING itching    Past Medical History:  Diagnosis Date   Allergic rhinitis    Benign prostatic hypertrophy    CAD (coronary artery disease)    a. s/p CABG x 4 (1998; LIMA-LAD, SVG-D1-D2, SVG-rPL); b. 2000 & 2006 s/p PCI of the LCX/OM2; c. 03/2018 Cath: LM 25d, LAD 100ost/m, 74m/d, 1st septal fills via RPDA, D2 100, fills via collats from D3, LCX 90ost/m ISR w/ evidence of stent fracture, 19m, OM2 30 ISR, 80/80, RCA 70p, RPDA 60ost, 70, RPAV 100, VG->D1->D2 100p (culprit), LIMA->LAD nl, VG->RPL2 nl. EF 45%-->Med Rx.   Duodenal diverticulum 02/11/2001   GERD (gastroesophageal reflux disease)    H/O hiatal hernia 02/11/2001   Heart murmur  a. 03/2018 Echo: EF 55-60%, DD, mild anterolateral HK.  Mild-mod TR. Mild Ca2+ of AoV and MV.   Hepatic lesion 02/11/2002   History of melanoma excision    FACE   Hypercholesteremia    Hypertension    Left carotid stenosis    > 50%  PER DUPLEX  03-28-2011   Mild mitral regurgitation    Perianal fistula    S/P CABG x 4    1988   S/P drug eluting coronary stent placement    POST CABG--  STENTING 2000  AND RESTENTING 2006 FOR IN-STENT STENOSIS   Stroke [facility])     Past Surgical History:  Procedure Laterality Date   CATARACT EXTRACTION W/ INTRAOCULAR LENS  IMPLANT, BILATERAL     CORONARY ANGIOPLASTY WITH STENT PLACEMENT  2000   PCI AND STENTING CIRCUMFLEX   CORONARY ANGIOPLASTY WITH STENT PLACEMENT  04-30-2004  DR  Daneen Schick   RE-INSTENT STENOSIS/  DRUG-ELUTING STENT OF PROXIMAL AND MID CIRCUMFLEX/ PCI MID [place] STRUT/  WIDELY PATENT SAPHENOUS VEIN GRAFT AND  LIMA TO LAD GRAFT/ TOTAL OCCLUSION RIGHT CORONARY BEYOND THE POSTERIOR DESCENDING ARTERY BRANCH WITH 50% OSTIAL NARROWING/ TOTAL OCCLUSION OF LAD AT THE OSTIUM OF THE LEFT MAIN   CORONARY ARTERY BYPASS GRAFT  1988   X5   EVALUATION UNDER ANESTHESIA WITH ANAL FISTULECTOMY N/A 07/22/2012   Procedure: EXAM UNDER ANESTHESIA WITH ANAL fistulotomy;  Surgeon: Leighton Ruff, MD;  Location: [place];  Service: General;  Laterality: N/A;   LEFT HEART CATH AND CORS/GRAFTS ANGIOGRAPHY N/A 03/31/2018   Procedure: LEFT HEART CATH AND CORS/GRAFTS ANGIOGRAPHY;  Surgeon: Nelva Bush, MD;  Location: [place] [facility];  Service: Cardiovascular;  Laterality: N/A;   SHOULDER ARTHROSCOPY WITH OPEN ROTATOR CUFF REPAIR Right 07-10-1999   TONSILLECTOMY AND ADENOIDECTOMY  1944   TRANSTHORACIC ECHOCARDIOGRAM  03-01-2011  DR Daneen Schick   NORMAL LVF AND LV SIZE/ MILD LEFT ATRIAL ENLARGEMENT/ GRADE II DIASTOLIC DYSFUNCTION WITH ELEVATED LEFT ATRIAL PRESSURE/ MILD TO MODERATE MR/ MILD TR   TRANSURETHRAL RESECTION OF PROSTATE  04/09/2011   Procedure: TRANSURETHRAL RESECTION OF THE PROSTATE WITH GYRUS INSTRUMENTS;  Surgeon: Malka So, MD;  Location: [facility];  Service: Urology;  Laterality: N/A;    Family History  Problem Relation Age of Onset   Breast cancer Mother    Stroke Father    Colon cancer Brother    Stroke Brother     Social History   Socioeconomic History   Marital status: Widowed    Spouse name: [name]   Number of children: 3   Years of education: B.D.   Highest education level: Not on file  Occupational History   Occupation: Land    Comment: Retired  Tobacco Use   Smoking status: Never   Smokeless tobacco: Never  Vaping Use   Vaping Use: Never used  Substance and Sexual Activity   Alcohol  use: No   Drug use: No   Sexual activity: Not Currently  Other Topics Concern   Not on file  Social History Narrative   Lives alone, at [place].  Has some help with housecleaning but o/w he is independent.     Was married to first wife 56 years, then to second wife 12 years.  Widowed twice.        Retired Theme park manager, [employer]   One son was killed in [place] in 1984   One son and one daughter (she is an Therapist, sports) still alive  Has living will   Daughter is health care POA   Has DNR    No feeding tube if cognitively unaware   Social Determinants of Health   Financial Resource Strain: Not on file  Food Insecurity: Not on file  Transportation Needs: Not on file  Physical Activity: Not on file  Stress: Not on file  Social Connections: Not on file  Intimate Partner Violence: Not on file   Review of Systems Bowels move fine with the miralax No anxiety or depression  Appetite is good----weight up slightly (not fluid though)    Objective:   Physical Exam Constitutional:      Appearance: Normal appearance.  Cardiovascular:     Rate and Rhythm: Normal rate and regular rhythm.     Heart sounds:    No gallop.     Comments: Soft aortic systolic murmur Pulmonary:     Effort: Pulmonary effort is normal.     Breath sounds: Normal breath sounds. No wheezing or rales.  Abdominal:     Palpations: Abdomen is soft.     Tenderness: There is no abdominal tenderness.  Musculoskeletal:     Cervical back: Neck supple.     Comments: Calves are full but without sig edema with support hose  Lymphadenopathy:     Cervical: No cervical adenopathy.  Skin:    Findings: No rash.  Neurological:     Mental Status: He is alert.  Psychiatric:        Mood and Affect: Mood normal.        Behavior: Behavior normal.           Assessment & Plan:

## 2021-03-08 NOTE — Assessment & Plan Note (Signed)
Quiet on pantoprazole 40mg  daily

## 2021-03-08 NOTE — Assessment & Plan Note (Signed)
Compensated on heart meds and furosemide 20mg  daily Slight weight gain doesn't appear to be fluid

## 2021-03-08 NOTE — Assessment & Plan Note (Signed)
Voiding well with tamsulosin 0.4mg  daily

## 2021-03-08 NOTE — Assessment & Plan Note (Signed)
Seen on recent imaging Is on atorvastatin 80mg  daily

## 2021-03-08 NOTE — Assessment & Plan Note (Signed)
" >>  ASSESSMENT AND PLAN FOR CHRONIC DIASTOLIC HEART FAILURE (HCC) WRITTEN ON 03/08/2021 11:59 AM BY Mikle Sternberg I, MD  Compensated on heart meds and furosemide  20mg  daily Slight weight gain doesn't appear to be fluid "

## 2021-03-16 ENCOUNTER — Ambulatory Visit
Admission: RE | Admit: 2021-03-16 | Discharge: 2021-03-16 | Disposition: A | Discharge status: Home / Self Care | Payer: Medicare Other | Source: Ambulatory Visit | Attending: Nurse Practitioner | Admitting: Nurse Practitioner

## 2021-03-16 ENCOUNTER — Other Ambulatory Visit: Payer: Self-pay

## 2021-03-16 DIAGNOSIS — I208 Other forms of angina pectoris: Secondary | ICD-10-CM | POA: Diagnosis not present

## 2021-03-16 LAB — NM MYOCAR MULTI W/SPECT W/WALL MOTION / EF
Estimated workload: 1
Exercise duration (min): 0 min
Exercise duration (sec): 0 s
LV dias vol: 52 mL (ref 62–150)
LV sys vol: 13 mL
MPHR: 64 {beats}/min
Nuc Stress EF: 75 %
Peak HR: 84 {beats}/min
Percent HR: 130 %
Rest HR: 66 {beats}/min — AB
Rest Nuclear Isotope Dose: 11.1 mCi
SDS: 15
SRS: 0
SSS: 20
ST Depression (mm): 0 mm
Stress Nuclear Isotope Dose: 30.6 mCi
TID: 1

## 2021-03-16 MED ORDER — TECHNETIUM TC 99M TETROFOSMIN IV KIT
30.0000 | PACK | Freq: Once | INTRAVENOUS | Status: AC | PRN
  Administered 2021-03-16: 30.6 via INTRAVENOUS

## 2021-03-16 MED ORDER — REGADENOSON 0.4 MG/5ML IV SOLN
0.4000 mg | Freq: Once | INTRAVENOUS | Status: AC
  Administered 2021-03-16: 0.4 mg via INTRAVENOUS

## 2021-03-16 MED ORDER — TECHNETIUM TC 99M TETROFOSMIN IV KIT
10.0000 | PACK | Freq: Once | INTRAVENOUS | Status: AC | PRN
  Administered 2021-03-16: 11.1 via INTRAVENOUS

## 2021-03-19 ENCOUNTER — Telehealth: Payer: Self-pay | Admitting: *Deleted

## 2021-03-19 NOTE — Telephone Encounter (Signed)
-----   Message from Theora Gianotti, NP sent at 03/19/2021 10:23 AM EST ----- Stress test is notable for several areas of abnormal blood flow with stress, and likely explains some of the symptoms that he's been having recently when walking.  I reviewed his stress test with Dr. Saunders Revel who personally reviewed the study as well as his prior cath, and we recommend ongoing medical therapy, reserving heart cath only for worsening or more severe symptoms.  I'd like him to increase his imdur to 90mg  daily, and keep f/u in March (sooner if he has any worsening of symptoms).

## 2021-03-19 NOTE — Telephone Encounter (Signed)
No answer/Voicemail box is full.  

## 2021-03-21 NOTE — Telephone Encounter (Signed)
Patient daughter calling to discuss results.

## 2021-03-22 MED ORDER — ISOSORBIDE MONONITRATE ER 30 MG PO TB24
30.0000 mg | ORAL_TABLET | Freq: Every day | ORAL | 3 refills | Status: DC
Start: 2021-03-22 — End: 2021-03-29

## 2021-03-22 NOTE — Telephone Encounter (Signed)
Reviewed results and recommendations with patients daughter per release form. Advised of increase in medication with monitoring signs and symptoms. She verbalized understanding, confirmed appointment, and had no further questions at this time.

## 2021-03-28 ENCOUNTER — Telehealth: Payer: Self-pay | Admitting: Internal Medicine

## 2021-03-28 NOTE — Telephone Encounter (Signed)
°*  STAT* If patient is at the pharmacy, call can be transferred to refill team.   1. Which medications need to be refilled? (please list name of each medication and dose if known)  isosorbide mononitrate (IMDUR) 60 MG 24 hr tablet isosorbide mononitrate (IMDUR) 30 MG 24 hr tablet  2. Which pharmacy/location (including street and city if local pharmacy) is medication to be sent to? [place], [place]  3. Do they need a 30 day or 90 day supply?  90 day supply   Debbie with [facility] is requesting to have Rx transferred to the pharmacy listed above.

## 2021-03-29 MED ORDER — ISOSORBIDE MONONITRATE ER 30 MG PO TB24: 30.0000 mg | ORAL_TABLET | Freq: Every day | ORAL | 0 refills | Status: DC

## 2021-03-29 MED ORDER — ISOSORBIDE MONONITRATE ER 60 MG PO TB24: 60.0000 mg | ORAL_TABLET | Freq: Every day | ORAL | 0 refills | Status: DC

## 2021-03-29 NOTE — Telephone Encounter (Signed)
Requested Prescriptions   Signed Prescriptions Disp Refills   isosorbide mononitrate (IMDUR) 30 MG 24 hr tablet 90 tablet 0    Sig: Take 1 tablet (30 mg total) by mouth daily.    Authorizing Provider: Theora Gianotti    Ordering User: Othelia Pulling C   isosorbide mononitrate (IMDUR) 60 MG 24 hr tablet 90 tablet 0    Sig: Take 1 tablet (60 mg total) by mouth daily.    Authorizing Provider: Theora Gianotti    Ordering User: Britt Bottom

## 2021-04-09 DIAGNOSIS — D1801 Hemangioma of skin and subcutaneous tissue: Secondary | ICD-10-CM | POA: Diagnosis not present

## 2021-04-09 DIAGNOSIS — L821 Other seborrheic keratosis: Secondary | ICD-10-CM | POA: Diagnosis not present

## 2021-04-09 DIAGNOSIS — L570 Actinic keratosis: Secondary | ICD-10-CM | POA: Diagnosis not present

## 2021-04-09 DIAGNOSIS — Z85828 Personal history of other malignant neoplasm of skin: Secondary | ICD-10-CM | POA: Diagnosis not present

## 2021-04-09 DIAGNOSIS — Z85820 Personal history of malignant melanoma of skin: Secondary | ICD-10-CM | POA: Diagnosis not present

## 2021-04-24 ENCOUNTER — Other Ambulatory Visit: Payer: Self-pay

## 2021-04-24 ENCOUNTER — Ambulatory Visit (INDEPENDENT_AMBULATORY_CARE_PROVIDER_SITE_OTHER): Payer: Medicare Other | Admitting: Nurse Practitioner

## 2021-04-24 ENCOUNTER — Encounter: Payer: Self-pay | Admitting: Nurse Practitioner

## 2021-04-24 VITALS — BP 130/80 | HR 74 | Ht 64.0 in | Wt 204.0 lb

## 2021-04-24 DIAGNOSIS — E785 Hyperlipidemia, unspecified: Secondary | ICD-10-CM

## 2021-04-24 DIAGNOSIS — I208 Other forms of angina pectoris: Secondary | ICD-10-CM | POA: Diagnosis not present

## 2021-04-24 DIAGNOSIS — I5032 Chronic diastolic (congestive) heart failure: Secondary | ICD-10-CM | POA: Diagnosis not present

## 2021-04-24 DIAGNOSIS — I2510 Atherosclerotic heart disease of native coronary artery without angina pectoris: Secondary | ICD-10-CM

## 2021-04-24 DIAGNOSIS — I10 Essential (primary) hypertension: Secondary | ICD-10-CM

## 2021-04-24 NOTE — Patient Instructions (Signed)
Medication Instructions:  ? ?Your physician recommends that you continue on your current medications as directed. Please refer to the Current Medication list given to you today. ? ?*If you need a refill on your cardiac medications before your next appointment, please call your pharmacy* ? ? ?Lab Work: ? ?None ordered ? ?Testing/Procedures: ? ?None ordered ? ? ?Follow-Up: ?At [facility], you and your health needs are our priority.  As part of our continuing mission to provide you with exceptional heart care, we have created designated Provider Care Teams.  These Care Teams include your primary Cardiologist (physician) and Advanced Practice Providers (APPs -  Physician Assistants and Nurse Practitioners) who all work together to provide you with the care you need, when you need it. ? ?We recommend signing up for the patient portal called "MyChart".  Sign up information is provided on this After Visit Summary.  MyChart is used to connect with patients for Virtual Visits (Telemedicine).  Patients are able to view lab/test results, encounter notes, upcoming appointments, etc.  Non-urgent messages can be sent to your provider as well.   ?To learn more about what you can do with MyChart, go to [email or web address].   ? ?Your next appointment:   ?3 month(s) ? ?The format for your next appointment:   ?In Person ? ?Provider:   ?Nelva Bush, MD ?

## 2021-04-24 NOTE — Progress Notes (Signed)
Office Visit    Patient Name: Jason Velazquez Date of Encounter: 04/24/2021  Primary Care Provider:  Karie Schwalbe, MD Primary Cardiologist:  Yvonne Kendall, MD  Chief Complaint    88 year old male with a history of CAD status post remote CABG, hypertension, hyperlipidemia, and stroke, who presents for follow-up of CAD and chest pain.  Past Medical History    Past Medical History:  Diagnosis Date   Allergic rhinitis    Benign prostatic hypertrophy    CAD (coronary artery disease)    a. 1998 CABG x 4(LIMA-LAD, VG-D1-D2, VG-RPL); b. 2000 & 2006 PCI of the LCX/OM2; c. 03/2018 Cath: LM 25d, LAD 100ost/m, 7m/d, D2 100, fills via collats from D3, LCX 90ost/m ISR w/ evidence of stent fx, 56m, OM2 30 ISR, 80/80, RCA 70p, RPDA 60ost, 70, RPAV 100, VG->D1->D2 100p (culprit), LIMA->LAD nl, VG->RPL2 nl. EF 45%-->Med Rx; d. 03/2021 MV: ant/antsept/apical inf/inflat isch, EF 75%.   Duodenal diverticulum 02/11/2001   GERD (gastroesophageal reflux disease)    H/O hiatal hernia 02/11/2001   Heart murmur    a. 03/2018 Echo: EF 55-60%, DD, mild anterolateral HK.  Mild-mod TR. Mild Ca2+ of AoV and MV.   Hepatic lesion 02/11/2002   History of melanoma excision    FACE   Hypercholesteremia    Hypertension    Left carotid stenosis    > 50%  PER DUPLEX  03-28-2011   Mild mitral regurgitation    Perianal fistula    S/P CABG x 4    1988   S/P drug eluting coronary stent placement    POST CABG--  STENTING 2000  AND RESTENTING 2006 FOR IN-STENT STENOSIS   Stroke [facility])    Past Surgical History:  Procedure Laterality Date   CATARACT EXTRACTION W/ INTRAOCULAR LENS  IMPLANT, BILATERAL     CORONARY ANGIOPLASTY WITH STENT PLACEMENT  2000   PCI AND STENTING CIRCUMFLEX   CORONARY ANGIOPLASTY WITH STENT PLACEMENT  04-30-2004  DR Verdis Prime   RE-INSTENT STENOSIS/  DRUG-ELUTING STENT OF PROXIMAL AND MID CIRCUMFLEX/ PCI MID CIRCUMFLEX THROUGH STENT STRUT/  WIDELY PATENT SAPHENOUS VEIN GRAFT AND  LIMA TO  LAD GRAFT/ TOTAL OCCLUSION RIGHT CORONARY BEYOND THE POSTERIOR DESCENDING ARTERY BRANCH WITH 50% OSTIAL NARROWING/ TOTAL OCCLUSION OF LAD AT THE OSTIUM OF THE LEFT MAIN   CORONARY ARTERY BYPASS GRAFT  1988   X5   EVALUATION UNDER ANESTHESIA WITH ANAL FISTULECTOMY N/A 07/22/2012   Procedure: EXAM UNDER ANESTHESIA WITH ANAL fistulotomy;  Surgeon: Romie Levee, MD;  Location: [facility];  Service: General;  Laterality: N/A;   LEFT HEART CATH AND CORS/GRAFTS ANGIOGRAPHY N/A 03/31/2018   Procedure: LEFT HEART CATH AND CORS/GRAFTS ANGIOGRAPHY;  Surgeon: Yvonne Kendall, MD;  Location: [facility] INVASIVE CV LAB;  Service: Cardiovascular;  Laterality: N/A;   SHOULDER ARTHROSCOPY WITH OPEN ROTATOR CUFF REPAIR Right 07-10-1999   TONSILLECTOMY AND ADENOIDECTOMY  1944   TRANSTHORACIC ECHOCARDIOGRAM  03-01-2011  DR Verdis Prime   NORMAL LVF AND LV SIZE/ MILD LEFT ATRIAL ENLARGEMENT/ GRADE II DIASTOLIC DYSFUNCTION WITH ELEVATED LEFT ATRIAL PRESSURE/ MILD TO MODERATE MR/ MILD TR   TRANSURETHRAL RESECTION OF PROSTATE  04/09/2011   Procedure: TRANSURETHRAL RESECTION OF THE PROSTATE WITH GYRUS INSTRUMENTS;  Surgeon: Anner Crete, MD;  Location: [facility];  Service: Urology;  Laterality: N/A;    Allergies  Allergies  Allergen Reactions   Tape Rash    ONLY USE PAPER TAPE ONLY USE PAPER TAPE   Iodine Rash    Rash  when applied to skin   Niacin And Related Hives and Other (See Comments)    FLUSHING itching    History of Present Illness    88 year old male with the above past medical history including CAD status post CABG x4 in 1998, with subsequent stenting of the left circumflex and OM 2 in 2000 and 2006 respectively.  Other history includes hypertension, hyperlipidemia, and stroke.  In February 2020, he was admitted with chest pain underwent diagnostic catheterization revealing 2 of 4 patent grafts, with new finding of occlusion of the sequential vein graft to the diagonal branches.  He also had  in-stent restenosis within the left circumflex and moderate disease in the native LAD and RCA.  Medical therapy was recommended and he has been managed with aspirin, statin, Plavix, beta-blocker, calcium channel blocker, and nitrate therapy.  Echocardiogram in 2020 showed an EF of 55 to 60% with anterolateral hypokinesis, impaired relaxation, and mild to moderate tricuspid regurgitation.  In the fall 2022, he had episodic bilateral clavicular pain and his Imdur dose was increased to 60 mg daily.  He was seen in the emergency department in November 2022 secondary to central, throbbing chest pain that awoke him from sleep but only lasted 2 to 3 minutes.  Work-up was unremarkable and troponins were normal.  At office follow-up on January 19, he reported 2-3 more episodes of clavicular pain, typically occurring at rest or awakening from sleep.  In that setting, we agreed to pursue stress testing to understand his ischemic burden and potentially guide therapy.  This was performed in February 2023, and showed basal to distal anterior, basal anteroseptal, distal/apical inferior, and inferolateral ischemia with normal LV function.  In light of known disease on prior catheterization, we decided to continue medical therapy and his isosorbide dose was increased to 90 mg daily.  Since titrating his isosorbide, patient has continued to walk about an hour 6 days a week without symptoms or limitations.  He has continued to have intermittent clavicular pain, usually on the right side and almost exclusively in the act of laying down in bed.  He notes that if he lays on his right side, it is more likely to occur, and resolves when he lays on his left side.  He has never had this clavicular pain when exerting himself.  He denies dyspnea, palpitations, PND, orthopnea, dizziness, syncope, or early satiety.  He continues to wear compression socks and often notes mild lower extremity edema in the afternoon and evening.  His daughter is  present with him today and we had a long discussion about end-of-life goals.  Patient wishes to be a DNR.  Home Medications    Prior to Admission medications   Medication Sig Start Date End Date Taking? Authorizing Provider  amLODipine (NORVASC) 5 MG tablet Take 1 tablet (5 mg total) by mouth daily. 06/08/20  Yes Lorre Munroe, NP  aspirin EC 81 MG tablet Take 81 mg by mouth daily. Swallow whole.   Yes [provider]  atorvastatin (LIPITOR) 80 MG tablet TAKE 1 TABLET BY MOUTH EVERYDAY AT BEDTIME 04/24/20  Yes Tillman Abide I, MD  clopidogrel (PLAVIX) 75 MG tablet TAKE 1 TABLET BY MOUTH EVERY DAY 03/29/19  Yes Joaquim Nam, MD  cyanocobalamin (,VITAMIN B-12,) 1000 MCG/ML injection Inject 1,000 mcg into the muscle every 30 (thirty) days.   Yes [provider]  furosemide (LASIX) 20 MG tablet Take 1 tablet (20 mg total) by mouth daily. 09/26/20  Yes Marisue Ivan D,  PA-C  isosorbide mononitrate (IMDUR) 30 MG 24 hr tablet Take 1 tablet (30 mg total) by mouth daily. 03/29/21 06/27/21 Yes Creig Hines, NP  isosorbide mononitrate (IMDUR) 60 MG 24 hr tablet Take 1 tablet (60 mg total) by mouth daily. 03/29/21  Yes Creig Hines, NP  ketoconazole (NIZORAL) 2 % cream Apply 1 application topically 2 (two) times daily. 04/05/16  Yes Tillman Abide I, MD  metoprolol succinate (TOPROL-XL) 25 MG 24 hr tablet TAKE 1 TABLET (25 MG TOTAL) BY MOUTH DAILY. TAKE WITH OR IMMEDIATELY FOLLOWING A MEAL. 07/22/19  Yes Tillman Abide I, MD  nitroGLYCERIN (NITROSTAT) 0.4 MG SL tablet PLACE 1 TABLET (0.4 MG TOTAL) UNDER THE TONGUE EVERY 5 (FIVE) MINUTES AS NEEDED FOR CHEST PAIN. 03/29/19  Yes Joaquim Nam, MD  pantoprazole (PROTONIX) 40 MG tablet TAKE 1 TABLET BY MOUTH EVERY DAY 12/06/19  Yes Karie Schwalbe, MD  polyethylene glycol (MIRALAX / GLYCOLAX) packet Take 17 g by mouth daily.   Yes [provider]  tamsulosin (FLOMAX) 0.4 MG CAPS capsule Take 0.4 mg by  mouth daily.   Yes [provider]       Review of Systems    Ongoing clavicular pain as outlined above.  He denies exertional clavicular pain.  No chest pain, dyspnea, palpitations, PND, orthopnea, dizziness, syncope, or early satiety.  He has chronic, dependent lower extremity edema.  All other systems reviewed and are otherwise negative except as noted above.    Physical Exam    VS:  BP 130/80 (BP Location: Left Arm, Patient Position: Sitting, Cuff Size: Large)   Pulse 74   Ht 5\' 4"  (1.626 m)   Wt 204 lb (92.5 kg)   SpO2 98%   BMI 35.02 kg/m  , BMI Body mass index is 35.02 kg/m.     GEN: Well nourished, well developed, in no acute distress. HEENT: normal. Neck: Supple, no JVD, carotid bruits, or masses. Cardiac: RRR, no murmurs, rubs, or gallops. No clubbing, cyanosis, 1+ bilateral lower extremity edema.  Radials 2+/PT 1+ and equal bilaterally.  Respiratory:  Respirations regular and unlabored, clear to auscultation bilaterally. GI: Soft, nontender, nondistended, BS + x 4. MS: no deformity or atrophy. Skin: warm and dry, no rash. Neuro:  Strength and sensation are intact. Psych: Normal affect.  Accessory Clinical Findings     Lab Results  Component Value Date   WBC 6.8 12/18/2020   HGB 12.5 (L) 12/18/2020   HCT 37.3 (L) 12/18/2020   MCV 91.0 12/18/2020   PLT 222 12/18/2020   Lab Results  Component Value Date   CREATININE 1.03 12/18/2020   BUN 12 12/18/2020   NA 144 12/18/2020   K 4.0 12/18/2020   CL 106 12/18/2020   CO2 26 12/18/2020   Lab Results  Component Value Date   ALT 17 09/26/2020   AST 23 09/26/2020   ALKPHOS 79 09/26/2020   BILITOT 0.8 09/26/2020   Lab Results  Component Value Date   CHOL 137 03/31/2018   HDL 42 03/31/2018   LDLCALC 82 03/31/2018   TRIG 64 03/31/2018   CHOLHDL 3.3 03/31/2018    Lab Results  Component Value Date   HGBA1C 5.8 (H) 05/27/2013    Assessment & Plan    1.  Coronary artery disease: Status post  CABG x3 in 1998 with subsequent circumflex and OM stenting.  Diagnostic catheterization in February 2020 revealed severe multivessel disease with occluded sequential vein graft to the diagonal branches, as well as  significant in-stent restenosis throughout the left circumflex, proximal RCA disease, and distal LAD disease, beyond the anastomosis of the LIMA.  In the setting of intermittent chest discomfort and clavicular pain, he underwent stress testing to help guide therapy and this showed anterior, anteroseptal, apical inferior, and inferolateral ischemia with normal LV function.  Given known multivessel and distal vessel disease, continue medical therapy was recommended.  He continues to walk for about an hour 6 days a week without symptoms or limitations.  He continues to experience intermittent clavicular pain, almost exclusively when getting into bed at night, and this improves with turning over on his left side.  It is unclear if his clavicular pain was ever anginal in nature though it is clear based on stress testing, that he is prone to ischemia.  Long discussion with patient and his daughter today, reviewing previous catheterization films and recent stress testing data.  We will continue current doses of aspirin, statin, Plavix, nitrate, and beta-blocker, reserving further titration of nitrate therapy for development of exertional symptoms.  2.  Chronic HFpEF: EF 55 to 60% by echo in February 2020.  His EF was 75% by SPECT in February.  He has chronic lower extremity swelling, which she notes worsens throughout the day and he manages with compression socks.  He is also taking Lasix 20 mg daily.  His weight has been stable at about 200 pounds on his home scale.  Heart rate and blood pressure stable.  3.  Essential hypertension: Stable on beta-blocker, calcium channel blocker, and long-acting nitrate.  4.  Hyperlipidemia: LDL of 57 with normal LFTs in November 2022.  Continue statin therapy.  5.   End-of-life goals: Patient's daughter broached the topic of end-of-life goals, in light of patient's known history of CAD and recent abnormal stress test.  Patient stated his preference is to be a DNR.  I did sign a DNR order form for him today and provided it to him to bring back to his assisted living.  6.  Disposition: Follow-up in 3 months or sooner if necessary.  Nicolasa Ducking, NP 04/24/2021, 10:43 AM

## 2021-05-23 DIAGNOSIS — L603 Nail dystrophy: Secondary | ICD-10-CM | POA: Diagnosis not present

## 2021-05-23 DIAGNOSIS — I7091 Generalized atherosclerosis: Secondary | ICD-10-CM | POA: Diagnosis not present

## 2021-05-23 DIAGNOSIS — B351 Tinea unguium: Secondary | ICD-10-CM | POA: Diagnosis not present

## 2021-05-23 DIAGNOSIS — R600 Localized edema: Secondary | ICD-10-CM | POA: Diagnosis not present

## 2021-06-08 ENCOUNTER — Non-Acute Institutional Stay: Payer: Medicare Other | Admitting: Internal Medicine

## 2021-06-08 ENCOUNTER — Encounter: Payer: Self-pay | Admitting: Internal Medicine

## 2021-06-08 DIAGNOSIS — I10 Essential (primary) hypertension: Secondary | ICD-10-CM

## 2021-06-08 DIAGNOSIS — N401 Enlarged prostate with lower urinary tract symptoms: Secondary | ICD-10-CM

## 2021-06-08 DIAGNOSIS — I700 Atherosclerosis of aorta: Secondary | ICD-10-CM | POA: Diagnosis not present

## 2021-06-08 DIAGNOSIS — K219 Gastro-esophageal reflux disease without esophagitis: Secondary | ICD-10-CM | POA: Diagnosis not present

## 2021-06-08 DIAGNOSIS — I255 Ischemic cardiomyopathy: Secondary | ICD-10-CM | POA: Diagnosis not present

## 2021-06-08 DIAGNOSIS — I25119 Atherosclerotic heart disease of native coronary artery with unspecified angina pectoris: Secondary | ICD-10-CM

## 2021-06-08 DIAGNOSIS — E785 Hyperlipidemia, unspecified: Secondary | ICD-10-CM | POA: Diagnosis not present

## 2021-06-08 DIAGNOSIS — R351 Nocturia: Secondary | ICD-10-CM

## 2021-06-08 DIAGNOSIS — I739 Peripheral vascular disease, unspecified: Secondary | ICD-10-CM

## 2021-06-08 DIAGNOSIS — I5032 Chronic diastolic (congestive) heart failure: Secondary | ICD-10-CM

## 2021-06-08 DIAGNOSIS — G3184 Mild cognitive impairment, so stated: Secondary | ICD-10-CM

## 2021-06-08 NOTE — Assessment & Plan Note (Signed)
" >>  ASSESSMENT AND PLAN FOR CHRONIC DIASTOLIC HEART FAILURE (HCC) WRITTEN ON 06/08/2021 12:01 PM BY ANTONETTE CORRIGAN W, NP  We will increase Furosemide  to 40 mg daily Encourage elevation We will repeat be met with yearly labs in May "

## 2021-06-08 NOTE — Assessment & Plan Note (Signed)
Continue Atorvastatin

## 2021-06-08 NOTE — Assessment & Plan Note (Signed)
-   Continue Flomax 

## 2021-06-08 NOTE — Assessment & Plan Note (Signed)
Controlled on Amlodipine, Furosemide and Metoprolol ?We will monitor ?

## 2021-06-08 NOTE — Assessment & Plan Note (Signed)
Appropriate for ALF ?

## 2021-06-08 NOTE — Progress Notes (Signed)
? ?Subjective:  ? ? Patient ID: Jason Velazquez, male    DOB: 10-29-1928, 88 y.o.   MRN: [id number] ? ?HPI ? ?Resident seen in ALF [address] ?No new concerns from RN.  Resident concerned about discoloration/rash to his left lower extremity.  He reports this has been there for a month or so.  He reports the area does not itch, burn or hurt.  He has not tried anything OTC for this. ? ?He has been sleeping well.  He is independent with his ADLs.  He walks without a device.  He denies recent falls.  His appetite is good, weight has been stable.  He reports his bowels are moving okay.  He denies urinary incontinence.  He denies chest pain, shortness of breath, or reflux.  He has had some pain in his right collarbone but attributes this to him sleeping on his right side.  He reports his mood has been good lately. ? ?HTN: His BP today is.  He is taking Amlodipine, Furosemide and Metoprolol as prescribed.  ECG from 12/2020 reviewed. ? ?HLD with Ischemic Cardiomyopathy: CAD, Aortic Atherosclerosis: His last LDL was 57, triglycerides 72, 12/2020.  He denies myalgias on Atorvastatin.  He is taking Aspirin, Plavix, Isosorbide and Metoprolol as well.  He does follow with cardiology. ? ?CHF, diastolic: He reports increased lower extremity edema but denies shortness of breath.  He is taking Metoprolol, Amlodipine and Furosemide as prescribed.  Echo from 03/2018 reviewed. ? ?BPH: He denies any issues on his current dose of Flomax. ? ?GERD: He denies breakthrough on Pantoprazole.  There is no upper GI on file. ? ?MCI: He reports stable cognitive and functional needs. ? ? Review of Systems ? ? ?   ?Past Medical History:  ?Diagnosis Date  ? Allergic rhinitis   ? Benign prostatic hypertrophy   ? CAD (coronary artery disease)   ? a. 1998 CABG x 4(LIMA-LAD, VG-D1-D2, VG-RPL); b. 2000 & 2006 PCI of the LCX/OM2; c. 03/2018 Cath: LM 25d, LAD 100ost/m, 80md, D2 100, fills via collats from D3, LCX 90ost/m ISR w/ evidence of stent fx, 874mOM2 30  ISR, 80/80, RCA 70p, RPDA 60ost, 70, RPAV 100, VG->D1->D2 100p (culprit), LIMA->LAD nl, VG->RPL2 nl. EF 45%-->Med Rx; d. 03/2021 MV: ant/antsept/apical inf/inflat isch, EF 75%.  ? Duodenal diverticulum 02/11/2001  ? GERD (gastroesophageal reflux disease)   ? H/O hiatal hernia 02/11/2001  ? Heart murmur   ? a. 03/2018 Echo: EF 55-60%, DD, mild anterolateral HK.  Mild-mod TR. Mild Ca2+ of AoV and MV.  ? Hepatic lesion 02/11/2002  ? History of melanoma excision   ? FACE  ? Hypercholesteremia   ? Hypertension   ? Left carotid stenosis   ? > 50%  PER DUPLEX  03-28-2011  ? Mild mitral regurgitation   ? Perianal fistula   ? S/P CABG x 4   ? 1988  ? S/P drug eluting coronary stent placement   ? POST CABG--  STENTING 2000  AND RESTENTING 2006 FOR IN-STENT STENOSIS  ? [facility] ? ?Current Outpatient Medications  ?Medication Sig Dispense Refill  ? amLODipine (NORVASC) 5 MG tablet Take 1 tablet (5 mg total) by mouth daily. 90 tablet 3  ? aspirin EC 81 MG tablet Take 81 mg by mouth daily. Swallow whole.    ? atorvastatin (LIPITOR) 80 MG tablet TAKE 1 TABLET BY MOUTH EVERYDAY AT BEDTIME 90 tablet 2  ? clopidogrel (PLAVIX) 75 MG tablet TAKE 1 TABLET BY MOUTH EVERY  DAY 90 tablet 3  ? cyanocobalamin (,VITAMIN B-12,) 1000 MCG/ML injection Inject 1,000 mcg into the muscle every 30 (thirty) days.    ? furosemide (LASIX) 20 MG tablet Take 1 tablet (20 mg total) by mouth daily. 30 tablet 5  ? isosorbide mononitrate (IMDUR) 30 MG 24 hr tablet Take 1 tablet (30 mg total) by mouth daily. 90 tablet 0  ? isosorbide mononitrate (IMDUR) 60 MG 24 hr tablet Take 1 tablet (60 mg total) by mouth daily. 90 tablet 0  ? ketoconazole (NIZORAL) 2 % cream Apply 1 application topically 2 (two) times daily. 60 g 1  ? metoprolol succinate (TOPROL-XL) 25 MG 24 hr tablet TAKE 1 TABLET (25 MG TOTAL) BY MOUTH DAILY. TAKE WITH OR IMMEDIATELY FOLLOWING A MEAL. 90 tablet 3  ? nitroGLYCERIN (NITROSTAT) 0.4 MG SL tablet PLACE 1 TABLET (0.4 MG TOTAL) UNDER THE  TONGUE EVERY 5 (FIVE) MINUTES AS NEEDED FOR CHEST PAIN. 25 tablet 5  ? pantoprazole (PROTONIX) 40 MG tablet TAKE 1 TABLET BY MOUTH EVERY DAY 90 tablet 3  ? polyethylene glycol (MIRALAX / GLYCOLAX) packet Take 17 g by mouth daily.    ? tamsulosin (FLOMAX) 0.4 MG CAPS capsule Take 0.4 mg by mouth daily.    ? ?No current facility-administered medications for this visit.  ? ? ?Allergies  ?Allergen Reactions  ? Tape Rash  ?  ONLY USE PAPER TAPE ?ONLY USE PAPER TAPE  ? Iodine Rash  ?  Rash when applied to skin  ? Niacin And Related Hives and Other (See Comments)  ?  FLUSHING ?itching  ? ? ?Family History  ?Problem Relation Age of Onset  ? Breast cancer Mother   ? Stroke Father   ? Colon cancer Brother   ? Stroke Brother   ? ? ?Social History  ? ?Socioeconomic History  ? Marital status: Widowed  ?  Spouse name: [name]  ? Number of children: 3  ? Years of education: B.D.  ? Highest education level: Not on file  ?Occupational History  ? Occupation: Land  ?  Comment: Retired  ?Tobacco Use  ? Smoking status: Never  ? Smokeless tobacco: Never  ?Vaping Use  ? Vaping Use: Never used  ?Substance and Sexual Activity  ? Alcohol use: No  ? Drug use: No  ? Sexual activity: Not Currently  ?Other Topics Concern  ? Not on file  ?Social History Narrative  ? Lives alone, at [facility].  Has some help with housecleaning but o/w he is independent.    ? Was married to first wife 20 years, then to second wife 12 years.  Widowed twice.    ?   ? Retired Theme park manager, [employer]  ? One son was killed in [place] in 1984  ? One son and one daughter (she is an Therapist, sports) still alive    ?   ? Has living will  ? Daughter is health care POA  ? Has DNR   ? No feeding tube if cognitively unaware  ? ?Social Determinants of Health  ? ?Financial Resource Strain: Not on file  ?Food Insecurity: Not on file  ?Transportation Needs: Not on file  ?Physical Activity: Not on file  ?Stress: Not on file  ?Social Connections: Not on file  ?Intimate  Partner Violence: Not on file  ? ? ? ?Constitutional: Denies fever, malaise, fatigue, headache or abrupt weight changes.  ?HEENT: Denies eye pain, eye redness, ear pain, ringing in the ears, wax buildup, runny nose, nasal  congestion, bloody nose, or sore throat. ?Respiratory: Denies difficulty breathing, shortness of breath, cough or sputum production.   ?Cardiovascular: Patient reports swelling in his lower extremities.  Denies chest pain, chest tightness, palpitations or swelling in the hands or feet.  ?Gastrointestinal: Denies abdominal pain, bloating, constipation, diarrhea or blood in the stool.  ?GU: Denies urgency, frequency, pain with urination, burning sensation, blood in urine, odor or discharge. ?Musculoskeletal: Patient reports pain in his collarbone.  Denies decrease in range of motion, difficulty with gait, muscle pain or joint swelling.  ?Skin: Patient reports lower extremity redness/rash.  Denies lesions or ulcercations.  ?Neurological: Denies dizziness, difficulty with memory, difficulty with speech or problems with balance and coordination.  ?Psych: Denies anxiety, depression, SI/HI. ? ?No other specific complaints in a complete review of systems (except as listed in HPI above). ? ?Objective:  ? Physical Exam ? ?BP 123/70   Pulse 67   Temp (!) 97.2 ?F (36.2 ?C)   Resp 18   Wt 202 lb (91.6 kg)   SpO2 98%   BMI 34.67 kg/m?  ? ?Wt Readings from Last 3 Encounters:  ?04/24/21 204 lb (92.5 kg)  ?03/08/21 196 lb 8 oz (89.1 kg)  ?03/01/21 205 lb (93 kg)  ? ? ?General: Appears his stated age, obese, in NAD. ?Skin: Warm, dry and intact.  Redness with scaly rash noted of LLE, consistent with PVD without ulceration. ?HEENT: Head: normal shape and size; Eyes: sclera white and EOMs intact;  ?Cardiovascular: Normal rate and rhythm. S1,S2 noted.  No murmur, rubs or gallops noted.  1+ BLE edema.  ?Pulmonary/Chest: Normal effort and positive vesicular breath sounds. No respiratory distress. No wheezes, rales  or ronchi noted.  ?Abdomen: Soft and nontender. Normal bowel sounds.  ?Musculoskeletal: No difficulty with gait.  ?Neurological: Alert and oriented. ?Psychiatric: Mood and affect normal.  ? ?BMET ?   ?Component

## 2021-06-08 NOTE — Assessment & Plan Note (Signed)
Continue Atorvastatin, Aspirin, Plavix, Metoprolol and Isosorbide ?Has your labs scheduled for May ?

## 2021-06-08 NOTE — Assessment & Plan Note (Signed)
Continue Atorvastatin, Aspirin, Plavix, Metoprolol ?Has your labs scheduled for May ?

## 2021-06-08 NOTE — Assessment & Plan Note (Signed)
No ulcerations ?Continue Atorvastatin and ASA ?Will monitor ?

## 2021-06-08 NOTE — Assessment & Plan Note (Signed)
We will increase Furosemide to 40 mg daily ?Encourage elevation ?We will repeat be met with yearly labs in May ?

## 2021-06-08 NOTE — Assessment & Plan Note (Signed)
Continue Pantoprazole ?Consider wean to 20 mg at next routine ?

## 2021-06-21 ENCOUNTER — Encounter: Payer: Self-pay | Admitting: Internal Medicine

## 2021-06-21 ENCOUNTER — Ambulatory Visit: Payer: Medicare Other | Admitting: Internal Medicine

## 2021-06-21 DIAGNOSIS — I2510 Atherosclerotic heart disease of native coronary artery without angina pectoris: Secondary | ICD-10-CM | POA: Diagnosis not present

## 2021-06-21 DIAGNOSIS — I872 Venous insufficiency (chronic) (peripheral): Secondary | ICD-10-CM | POA: Diagnosis not present

## 2021-06-21 DIAGNOSIS — R21 Rash and other nonspecific skin eruption: Secondary | ICD-10-CM | POA: Diagnosis not present

## 2021-06-21 DIAGNOSIS — N401 Enlarged prostate with lower urinary tract symptoms: Secondary | ICD-10-CM | POA: Diagnosis not present

## 2021-06-21 DIAGNOSIS — K219 Gastro-esophageal reflux disease without esophagitis: Secondary | ICD-10-CM | POA: Diagnosis not present

## 2021-06-21 DIAGNOSIS — E785 Hyperlipidemia, unspecified: Secondary | ICD-10-CM | POA: Diagnosis not present

## 2021-06-21 DIAGNOSIS — I255 Ischemic cardiomyopathy: Secondary | ICD-10-CM

## 2021-06-21 DIAGNOSIS — I10 Essential (primary) hypertension: Secondary | ICD-10-CM | POA: Diagnosis not present

## 2021-06-21 DIAGNOSIS — E538 Deficiency of other specified B group vitamins: Secondary | ICD-10-CM | POA: Diagnosis not present

## 2021-06-21 MED ORDER — TRIAMCINOLONE ACETONIDE 0.1 % EX CREA: 1.0000 "application " | TOPICAL_CREAM | Freq: Two times a day (BID) | CUTANEOUS | 1 refills | Status: DC | PRN

## 2021-06-21 NOTE — Progress Notes (Signed)
? ?Subjective:  ? ? Patient ID: Jason Velazquez, male    DOB: [birth year], 88 y.o.   MRN: [id number] ? ?HPI ?Visit in assisted living apartment due to rash ?Reviewed status with Hardie Lora and other facility staff ? ?Staff noted rash on both forearms---thought to possibly be related ot sun exposure at baseball game he went to yesterday. ?He feels the rash started as long as 2 weeks ago---but didn't tell anyone ?No itching ? ?Also has rash on left lower calf as well ?He feels they were similar in duration ? ?No new medications or skin products ? ?Current Outpatient Medications on File Prior to Visit  ?Medication Sig Dispense Refill  ? amLODipine (NORVASC) 5 MG tablet Take 1 tablet (5 mg total) by mouth daily. 90 tablet 3  ? aspirin EC 81 MG tablet Take 81 mg by mouth daily. Swallow whole.    ? atorvastatin (LIPITOR) 80 MG tablet TAKE 1 TABLET BY MOUTH EVERYDAY AT BEDTIME 90 tablet 2  ? clopidogrel (PLAVIX) 75 MG tablet TAKE 1 TABLET BY MOUTH EVERY DAY 90 tablet 3  ? cyanocobalamin (,VITAMIN B-12,) 1000 MCG/ML injection Inject 1,000 mcg into the muscle every 30 (thirty) days.    ? furosemide (LASIX) 20 MG tablet Take 1 tablet (20 mg total) by mouth daily. 30 tablet 5  ? isosorbide mononitrate (IMDUR) 30 MG 24 hr tablet Take 1 tablet (30 mg total) by mouth daily. 90 tablet 0  ? isosorbide mononitrate (IMDUR) 60 MG 24 hr tablet Take 1 tablet (60 mg total) by mouth daily. 90 tablet 0  ? ketoconazole (NIZORAL) 2 % cream Apply 1 application topically 2 (two) times daily. 60 g 1  ? metoprolol succinate (TOPROL-XL) 25 MG 24 hr tablet TAKE 1 TABLET (25 MG TOTAL) BY MOUTH DAILY. TAKE WITH OR IMMEDIATELY FOLLOWING A MEAL. 90 tablet 3  ? nitroGLYCERIN (NITROSTAT) 0.4 MG SL tablet PLACE 1 TABLET (0.4 MG TOTAL) UNDER THE TONGUE EVERY 5 (FIVE) MINUTES AS NEEDED FOR CHEST PAIN. 25 tablet 5  ? pantoprazole (PROTONIX) 40 MG tablet TAKE 1 TABLET BY MOUTH EVERY DAY 90 tablet 3  ? polyethylene glycol (MIRALAX / GLYCOLAX) packet Take 17 g by  mouth daily.    ? tamsulosin (FLOMAX) 0.4 MG CAPS capsule Take 0.4 mg by mouth daily.    ? ?No current facility-administered medications on file prior to visit.  ? ? ?Allergies  ?Allergen Reactions  ? Tape Rash  ?  ONLY USE PAPER TAPE ?ONLY USE PAPER TAPE  ? Iodine Rash  ?  Rash when applied to skin  ? Niacin And Related Hives and Other (See Comments)  ?  FLUSHING ?itching  ? ? ?Past Medical History:  ?Diagnosis Date  ? Allergic rhinitis   ? Benign prostatic hypertrophy   ? CAD (coronary artery disease)   ? a. 1998 CABG x 4(LIMA-LAD, VG-D1-D2, VG-RPL); b. 2000 & 2006 PCI of the LCX/OM2; c. 03/2018 Cath: LM 25d, LAD 100ost/m, 54md, D2 100, fills via collats from D3, LCX 90ost/m ISR w/ evidence of stent fx, 839mOM2 30 ISR, 80/80, RCA 70p, RPDA 60ost, 70, RPAV 100, VG->D1->D2 100p (culprit), LIMA->LAD nl, VG->RPL2 nl. EF 45%-->Med Rx; d. 03/2021 MV: ant/antsept/apical inf/inflat isch, EF 75%.  ? Duodenal diverticulum 02/11/2001  ? GERD (gastroesophageal reflux disease)   ? H/O hiatal hernia 02/11/2001  ? Heart murmur   ? a. 03/2018 Echo: EF 55-60%, DD, mild anterolateral HK.  Mild-mod TR. Mild Ca2+ of AoV and MV.  ? Hepatic lesion 02/11/2002  ?  History of melanoma excision   ? FACE  ? Hypercholesteremia   ? Hypertension   ? Left carotid stenosis   ? > 50%  PER DUPLEX  03-28-2011  ? Mild mitral regurgitation   ? Perianal fistula   ? S/P CABG x 4   ? 1988  ? S/P drug eluting coronary stent placement   ? POST CABG--  STENTING 2000  AND RESTENTING 2006 FOR IN-STENT STENOSIS  ? Stroke [facility])   ? ? ?Past Surgical History:  ?Procedure Laterality Date  ? CATARACT EXTRACTION W/ INTRAOCULAR LENS  IMPLANT, BILATERAL    ? CORONARY ANGIOPLASTY WITH STENT PLACEMENT  2000  ? PCI AND STENTING CIRCUMFLEX  ? CORONARY ANGIOPLASTY WITH STENT PLACEMENT  04-30-2004  DR Daneen Schick  ? RE-INSTENT STENOSIS/  DRUG-ELUTING STENT OF PROXIMAL AND MID CIRCUMFLEX/ PCI MID CIRCUMFLEX THROUGH STENT STRUT/  WIDELY PATENT SAPHENOUS VEIN GRAFT AND  LIMA TO  LAD GRAFT/ TOTAL OCCLUSION RIGHT CORONARY BEYOND THE POSTERIOR DESCENDING ARTERY BRANCH WITH 50% OSTIAL NARROWING/ TOTAL OCCLUSION OF LAD AT THE OSTIUM OF THE LEFT MAIN  ? CORONARY ARTERY BYPASS GRAFT  1988  ? X5  ? EVALUATION UNDER ANESTHESIA WITH ANAL FISTULECTOMY N/A 07/22/2012  ? Procedure: EXAM UNDER ANESTHESIA WITH ANAL fistulotomy;  Surgeon: Leighton Ruff, MD;  Location: [facility];  Service: General;  Laterality: N/A;  ? LEFT HEART CATH AND CORS/GRAFTS ANGIOGRAPHY N/A 03/31/2018  ? Procedure: LEFT HEART CATH AND CORS/GRAFTS ANGIOGRAPHY;  Surgeon: Nelva Bush, MD;  Location: [place] [facility];  Service: Cardiovascular;  Laterality: N/A;  ? SHOULDER ARTHROSCOPY WITH OPEN ROTATOR CUFF REPAIR Right 07-10-1999  ? TONSILLECTOMY AND ADENOIDECTOMY  1944  ? TRANSTHORACIC ECHOCARDIOGRAM  03-01-2011  DR Daneen Schick  ? NORMAL LVF AND LV SIZE/ MILD LEFT ATRIAL ENLARGEMENT/ GRADE II DIASTOLIC DYSFUNCTION WITH ELEVATED LEFT ATRIAL PRESSURE/ MILD TO MODERATE MR/ MILD TR  ? TRANSURETHRAL RESECTION OF PROSTATE  04/09/2011  ? Procedure: TRANSURETHRAL RESECTION OF THE PROSTATE WITH GYRUS INSTRUMENTS;  Surgeon: Malka So, MD;  Location: [facility];  Service: Urology;  Laterality: N/A;  ? ? ?Family History  ?Problem Relation Age of Onset  ? Breast cancer Mother   ? Stroke Father   ? Colon cancer Brother   ? Stroke Brother   ? ? ?Social History  ? ?Socioeconomic History  ? Marital status: Widowed  ?  Spouse name: [name]  ? Number of children: 3  ? Years of education: B.D.  ? Highest education level: Not on file  ?Occupational History  ? Occupation: Land  ?  Comment: Retired  ?Tobacco Use  ? Smoking status: Never  ? Smokeless tobacco: Never  ?Vaping Use  ? Vaping Use: Never used  ?Substance and Sexual Activity  ? Alcohol use: No  ? Drug use: No  ? Sexual activity: Not Currently  ?Other Topics Concern  ? Not on file  ?Social History Narrative  ? Lives alone, at [facility].  Has some help with  housecleaning but o/w he is independent.    ? Was married to first wife 74 years, then to second wife 12 years.  Widowed twice.    ?   ? Retired Theme park manager, [employer]  ? One son was killed in [place] in 1984  ? One son and one daughter (she is an Therapist, sports) still alive    ?   ? Has living will  ? Daughter is health care POA  ? Has DNR   ? No  feeding tube if cognitively unaware  ? ?Social Determinants of Health  ? ?Financial Resource Strain: Not on file  ?Food Insecurity: Not on file  ?Transportation Needs: Not on file  ?Physical Activity: Not on file  ?Stress: Not on file  ?Social Connections: Not on file  ?Intimate Partner Violence: Not on file  ? ?Review of Systems ?No fever ?Not sick  ?No history of psoriasis ?   ?Objective:  ? Physical Exam ?Constitutional:   ?   Appearance: Normal appearance.  ?Skin: ?   Comments: Diffuse maculopapular rash on extensor forearms--- Left slightly more than right. No vesicles or inflammation ? ?Psoriaform rash on left lower calf---mostly in front and around sides (but not really posterior)  ?Neurological:  ?   Mental Status: He is alert.  ?  ? ? ? ? ?   ?Assessment & Plan:  ? ?

## 2021-06-21 NOTE — Assessment & Plan Note (Signed)
Arm rash certainly consistent with photosensitivity but the calf looks psoriatic (no prior history) ?Discrepancy in history of duration ?Reassured---no infection and not worrisome ?Will start triamcinolone 0.1% cream bid prn --he can do this himself ?

## 2021-07-10 DIAGNOSIS — Z23 Encounter for immunization: Secondary | ICD-10-CM | POA: Diagnosis not present

## 2021-07-26 ENCOUNTER — Ambulatory Visit: Payer: Medicare Other | Admitting: Internal Medicine

## 2021-07-26 DIAGNOSIS — L603 Nail dystrophy: Secondary | ICD-10-CM | POA: Diagnosis not present

## 2021-07-26 DIAGNOSIS — B351 Tinea unguium: Secondary | ICD-10-CM | POA: Diagnosis not present

## 2021-07-26 DIAGNOSIS — I7091 Generalized atherosclerosis: Secondary | ICD-10-CM | POA: Diagnosis not present

## 2021-08-30 ENCOUNTER — Encounter: Payer: Self-pay | Admitting: Internal Medicine

## 2021-08-30 ENCOUNTER — Ambulatory Visit: Payer: Medicare Other | Admitting: Internal Medicine

## 2021-08-30 DIAGNOSIS — I25119 Atherosclerotic heart disease of native coronary artery with unspecified angina pectoris: Secondary | ICD-10-CM

## 2021-08-30 DIAGNOSIS — I255 Ischemic cardiomyopathy: Secondary | ICD-10-CM | POA: Diagnosis not present

## 2021-08-30 DIAGNOSIS — E785 Hyperlipidemia, unspecified: Secondary | ICD-10-CM | POA: Diagnosis not present

## 2021-08-30 DIAGNOSIS — G3184 Mild cognitive impairment, so stated: Secondary | ICD-10-CM | POA: Diagnosis not present

## 2021-08-30 DIAGNOSIS — I5032 Chronic diastolic (congestive) heart failure: Secondary | ICD-10-CM | POA: Diagnosis not present

## 2021-08-30 DIAGNOSIS — K219 Gastro-esophageal reflux disease without esophagitis: Secondary | ICD-10-CM | POA: Diagnosis not present

## 2021-08-30 DIAGNOSIS — N401 Enlarged prostate with lower urinary tract symptoms: Secondary | ICD-10-CM

## 2021-08-30 NOTE — Assessment & Plan Note (Addendum)
Walks daily No angina on the isosorbide ASA '81mg'$ , clopidogrel 75 and atorvastatin 80

## 2021-08-30 NOTE — Assessment & Plan Note (Signed)
No cognitive decline Maintains ADL independence Doing well in AL

## 2021-08-30 NOTE — Assessment & Plan Note (Signed)
On atorvastatin for secondary prevention---'80mg'$ 

## 2021-08-30 NOTE — Assessment & Plan Note (Signed)
Fluid status better on the increased furosemide--'40mg'$   Isosorbide '90mg'$  and metoprolol 25 daily

## 2021-08-30 NOTE — Assessment & Plan Note (Signed)
Uses prn med only No dysphagia

## 2021-08-30 NOTE — Assessment & Plan Note (Signed)
" >>  ASSESSMENT AND PLAN FOR CHRONIC DIASTOLIC HEART FAILURE (HCC) WRITTEN ON 08/30/2021 11:21 AM BY Jesiah Grismer I, MD  Fluid status better on the increased furosemide --40mg   Isosorbide  90mg  and metoprolol  25 daily "

## 2021-08-30 NOTE — Progress Notes (Signed)
Subjective:    Patient ID: Jason Velazquez, male    DOB: [birth year], 88 y.o.   MRN: [id number]  HPI Visit in assisted living apartment for follow up of chronic health conditions Reviewed status with Collier Salina RN  Has maintained independence with ADLs No incontinence Walks independently No major cognitive changes  Furosemide increased 3 months ago This has gotten him back to prior weight (down 10#) No chest pain----just occasional clavicle pain on right No DOE---tries to walk a mile every morning (according to [employer])---takes 45 minutes Hasn't needed nitro No edema now Sleeps in bed---4 inch block under posts. No PND  Voids okay It "goes everywhere"----and he doesn't like sitting Nocturia x 1  No sig joint pains  Rare heartburn Uses mylanta prn No dysphagia  Current Outpatient Medications on File Prior to Visit  Medication Sig Dispense Refill   amLODipine (NORVASC) 5 MG tablet Take 1 tablet (5 mg total) by mouth daily. 90 tablet 3   aspirin EC 81 MG tablet Take 81 mg by mouth daily. Swallow whole.     atorvastatin (LIPITOR) 80 MG tablet TAKE 1 TABLET BY MOUTH EVERYDAY AT BEDTIME 90 tablet 2   clopidogrel (PLAVIX) 75 MG tablet TAKE 1 TABLET BY MOUTH EVERY DAY 90 tablet 3   cyanocobalamin (,VITAMIN B-12,) 1000 MCG/ML injection Inject 1,000 mcg into the muscle every 30 (thirty) days.     furosemide (LASIX) 40 MG tablet Take 40 mg by mouth daily.     isosorbide mononitrate (IMDUR) 60 MG 24 hr tablet Take 1 tablet (60 mg total) by mouth daily. 90 tablet 0   ketoconazole (NIZORAL) 2 % cream Apply 1 application topically 2 (two) times daily. 60 g 1   metoprolol succinate (TOPROL-XL) 25 MG 24 hr tablet TAKE 1 TABLET (25 MG TOTAL) BY MOUTH DAILY. TAKE WITH OR IMMEDIATELY FOLLOWING A MEAL. 90 tablet 3   nitroGLYCERIN (NITROSTAT) 0.4 MG SL tablet PLACE 1 TABLET (0.4 MG TOTAL) UNDER THE TONGUE EVERY 5 (FIVE) MINUTES AS NEEDED FOR CHEST PAIN. 25 tablet 5   pantoprazole (PROTONIX) 40 MG  tablet TAKE 1 TABLET BY MOUTH EVERY DAY 90 tablet 3   polyethylene glycol (MIRALAX / GLYCOLAX) packet Take 17 g by mouth daily.     tamsulosin (FLOMAX) 0.4 MG CAPS capsule Take 0.4 mg by mouth daily.     triamcinolone cream (KENALOG) 0.1 % Apply 1 application. topically 2 (two) times daily as needed. May keep in room to self-administer 45 g 1   isosorbide mononitrate (IMDUR) 30 MG 24 hr tablet Take 1 tablet (30 mg total) by mouth daily. 90 tablet 0   No current facility-administered medications on file prior to visit.    Allergies  Allergen Reactions   Tape Rash    ONLY USE PAPER TAPE ONLY USE PAPER TAPE   Iodine Rash    Rash when applied to skin   Niacin And Related Hives and Other (See Comments)    FLUSHING itching    Past Medical History:  Diagnosis Date   Allergic rhinitis    Benign prostatic hypertrophy    CAD (coronary artery disease)    a. 1998 CABG x 4(LIMA-LAD, VG-D1-D2, VG-RPL); b. 2000 & 2006 PCI of the LCX/OM2; c. 03/2018 Cath: LM 25d, LAD 100ost/m, 40md, D2 100, fills via collats from D3, LCX 90ost/m ISR w/ evidence of stent fx, 823mOM2 30 ISR, 80/80, RCA 70p, RPDA 60ost, 70, RPAV 100, VG->D1->D2 100p (culprit), LIMA->LAD nl, VG->RPL2 nl. EF 45%-->Med Rx; d. 03/2021 MV:  ant/antsept/apical inf/inflat isch, EF 75%.   Duodenal diverticulum 02/11/2001   GERD (gastroesophageal reflux disease)    H/O hiatal hernia 02/11/2001   Heart murmur    a. 03/2018 Echo: EF 55-60%, DD, mild anterolateral HK.  Mild-mod TR. Mild Ca2+ of AoV and MV.   Hepatic lesion 02/11/2002   History of melanoma excision    FACE   Hypercholesteremia    Hypertension    Left carotid stenosis    > 50%  PER DUPLEX  03-28-2011   Mild mitral regurgitation    Perianal fistula    S/P CABG x 4    1988   S/P drug eluting coronary stent placement    POST CABG--  STENTING 2000  AND RESTENTING 2006 FOR IN-STENT STENOSIS   Stroke [facility])     Past Surgical History:  Procedure Laterality Date   CATARACT  EXTRACTION W/ INTRAOCULAR LENS  IMPLANT, BILATERAL     CORONARY ANGIOPLASTY WITH STENT PLACEMENT  2000   PCI AND STENTING CIRCUMFLEX   CORONARY ANGIOPLASTY WITH STENT PLACEMENT  04-30-2004  DR Daneen Schick   RE-INSTENT STENOSIS/  DRUG-ELUTING STENT OF PROXIMAL AND MID CIRCUMFLEX/ PCI MID [place] STRUT/  WIDELY PATENT SAPHENOUS VEIN GRAFT AND  LIMA TO LAD GRAFT/ TOTAL OCCLUSION RIGHT CORONARY BEYOND THE POSTERIOR DESCENDING ARTERY BRANCH WITH 50% OSTIAL NARROWING/ TOTAL OCCLUSION OF LAD AT THE OSTIUM OF THE LEFT MAIN   CORONARY ARTERY BYPASS GRAFT  1988   X5   EVALUATION UNDER ANESTHESIA WITH ANAL FISTULECTOMY N/A 07/22/2012   Procedure: EXAM UNDER ANESTHESIA WITH ANAL fistulotomy;  Surgeon: Leighton Ruff, MD;  Location: [place];  Service: General;  Laterality: N/A;   LEFT HEART CATH AND CORS/GRAFTS ANGIOGRAPHY N/A 03/31/2018   Procedure: LEFT HEART CATH AND CORS/GRAFTS ANGIOGRAPHY;  Surgeon: Nelva Bush, MD;  Location: [place] [facility];  Service: Cardiovascular;  Laterality: N/A;   SHOULDER ARTHROSCOPY WITH OPEN ROTATOR CUFF REPAIR Right 07-10-1999   TONSILLECTOMY AND ADENOIDECTOMY  1944   TRANSTHORACIC ECHOCARDIOGRAM  03-01-2011  DR Daneen Schick   NORMAL LVF AND LV SIZE/ MILD LEFT ATRIAL ENLARGEMENT/ GRADE II DIASTOLIC DYSFUNCTION WITH ELEVATED LEFT ATRIAL PRESSURE/ MILD TO MODERATE MR/ MILD TR   TRANSURETHRAL RESECTION OF PROSTATE  04/09/2011   Procedure: TRANSURETHRAL RESECTION OF THE PROSTATE WITH GYRUS INSTRUMENTS;  Surgeon: Malka So, MD;  Location: [facility];  Service: Urology;  Laterality: N/A;    Family History  Problem Relation Age of Onset   Breast cancer Mother    Stroke Father    Colon cancer Brother    Stroke Brother     Social History   Socioeconomic History   Marital status: Widowed    Spouse name: [name]   Number of children: 3   Years of education: B.D.   Highest education level: Not on file  Occupational History    Occupation: Land    Comment: Retired  Tobacco Use   Smoking status: Never   Smokeless tobacco: Never  Vaping Use   Vaping Use: Never used  Substance and Sexual Activity   Alcohol use: No   Drug use: No   Sexual activity: Not Currently  Other Topics Concern   Not on file  Social History Narrative   Lives alone, at [facility].  Has some help with housecleaning but o/w he is independent.     Was married to first wife 35 years, then to second wife 12 years.  Widowed twice.  Retired Theme park manager, [employer]   One son was killed in [place] in 1984   One son and one daughter (she is an Therapist, sports) still alive        Has living will   Daughter is health care POA   Has DNR    No feeding tube if cognitively unaware   Social Determinants of Radio broadcast assistant Strain: Not on file  Food Insecurity: Not on file  Transportation Needs: Not on file  Physical Activity: Not on file  Stress: Not on file  Social Connections: Not on file  Intimate Partner Violence: Not on file   Review of Systems Appetite is good Sleeps well Bowels move fine with daily miralax No sig back or joint pains     Objective:   Physical Exam Constitutional:      Appearance: Normal appearance.  Cardiovascular:     Rate and Rhythm: Normal rate and regular rhythm.     Heart sounds: No murmur heard.    No gallop.  Pulmonary:     Effort: Pulmonary effort is normal.     Breath sounds: Normal breath sounds. No wheezing or rales.  Abdominal:     Palpations: Abdomen is soft.     Tenderness: There is no abdominal tenderness.  Musculoskeletal:     Cervical back: Neck supple.     Comments: Calves are full without pitting  Lymphadenopathy:     Cervical: No cervical adenopathy.  Neurological:     Mental Status: He is alert.  Psychiatric:        Mood and Affect: Mood normal.        Behavior: Behavior normal.            Assessment & Plan:

## 2021-08-30 NOTE — Assessment & Plan Note (Signed)
Voids okay on the tamsulosin 0.'4mg'$  daily

## 2021-09-03 DIAGNOSIS — I5032 Chronic diastolic (congestive) heart failure: Secondary | ICD-10-CM | POA: Diagnosis not present

## 2021-09-03 LAB — BASIC METABOLIC PANEL
BUN: 12 (ref 4–21)
CO2: 28 — AB (ref 13–22)
Chloride: 99 (ref 99–108)
Creatinine: 1.2 (ref 0.6–1.3)
Glucose: 97
Potassium: 4 mEq/L (ref 3.5–5.1)
Sodium: 132 — AB (ref 137–147)

## 2021-09-03 LAB — COMPREHENSIVE METABOLIC PANEL
Albumin: 4.1 (ref 3.5–5.0)
Calcium: 9 (ref 8.7–10.7)
eGFR: 60

## 2021-09-03 LAB — HEPATIC FUNCTION PANEL: Alkaline Phosphatase: 3.2 — AB (ref 25–125)

## 2021-10-09 DIAGNOSIS — D0439 Carcinoma in situ of skin of other parts of face: Secondary | ICD-10-CM | POA: Diagnosis not present

## 2021-10-09 DIAGNOSIS — L570 Actinic keratosis: Secondary | ICD-10-CM | POA: Diagnosis not present

## 2021-10-09 DIAGNOSIS — Z85828 Personal history of other malignant neoplasm of skin: Secondary | ICD-10-CM | POA: Diagnosis not present

## 2021-10-09 DIAGNOSIS — D485 Neoplasm of uncertain behavior of skin: Secondary | ICD-10-CM | POA: Diagnosis not present

## 2021-10-09 DIAGNOSIS — Z85820 Personal history of malignant melanoma of skin: Secondary | ICD-10-CM | POA: Diagnosis not present

## 2021-10-09 DIAGNOSIS — L821 Other seborrheic keratosis: Secondary | ICD-10-CM | POA: Diagnosis not present

## 2021-10-09 DIAGNOSIS — D3617 Benign neoplasm of peripheral nerves and autonomic nervous system of trunk, unspecified: Secondary | ICD-10-CM | POA: Diagnosis not present

## 2021-10-11 ENCOUNTER — Encounter: Payer: Self-pay | Admitting: Internal Medicine

## 2021-10-11 ENCOUNTER — Other Ambulatory Visit
Admission: RE | Admit: 2021-10-11 | Discharge: 2021-10-11 | Disposition: A | Discharge status: Home / Self Care | Payer: Medicare Other | Source: Ambulatory Visit | Attending: Internal Medicine | Admitting: Internal Medicine

## 2021-10-11 ENCOUNTER — Ambulatory Visit: Payer: Medicare Other | Attending: Internal Medicine | Admitting: Internal Medicine

## 2021-10-11 VITALS — BP 122/68 | HR 76 | Ht 64.0 in | Wt 208.1 lb

## 2021-10-11 DIAGNOSIS — I5032 Chronic diastolic (congestive) heart failure: Secondary | ICD-10-CM | POA: Diagnosis not present

## 2021-10-11 DIAGNOSIS — I5033 Acute on chronic diastolic (congestive) heart failure: Secondary | ICD-10-CM | POA: Diagnosis not present

## 2021-10-11 DIAGNOSIS — E785 Hyperlipidemia, unspecified: Secondary | ICD-10-CM | POA: Insufficient documentation

## 2021-10-11 DIAGNOSIS — I2510 Atherosclerotic heart disease of native coronary artery without angina pectoris: Secondary | ICD-10-CM

## 2021-10-11 DIAGNOSIS — I10 Essential (primary) hypertension: Secondary | ICD-10-CM | POA: Insufficient documentation

## 2021-10-11 DIAGNOSIS — I25118 Atherosclerotic heart disease of native coronary artery with other forms of angina pectoris: Secondary | ICD-10-CM | POA: Diagnosis not present

## 2021-10-11 LAB — COMPREHENSIVE METABOLIC PANEL
ALT: 17 U/L (ref 0–44)
AST: 20 U/L (ref 15–41)
Albumin: 3.7 g/dL (ref 3.5–5.0)
Alkaline Phosphatase: 90 U/L (ref 38–126)
Anion gap: 8 (ref 5–15)
BUN: 16 mg/dL (ref 8–23)
CO2: 26 mmol/L (ref 22–32)
Calcium: 8.6 mg/dL — ABNORMAL LOW (ref 8.9–10.3)
Chloride: 101 mmol/L (ref 98–111)
Creatinine, Ser: 1.32 mg/dL — ABNORMAL HIGH (ref 0.61–1.24)
GFR, Estimated: 51 mL/min — ABNORMAL LOW (ref >60)
Glucose, Bld: 110 mg/dL — ABNORMAL HIGH (ref 70–99)
Potassium: 3.6 mmol/L (ref 3.5–5.1)
Sodium: 135 mmol/L (ref 135–145)
Total Bilirubin: 0.9 mg/dL (ref 0.3–1.2)
Total Protein: 6.6 g/dL (ref 6.5–8.1)

## 2021-10-11 LAB — CBC
HCT: 34 % — ABNORMAL LOW (ref 39.0–52.0)
Hemoglobin: 11.1 g/dL — ABNORMAL LOW (ref 13.0–17.0)
MCH: 27.9 pg (ref 26.0–34.0)
MCHC: 32.6 g/dL (ref 30.0–36.0)
MCV: 85.4 fL (ref 80.0–100.0)
Platelets: 229 10*3/uL (ref 150–400)
RBC: 3.98 MIL/uL — ABNORMAL LOW (ref 4.22–5.81)
RDW: 14.4 % (ref 11.5–15.5)
WBC: 6.8 10*3/uL (ref 4.0–10.5)
nRBC: 0 % (ref 0.0–0.2)

## 2021-10-11 LAB — LIPID PANEL
Cholesterol: 109 mg/dL (ref 0–200)
HDL: 34 mg/dL — ABNORMAL LOW (ref >40)
LDL Cholesterol: 51 mg/dL (ref 0–99)
Total CHOL/HDL Ratio: 3.2 RATIO
Triglycerides: 118 mg/dL (ref <150)
VLDL: 24 mg/dL (ref 0–40)

## 2021-10-11 MED ORDER — FUROSEMIDE 80 MG PO TABS: 80.0000 mg | ORAL_TABLET | Freq: Every day | ORAL | 0 refills | Status: DC

## 2021-10-11 NOTE — Progress Notes (Signed)
Follow-up Outpatient Visit Date: 10/11/2021  Primary Care Provider: Venia Carbon, MD [place] [address]  Chief Complaint: Leg swelling  HPI:  Jason Velazquez is a 88 y.o. male with history of CAD s/p CABG (1998; LIMA-LAD, SVG-D1-D2, SVG-rPL) and subsequent PCI to proximal LCx, stroke with residual memory deficites, carotid artery stenosis, hypertension, hyperlipidemia, and BPH, who presents for follow-up of coronary artery disease.  He was last seen in our office in March by Ignacia Bayley, NP, he was feeling well.  He had previously undergone uptitration of isosorbide mononitrate and reported improvement in his symptoms.  He continued to note some intermittent clavicular pain, usually on the right side when lying down in bed.  He did not have any exertional symptoms.  Today, [name] is most concerned about leg swelling as well as pain in his chest at the sternoclavicular junctions.  Leg swelling has been present for 12 to 18 months but seems to have gotten worse recently.  His weight is also up a little bit.  He denies shortness of breath, though his daughter feels like he appears short winded when exerting himself.  Mr. Nees continues to have chest pain where his clavicles and sternum meet.  It is present only when he lies on his sides in bed at night.  He is not have any exertional chest pain.  He also denies palpitations, lightheadedness, and syncope/falls.  --------------------------------------------------------------------------------------------------  Past Medical History:  Diagnosis Date   Allergic rhinitis    Benign prostatic hypertrophy    CAD (coronary artery disease)    a. 1998 CABG x 4(LIMA-LAD, VG-D1-D2, VG-RPL); b. 2000 & 2006 PCI of the LCX/OM2; c. 03/2018 Cath: LM 25d, LAD 100ost/m, 20md, D2 100, fills via collats from D3, LCX 90ost/m ISR w/ evidence of stent fx, 863mOM2 30 ISR, 80/80, RCA 70p, RPDA 60ost, 70, RPAV 100, VG->D1->D2 100p (culprit),  LIMA->LAD nl, VG->RPL2 nl. EF 45%-->Med Rx; d. 03/2021 MV: ant/antsept/apical inf/inflat isch, EF 75%.   Duodenal diverticulum 02/11/2001   GERD (gastroesophageal reflux disease)    H/O hiatal hernia 02/11/2001   Heart murmur    a. 03/2018 Echo: EF 55-60%, DD, mild anterolateral HK.  Mild-mod TR. Mild Ca2+ of AoV and MV.   Hepatic lesion 02/11/2002   History of melanoma excision    FACE   Hypercholesteremia    Hypertension    Left carotid stenosis    > 50%  PER DUPLEX  03-28-2011   Mild mitral regurgitation    Perianal fistula    S/P CABG x 4    1988   S/P drug eluting coronary stent placement    POST CABG--  STENTING 2000  AND RESTENTING 2006 FOR IN-STENT STENOSIS   [facility]   Past Surgical History:  Procedure Laterality Date   CATARACT EXTRACTION W/ INTRAOCULAR LENS  IMPLANT, BILATERAL     CORONARY ANGIOPLASTY WITH STENT PLACEMENT  2000   PCI AND STENTING CIRCUMFLEX   CORONARY ANGIOPLASTY WITH STENT PLACEMENT  04-30-2004  DR HEDaneen Schick RE-INSTENT STENOSIS/  DRUG-ELUTING STENT OF PROXIMAL AND MID CIRCUMFLEX/ PCI MID CIRCUMFLEX THROUGH STENT STRUT/  WIDELY PATENT SAPHENOUS VEIN GRAFT AND  LIMA TO LAD GRAFT/ TOTAL OCCLUSION RIGHT CORONARY BEYOND THE POSTERIOR DESCENDING ARTERY BRANCH WITH 50% OSTIAL NARROWING/ TOTAL OCCLUSION OF LAD AT THE OSTIUM OF THE LEFT MAIN   CORONARY ARTERY BYPASS GRAFT  1988   X5   EVALUATION UNDER ANESTHESIA WITH ANAL FISTULECTOMY N/A 07/22/2012  Procedure: EXAM UNDER ANESTHESIA WITH ANAL fistulotomy;  Surgeon: Leighton Ruff, MD;  Location: [facility];  Service: General;  Laterality: N/A;   LEFT HEART CATH AND CORS/GRAFTS ANGIOGRAPHY N/A 03/31/2018   Procedure: LEFT HEART CATH AND CORS/GRAFTS ANGIOGRAPHY;  Surgeon: Nelva Bush, MD;  Location: [place] [facility];  Service: Cardiovascular;  Laterality: N/A;   SHOULDER ARTHROSCOPY WITH OPEN ROTATOR CUFF REPAIR Right 07-10-1999   TONSILLECTOMY AND ADENOIDECTOMY  1944   TRANSTHORACIC  ECHOCARDIOGRAM  03-01-2011  DR Daneen Schick   NORMAL LVF AND LV SIZE/ MILD LEFT ATRIAL ENLARGEMENT/ GRADE II DIASTOLIC DYSFUNCTION WITH ELEVATED LEFT ATRIAL PRESSURE/ MILD TO MODERATE MR/ MILD TR   TRANSURETHRAL RESECTION OF PROSTATE  04/09/2011   Procedure: TRANSURETHRAL RESECTION OF THE PROSTATE WITH GYRUS INSTRUMENTS;  Surgeon: Malka So, MD;  Location: [facility];  Service: Urology;  Laterality: N/A;    Current Meds  Medication Sig   amLODipine (NORVASC) 5 MG tablet Take 1 tablet (5 mg total) by mouth daily.   aspirin EC 81 MG tablet Take 81 mg by mouth daily. Swallow whole.   atorvastatin (LIPITOR) 80 MG tablet TAKE 1 TABLET BY MOUTH EVERYDAY AT BEDTIME   clopidogrel (PLAVIX) 75 MG tablet TAKE 1 TABLET BY MOUTH EVERY DAY   cyanocobalamin (,VITAMIN B-12,) 1000 MCG/ML injection Inject 1,000 mcg into the muscle every 30 (thirty) days.   furosemide (LASIX) 40 MG tablet Take 40 mg by mouth daily.   isosorbide mononitrate (IMDUR) 60 MG 24 hr tablet Take 1 tablet (60 mg total) by mouth daily.   ketoconazole (NIZORAL) 2 % cream Apply 1 application topically 2 (two) times daily.   metoprolol succinate (TOPROL-XL) 25 MG 24 hr tablet TAKE 1 TABLET (25 MG TOTAL) BY MOUTH DAILY. TAKE WITH OR IMMEDIATELY FOLLOWING A MEAL.   nitroGLYCERIN (NITROSTAT) 0.4 MG SL tablet PLACE 1 TABLET (0.4 MG TOTAL) UNDER THE TONGUE EVERY 5 (FIVE) MINUTES AS NEEDED FOR CHEST PAIN.   pantoprazole (PROTONIX) 40 MG tablet TAKE 1 TABLET BY MOUTH EVERY DAY   polyethylene glycol (MIRALAX / GLYCOLAX) packet Take 17 g by mouth daily.   tamsulosin (FLOMAX) 0.4 MG CAPS capsule Take 0.4 mg by mouth daily.   triamcinolone cream (KENALOG) 0.1 % Apply 1 application. topically 2 (two) times daily as needed. May keep in room to self-administer   [DISCONTINUED] isosorbide mononitrate (IMDUR) 30 MG 24 hr tablet Take 1 tablet (30 mg total) by mouth daily.    Allergies: Tape, Iodine, and Niacin and related  Social History   Tobacco Use    Smoking status: Never   Smokeless tobacco: Never  Vaping Use   Vaping Use: Never used  Substance Use Topics   Alcohol use: No   Drug use: No    Family History  Problem Relation Age of Onset   Breast cancer Mother    Stroke Father    Colon cancer Brother    Stroke Brother     Review of Systems: A 12-system review of systems was performed and was negative except as noted in the HPI.  --------------------------------------------------------------------------------------------------  Physical Exam: BP 122/68 (BP Location: Left Arm, Patient Position: Sitting, Cuff Size: Normal)   Pulse 76   Ht '5\' 4"'$  (1.626 m)   Wt 208 lb 2 oz (94.4 kg)   SpO2 95%   BMI 35.72 kg/m   General:  NAD. Neck: JVP difficult to assess due to body habitus. Lungs: Clear to auscultation bilaterally without wheezes or crackles. Heart: Regular rate and rhythm  without murmurs, rubs, or gallops. Abdomen: Firm but nontender. Extremities: 2+ pitting edema to the proximal thighs.  EKG: Normal sinus rhythm without abnormality.  Lab Results  Component Value Date   WBC 6.8 12/18/2020   HGB 12.5 (L) 12/18/2020   HCT 37.3 (L) 12/18/2020   MCV 91.0 12/18/2020   PLT 222 12/18/2020    Lab Results  Component Value Date   NA 144 12/18/2020   K 4.0 12/18/2020   CL 106 12/18/2020   CO2 26 12/18/2020   BUN 12 12/18/2020   CREATININE 1.03 12/18/2020   GLUCOSE 106 (H) 12/18/2020   ALT 17 09/26/2020    Lab Results  Component Value Date   CHOL 137 03/31/2018   HDL 42 03/31/2018   LDLCALC 82 03/31/2018   TRIG 64 03/31/2018   CHOLHDL 3.3 03/31/2018    --------------------------------------------------------------------------------------------------  ASSESSMENT AND PLAN: Acute on chronic HFpEF: I am concerned that progressive leg swelling and weight gain are due to HFpEF.  Mr. Skillman has 2+ pretibial edema on examination today.  I have asked him to increase furosemide to 80 mg daily.  He should continue  minimizing his sodium intake.  I will check a CBC and CMP today.  Based on results, we will need to consider repeating a BMP in 1-2 weeks at [facility].  Most recent echo in 2020 showed preserved LVEF with grade 1 diastolic dysfunction.  MPI in February showed an EF of 75%.  If edema does not resolve promptly with escalation of furosemide, repeat echocardiogram will need to be considered.  Coronary artery disease with stable angina: No anginal chest pain reported though Mr. Peddy continues to complain of pain in along the medial aspects of both clavicles when he lies on his sides at night.  I suspect this is musculoskeletal and is reproduced when I apply pressure over the sternoclavicular joints.  We will plan to continue his current antianginal regimen of amlodipine, isosorbide mononitrate, and metoprolol.  We will also continue with indefinite DAPT as tolerated.  I will check a CBC today.  Hypertension: Blood pressure well controlled today.  Continue current doses of amlodipine, isosorbide mononitrate, and metoprolol.  Hyperlipidemia: Lipid just above goal on last check.  We will repeat a lipid panel today and plan to continue atorvastatin 80 mg daily for target LDL less than 70.  Follow-up: Return to clinic in 4 weeks.  Nelva Bush, MD 10/11/2021 3:01 PM

## 2021-10-11 NOTE — Patient Instructions (Signed)
Medication Instructions:   Your physician has recommended you make the following change in your medication:   INCREASE Lasix 80 mg daily   *If you need a refill on your cardiac medications before your next appointment, please call your pharmacy*   Lab Work:  Today at the medical mall: CBC, CMET, Lipid panel  -  Please go to the [facility] in at the Registration Desk: 1st desk to the right, past the screening table  If you have labs (blood work) drawn today and your tests are completely normal, you will receive your results only by: [place] (if you have MyChart) OR A paper copy in the mail If you have any lab test that is abnormal or we need to change your treatment, we will call you to review the results.   Testing/Procedures:  None ordered   Follow-Up: At [facility], you and your health needs are our priority.  As part of our continuing mission to provide you with exceptional heart care, we have created designated Provider Care Teams.  These Care Teams include your primary Cardiologist (physician) and Advanced Practice Providers (APPs -  Physician Assistants and Nurse Practitioners) who all work together to provide you with the care you need, when you need it.  We recommend signing up for the patient portal called "MyChart".  Sign up information is provided on this After Visit Summary.  MyChart is used to connect with patients for Virtual Visits (Telemedicine).  Patients are able to view lab/test results, encounter notes, upcoming appointments, etc.  Non-urgent messages can be sent to your provider as well.   To learn more about what you can do with MyChart, go to [email or web address].    Your next appointment:   4 week(s)  The format for your next appointment:   In Person  Provider:   You may see Nelva Bush, MD or one of the following Advanced Practice Providers on your designated Care Team:   Murray Hodgkins,  NP Christell Faith, PA-C Cadence Kathlen Mody, PA-C Gerrie Nordmann, NP    Important Information About Sugar

## 2021-10-12 ENCOUNTER — Telehealth: Payer: Self-pay

## 2021-10-12 ENCOUNTER — Encounter: Payer: Self-pay | Admitting: Internal Medicine

## 2021-10-12 DIAGNOSIS — I5032 Chronic diastolic (congestive) heart failure: Secondary | ICD-10-CM | POA: Insufficient documentation

## 2021-10-12 DIAGNOSIS — Z79899 Other long term (current) drug therapy: Secondary | ICD-10-CM

## 2021-10-12 DIAGNOSIS — I5033 Acute on chronic diastolic (congestive) heart failure: Secondary | ICD-10-CM

## 2021-10-12 NOTE — Telephone Encounter (Signed)
Left message for Becky to call back.

## 2021-10-12 NOTE — Telephone Encounter (Signed)
-----   Message from Jason Bush, MD sent at 10/12/2021  6:40 AM EDT ----- Please let Jason Velazquez know that his labs show slight decline in his kidney function and blood counts.  His electrolytes are reasonable though his potassium is low normal.  Cholesterol is adequately controlled.  Given his swelling and weight gain discussed yesterday, I think it is reasonable to increase Lasix to 80 mg daily, as planned.  I suggest that he begin taking potassium chloride 20 mEq daily and that we repeat a basic metabolic panel in 1 week.  This can be drawn through [facility] and faxed to [place] if that is more convenient for Jason Velazquez than coming to the medical mall.

## 2021-10-17 DIAGNOSIS — D3131 Benign neoplasm of right choroid: Secondary | ICD-10-CM | POA: Diagnosis not present

## 2021-10-17 NOTE — Telephone Encounter (Signed)
Left message for Jason Velazquez to call back.

## 2021-10-18 NOTE — Telephone Encounter (Signed)
Pt's daughter is returning call and is requesting call back in regards to result.

## 2021-10-19 MED ORDER — POTASSIUM CHLORIDE CRYS ER 20 MEQ PO TBCR: 20.0000 meq | EXTENDED_RELEASE_TABLET | Freq: Every day | ORAL | 0 refills | Status: DC

## 2021-10-19 MED ORDER — POTASSIUM CHLORIDE CRYS ER 20 MEQ PO TBCR: 20.0000 meq | EXTENDED_RELEASE_TABLET | Freq: Every day | ORAL | 3 refills | Status: DC

## 2021-10-19 NOTE — Telephone Encounter (Signed)
Spoke to nurse Collier Salina at [facility]. New orders discussed with Collier Salina. BMET lab orders and new start Potassium 20 mEq daily order faxed to [facility] @ [phone].

## 2021-10-19 NOTE — Telephone Encounter (Signed)
Spoke with pt's daughter, Jacqlyn Larsen ([place]), notified of lab results and Dr. Darnelle Bos recc.  Becky voiced understanding. Pt will start Potassium 20 mEq daily. Rx sent to pharmacy.   Attempted to call [facility] charge nurse to discuss repeat BMET in 1 week after starting potassium.  Left message on vm to call back. Will fax lab orders once speak with nurse and get fax number.   Jacqlyn Larsen has no further questions at this time.

## 2021-10-19 NOTE — Telephone Encounter (Signed)
[facility] is returning call. Transferred to Solmon Ice, RN

## 2021-10-29 DIAGNOSIS — Z79899 Other long term (current) drug therapy: Secondary | ICD-10-CM | POA: Diagnosis not present

## 2021-10-29 DIAGNOSIS — I5033 Acute on chronic diastolic (congestive) heart failure: Secondary | ICD-10-CM | POA: Diagnosis not present

## 2021-10-30 ENCOUNTER — Encounter: Payer: Self-pay | Admitting: Physician Assistant

## 2021-10-30 DIAGNOSIS — B351 Tinea unguium: Secondary | ICD-10-CM | POA: Diagnosis not present

## 2021-10-30 DIAGNOSIS — I7091 Generalized atherosclerosis: Secondary | ICD-10-CM | POA: Diagnosis not present

## 2021-11-06 ENCOUNTER — Telehealth: Payer: Self-pay

## 2021-11-06 NOTE — Telephone Encounter (Signed)
Called and spoke with patients daughter. She verbalized understanding and agreed with plan.

## 2021-11-06 NOTE — Telephone Encounter (Signed)
-----   Message from Nelva Bush, MD sent at 11/01/2021  7:44 AM EDT ----- Please let Mr. Collister know that I have reviewed his outside labs, which show relatively stable renal function and electrolytes.  His kidney function is still a little worse than baseline.  If his leg swelling has resolved, I think it would be reasonable for him to decrease furosemide back to 40 mg daily.  If he still has leg swelling, he should continue taking 80 mg daily.  In either case, he should follow-up with Christell Faith, PA, as scheduled on 11/12/2021.

## 2021-11-12 ENCOUNTER — Ambulatory Visit: Payer: Medicare Other | Admitting: Physician Assistant

## 2021-11-16 ENCOUNTER — Encounter: Payer: Self-pay | Admitting: Internal Medicine

## 2021-11-16 ENCOUNTER — Other Ambulatory Visit
Admission: RE | Admit: 2021-11-16 | Discharge: 2021-11-16 | Disposition: A | Discharge status: Home / Self Care | Payer: Medicare Other | Source: Ambulatory Visit | Attending: Internal Medicine | Admitting: Internal Medicine

## 2021-11-16 ENCOUNTER — Ambulatory Visit: Payer: Medicare Other | Attending: Physician Assistant | Admitting: Internal Medicine

## 2021-11-16 VITALS — BP 124/70 | HR 77 | Ht 64.0 in | Wt 205.4 lb

## 2021-11-16 DIAGNOSIS — M7989 Other specified soft tissue disorders: Secondary | ICD-10-CM | POA: Insufficient documentation

## 2021-11-16 DIAGNOSIS — I2510 Atherosclerotic heart disease of native coronary artery without angina pectoris: Secondary | ICD-10-CM | POA: Diagnosis not present

## 2021-11-16 DIAGNOSIS — Z79899 Other long term (current) drug therapy: Secondary | ICD-10-CM | POA: Diagnosis not present

## 2021-11-16 DIAGNOSIS — I25118 Atherosclerotic heart disease of native coronary artery with other forms of angina pectoris: Secondary | ICD-10-CM

## 2021-11-16 DIAGNOSIS — E785 Hyperlipidemia, unspecified: Secondary | ICD-10-CM | POA: Diagnosis not present

## 2021-11-16 DIAGNOSIS — I5033 Acute on chronic diastolic (congestive) heart failure: Secondary | ICD-10-CM | POA: Insufficient documentation

## 2021-11-16 DIAGNOSIS — I255 Ischemic cardiomyopathy: Secondary | ICD-10-CM

## 2021-11-16 DIAGNOSIS — I10 Essential (primary) hypertension: Secondary | ICD-10-CM

## 2021-11-16 LAB — BASIC METABOLIC PANEL
Anion gap: 7 (ref 5–15)
BUN: 15 mg/dL (ref 8–23)
CO2: 25 mmol/L (ref 22–32)
Calcium: 8.4 mg/dL — ABNORMAL LOW (ref 8.9–10.3)
Chloride: 100 mmol/L (ref 98–111)
Creatinine, Ser: 1.42 mg/dL — ABNORMAL HIGH (ref 0.61–1.24)
GFR, Estimated: 47 mL/min — ABNORMAL LOW (ref >60)
Glucose, Bld: 134 mg/dL — ABNORMAL HIGH (ref 70–99)
Potassium: 3.7 mmol/L (ref 3.5–5.1)
Sodium: 132 mmol/L — ABNORMAL LOW (ref 135–145)

## 2021-11-16 MED ORDER — AMLODIPINE BESYLATE 2.5 MG PO TABS: 2.5000 mg | ORAL_TABLET | Freq: Every day | ORAL | 3 refills | Status: DC

## 2021-11-16 NOTE — Patient Instructions (Signed)
Medication Instructions:  1.Decrease amlodipine to 2.5 mg daily *If you need a refill on your cardiac medications before your next appointment, please call your pharmacy*   Lab Work: BMET today If you have labs (blood work) drawn today and your tests are completely normal, you will receive your results only by: [place] (if you have MyChart) OR A paper copy in the mail If you have any lab test that is abnormal or we need to change your treatment, we will call you to review the results.   Testing/Procedures: Your physician has requested that you have an echocardiogram. Echocardiography is a painless test that uses sound waves to create images of your heart. It provides your doctor with information about the size and shape of your heart and how well your heart's chambers and valves are working. This procedure takes approximately one hour. There are no restrictions for this procedure.    Follow-Up: At [facility], you and your health needs are our priority.  As part of our continuing mission to provide you with exceptional heart care, we have created designated Provider Care Teams.  These Care Teams include your primary Cardiologist (physician) and Advanced Practice Providers (APPs -  Physician Assistants and Nurse Practitioners) who all work together to provide you with the care you need, when you need it.  Your next appointment:   3 month(s)  The format for your next appointment:   In Person  Provider:   You may see Nelva Bush, MD or one of the following Advanced Practice Providers on your designated Care Team:   Murray Hodgkins, NP Christell Faith, PA-C Cadence Kathlen Mody, PA-C Gerrie Nordmann, NP   Important Information About Sugar

## 2021-11-16 NOTE — Progress Notes (Signed)
Follow-up Outpatient Visit Date: 11/16/2021  Primary Care Provider: Dewayne Shorter, [place] [address]  Chief Complaint: Leg swelling  HPI:  Jason Velazquez is a 88 y.o. male with history of CAD s/p CABG (1998; LIMA-LAD, SVG-D1-D2, SVG-rPL) and subsequent PCI to proximal LCx, stroke with residual memory deficites, carotid artery stenosis, hypertension, hyperlipidemia, and BPH, who presents for follow-up of coronary artery disease and leg swelling.  I last saw him in late August, at which time he complained of were sending leg swelling and chest pain at the sternoclavicular junctions.  I advised him to increase furosemide to 80 mg daily.  Chest pain was felt to be musculoskeletal in nature.  Today, Jason Velazquez reports that his leg swelling has improved but not completely resolved.  His weight is still about 10 pounds above his baseline weight from a year ago.  His sternoclavicular pain has resolved but he notes some pain along the right clavicle when moving in bed.  He does not have any chest pain with activity.  He tries to walk 1 mile every morning.  He denies shortness of breath, palpitations, and lightheadedness.  --------------------------------------------------------------------------------------------------  Past Medical History:  Diagnosis Date   Allergic rhinitis    Benign prostatic hypertrophy    CAD (coronary artery disease)    a. 1998 CABG x 4(LIMA-LAD, VG-D1-D2, VG-RPL); b. 2000 & 2006 PCI of the LCX/OM2; c. 03/2018 Cath: LM 25d, LAD 100ost/m, 26md, D2 100, fills via collats from D3, LCX 90ost/m ISR w/ evidence of stent fx, 898mOM2 30 ISR, 80/80, RCA 70p, RPDA 60ost, 70, RPAV 100, VG->D1->D2 100p (culprit), LIMA->LAD nl, VG->RPL2 nl. EF 45%-->Med Rx; d. 03/2021 MV: ant/antsept/apical inf/inflat isch, EF 75%.   Duodenal diverticulum 02/11/2001   GERD (gastroesophageal reflux disease)    H/O hiatal hernia 02/11/2001   Heart murmur    a. 03/2018 Echo: EF 55-60%,  DD, mild anterolateral HK.  Mild-mod TR. Mild Ca2+ of AoV and MV.   Hepatic lesion 02/11/2002   History of melanoma excision    FACE   Hypercholesteremia    Hypertension    Left carotid stenosis    > 50%  PER DUPLEX  03-28-2011   Mild mitral regurgitation    Perianal fistula    S/P CABG x 4    1988   S/P drug eluting coronary stent placement    POST CABG--  STENTING 2000  AND RESTENTING 2006 FOR IN-STENT STENOSIS   [facility]   Past Surgical History:  Procedure Laterality Date   CATARACT EXTRACTION W/ INTRAOCULAR LENS  IMPLANT, BILATERAL     CORONARY ANGIOPLASTY WITH STENT PLACEMENT  2000   PCI AND STENTING CIRCUMFLEX   CORONARY ANGIOPLASTY WITH STENT PLACEMENT  04-30-2004  DR HEDaneen Schick RE-INSTENT STENOSIS/  DRUG-ELUTING STENT OF PROXIMAL AND MID CIRCUMFLEX/ [place]/  WIDELY PATENT SAPHENOUS VEIN GRAFT AND  LIMA TO LAD GRAFT/ TOTAL OCCLUSION RIGHT CORONARY BEYOND THE POSTERIOR DESCENDING ARTERY BRANCH WITH 50% OSTIAL NARROWING/ TOTAL OCCLUSION OF LAD AT THE OSTIUM OF THE LEFT MAIN   CORONARY ARTERY BYPASS GRAFT  1988   X5   EVALUATION UNDER ANESTHESIA WITH ANAL FISTULECTOMY N/A 07/22/2012   Procedure: EXAM UNDER ANESTHESIA WITH ANAL fistulotomy;  Surgeon: AlLeighton RuffMD;  [place] [facility] Service: General;  Laterality: N/A;   LEFT HEART CATH AND CORS/GRAFTS ANGIOGRAPHY N/A 03/31/2018   Procedure: LEFT HEART CATH AND CORS/GRAFTS ANGIOGRAPHY;  Surgeon: EnNelva Bush  MD;  Location: [place] [facility];  Service: Cardiovascular;  Laterality: N/A;   SHOULDER ARTHROSCOPY WITH OPEN ROTATOR CUFF REPAIR Right 07-10-1999   TONSILLECTOMY AND ADENOIDECTOMY  1944   TRANSTHORACIC ECHOCARDIOGRAM  03-01-2011  DR Daneen Schick   NORMAL LVF AND LV SIZE/ MILD LEFT ATRIAL ENLARGEMENT/ GRADE II DIASTOLIC DYSFUNCTION WITH ELEVATED LEFT ATRIAL PRESSURE/ MILD TO MODERATE MR/ MILD TR   TRANSURETHRAL RESECTION OF PROSTATE  04/09/2011   Procedure:  TRANSURETHRAL RESECTION OF THE PROSTATE WITH GYRUS INSTRUMENTS;  Surgeon: Malka So, MD;  Location: [facility];  Service: Urology;  Laterality: N/A;    Current Meds  Medication Sig   amLODipine (NORVASC) 5 MG tablet Take 1 tablet (5 mg total) by mouth daily.   aspirin EC 81 MG tablet Take 81 mg by mouth daily. Swallow whole.   atorvastatin (LIPITOR) 80 MG tablet TAKE 1 TABLET BY MOUTH EVERYDAY AT BEDTIME   clopidogrel (PLAVIX) 75 MG tablet TAKE 1 TABLET BY MOUTH EVERY DAY   cyanocobalamin (,VITAMIN B-12,) 1000 MCG/ML injection Inject 1,000 mcg into the muscle every 30 (thirty) days.   furosemide (LASIX) 80 MG tablet Take 1 tablet (80 mg total) by mouth daily.   isosorbide mononitrate (IMDUR) 60 MG 24 hr tablet Take 1 tablet (60 mg total) by mouth daily.   ketoconazole (NIZORAL) 2 % cream Apply 1 application topically 2 (two) times daily.   metoprolol succinate (TOPROL-XL) 25 MG 24 hr tablet TAKE 1 TABLET (25 MG TOTAL) BY MOUTH DAILY. TAKE WITH OR IMMEDIATELY FOLLOWING A MEAL.   nitroGLYCERIN (NITROSTAT) 0.4 MG SL tablet PLACE 1 TABLET (0.4 MG TOTAL) UNDER THE TONGUE EVERY 5 (FIVE) MINUTES AS NEEDED FOR CHEST PAIN.   pantoprazole (PROTONIX) 40 MG tablet TAKE 1 TABLET BY MOUTH EVERY DAY   polyethylene glycol (MIRALAX / GLYCOLAX) packet Take 17 g by mouth daily.   potassium chloride SA (KLOR-CON M) 20 MEQ tablet Take 1 tablet (20 mEq total) by mouth daily.   tamsulosin (FLOMAX) 0.4 MG CAPS capsule Take 0.4 mg by mouth daily.   triamcinolone cream (KENALOG) 0.1 % Apply 1 application. topically 2 (two) times daily as needed. May keep in room to self-administer    Allergies: Tape, Iodine, and Niacin and related  Social History   Tobacco Use   Smoking status: Never   Smokeless tobacco: Never  Vaping Use   Vaping Use: Never used  Substance Use Topics   Alcohol use: No   Drug use: No    Family History  Problem Relation Age of Onset   Breast cancer Mother    Stroke Father    Colon cancer  Brother    Stroke Brother     Review of Systems: A 12-system review of systems was performed and was negative except as noted in the HPI.  --------------------------------------------------------------------------------------------------  Physical Exam: BP 124/70 (BP Location: Left Arm, Patient Position: Sitting, Cuff Size: Normal)   Pulse 77   Ht '5\' 4"'$  (1.626 m)   Wt 205 lb 6.4 oz (93.2 kg)   SpO2 96%   BMI 35.26 kg/m   General:  NAD. Neck: No JVD or HJR. Lungs: Clear to auscultation bilaterally without wheezes or crackles. Heart: Regular rate and rhythm with 2/6 systolic murmur. Abdomen: Soft, nontender, nondistended. Extremities: 1+ bilateral pretibial edema to the proximal calves.  EKG:  Normal sinus rhythm without abnormalities.  No significant change since 10/11/2021.  Lab Results  Component Value Date   WBC 6.8 10/11/2021   HGB 11.1 (L) 10/11/2021  HCT 34.0 (L) 10/11/2021   MCV 85.4 10/11/2021   PLT 229 10/11/2021    Lab Results  Component Value Date   NA 135 10/11/2021   K 3.6 10/11/2021   CL 101 10/11/2021   CO2 26 10/11/2021   BUN 16 10/11/2021   CREATININE 1.32 (H) 10/11/2021   GLUCOSE 110 (H) 10/11/2021   ALT 17 10/11/2021    Lab Results  Component Value Date   CHOL 109 10/11/2021   HDL 34 (L) 10/11/2021   LDLCALC 51 10/11/2021   TRIG 118 10/11/2021   CHOLHDL 3.2 10/11/2021    --------------------------------------------------------------------------------------------------  ASSESSMENT AND PLAN: Leg edema and acute on chronic HFpEF: Leg swelling and weight have improved slightly since her last visit but are not back to baseline.  We will continue with furosemide 80 mg daily and decrease amlodipine to 2.5 mg daily.  We will also arrange for an echocardiogram to ensure that new cardiomyopathy or valvular dysfunction has not developed.  I encouraged Jason Velazquez to continue minimizing his sodium intake and to elevate his legs when possible.  He can  also continue to use compression stockings, though he reports that this has not helped much in the past.  We will repeat a BMP today ensure stable renal function and electrolytes with escalation of furosemide at our last visit.  Coronary artery disease: No angina reported.  Continue current medications for secondary prevention.  Consider discontinuation of aspirin or clopidogrel at our follow-up visit.  Hypertension: Blood pressure normal today.  I have asked that his assisted living facility monitor his blood pressure at least twice a week to ensure that it is not climbing with de-escalation of amlodipine.  Hyperlipidemia: LDL at goal on last check in August.  Continue atorvastatin 80 mg daily.  Follow-up: Return to clinic in 3 months.  Nelva Bush, MD 11/16/2021 4:10 PM

## 2021-11-17 ENCOUNTER — Encounter: Payer: Self-pay | Admitting: Internal Medicine

## 2021-11-17 DIAGNOSIS — M7989 Other specified soft tissue disorders: Secondary | ICD-10-CM | POA: Insufficient documentation

## 2021-12-13 ENCOUNTER — Non-Acute Institutional Stay: Payer: Medicare Other | Admitting: Nurse Practitioner

## 2021-12-13 ENCOUNTER — Encounter: Payer: Self-pay | Admitting: Nurse Practitioner

## 2021-12-13 DIAGNOSIS — I255 Ischemic cardiomyopathy: Secondary | ICD-10-CM | POA: Diagnosis not present

## 2021-12-13 DIAGNOSIS — R351 Nocturia: Secondary | ICD-10-CM

## 2021-12-13 DIAGNOSIS — G3184 Mild cognitive impairment, so stated: Secondary | ICD-10-CM | POA: Diagnosis not present

## 2021-12-13 DIAGNOSIS — I5032 Chronic diastolic (congestive) heart failure: Secondary | ICD-10-CM

## 2021-12-13 DIAGNOSIS — K219 Gastro-esophageal reflux disease without esophagitis: Secondary | ICD-10-CM | POA: Diagnosis not present

## 2021-12-13 DIAGNOSIS — I10 Essential (primary) hypertension: Secondary | ICD-10-CM

## 2021-12-13 DIAGNOSIS — I700 Atherosclerosis of aorta: Secondary | ICD-10-CM

## 2021-12-13 DIAGNOSIS — E785 Hyperlipidemia, unspecified: Secondary | ICD-10-CM

## 2021-12-13 DIAGNOSIS — N401 Enlarged prostate with lower urinary tract symptoms: Secondary | ICD-10-CM | POA: Diagnosis not present

## 2021-12-13 NOTE — Progress Notes (Signed)
Location:  Other Nursing Home Room Number: [place] of Service:  ALF (13)  Dewayne Shorter, MD  Patient Care Team: Dewayne Shorter, MD as PCP - General (Family Medicine) End, Harrell Gave, MD as PCP - Cardiology (Cardiology) Leandrew Koyanagi, MD as Referring Physician (Ophthalmology) Lauree Chandler, NP as Nurse Practitioner (Geriatric Medicine)  Extended Emergency Contact Information Primary Emergency Contact: Mliss Sax [place] of [place] Mobile Phone: [phone] Relation: Daughter  Goals of care: Advanced Directive information    12/13/2021    3:22 PM  Advanced Directives  Does Patient Have a Medical Advance Directive? Yes  Type of Advance Directive [place]  Does patient want to make changes to medical advance directive? No - Patient declined  Copy of [place] in Chart? Yes - validated most recent copy scanned in chart (See row information)     Chief Complaint  Patient presents with   Medical Management of Chronic Issues    Routine follow up   Immunizations    COVID vaccine, shingrix vaccine and flu vaccine due.    HPI:  Pt is a 88 y.o. male seen today for an routine follow up visit for management of chronic issues.  He reports he is doing well.   CAD/hyperlipidemia- no chest pains. Continues on metoprolol, imdur and plavix with ASA. No abnormal bruising or bleeding.   CHF- on lasix and potassium supplement. No shortness of breath, cough or congestion.  Followed by cardiologist.   GERD- no symptoms on protonix  BPH s/p TURP- no complaints, stable on flomax.    Reports he has collar bone pain when he lays down, has been going on for ~1 year. No worsening of pain and not bad enough to take medication.   He has been at [facility] for several years, been in assisted living for ~10 months.     Past Medical History:  Diagnosis Date   Allergic rhinitis    Benign prostatic hypertrophy    CAD  (coronary artery disease)    a. 1998 CABG x 4(LIMA-LAD, VG-D1-D2, VG-RPL); b. 2000 & 2006 PCI of the LCX/OM2; c. 03/2018 Cath: LM 25d, LAD 100ost/m, 1md, D2 100, fills via collats from D3, LCX 90ost/m ISR w/ evidence of stent fx, 868mOM2 30 ISR, 80/80, RCA 70p, RPDA 60ost, 70, RPAV 100, VG->D1->D2 100p (culprit), LIMA->LAD nl, VG->RPL2 nl. EF 45%-->Med Rx; d. 03/2021 MV: ant/antsept/apical inf/inflat isch, EF 75%.   Duodenal diverticulum 02/11/2001   GERD (gastroesophageal reflux disease)    H/O hiatal hernia 02/11/2001   Heart murmur    a. 03/2018 Echo: EF 55-60%, DD, mild anterolateral HK.  Mild-mod TR. Mild Ca2+ of AoV and MV.   Hepatic lesion 02/11/2002   History of melanoma excision    FACE   Hypercholesteremia    Hypertension    Left carotid stenosis    > 50%  PER DUPLEX  03-28-2011   Mild mitral regurgitation    Perianal fistula    S/P CABG x 4    1988   S/P drug eluting coronary stent placement    POST CABG--  STENTING 2000  AND RESTENTING 2006 FOR IN-STENT STENOSIS   [facility]   Past Surgical History:  Procedure Laterality Date   CATARACT EXTRACTION W/ INTRAOCULAR LENS  IMPLANT, BILATERAL     CORONARY ANGIOPLASTY WITH STENT PLACEMENT  2000   PCI AND STENTING CIRCUMFLEX   CORONARY ANGIOPLASTY WITH STENT PLACEMENT  04-30-2004  DR HEDaneen Schick RE-INSTENT  STENOSIS/  DRUG-ELUTING STENT OF PROXIMAL AND MID CIRCUMFLEX/ PCI MID CIRCUMFLEX THROUGH STENT STRUT/  WIDELY PATENT SAPHENOUS VEIN GRAFT AND  LIMA TO LAD GRAFT/ TOTAL OCCLUSION RIGHT CORONARY BEYOND THE POSTERIOR DESCENDING ARTERY BRANCH WITH 50% OSTIAL NARROWING/ TOTAL OCCLUSION OF LAD AT THE OSTIUM OF THE LEFT MAIN   CORONARY ARTERY BYPASS GRAFT  1988   X5   EVALUATION UNDER ANESTHESIA WITH ANAL FISTULECTOMY N/A 07/22/2012   Procedure: EXAM UNDER ANESTHESIA WITH ANAL fistulotomy;  Surgeon: Leighton Ruff, MD;  Location: [place];  Service: General;  Laterality: N/A;   LEFT HEART CATH AND CORS/GRAFTS  ANGIOGRAPHY N/A 03/31/2018   Procedure: LEFT HEART CATH AND CORS/GRAFTS ANGIOGRAPHY;  Surgeon: Nelva Bush, MD;  Location: [place] CV LAB;  Service: Cardiovascular;  Laterality: N/A;   SHOULDER ARTHROSCOPY WITH OPEN ROTATOR CUFF REPAIR Right 07-10-1999   TONSILLECTOMY AND ADENOIDECTOMY  1944   TRANSTHORACIC ECHOCARDIOGRAM  03-01-2011  DR Daneen Schick   NORMAL LVF AND LV SIZE/ MILD LEFT ATRIAL ENLARGEMENT/ GRADE II DIASTOLIC DYSFUNCTION WITH ELEVATED LEFT ATRIAL PRESSURE/ MILD TO MODERATE MR/ MILD TR   TRANSURETHRAL RESECTION OF PROSTATE  04/09/2011   Procedure: TRANSURETHRAL RESECTION OF THE PROSTATE WITH GYRUS INSTRUMENTS;  Surgeon: Malka So, MD;  Location: [facility];  Service: Urology;  Laterality: N/A;    Allergies  Allergen Reactions   Tape Rash    ONLY USE PAPER TAPE ONLY USE PAPER TAPE   Iodine Rash    Rash when applied to skin   Niacin And Related Hives and Other (See Comments)    FLUSHING itching    Outpatient Encounter Medications as of 12/13/2021  Medication Sig   acetaminophen (TYLENOL) 325 MG tablet Take 650 mg by mouth every 4 (four) hours as needed.   alum & mag hydroxide-simeth (MAALOX/MYLANTA) 200-200-20 MG/5ML suspension Take by mouth every 4 (four) hours as needed for indigestion or heartburn.   amLODipine (NORVASC) 2.5 MG tablet Take 1 tablet (2.5 mg total) by mouth daily.   aspirin EC 81 MG tablet Take 81 mg by mouth daily. Swallow whole.   atorvastatin (LIPITOR) 80 MG tablet TAKE 1 TABLET BY MOUTH EVERYDAY AT BEDTIME   cetirizine (ZYRTEC) 10 MG chewable tablet Chew 10 mg by mouth as needed for allergies.   clobetasol cream (TEMOVATE) 0.05 % Apply topically 2 (two) times daily.   clopidogrel (PLAVIX) 75 MG tablet TAKE 1 TABLET BY MOUTH EVERY DAY   cyanocobalamin (,VITAMIN B-12,) 1000 MCG/ML injection Inject 1,000 mcg into the muscle every 30 (thirty) days.   dextromethorphan-guaiFENesin (ROBITUSSIN-DM) 10-100 MG/5ML liquid Take 10 mLs by mouth every 4  (four) hours as needed for cough.   diphenhydrAMINE HCl (BENADRYL ALLERGY PO) Take 1 capsule by mouth as needed.   furosemide (LASIX) 20 MG tablet Take 20 mg by mouth as needed. For the overnight weight gain of 3 lbs or for 5lb gain in one week   furosemide (LASIX) 80 MG tablet Take 1 tablet (80 mg total) by mouth daily.   Glucose 15 GM/32ML GEL Take 1 packet by mouth as needed.   isosorbide mononitrate (IMDUR) 30 MG 24 hr tablet Take 30 mg by mouth daily. For angina   isosorbide mononitrate (IMDUR) 60 MG 24 hr tablet Take 1 tablet (60 mg total) by mouth daily. (Patient taking differently: Take 60 mg by mouth daily. For cardiac)   ketoconazole (NIZORAL) 2 % cream Apply 1 application topically 2 (two) times daily.   Loteprednol Etabonate 0.2 % EMUL Apply 1  drop to eye as needed.   magnesium hydroxide (MILK OF MAGNESIA) 400 MG/5ML suspension Take 30 mLs by mouth daily as needed for mild constipation.   metoprolol succinate (TOPROL-XL) 25 MG 24 hr tablet TAKE 1 TABLET (25 MG TOTAL) BY MOUTH DAILY. TAKE WITH OR IMMEDIATELY FOLLOWING A MEAL.   nitroGLYCERIN (NITROSTAT) 0.4 MG SL tablet PLACE 1 TABLET (0.4 MG TOTAL) UNDER THE TONGUE EVERY 5 (FIVE) MINUTES AS NEEDED FOR CHEST PAIN.   nystatin (MYCOSTATIN/NYSTOP) powder Apply 1 Application topically 2 (two) times daily.   ondansetron (ZOFRAN) 4 MG tablet Take 4 mg by mouth as needed for nausea or vomiting. Up to 3 times a day   pantoprazole (PROTONIX) 40 MG tablet TAKE 1 TABLET BY MOUTH EVERY DAY (Patient taking differently: 40 mg 2 (two) times daily.)   polyethylene glycol (MIRALAX / GLYCOLAX) packet Take 17 g by mouth daily.   potassium chloride SA (KLOR-CON M) 20 MEQ tablet Take 1 tablet (20 mEq total) by mouth daily.   tamsulosin (FLOMAX) 0.4 MG CAPS capsule Take 0.4 mg by mouth daily.   [DISCONTINUED] triamcinolone cream (KENALOG) 0.1 % Apply 1 application. topically 2 (two) times daily as needed. May keep in room to self-administer   No  facility-administered encounter medications on file as of 12/13/2021.    Review of Systems  Constitutional:  Negative for activity change, appetite change, fatigue and unexpected weight change.  HENT:  Negative for congestion and hearing loss.   Eyes: Negative.   Respiratory:  Negative for cough and shortness of breath.   Cardiovascular:  Negative for chest pain, palpitations and leg swelling.  Gastrointestinal:  Negative for abdominal pain, constipation and diarrhea.  Genitourinary:  Negative for difficulty urinating and dysuria.  Musculoskeletal:  Negative for arthralgias and myalgias.  Skin:  Negative for color change and wound.  Neurological:  Negative for dizziness and weakness.  Psychiatric/Behavioral:  Negative for agitation, behavioral problems and confusion.     Immunization History  Administered Date(s) Administered   Influenza, High Dose Seasonal PF 12/13/2013, 12/27/2014, 11/02/2015, 11/11/2017   Influenza-Unspecified 11/11/2016, 11/28/2021   Moderna Sars-Covid-2 Vaccination 02/22/2019, 03/22/2019   Pneumococcal Conjugate-13 03/14/2014   Pneumococcal Polysaccharide-23 02/11/2013   Tdap 09/19/2015   Unspecified SARS-COV-2 Vaccination 07/10/2021   Zoster, Live 02/11/2013   Pertinent  Health Maintenance Due  Topic Date Due   INFLUENZA VACCINE  Completed      04/01/2018    9:40 AM 04/01/2018    8:00 PM 04/13/2018   12:58 PM 03/10/2019    7:17 PM 12/18/2020    2:40 AM  Fall Risk  Falls in the past year?   0 0   Patient Fall Risk Level High fall risk Moderate fall risk   Low fall risk   Functional Status Survey:    Vitals:   12/13/21 1519  BP: 115/65  Pulse: 72  Resp: 17  Temp: 98.4 F (36.9 C)  SpO2: 98%  Weight: 197 lb 8 oz (89.6 kg)  Height: '5\' 4"'$  (1.626 m)   Body mass index is 33.9 kg/m. Physical Exam Constitutional:      General: He is not in acute distress.    Appearance: He is well-developed. He is not diaphoretic.  HENT:     Head: Normocephalic  and atraumatic.     Right Ear: External ear normal.     Left Ear: External ear normal.     Mouth/Throat:     Pharynx: No oropharyngeal exudate.  Eyes:     Conjunctiva/sclera: Conjunctivae normal.  Pupils: Pupils are equal, round, and reactive to light.  Cardiovascular:     Rate and Rhythm: Normal rate and regular rhythm.     Heart sounds: Normal heart sounds.  Pulmonary:     Effort: Pulmonary effort is normal.     Breath sounds: Normal breath sounds.  Abdominal:     General: Bowel sounds are normal.     Palpations: Abdomen is soft.  Musculoskeletal:        General: No tenderness.     Cervical back: Normal range of motion and neck supple.     Right lower leg: No edema.     Left lower leg: No edema.  Skin:    General: Skin is warm and dry.  Neurological:     Mental Status: He is alert and oriented to person, place, and time.     Labs reviewed: Recent Labs    12/18/20 0248 09/03/21 0000 10/11/21 1539 11/16/21 1648  NA 144 132* 135 132*  K 4.0 4.0 3.6 3.7  CL 106 99 101 100  CO2 26 28* 26 25  GLUCOSE 106*  --  110* 134*  BUN '12 12 16 15  '$ CREATININE 1.03 1.2 1.32* 1.42*  CALCIUM 9.2 9.0 8.6* 8.4*   Recent Labs    09/03/21 0000 10/11/21 1539  AST  --  20  ALT  --  17  ALKPHOS 3.2* 90  BILITOT  --  0.9  PROT  --  6.6  ALBUMIN 4.1 3.7   Recent Labs    12/18/20 0248 10/11/21 1539  WBC 6.8 6.8  HGB 12.5* 11.1*  HCT 37.3* 34.0*  MCV 91.0 85.4  PLT 222 229   Lab Results  Component Value Date   TSH 4.94 (H) 08/25/2017   Lab Results  Component Value Date   HGBA1C 5.8 (H) 05/27/2013   Lab Results  Component Value Date   CHOL 109 10/11/2021   HDL 34 (L) 10/11/2021   LDLCALC 51 10/11/2021   TRIG 118 10/11/2021   CHOLHDL 3.2 10/11/2021    Significant Diagnostic Results in last 30 days:  No results found.  Assessment/Plan 1. Aortic atherosclerosis (HCC) Continues on ASA and statin  2. Chronic diastolic heart failure (HCC) -euvolemic at this  time. Continues with close follow up with cardiology.  Continues on lasix 80 mg daily with additional 20 mg for increase in weight.   3. Essential hypertension -Blood pressure well controlled Continue current medications and dietary modifications follow metabolic panel  4. Gastroesophageal reflux disease without esophagitis Stable, continues on protonix daily  5. MCI (mild cognitive impairment) -Stable, no acute changes in cognitive or functional status, continue supportive care at AL.  6. Benign prostatic hyperplasia with nocturia -urination stable, continues on flomax at bedtime.  7. Hyperlipidemia LDL goal <70 -LDL at goal, continues on lipitor.     Carlos American. [place], [facility] [phone]

## 2021-12-21 DIAGNOSIS — Z23 Encounter for immunization: Secondary | ICD-10-CM | POA: Diagnosis not present

## 2021-12-26 DIAGNOSIS — L570 Actinic keratosis: Secondary | ICD-10-CM | POA: Diagnosis not present

## 2021-12-26 DIAGNOSIS — L814 Other melanin hyperpigmentation: Secondary | ICD-10-CM | POA: Diagnosis not present

## 2021-12-26 DIAGNOSIS — L820 Inflamed seborrheic keratosis: Secondary | ICD-10-CM | POA: Diagnosis not present

## 2021-12-26 DIAGNOSIS — L905 Scar conditions and fibrosis of skin: Secondary | ICD-10-CM | POA: Diagnosis not present

## 2021-12-26 DIAGNOSIS — L821 Other seborrheic keratosis: Secondary | ICD-10-CM | POA: Diagnosis not present

## 2021-12-26 DIAGNOSIS — L853 Xerosis cutis: Secondary | ICD-10-CM | POA: Diagnosis not present

## 2022-01-02 ENCOUNTER — Other Ambulatory Visit: Payer: Self-pay | Admitting: Student

## 2022-01-02 DIAGNOSIS — Z760 Encounter for issue of repeat prescription: Secondary | ICD-10-CM

## 2022-01-02 MED ORDER — TAMSULOSIN HCL 0.4 MG PO CAPS: 0.4000 mg | ORAL_CAPSULE | Freq: Every day | ORAL | 0 refills | Status: AC

## 2022-01-02 MED ORDER — CLOPIDOGREL BISULFATE 75 MG PO TABS: 75.0000 mg | ORAL_TABLET | Freq: Every day | ORAL | 0 refills | Status: AC

## 2022-01-02 MED ORDER — ISOSORBIDE MONONITRATE ER 30 MG PO TB24: 30.0000 mg | ORAL_TABLET | Freq: Every day | ORAL | 0 refills | Status: DC

## 2022-01-02 MED ORDER — FUROSEMIDE 80 MG PO TABS: 80.0000 mg | ORAL_TABLET | Freq: Every day | ORAL | 0 refills | Status: DC

## 2022-01-02 MED ORDER — ASPIRIN 81 MG PO TBEC: 81.0000 mg | DELAYED_RELEASE_TABLET | Freq: Every day | ORAL | 0 refills | Status: AC

## 2022-01-02 MED ORDER — METOPROLOL SUCCINATE ER 25 MG PO TB24: 25.0000 mg | ORAL_TABLET | Freq: Every day | ORAL | 0 refills | Status: DC

## 2022-01-02 MED ORDER — ISOSORBIDE MONONITRATE ER 60 MG PO TB24: 60.0000 mg | ORAL_TABLET | Freq: Every day | ORAL | 0 refills | Status: DC

## 2022-01-02 MED ORDER — ATORVASTATIN CALCIUM 80 MG PO TABS: ORAL_TABLET | ORAL | 0 refills | Status: AC

## 2022-01-02 MED ORDER — POTASSIUM CHLORIDE CRYS ER 20 MEQ PO TBCR: 20.0000 meq | EXTENDED_RELEASE_TABLET | Freq: Every day | ORAL | 0 refills | Status: DC

## 2022-01-02 MED ORDER — AMLODIPINE BESYLATE 2.5 MG PO TABS: 2.5000 mg | ORAL_TABLET | Freq: Every day | ORAL | 0 refills | Status: DC

## 2022-01-02 MED ORDER — PANTOPRAZOLE SODIUM 40 MG PO TBEC: 40.0000 mg | DELAYED_RELEASE_TABLET | Freq: Two times a day (BID) | ORAL | 0 refills | Status: DC

## 2022-01-02 NOTE — Progress Notes (Signed)
Patient out of town without all of his medications for the duration of stay. Refill for 2 days to local pharmacy.

## 2022-01-09 ENCOUNTER — Other Ambulatory Visit: Payer: Medicare Other

## 2022-01-17 ENCOUNTER — Non-Acute Institutional Stay: Payer: Medicare Other | Admitting: Adult Health

## 2022-01-17 ENCOUNTER — Encounter: Payer: Self-pay | Admitting: Adult Health

## 2022-01-17 DIAGNOSIS — L853 Xerosis cutis: Secondary | ICD-10-CM

## 2022-01-17 DIAGNOSIS — I255 Ischemic cardiomyopathy: Secondary | ICD-10-CM | POA: Diagnosis not present

## 2022-01-17 DIAGNOSIS — R21 Rash and other nonspecific skin eruption: Secondary | ICD-10-CM | POA: Diagnosis not present

## 2022-01-17 MED ORDER — PREDNISONE 5 MG PO TABS: 5.0000 mg | ORAL_TABLET | Freq: Every day | ORAL | 0 refills | Status: AC

## 2022-01-17 NOTE — Progress Notes (Signed)
Location:  Other [place] Room Number: [place]:  ALF [phone]) Provider:  Durenda Age, DNP, FNP-BC  Patient Care Team: Jason Shorter, MD as PCP - General (Family Medicine) End, Harrell Gave, MD as PCP - Cardiology (Cardiology) Leandrew Koyanagi, MD as Referring Physician (Ophthalmology) Lauree Chandler, NP as Nurse Practitioner (Geriatric Medicine)  Extended Emergency Contact Information Primary Emergency Contact: Mliss Sax [place] of [place] Mobile Phone: [phone] Relation: Daughter  Code Status:   DNR  Goals of care: Advanced Directive information    12/13/2021    3:22 PM  Advanced Directives  Does Patient Have a Medical Advance Directive? Yes  Type of Advance Directive [place]  Does patient want to make changes to medical advance directive? No - Patient declined  Copy of [place] in Chart? Yes - validated most recent copy scanned in chart (See row information)     Chief Complaint  Patient presents with   Acute Visit    Rashes    HPI:  Pt is a 88 y.o. male seen today for an acute visit regarding rashes. Rashes are on his trunk (front and back), BUE and LLE. Rashes are flat. He denies itching and was not on any new medications. He has used Clobetasol cream and does not think it is working. He has a PMH of CAD s/P CABG, hypertension, hyperlipidemia and stroke.   Past Medical History:  Diagnosis Date   Allergic rhinitis    Benign prostatic hypertrophy    CAD (coronary artery disease)    a. 1998 CABG x 4(LIMA-LAD, VG-D1-D2, VG-RPL); b. 2000 & 2006 PCI of the LCX/OM2; c. 03/2018 Cath: LM 25d, LAD 100ost/m, 58md, D2 100, fills via collats from D3, LCX 90ost/m ISR w/ evidence of stent fx, 833mOM2 30 ISR, 80/80, RCA 70p, RPDA 60ost, 70, RPAV 100, VG->D1->D2 100p (culprit), LIMA->LAD nl, VG->RPL2 nl. EF 45%-->Med Rx; d. 03/2021 MV: ant/antsept/apical inf/inflat isch, EF 75%.    Duodenal diverticulum 02/11/2001   GERD (gastroesophageal reflux disease)    H/O hiatal hernia 02/11/2001   Heart murmur    a. 03/2018 Echo: EF 55-60%, DD, mild anterolateral HK.  Mild-mod TR. Mild Ca2+ of AoV and MV.   Hepatic lesion 02/11/2002   History of melanoma excision    FACE   Hypercholesteremia    Hypertension    Left carotid stenosis    > 50%  PER DUPLEX  03-28-2011   Mild mitral regurgitation    Perianal fistula    S/P CABG x 4    1988   S/P drug eluting coronary stent placement    POST CABG--  STENTING 2000  AND RESTENTING 2006 FOR IN-STENT STENOSIS   [facility]   Past Surgical History:  Procedure Laterality Date   CATARACT EXTRACTION W/ INTRAOCULAR LENS  IMPLANT, BILATERAL     CORONARY ANGIOPLASTY WITH STENT PLACEMENT  2000   PCI AND STENTING CIRCUMFLEX   CORONARY ANGIOPLASTY WITH STENT PLACEMENT  04-30-2004  DR HEDaneen Schick RE-INSTENT STENOSIS/  DRUG-ELUTING STENT OF PROXIMAL AND MID CIRCUMFLEX/ PCI MID CIRCUMFLEX THROUGH STENT STRUT/  WIDELY PATENT SAPHENOUS VEIN GRAFT AND  LIMA TO LAD GRAFT/ TOTAL OCCLUSION RIGHT CORONARY BEYOND THE POSTERIOR DESCENDING ARTERY BRANCH WITH 50% OSTIAL NARROWING/ TOTAL OCCLUSION OF LAD AT THE OSTIUM OF THE LEFT MAIN   CORONARY ARTERY BYPASS GRAFT  1988   X5   EVALUATION UNDER ANESTHESIA WITH ANAL FISTULECTOMY N/A 07/22/2012   Procedure: EXAM UNDER  ANESTHESIA WITH ANAL fistulotomy;  Surgeon: Leighton Ruff, MD;  Location: [facility];  Service: General;  Laterality: N/A;   LEFT HEART CATH AND CORS/GRAFTS ANGIOGRAPHY N/A 03/31/2018   Procedure: LEFT HEART CATH AND CORS/GRAFTS ANGIOGRAPHY;  Surgeon: Nelva Bush, MD;  Location: [place] CV LAB;  Service: Cardiovascular;  Laterality: N/A;   SHOULDER ARTHROSCOPY WITH OPEN ROTATOR CUFF REPAIR Right 07-10-1999   TONSILLECTOMY AND ADENOIDECTOMY  1944   TRANSTHORACIC ECHOCARDIOGRAM  03-01-2011  DR Daneen Schick   NORMAL LVF AND LV SIZE/ MILD LEFT ATRIAL ENLARGEMENT/  GRADE II DIASTOLIC DYSFUNCTION WITH ELEVATED LEFT ATRIAL PRESSURE/ MILD TO MODERATE MR/ MILD TR   TRANSURETHRAL RESECTION OF PROSTATE  04/09/2011   Procedure: TRANSURETHRAL RESECTION OF THE PROSTATE WITH GYRUS INSTRUMENTS;  Surgeon: Malka So, MD;  Location: [facility];  Service: Urology;  Laterality: N/A;    Allergies  Allergen Reactions   Tape Rash    ONLY USE PAPER TAPE ONLY USE PAPER TAPE   Iodine Rash    Rash when applied to skin   Niacin And Related Hives and Other (See Comments)    FLUSHING itching    Outpatient Encounter Medications as of 01/17/2022  Medication Sig   predniSONE (DELTASONE) 5 MG tablet Take 1 tablet (5 mg total) by mouth daily with breakfast for 4 days.   acetaminophen (TYLENOL) 325 MG tablet Take 650 mg by mouth every 4 (four) hours as needed.   alum & mag hydroxide-simeth (MAALOX/MYLANTA) 200-200-20 MG/5ML suspension Take by mouth every 4 (four) hours as needed for indigestion or heartburn.   amLODipine (NORVASC) 2.5 MG tablet Take 1 tablet (2.5 mg total) by mouth daily for 2 days.   aspirin EC 81 MG tablet Take 81 mg by mouth daily. Swallow whole.   atorvastatin (LIPITOR) 80 MG tablet Take one tablet nightly for high cholesterol   cetirizine (ZYRTEC) 10 MG chewable tablet Chew 10 mg by mouth as needed for allergies.   clobetasol cream (TEMOVATE) 0.05 % Apply topically 2 (two) times daily.   cyanocobalamin (,VITAMIN B-12,) 1000 MCG/ML injection Inject 1,000 mcg into the muscle every 30 (thirty) days.   dextromethorphan-guaiFENesin (ROBITUSSIN-DM) 10-100 MG/5ML liquid Take 10 mLs by mouth every 4 (four) hours as needed for cough.   diphenhydrAMINE HCl (BENADRYL ALLERGY PO) Take 1 capsule by mouth as needed.   furosemide (LASIX) 20 MG tablet Take 20 mg by mouth as needed. For the overnight weight gain of 3 lbs or for 5lb gain in one week   furosemide (LASIX) 80 MG tablet Take 1 tablet (80 mg total) by mouth daily for 2 days.   Glucose 15 GM/32ML GEL Take 1 packet  by mouth as needed.   isosorbide mononitrate (IMDUR) 30 MG 24 hr tablet Take 1 tablet (30 mg total) by mouth daily for 2 days. For angina   isosorbide mononitrate (IMDUR) 60 MG 24 hr tablet Take 1 tablet (60 mg total) by mouth daily for 2 days.   ketoconazole (NIZORAL) 2 % cream Apply 1 application topically 2 (two) times daily.   Loteprednol Etabonate 0.2 % EMUL Apply 1 drop to eye as needed.   magnesium hydroxide (MILK OF MAGNESIA) 400 MG/5ML suspension Take 30 mLs by mouth daily as needed for mild constipation.   metoprolol succinate (TOPROL-XL) 25 MG 24 hr tablet Take 1 tablet (25 mg total) by mouth daily for 2 days. Take with or immediately following a meal.   nitroGLYCERIN (NITROSTAT) 0.4 MG SL tablet PLACE 1 TABLET (0.4 MG  TOTAL) UNDER THE TONGUE EVERY 5 (FIVE) MINUTES AS NEEDED FOR CHEST PAIN.   nystatin (MYCOSTATIN/NYSTOP) powder Apply 1 Application topically 2 (two) times daily.   ondansetron (ZOFRAN) 4 MG tablet Take 4 mg by mouth as needed for nausea or vomiting. Up to 3 times a day   pantoprazole (PROTONIX) 40 MG tablet Take 1 tablet (40 mg total) by mouth 2 (two) times daily for 2 days.   polyethylene glycol (MIRALAX / GLYCOLAX) packet Take 17 g by mouth daily.   potassium chloride SA (KLOR-CON M) 20 MEQ tablet Take 1 tablet (20 mEq total) by mouth daily for 2 days.   No facility-administered encounter medications on file as of 01/17/2022.    Review of Systems  Constitutional:  Negative for activity change, appetite change and fever.  HENT:  Negative for sore throat.   Eyes: Negative.   Cardiovascular:  Negative for chest pain and leg swelling.  Gastrointestinal:  Negative for abdominal distention, diarrhea and vomiting.  Genitourinary:  Negative for dysuria, frequency and urgency.  Skin:  Positive for rash. Negative for color change.       Rashes appeared 3-4 weeks ago  Neurological:  Negative for dizziness and headaches.  Psychiatric/Behavioral:  Negative for behavioral  problems and sleep disturbance. The patient is not nervous/anxious.       Immunization History  Administered Date(s) Administered   Influenza, High Dose Seasonal PF 12/13/2013, 12/27/2014, 11/02/2015, 11/11/2017   Influenza-Unspecified 11/11/2016, 11/28/2021   Moderna Sars-Covid-2 Vaccination 02/22/2019, 03/22/2019   Pneumococcal Conjugate-13 03/14/2014   Pneumococcal Polysaccharide-23 02/11/2013   Tdap 09/19/2015   Unspecified SARS-COV-2 Vaccination 07/10/2021   Zoster, Live 02/11/2013   Pertinent  Health Maintenance Due  Topic Date Due   INFLUENZA VACCINE  Completed      04/01/2018    9:40 AM 04/01/2018    8:00 PM 04/13/2018   12:58 PM 03/10/2019    7:17 PM 12/18/2020    2:40 AM  Fall Risk  Falls in the past year?   0 0   Patient Fall Risk Level High fall risk Moderate fall risk   Low fall risk     Vitals:   01/17/22 1444  BP: 113/63  Pulse: 71  Resp: 17  Temp: 97.9 F (36.6 C)  Weight: 200 lb 8 oz (90.9 kg)  Height: '5\' 4"'$  (1.626 m)   Body mass index is 34.42 kg/m.  Physical Exam Constitutional:      General: He is not in acute distress.    Appearance: He is obese.  HENT:     Head: Normocephalic and atraumatic.     Mouth/Throat:     Mouth: Mucous membranes are moist.  Eyes:     Conjunctiva/sclera: Conjunctivae normal.  Cardiovascular:     Rate and Rhythm: Normal rate and regular rhythm.     Pulses: Normal pulses.     Heart sounds: Normal heart sounds.  Pulmonary:     Effort: Pulmonary effort is normal.     Breath sounds: Normal breath sounds.  Abdominal:     General: Bowel sounds are normal.     Palpations: Abdomen is soft.  Musculoskeletal:        General: No swelling. Normal range of motion.     Cervical back: Normal range of motion.  Skin:    General: Skin is warm and dry.     Findings: Rash present.     Comments: Flat erythematous rashes on trunk (front and back), BUE and left lower extremities  Neurological:  General: No focal deficit  present.     Mental Status: He is alert and oriented to person, place, and time. Mental status is at baseline.  Psychiatric:        Mood and Affect: Mood normal.        Behavior: Behavior normal.        Thought Content: Thought content normal.        Judgment: Judgment normal.        Labs reviewed: Recent Labs    09/03/21 0000 10/11/21 1539 11/16/21 1648  NA 132* 135 132*  K 4.0 3.6 3.7  CL 99 101 100  CO2 28* 26 25  GLUCOSE  --  110* 134*  BUN '12 16 15  '$ CREATININE 1.2 1.32* 1.42*  CALCIUM 9.0 8.6* 8.4*   Recent Labs    09/03/21 0000 10/11/21 1539  AST  --  20  ALT  --  17  ALKPHOS 3.2* 90  BILITOT  --  0.9  PROT  --  6.6  ALBUMIN 4.1 3.7   Recent Labs    10/11/21 1539  WBC 6.8  HGB 11.1*  HCT 34.0*  MCV 85.4  PLT 229   Lab Results  Component Value Date   TSH 4.94 (H) 08/25/2017   Lab Results  Component Value Date   HGBA1C 5.8 (H) 05/27/2013   Lab Results  Component Value Date   CHOL 109 10/11/2021   HDL 34 (L) 10/11/2021   LDLCALC 51 10/11/2021   TRIG 118 10/11/2021   CHOLHDL 3.2 10/11/2021    Significant Diagnostic Results in last 30 days:  No results found.  Assessment/Plan  1. Rash -  take Zyrtec 10 mg at bedtime X 2 weeks -  continue Clobetasol cream PRN - predniSONE (DELTASONE) 5 MG tablet; Take 1 tablet (5 mg total) by mouth daily with breakfast for 4 days.  Dispense: 4 tablet; Refill: 0  2. Dry skin - Eucerin cream apply to rashes BID    Family/ staff Communication: Discussed plan of care with resident and charge nurse.  Labs/tests ordered: None    Durenda Age, DNP, MSN, FNP-BC [facility] [phone] (Monday-Friday 8:00 a.m. - 5:00 p.m.) [phone] (after hours)

## 2022-01-17 NOTE — Patient Instructions (Signed)
Rash, Adult  A rash is a change in the color of your skin. A rash can also change the way your skin feels. There are many different conditions and factors that can cause a rash. Follow these instructions at home: The goal of treatment is to stop the itching and keep the rash from spreading. Watch for any changes in your symptoms. Let your doctor know about them. Follow these instructions to help with your condition: Medicine Take or apply over-the-counter and prescription medicines only as told by your doctor. These may include medicines: To treat red or swollen skin (corticosteroid creams). To treat itching. To treat an allergy (oral antihistamines). To treat very bad symptoms (oral corticosteroids).  Skin care Put cool cloths (compresses) on the affected areas. Do not scratch or rub your skin. Avoid covering the rash. Make sure that the rash is exposed to air as much as possible. Managing itching and discomfort Avoid hot showers or baths. These can make itching worse. A cold shower may help. Try taking a bath with: Epsom salts. You can get these at your local pharmacy or grocery store. Follow the instructions on the package. Baking soda. Pour a small amount into the bath as told by your doctor. Colloidal oatmeal. You can get this at your local pharmacy or grocery store. Follow the instructions on the package. Try putting baking soda paste onto your skin. Stir water into baking soda until it gets like a paste. Try putting on a lotion that relieves itchiness (calamine lotion). Keep cool and out of the sun. Sweating and being hot can make itching worse. General instructions  Rest as needed. Drink enough fluid to keep your pee (urine) pale yellow. Wear loose-fitting clothing. Avoid scented soaps, detergents, and perfumes. Use gentle soaps, detergents, perfumes, and other cosmetic products. Avoid anything that causes your rash. Keep a journal to help track what causes your rash. Write  down: What you eat. What cosmetic products you use. What you drink. What you wear. This includes jewelry. Keep all follow-up visits as told by your doctor. This is important. Contact a doctor if: You sweat at night. You lose weight. You pee (urinate) more than normal. You pee less than normal, or you notice that your pee is a darker color than normal. You feel weak. You throw up (vomit). Your skin or the whites of your eyes look yellow (jaundice). Your skin: Tingles. Is numb. Your rash: Does not go away after a few days. Gets worse. You are: More thirsty than normal. More tired than normal. You have: New symptoms. Pain in your belly (abdomen). A fever. Watery poop (diarrhea). Get help right away if: You have a fever and your symptoms suddenly get worse. You start to feel mixed up (confused). You have a very bad headache or a stiff neck. You have very bad joint pains or stiffness. You have jerky movements that you cannot control (seizure). Your rash covers all or most of your body. The rash may or may not be painful. You have blisters that: Are on top of the rash. Grow larger. Grow together. Are painful. Are inside your nose or mouth. You have a rash that: Looks like purple pinprick-sized spots all over your body. Has a "bull's eye" or looks like a target. Is red and painful, causes your skin to peel, and is not from being in the sun too long. Summary A rash is a change in the color of your skin. A rash can also change the way your skin   feels. The goal of treatment is to stop the itching and keep the rash from spreading. Take or apply over-the-counter and prescription medicines only as told by your doctor. Contact a doctor if you have new symptoms or symptoms that get worse. Keep all follow-up visits as told by your doctor. This is important. This information is not intended to replace advice given to you by your health care provider. Make sure you discuss any  questions you have with your health care provider. Document Revised: 07/31/2021 Document Reviewed: 11/09/2020 Elsevier Patient Education  2023 Elsevier Inc.  

## 2022-01-28 DIAGNOSIS — I7091 Generalized atherosclerosis: Secondary | ICD-10-CM | POA: Diagnosis not present

## 2022-01-28 DIAGNOSIS — B351 Tinea unguium: Secondary | ICD-10-CM | POA: Diagnosis not present

## 2022-01-30 DIAGNOSIS — L570 Actinic keratosis: Secondary | ICD-10-CM | POA: Diagnosis not present

## 2022-01-30 DIAGNOSIS — L821 Other seborrheic keratosis: Secondary | ICD-10-CM | POA: Diagnosis not present

## 2022-01-30 DIAGNOSIS — L814 Other melanin hyperpigmentation: Secondary | ICD-10-CM | POA: Diagnosis not present

## 2022-02-25 NOTE — Progress Notes (Signed)
Follow-up Outpatient Visit Date: 02/27/2022  Primary Care Provider: Dewayne Shorter, [place] [address]  Chief Complaint: Follow-up coronary artery disease and HFpEF  HPI:  Jason Velazquez is a 89 y.o. male with history of CAD s/p CABG (1998; LIMA-LAD, SVG-D1-D2, SVG-rPL) and subsequent PCI to proximal LCx, stroke with residual memory deficites, HFpEF, carotid artery stenosis, hypertension, hyperlipidemia, and BPH, who presents for follow-up of coronary artery disease and leg swelling.  I last saw him in October, at which time he reported improved but not completely resolved swelling in his legs.  We agreed to decrease amlodipine to 2.5 mg daily and obtain an echo, which was performed today.  Today, [name] reports that he has been doing fairly well.  His leg swelling has resolved, as he remains on furosemide 80 mg daily.  He has not had any chest pain, shortness of breath, palpitations, or lightheadedness.  Last 2 blood pressure checks at [facility] have been 130/90 and 120/90. --------------------------------------------------------------------------------------------------  Past Medical History:  Diagnosis Date   Allergic rhinitis    Benign prostatic hypertrophy    CAD (coronary artery disease)    a. 1998 CABG x 4(LIMA-LAD, VG-D1-D2, VG-RPL); b. 2000 & 2006 PCI of the LCX/OM2; c. 03/2018 Cath: LM 25d, LAD 100ost/m, 40md, D2 100, fills via collats from D3, LCX 90ost/m ISR w/ evidence of stent fx, 833mOM2 30 ISR, 80/80, RCA 70p, RPDA 60ost, 70, RPAV 100, VG->D1->D2 100p (culprit), LIMA->LAD nl, VG->RPL2 nl. EF 45%-->Med Rx; d. 03/2021 MV: ant/antsept/apical inf/inflat isch, EF 75%.   Duodenal diverticulum 02/11/2001   GERD (gastroesophageal reflux disease)    H/O hiatal hernia 02/11/2001   Heart murmur    a. 03/2018 Echo: EF 55-60%, DD, mild anterolateral HK.  Mild-mod TR. Mild Ca2+ of AoV and MV.   Hepatic lesion 02/11/2002   History of melanoma excision    FACE    Hypercholesteremia    Hypertension    Left carotid stenosis    > 50%  PER DUPLEX  03-28-2011   Mild mitral regurgitation    Perianal fistula    S/P CABG x 4    1988   S/P drug eluting coronary stent placement    POST CABG--  STENTING 2000  AND RESTENTING 2006 FOR IN-STENT STENOSIS   [facility]   Past Surgical History:  Procedure Laterality Date   CATARACT EXTRACTION W/ INTRAOCULAR LENS  IMPLANT, BILATERAL     CORONARY ANGIOPLASTY WITH STENT PLACEMENT  2000   PCI AND STENTING CIRCUMFLEX   CORONARY ANGIOPLASTY WITH STENT PLACEMENT  04-30-2004  DR HEDaneen Schick RE-INSTENT STENOSIS/  DRUG-ELUTING STENT OF PROXIMAL AND MID CIR[place]UMFLEX/ [place]  WIDELY PATENT SAPHENOUS VEIN GRAFT AND  LIMA TO LAD GRAFT/ TOTAL OCCLUSION RIGHT CORONARY BEYOND THE POSTERIOR DESCENDING ARTERY BRANCH WITH 50% OSTIAL NARROWING/ TOTAL OCCLUSION OF LAD AT THE OSTIUM OF THE LEFT MAIN   CORONARY ARTERY BYPASS GRAFT  1988   X5   EVALUATION UNDER ANESTHESIA WITH ANAL FISTULECTOMY N/A 07/22/2012   Procedure: EXAM UNDER ANESTHESIA WITH ANAL fistulotomy;  Surgeon: AlLeighton RuffMD;  [facility] Service: General;  Laterality: N/A;   LEFT HEART CATH AND CORS/GRAFTS ANGIOGRAPHY N/A 03/31/2018   Procedure: LEFT HEART CATH AND CORS/GRAFTS ANGIOGRAPHY;  Surgeon: EnNelva BushMD;  [facility];  Service: Cardiovascular;  Laterality: N/A;   SHOULDER ARTHROSCOPY WITH OPEN ROTATOR CUFF REPAIR Right 07-10-1999   TONSILLECTOMY  AND ADENOIDECTOMY  1944   TRANSTHORACIC ECHOCARDIOGRAM  03-01-2011  DR Daneen Schick   NORMAL LVF AND LV SIZE/ MILD LEFT ATRIAL ENLARGEMENT/ GRADE II DIASTOLIC DYSFUNCTION WITH ELEVATED LEFT ATRIAL PRESSURE/ MILD TO MODERATE MR/ MILD TR   TRANSURETHRAL RESECTION OF PROSTATE  04/09/2011   Procedure: TRANSURETHRAL RESECTION OF THE PROSTATE WITH GYRUS INSTRUMENTS;  Surgeon: Malka So, MD;  Location: [facility];  Service: Urology;   Laterality: N/A;   Current Meds  Medication Sig   amLODipine (NORVASC) 2.5 MG tablet Take 1 tablet (2.5 mg total) by mouth daily for 2 days.   aspirin EC 81 MG tablet Take 81 mg by mouth daily. Swallow whole.   atorvastatin (LIPITOR) 80 MG tablet Take one tablet nightly for high cholesterol   clobetasol cream (TEMOVATE) 0.05 % Apply topically 2 (two) times daily.   clopidogrel (PLAVIX) 75 MG tablet Take 75 mg by mouth daily.   cyanocobalamin (,VITAMIN B-12,) 1000 MCG/ML injection Inject 1,000 mcg into the muscle every 30 (thirty) days.   furosemide (LASIX) 80 MG tablet Take 1 tablet (80 mg total) by mouth daily for 2 days.   isosorbide mononitrate (IMDUR) 30 MG 24 hr tablet Take 1 tablet (30 mg total) by mouth daily for 2 days. For angina   isosorbide mononitrate (IMDUR) 60 MG 24 hr tablet Take 1 tablet (60 mg total) by mouth daily for 2 days.   metoprolol succinate (TOPROL-XL) 25 MG 24 hr tablet Take 1 tablet (25 mg total) by mouth daily for 2 days. Take with or immediately following a meal. (Patient taking differently: Take 25 mg by mouth every morning. Take with or immediately following a meal.)   pantoprazole (PROTONIX) 40 MG tablet Take 1 tablet (40 mg total) by mouth 2 (two) times daily for 2 days.   polyethylene glycol (MIRALAX / GLYCOLAX) packet Take 17 g by mouth daily.   potassium chloride SA (KLOR-CON M) 20 MEQ tablet Take 1 tablet (20 mEq total) by mouth daily for 2 days.   tamsulosin (FLOMAX) 0.4 MG CAPS capsule Take 0.4 mg by mouth daily.    Allergies: Tape, Iodine, and Niacin and related  Social History   Tobacco Use   Smoking status: Never   Smokeless tobacco: Never  Vaping Use   Vaping Use: Never used  Substance Use Topics   Alcohol use: No   Drug use: No    Family History  Problem Relation Age of Onset   Breast cancer Mother    Stroke Father    Colon cancer Brother    Stroke Brother     Review of Systems: A 12-system review of systems was performed and was  negative except as noted in the HPI.  --------------------------------------------------------------------------------------------------  Physical Exam: BP 130/76 (BP Location: Left Arm, Patient Position: Sitting, Cuff Size: Normal)   Pulse 77   Ht '5\' 4"'$  (1.626 m)   Wt 206 lb 9.6 oz (93.7 kg)   SpO2 98%   BMI 35.46 kg/m   General:  NAD. Neck: No JVD or HJR. Lungs: Clear to auscultation bilaterally without wheezes or crackles. Heart: Regular rate and rhythm without murmurs, rubs, or gallops. Abdomen: Soft, nontender, nondistended. Extremities: 1+ pretibial edema bilaterally.  Lab Results  Component Value Date   WBC 6.8 10/11/2021   HGB 11.1 (L) 10/11/2021   HCT 34.0 (L) 10/11/2021   MCV 85.4 10/11/2021   PLT 229 10/11/2021    Lab Results  Component Value Date   NA 132 (L) 11/16/2021   K  3.7 11/16/2021   CL 100 11/16/2021   CO2 25 11/16/2021   BUN 15 11/16/2021   CREATININE 1.42 (H) 11/16/2021   GLUCOSE 134 (H) 11/16/2021   ALT 17 10/11/2021    Lab Results  Component Value Date   CHOL 109 10/11/2021   HDL 34 (L) 10/11/2021   LDLCALC 51 10/11/2021   TRIG 118 10/11/2021   CHOLHDL 3.2 10/11/2021    --------------------------------------------------------------------------------------------------  ASSESSMENT AND PLAN: Coronary artery disease with stable angina: Mr. Mroczkowski denies chest pain.  As he is almost 4 years out from his NSTEMI that was managed medically in 03/2018, I think it would be prudent to discontinue aspirin and continue single antiplatelet therapy with clopidogrel 75 mg daily to minimize risk for bleeding.  Continue antianginal therapy with amlodipine, isosorbide mononitrate, and metoprolol succinate.  Continue atorvastatin 80 mg daily for secondary prevention of CAD.  I will check a CBC today in the setting of chronic antiplatelet therapy and mild anemia on last check 5 months ago.  Chronic HFpEF: Mr. Shearn still has mild leg edema, though he  reports that this is much better than in the past.  He remains on furosemide 80 mg p.o. daily.  I will check a BMP today to ensure that his potassium and renal function are stable.  Hypertension: Blood pressure upper normal today.  Defer medication changes at this time.  Hyperlipidemia: LDL well-controlled on last check in 09/2021.  Continue atorvastatin 80 mg daily.  Follow-up: Return to clinic in 6 months.  Nelva Bush, MD 02/27/2022 10:24 AM

## 2022-02-26 ENCOUNTER — Ambulatory Visit: Payer: Medicare Other | Attending: Internal Medicine

## 2022-02-26 ENCOUNTER — Telehealth: Payer: Self-pay | Admitting: Internal Medicine

## 2022-02-26 DIAGNOSIS — Z79899 Other long term (current) drug therapy: Secondary | ICD-10-CM | POA: Insufficient documentation

## 2022-02-26 DIAGNOSIS — E785 Hyperlipidemia, unspecified: Secondary | ICD-10-CM | POA: Diagnosis not present

## 2022-02-26 DIAGNOSIS — I5033 Acute on chronic diastolic (congestive) heart failure: Secondary | ICD-10-CM | POA: Diagnosis not present

## 2022-02-26 DIAGNOSIS — I10 Essential (primary) hypertension: Secondary | ICD-10-CM | POA: Diagnosis not present

## 2022-02-26 DIAGNOSIS — I5032 Chronic diastolic (congestive) heart failure: Secondary | ICD-10-CM | POA: Insufficient documentation

## 2022-02-26 DIAGNOSIS — I25118 Atherosclerotic heart disease of native coronary artery with other forms of angina pectoris: Secondary | ICD-10-CM | POA: Insufficient documentation

## 2022-02-26 MED ORDER — PERFLUTREN LIPID MICROSPHERE
1.0000 mL | INTRAVENOUS | Status: AC | PRN
  Administered 2022-02-26: 2 mL via INTRAVENOUS

## 2022-02-26 NOTE — Telephone Encounter (Signed)
Patient dropped off a Patient Care Update requested from [facility], placed in box.

## 2022-02-27 ENCOUNTER — Other Ambulatory Visit
Admission: RE | Admit: 2022-02-27 | Discharge: 2022-02-27 | Disposition: A | Discharge status: Home / Self Care | Payer: Medicare Other | Source: Ambulatory Visit | Attending: Internal Medicine | Admitting: Internal Medicine

## 2022-02-27 ENCOUNTER — Ambulatory Visit (INDEPENDENT_AMBULATORY_CARE_PROVIDER_SITE_OTHER): Payer: Medicare Other | Admitting: Internal Medicine

## 2022-02-27 ENCOUNTER — Encounter: Payer: Self-pay | Admitting: Internal Medicine

## 2022-02-27 VITALS — BP 130/76 | HR 77 | Ht 64.0 in | Wt 206.6 lb

## 2022-02-27 DIAGNOSIS — I5033 Acute on chronic diastolic (congestive) heart failure: Secondary | ICD-10-CM | POA: Diagnosis not present

## 2022-02-27 DIAGNOSIS — Z79899 Other long term (current) drug therapy: Secondary | ICD-10-CM

## 2022-02-27 DIAGNOSIS — E785 Hyperlipidemia, unspecified: Secondary | ICD-10-CM

## 2022-02-27 DIAGNOSIS — I10 Essential (primary) hypertension: Secondary | ICD-10-CM | POA: Diagnosis not present

## 2022-02-27 DIAGNOSIS — I25118 Atherosclerotic heart disease of native coronary artery with other forms of angina pectoris: Secondary | ICD-10-CM | POA: Diagnosis not present

## 2022-02-27 DIAGNOSIS — I5032 Chronic diastolic (congestive) heart failure: Secondary | ICD-10-CM

## 2022-02-27 LAB — BASIC METABOLIC PANEL
Anion gap: 9 (ref 5–15)
BUN: 11 mg/dL (ref 8–23)
CO2: 27 mmol/L (ref 22–32)
Calcium: 8.1 mg/dL — ABNORMAL LOW (ref 8.9–10.3)
Chloride: 99 mmol/L (ref 98–111)
Creatinine, Ser: 1.21 mg/dL (ref 0.61–1.24)
GFR, Estimated: 57 mL/min — ABNORMAL LOW (ref >60)
Glucose, Bld: 128 mg/dL — ABNORMAL HIGH (ref 70–99)
Potassium: 4 mmol/L (ref 3.5–5.1)
Sodium: 135 mmol/L (ref 135–145)

## 2022-02-27 LAB — CBC
HCT: 36.4 % — ABNORMAL LOW (ref 39.0–52.0)
Hemoglobin: 11.8 g/dL — ABNORMAL LOW (ref 13.0–17.0)
MCH: 27.8 pg (ref 26.0–34.0)
MCHC: 32.4 g/dL (ref 30.0–36.0)
MCV: 85.8 fL (ref 80.0–100.0)
Platelets: 267 10*3/uL (ref 150–400)
RBC: 4.24 MIL/uL (ref 4.22–5.81)
RDW: 13.7 % (ref 11.5–15.5)
WBC: 7.8 10*3/uL (ref 4.0–10.5)
nRBC: 0 % (ref 0.0–0.2)

## 2022-02-27 LAB — ECHOCARDIOGRAM COMPLETE
AR max vel: 2.6 cm2
AV Area VTI: 2.1 cm2
AV Area mean vel: 1.91 cm2
AV Mean grad: 3 mmHg
AV Peak grad: 4.9 mmHg
Ao pk vel: 1.11 m/s
Area-P 1/2: 3.65 cm2
S' Lateral: 2.6 cm

## 2022-02-27 NOTE — Patient Instructions (Signed)
Medication Instructions:  Your physician recommends the following medication changes.  STOP TAKING: Aspirin 81 mg daily  *If you need a refill on your cardiac medications before your next appointment, please call your pharmacy*   Lab Work: Your provider would like for you to have following labs drawn: (CBC, BMP).   Please go to the [facility] entrance and check in at the front desk.  You do not need an appointment.  They are open from 7am-6 pm.   If you have labs (blood work) drawn today and your tests are completely normal, you will receive your results only by: [place] (if you have MyChart) OR A paper copy in the mail If you have any lab test that is abnormal or we need to change your treatment, we will call you to review the results.   Testing/Procedures: None ordered today   Follow-Up: At [facility], you and your health needs are our priority.  As part of our continuing mission to provide you with exceptional heart care, we have created designated Provider Care Teams.  These Care Teams include your primary Cardiologist (physician) and Advanced Practice Providers (APPs -  Physician Assistants and Nurse Practitioners) who all work together to provide you with the care you need, when you need it.  We recommend signing up for the patient portal called "MyChart".  Sign up information is provided on this After Visit Summary.  MyChart is used to connect with patients for Virtual Visits (Telemedicine).  Patients are able to view lab/test results, encounter notes, upcoming appointments, etc.  Non-urgent messages can be sent to your provider as well.   To learn more about what you can do with MyChart, go to [email or web address].    Your next appointment:   6 month(s)  Provider:   You may see Nelva Bush, MD or one of the following Advanced Practice Providers on your designated Care Team:   Murray Hodgkins, NP Christell Faith, PA-C Cadence Kathlen Mody,  PA-C Gerrie Nordmann, NP

## 2022-03-01 NOTE — Telephone Encounter (Addendum)
Nurse contacted [facility] regarding paper work received. Left message to call back.

## 2022-03-08 NOTE — Telephone Encounter (Signed)
Attempted to contact [facility]. Left message to call back

## 2022-03-19 ENCOUNTER — Other Ambulatory Visit: Payer: Self-pay | Admitting: Internal Medicine

## 2022-03-19 DIAGNOSIS — Z760 Encounter for issue of repeat prescription: Secondary | ICD-10-CM

## 2022-04-04 DIAGNOSIS — I7091 Generalized atherosclerosis: Secondary | ICD-10-CM | POA: Diagnosis not present

## 2022-04-04 DIAGNOSIS — B351 Tinea unguium: Secondary | ICD-10-CM | POA: Diagnosis not present

## 2022-04-12 ENCOUNTER — Encounter: Payer: Self-pay | Admitting: Student

## 2022-04-12 ENCOUNTER — Non-Acute Institutional Stay: Payer: Medicare Other | Admitting: Student

## 2022-04-12 DIAGNOSIS — G3184 Mild cognitive impairment, so stated: Secondary | ICD-10-CM | POA: Diagnosis not present

## 2022-04-12 DIAGNOSIS — I255 Ischemic cardiomyopathy: Secondary | ICD-10-CM

## 2022-04-12 DIAGNOSIS — I10 Essential (primary) hypertension: Secondary | ICD-10-CM | POA: Diagnosis not present

## 2022-04-12 DIAGNOSIS — I25118 Atherosclerotic heart disease of native coronary artery with other forms of angina pectoris: Secondary | ICD-10-CM

## 2022-04-12 DIAGNOSIS — I5032 Chronic diastolic (congestive) heart failure: Secondary | ICD-10-CM

## 2022-04-12 DIAGNOSIS — I739 Peripheral vascular disease, unspecified: Secondary | ICD-10-CM

## 2022-04-12 DIAGNOSIS — K219 Gastro-esophageal reflux disease without esophagitis: Secondary | ICD-10-CM

## 2022-04-12 DIAGNOSIS — I700 Atherosclerosis of aorta: Secondary | ICD-10-CM

## 2022-04-12 NOTE — Progress Notes (Signed)
Location:  Other Nursing Home Room Number: Gabriel Carina [id number] Place of Service:  ALF [phone]) Provider:  Wynn Banker, Jordan Hawks, MD  Patient Care Team: Dewayne Shorter, MD as PCP - General (Family Medicine) End, Harrell Gave, MD as PCP - Cardiology (Cardiology) Leandrew Koyanagi, MD as Referring Physician (Ophthalmology) Lauree Chandler, NP as Nurse Practitioner (Geriatric Medicine)  Extended Emergency Contact Information Primary Emergency Contact: Mliss Sax [place] Mobile Phone: [phone] Relation: Daughter  Code Status:  DNR Goals of care: Advanced Directive information    04/12/2022   12:51 PM  Advanced Directives  Does Patient Have a Medical Advance Directive? Yes  Type of Advance Directive Out of facility DNR (pink MOST or yellow form)  Does patient want to make changes to medical advance directive? No - Patient declined     No chief complaint on file.   HPI:  Pt is a 89 y.o. male seen today for medical management of chronic diseases.    Patient recently evaluated by EMT for chest pain that resolved without intervention. Unable to give history regarding the situation because he cannot remember. Otherwise feels like his normal self. No chest pain at this time. Denies SOB, HA, Changes in vision.   For her dad, it's quality over quantity. She doesn't want ot do anything that isn't going to change his current quality of life especially if it is invasive treatment.    His memory - she has concerns for the last 5 years. 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 His CV issues along with aging. He will ask her the same questions in the same conversation.    Past Medical History:  Diagnosis Date   Allergic rhinitis    Benign prostatic hypertrophy    CAD (coronary artery disease)    a. 1998 CABG x 4(LIMA-LAD, VG-D1-D2, VG-RPL); b. 2000 & 2006 PCI of the LCX/OM2; c. 03/2018 Cath: LM 25d, LAD 100ost/m, 33md, D2 100, fills via collats from D3,  LCX 90ost/m ISR w/ evidence of stent fx, 840mOM2 30 ISR, 80/80, RCA 70p, RPDA 60ost, 70, RPAV 100, VG->D1->D2 100p (culprit), LIMA->LAD nl, VG->RPL2 nl. EF 45%-->Med Rx; d. 03/2021 MV: ant/antsept/apical inf/inflat isch, EF 75%.   Duodenal diverticulum 02/11/2001   GERD (gastroesophageal reflux disease)    H/O hiatal hernia 02/11/2001   Heart murmur    a. 03/2018 Echo: EF 55-60%, DD, mild anterolateral HK.  Mild-mod TR. Mild Ca2+ of AoV and MV.   Hepatic lesion 02/11/2002   History of melanoma excision    FACE   Hypercholesteremia    Hypertension    Left carotid stenosis    > 50%  PER DUPLEX  03-28-2011   Mild mitral regurgitation    Perianal fistula    S/P CABG x 4    1988   S/P drug eluting coronary stent placement    POST CABG--  STENTING 2000  AND RESTENTING 2006 FOR IN-STENT STENOSIS   [facility]   Past Surgical History:  Procedure Laterality Date   CATARACT EXTRACTION W/ INTRAOCULAR LENS  IMPLANT, BILATERAL     CORONARY ANGIOPLASTY WITH STENT PLACEMENT  2000   PCI AND STENTING CIRCUMFLEX   CORONARY ANGIOPLASTY WITH STENT PLACEMENT  04-30-2004  DR HEDaneen Schick RE-INSTENT STENOSIS/  DRUG-ELUTING STENT OF PROXIMAL AND MID CIRCUMFLEX/ PCI MID CIRCUMFLEX THROUGH STENT STRUT/  WIDELY PATENT SAPHENOUS VEIN GRAFT AND  LIMA TO LAD GRAFT/ TOTAL OCCLUSION RIGHT CORONARY BEYOND THE POSTERIOR DESCENDING ARTERY BRANCH WITH 50% OSTIAL NARROWING/  TOTAL OCCLUSION OF LAD AT THE OSTIUM OF THE LEFT MAIN   CORONARY ARTERY BYPASS GRAFT  1988   X5   EVALUATION UNDER ANESTHESIA WITH ANAL FISTULECTOMY N/A 07/22/2012   Procedure: EXAM UNDER ANESTHESIA WITH ANAL fistulotomy;  Surgeon: Leighton Ruff, MD;  Location: [facility];  Service: General;  Laterality: N/A;   LEFT HEART CATH AND CORS/GRAFTS ANGIOGRAPHY N/A 03/31/2018   Procedure: LEFT HEART CATH AND CORS/GRAFTS ANGIOGRAPHY;  Surgeon: Nelva Bush, MD;  Location: [facility];  Service: Cardiovascular;  Laterality: N/A;    SHOULDER ARTHROSCOPY WITH OPEN ROTATOR CUFF REPAIR Right 07-10-1999   TONSILLECTOMY AND ADENOIDECTOMY  1944   TRANSTHORACIC ECHOCARDIOGRAM  03-01-2011  DR Daneen Schick   NORMAL LVF AND LV SIZE/ MILD LEFT ATRIAL ENLARGEMENT/ GRADE II DIASTOLIC DYSFUNCTION WITH ELEVATED LEFT ATRIAL PRESSURE/ MILD TO MODERATE MR/ MILD TR   TRANSURETHRAL RESECTION OF PROSTATE  04/09/2011   Procedure: TRANSURETHRAL RESECTION OF THE PROSTATE WITH GYRUS INSTRUMENTS;  Surgeon: Malka So, MD;  Location: [facility];  Service: Urology;  Laterality: N/A;    Allergies  Allergen Reactions   Tape Rash    ONLY USE PAPER TAPE ONLY USE PAPER TAPE   Iodine Rash    Rash when applied to skin   Niacin And Related Hives and Other (See Comments)    FLUSHING itching    Outpatient Encounter Medications as of 04/12/2022  Medication Sig   acetaminophen (TYLENOL) 325 MG tablet Take 650 mg by mouth every 4 (four) hours as needed.   alum & mag hydroxide-simeth (MAALOX/MYLANTA) 200-200-20 MG/5ML suspension Take by mouth every 4 (four) hours as needed for indigestion or heartburn.   amLODipine (NORVASC) 2.5 MG tablet Take 1 tablet (2.5 mg total) by mouth daily for 2 days.   atorvastatin (LIPITOR) 80 MG tablet Take one tablet nightly for high cholesterol   carbamide peroxide (DEBROX) 6.5 % OTIC solution Place 5 drops into both ears as needed.   cetirizine (ZYRTEC) 10 MG chewable tablet Chew 10 mg by mouth as needed for allergies.   clobetasol cream (TEMOVATE) 0.05 % Apply topically 2 (two) times daily.   clopidogrel (PLAVIX) 75 MG tablet Take 75 mg by mouth daily.   cyanocobalamin (,VITAMIN B-12,) 1000 MCG/ML injection Inject 1,000 mcg into the muscle every 30 (thirty) days.   dextromethorphan-guaiFENesin (ROBITUSSIN-DM) 10-100 MG/5ML liquid Take 10 mLs by mouth every 4 (four) hours as needed for cough.   Emollient (EUCERIN EX) Apply to BLE and trunk topically two times daily   furosemide (LASIX) 20 MG tablet Take 20 mg by mouth as  needed. For the overnight weight gain of 3 lbs or for 5lb gain in one week   furosemide (LASIX) 80 MG tablet Take 80 mg by mouth daily.   Glucose 15 GM/32ML GEL Take 1 packet by mouth as needed.   hydrocortisone 2.5 % cream Apply 1 Application topically 2 (two) times daily as needed.   isosorbide mononitrate (IMDUR) 30 MG 24 hr tablet Take 30 mg by mouth daily.   isosorbide mononitrate (IMDUR) 60 MG 24 hr tablet Take 60 mg by mouth daily.   ketoconazole (NIZORAL) 2 % cream Apply 1 application topically 2 (two) times daily.   Loteprednol Etabonate 0.2 % EMUL Apply 1 drop to eye as needed.   magnesium hydroxide (MILK OF MAGNESIA) 400 MG/5ML suspension Take 30 mLs by mouth daily as needed for mild constipation.   metoprolol succinate (TOPROL-XL) 25 MG 24 hr tablet Take 1 tablet (25 mg  total) by mouth daily for 2 days. Take with or immediately following a meal.   nitroGLYCERIN (NITROSTAT) 0.4 MG SL tablet PLACE 1 TABLET (0.4 MG TOTAL) UNDER THE TONGUE EVERY 5 (FIVE) MINUTES AS NEEDED FOR CHEST PAIN.   nystatin (MYCOSTATIN/NYSTOP) powder Apply 1 Application topically 2 (two) times daily.   ondansetron (ZOFRAN) 4 MG tablet Take 4 mg by mouth as needed for nausea or vomiting. Up to 3 times a day   OXYGEN 2lpm for dyspnea or SOB   pantoprazole (PROTONIX) 40 MG tablet Take 1 tablet (40 mg total) by mouth 2 (two) times daily for 2 days.   polyethylene glycol (MIRALAX / GLYCOLAX) packet Take 17 g by mouth daily.   potassium chloride SA (KLOR-CON M) 20 MEQ tablet TAKE 1 TABLET BY MOUTH ONCE DAILY  *TAKE WITH FOOD* *DO NOT CRUSH OR CHEW* *MAY DISSOLVE*   tamsulosin (FLOMAX) 0.4 MG CAPS capsule Take 0.4 mg by mouth daily.   [DISCONTINUED] diphenhydrAMINE HCl (BENADRYL ALLERGY PO) Take 1 capsule by mouth as needed.   No facility-administered encounter medications on file as of 04/12/2022.    Review of Systems  All other systems reviewed and are negative.   Immunization History  Administered Date(s)  Administered   Influenza, High Dose Seasonal PF 12/13/2013, 12/27/2014, 11/02/2015, 11/11/2017   Influenza-Unspecified 11/11/2016, 11/28/2020, 11/28/2021   Moderna Sars-Covid-2 Vaccination 02/22/2019, 03/22/2019   Pneumococcal Conjugate-13 03/14/2014   Pneumococcal Polysaccharide-23 02/11/2013   Tdap 09/19/2015   Unspecified SARS-COV-2 Vaccination 12/24/2019, 06/30/2020, 11/03/2020, 07/10/2021   Zoster, Live 02/11/2013   Pertinent  Health Maintenance Due  Topic Date Due   INFLUENZA VACCINE  Completed      04/01/2018    9:40 AM 04/01/2018    8:00 PM 04/13/2018   12:58 PM 03/10/2019    7:17 PM 12/18/2020    2:40 AM  Fall Risk  Falls in the past year?   0 0   (RETIRED) Patient Fall Risk Level High fall risk Moderate fall risk   Low fall risk   Functional Status Survey:    Vitals:   04/12/22 1235  BP: 127/71  Pulse: 73  Resp: 19  Temp: (!) 97.5 F (36.4 C)  SpO2: 93%  Weight: 200 lb 8 oz (90.9 kg)  Height: '5\' 4"'$  (1.626 m)   Body mass index is 34.42 kg/m. Physical Exam Constitutional:      Appearance: Normal appearance.  Cardiovascular:     Rate and Rhythm: Normal rate.     Pulses: Normal pulses.  Pulmonary:     Effort: Pulmonary effort is normal.  Skin:    General: Skin is warm and dry.  Neurological:     Mental Status: He is alert and oriented to person, place, and time.  Psychiatric:        Mood and Affect: Mood normal.     Labs reviewed: Recent Labs    10/11/21 1539 11/16/21 1648 02/27/22 1120  NA 135 132* 135  K 3.6 3.7 4.0  CL 101 100 99  CO2 '26 25 27  '$ GLUCOSE 110* 134* 128*  BUN '16 15 11  '$ CREATININE 1.32* 1.42* 1.21  CALCIUM 8.6* 8.4* 8.1*   Recent Labs    09/03/21 0000 10/11/21 1539  AST  --  20  ALT  --  17  ALKPHOS 3.2* 90  BILITOT  --  0.9  PROT  --  6.6  ALBUMIN 4.1 3.7   Recent Labs    10/11/21 1539 02/27/22 1120  WBC 6.8 7.8  HGB  11.1* 11.8*  HCT 34.0* 36.4*  MCV 85.4 85.8  PLT 229 267   Lab Results  Component Value  Date   TSH 4.94 (H) 08/25/2017   Lab Results  Component Value Date   HGBA1C 5.8 (H) 05/27/2013   Lab Results  Component Value Date   CHOL 109 10/11/2021   HDL 34 (L) 10/11/2021   LDLCALC 51 10/11/2021   TRIG 118 10/11/2021   CHOLHDL 3.2 10/11/2021    Significant Diagnostic Results in last 30 days:  No results found.  Assessment/Plan Essential hypertension  Aortic atherosclerosis (HCC), Chronic  Coronary artery disease of native artery of native heart with stable angina pectoris (HCC)  Ischemic cardiomyopathy  PVD (peripheral vascular disease) (HCC)  Chronic heart failure with preserved ejection fraction (HFpEF) (HCC)  Gastroesophageal reflux disease without esophagitis  MCI (mild cognitive impairment) Patient with extensive cardiac history. Followed by cardiology. ASA recently discontinued, however, patient continues on plavix. Plan to medically manage as possible and prevent invasive interventions. Plan to alert cardiologist of recent event. BP well-controlled on current regimen. Continue amlodipine 2.5 mg daily. Continue Lipitor 80 and Plavix 75 mg. Refill nitrostat SL tablets. Continue Flomax. Continue Imdur. Continue Lasix 80 mg daily. Ordered labs and ECG as outlined below. NO indication for ED evaluation at this time.    Family/ staff Communication: nursing, daughter (hcpoa).   Labs/tests ordered:  CBC, CMP, BNP, ECG.

## 2022-04-14 ENCOUNTER — Encounter: Payer: Self-pay | Admitting: Student

## 2022-04-15 DIAGNOSIS — L820 Inflamed seborrheic keratosis: Secondary | ICD-10-CM | POA: Diagnosis not present

## 2022-04-15 DIAGNOSIS — L814 Other melanin hyperpigmentation: Secondary | ICD-10-CM | POA: Diagnosis not present

## 2022-04-15 DIAGNOSIS — I2510 Atherosclerotic heart disease of native coronary artery without angina pectoris: Secondary | ICD-10-CM | POA: Diagnosis not present

## 2022-04-15 DIAGNOSIS — I5032 Chronic diastolic (congestive) heart failure: Secondary | ICD-10-CM | POA: Diagnosis not present

## 2022-04-15 DIAGNOSIS — E785 Hyperlipidemia, unspecified: Secondary | ICD-10-CM | POA: Diagnosis not present

## 2022-04-15 DIAGNOSIS — R079 Chest pain, unspecified: Secondary | ICD-10-CM | POA: Diagnosis not present

## 2022-04-15 DIAGNOSIS — L570 Actinic keratosis: Secondary | ICD-10-CM | POA: Diagnosis not present

## 2022-04-15 DIAGNOSIS — I10 Essential (primary) hypertension: Secondary | ICD-10-CM | POA: Diagnosis not present

## 2022-04-15 DIAGNOSIS — L821 Other seborrheic keratosis: Secondary | ICD-10-CM | POA: Diagnosis not present

## 2022-04-15 LAB — CBC AND DIFFERENTIAL
HCT: 34 — AB (ref 41–53)
Hemoglobin: 11.2 — AB (ref 13.5–17.5)
Neutrophils Absolute: 3709
Platelets: 244 10*3/uL (ref 150–400)
WBC: 6.1

## 2022-04-15 LAB — CBC: RBC: 4.02 (ref 3.87–5.11)

## 2022-04-15 LAB — COMPREHENSIVE METABOLIC PANEL
Albumin: 3.8 (ref 3.5–5.0)
Calcium: 8.7 (ref 8.7–10.7)
Globulin: 2.5
eGFR: 63

## 2022-04-15 LAB — BASIC METABOLIC PANEL
BUN: 10 (ref 4–21)
CO2: 24 — AB (ref 13–22)
Chloride: 98 — AB (ref 99–108)
Creatinine: 1.1 (ref 0.6–1.3)
Glucose: 95
Potassium: 3.8 mEq/L (ref 3.5–5.1)
Sodium: 133 — AB (ref 137–147)

## 2022-04-15 LAB — HEPATIC FUNCTION PANEL
ALT: 11 U/L (ref 10–40)
AST: 15 (ref 14–40)
Alkaline Phosphatase: 110 (ref 25–125)
Bilirubin, Total: 0.8

## 2022-04-24 IMAGING — CR DG CHEST 2V
1 series · 2 of 2 positions shown · non-contrast
Comparison: 03/31/2018

CLINICAL DATA: Chest pain

EXAM:
CHEST - 2 VIEW

[Series 1: dg chest 2 view · 0.14mm/px · 2 of 2 slices shown]
[im 1/2]
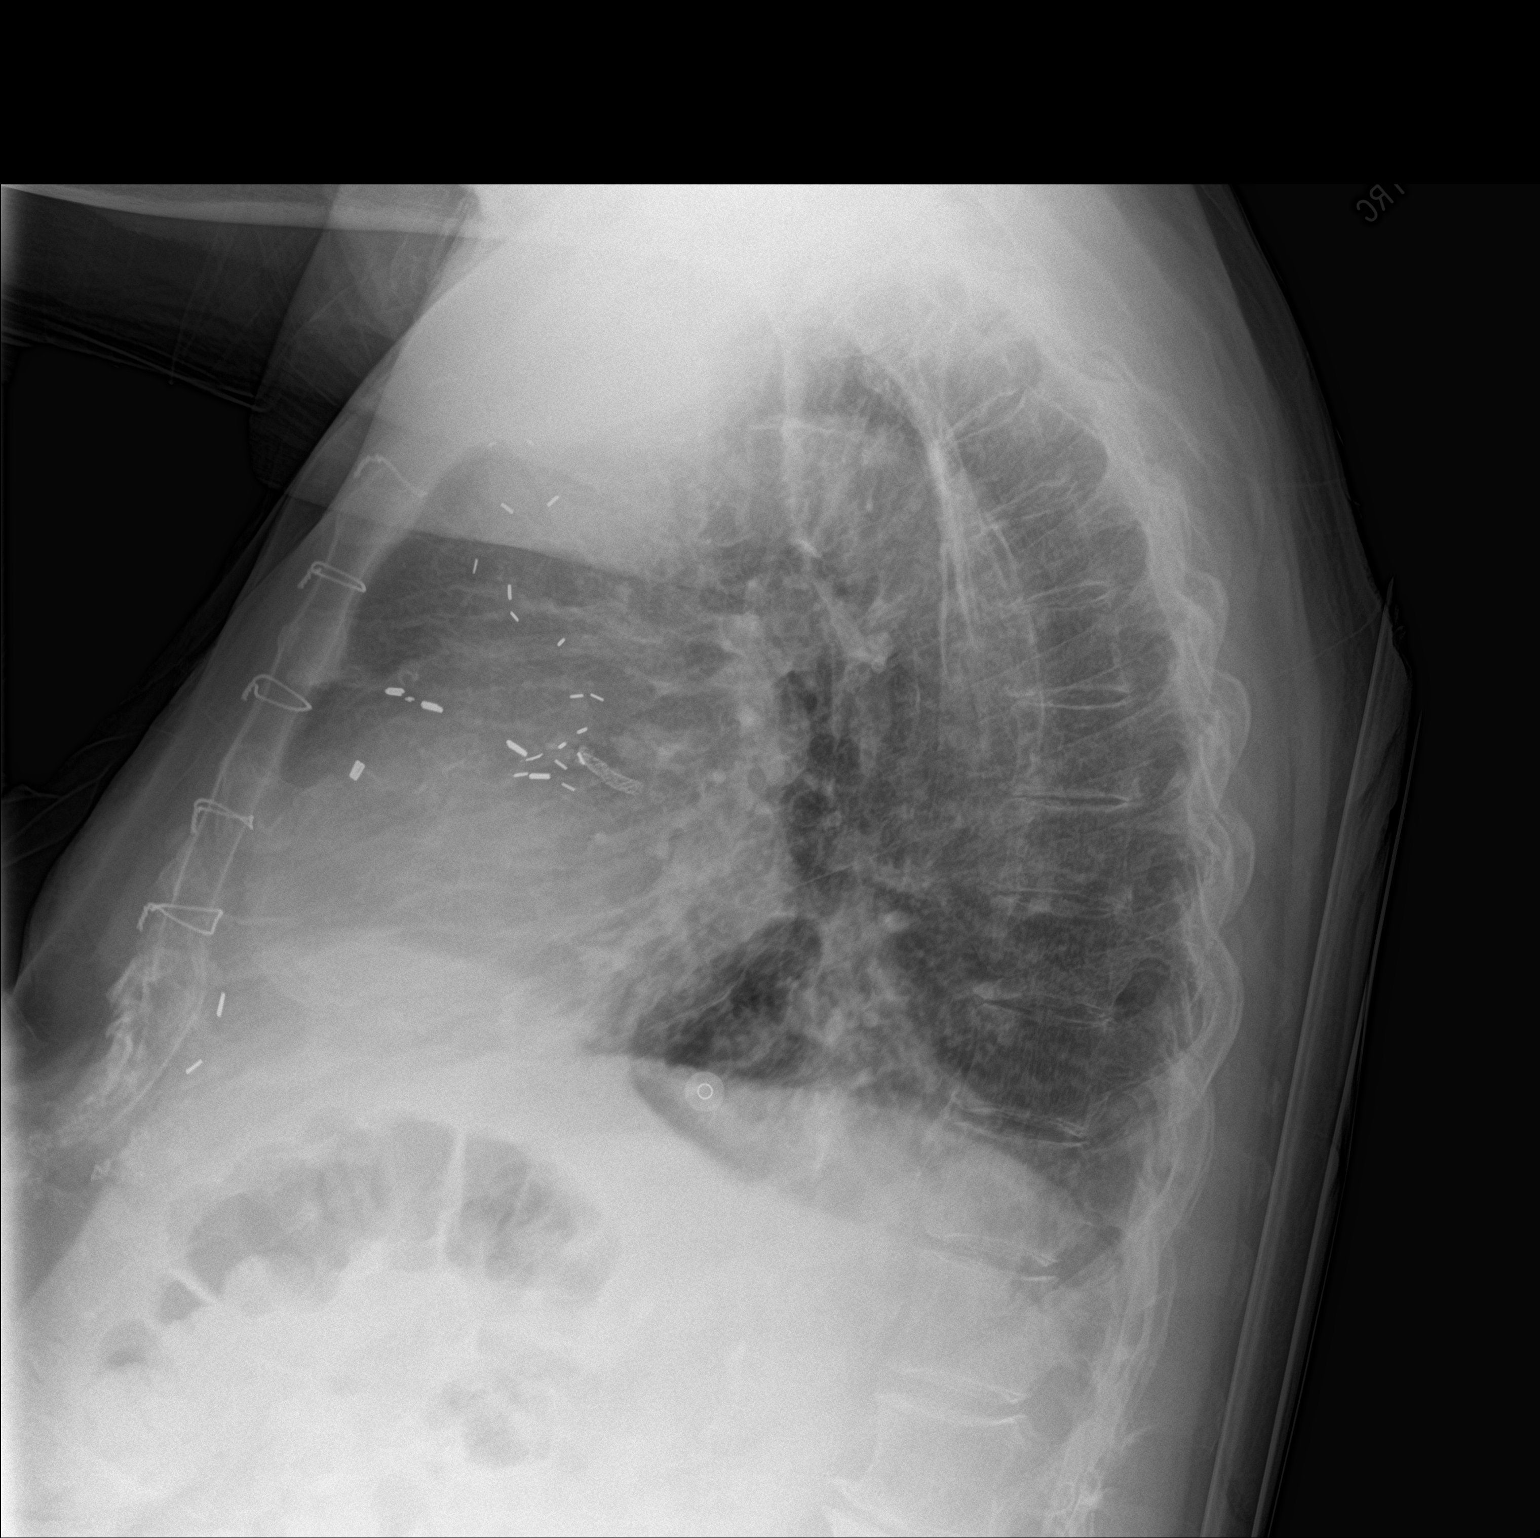
[im 2/2]
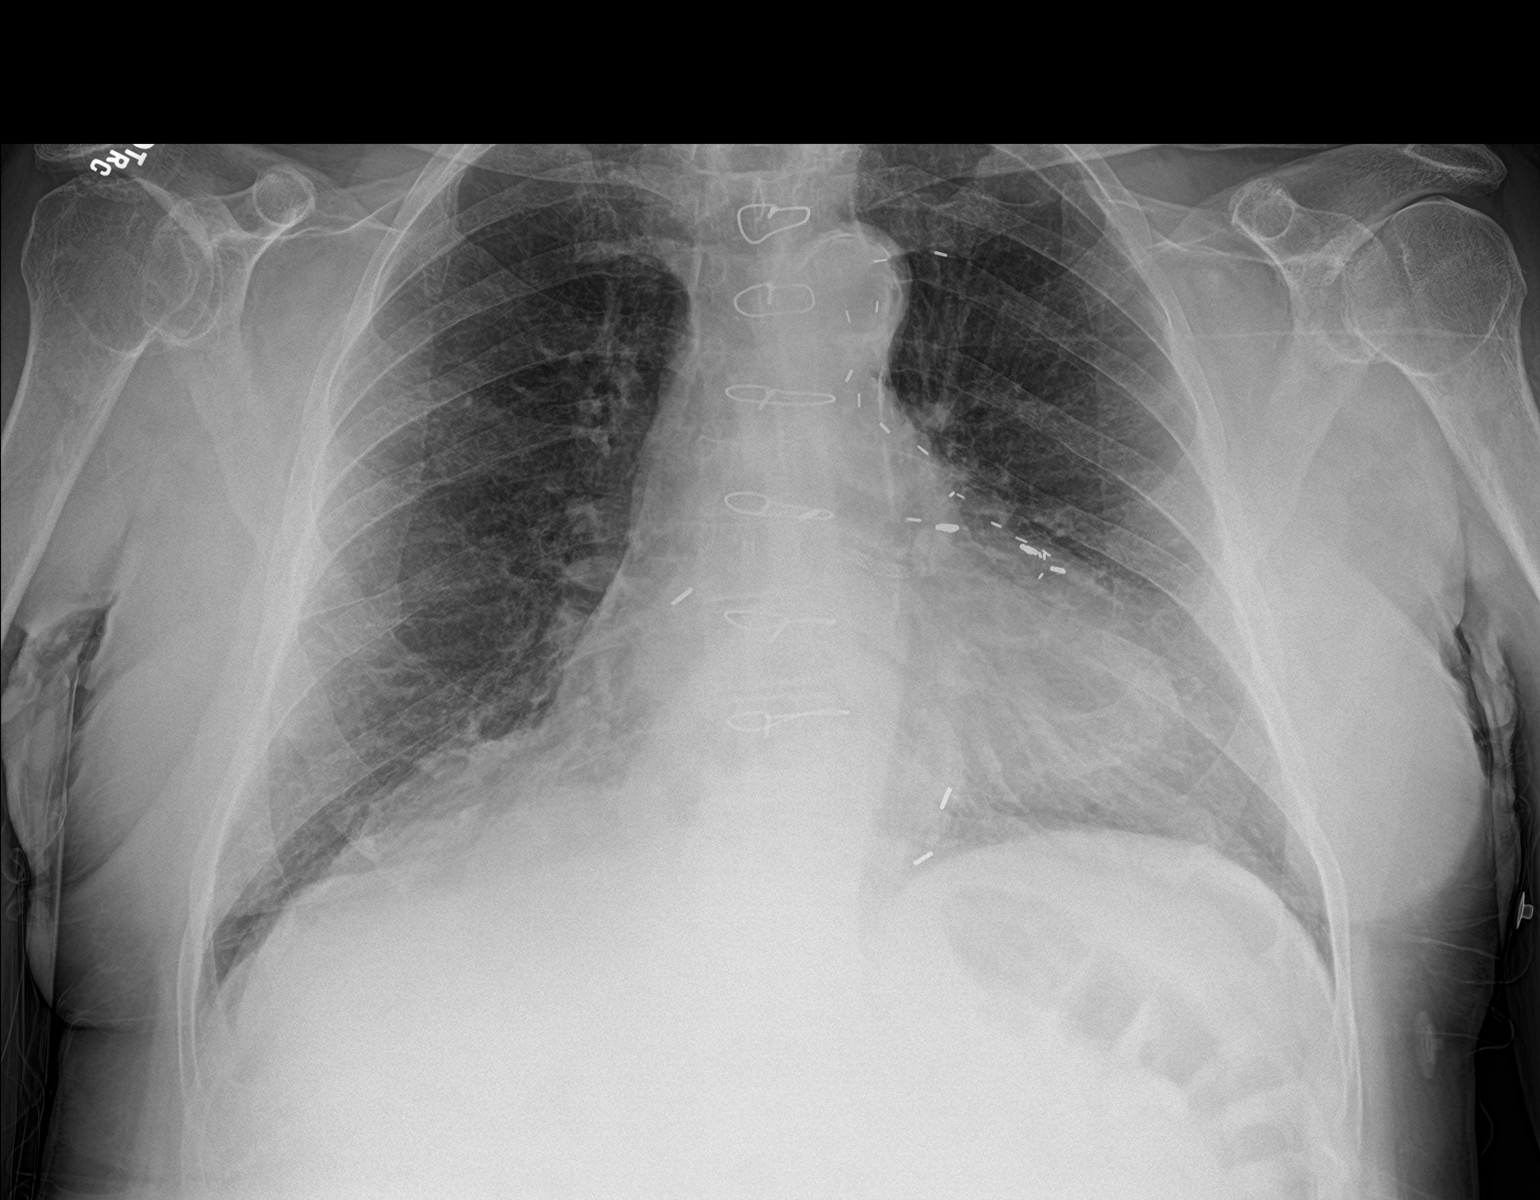

[2 of 2 positions shown; findings below may reference images not displayed]

FINDINGS: Left basilar opacity, best visualized on the lateral projection.
Cardiomediastinal contours are normal. No pleural effusion or
pneumothorax. Remote median sternotomy
IMPRESSION: Left basilar opacities may indicate developing infection.

## 2022-04-24 IMAGING — CT CT ANGIO CHEST
2 of 6 series · 18 of 46 positions shown · IV contrast (APPLIED)
Comparison: Chest radiographs 4174 hours today.

CLINICAL DATA: [AGE] male with central chest pain which woke
him from sleep.

EXAM:
CT ANGIOGRAPHY CHEST WITH CONTRAST
TECHNIQUE: Multidetector CT imaging of the chest was performed using the
standard protocol during bolus administration of intravenous
contrast. Multiplanar CT image reconstructions and MIPs were
obtained to evaluate the vascular anatomy.
CONTRAST:  75mL OMNIPAQUE IOHEXOL 350 MG/ML SOLN

[Series 4: coronal mpr · coronal · 0.58mm/px · 3 of 113 slices shown]
[im 29/113  soft-tissue]
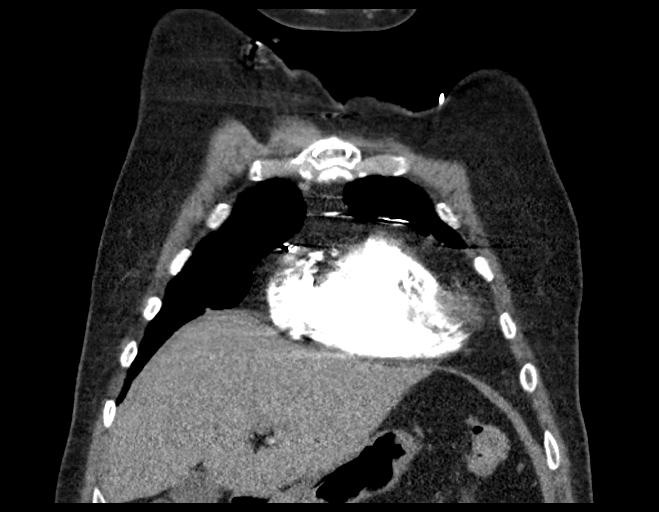
[im 57/113  soft-tissue]
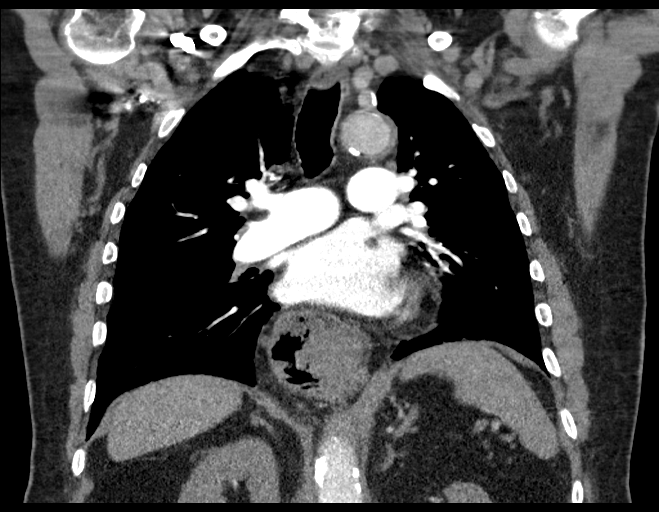
[im 85/113  soft-tissue]
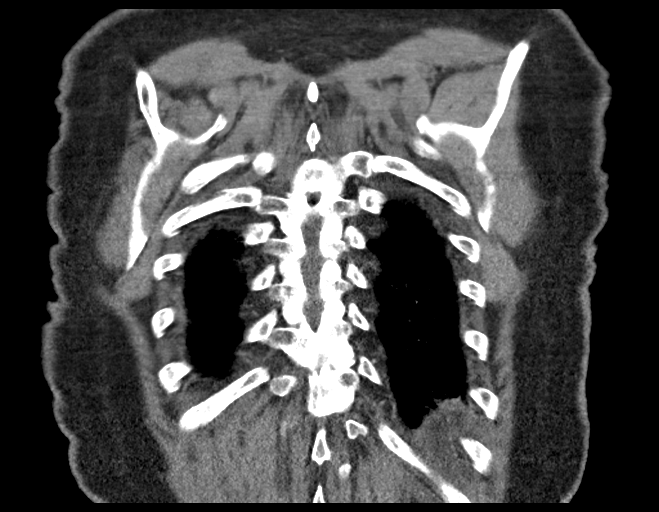

[Series 8: thins · axial · 0.78mm/px · z∈[-672,-411]mm · 15 of 357 slices shown]
[im 15/357  lung]
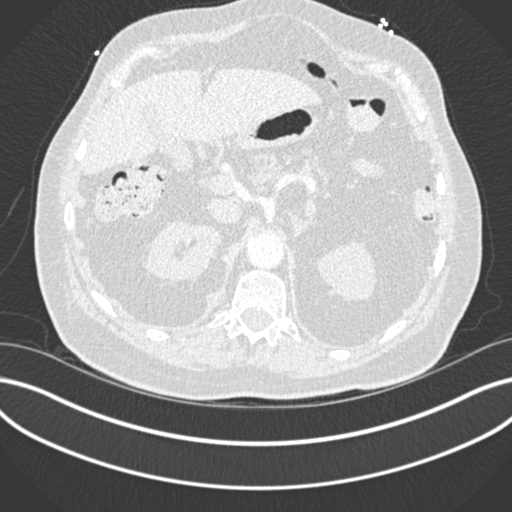
[im 45/357  soft-tissue]
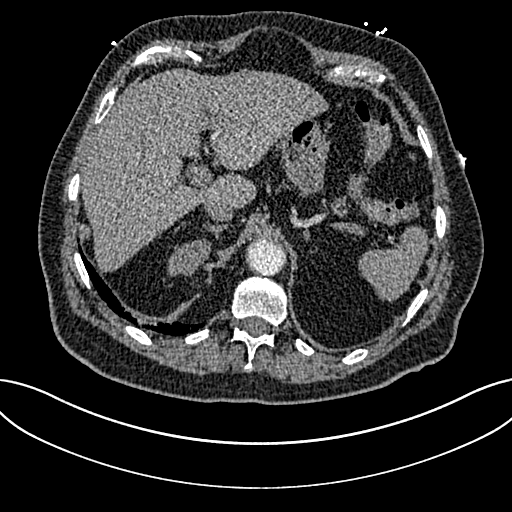
[im 60/357  lung]
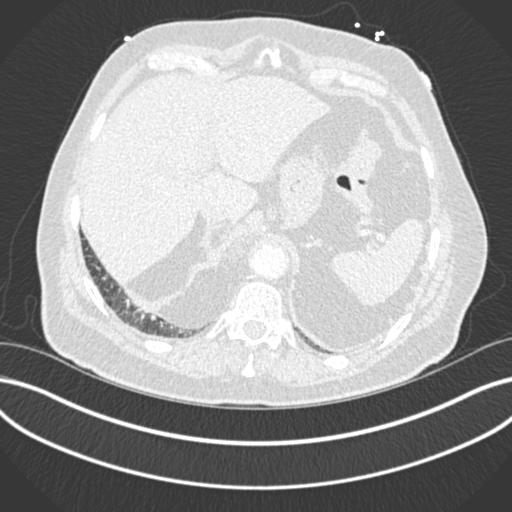
[im 90/357  soft-tissue]
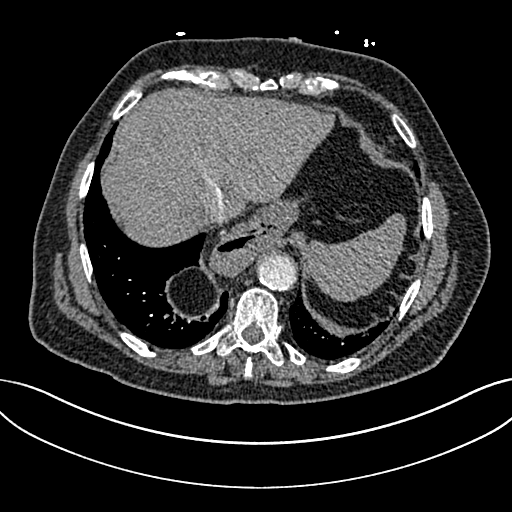
[im 104/357  lung]
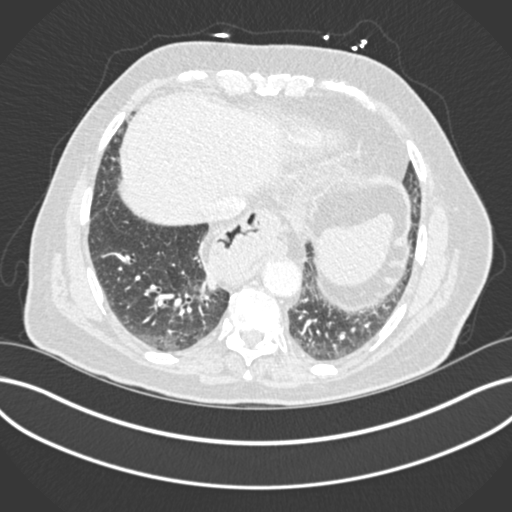
[im 134/357  soft-tissue]
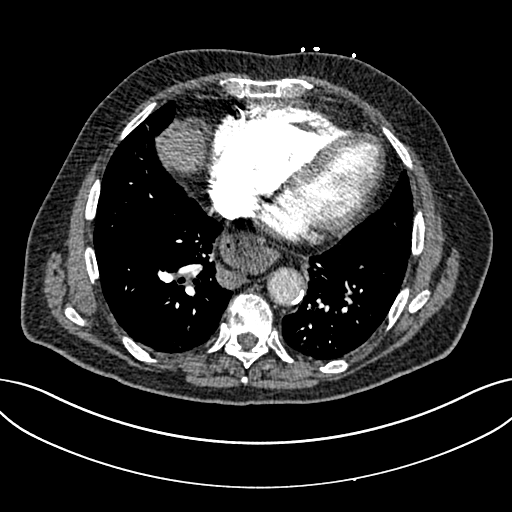
[im 149/357  lung]
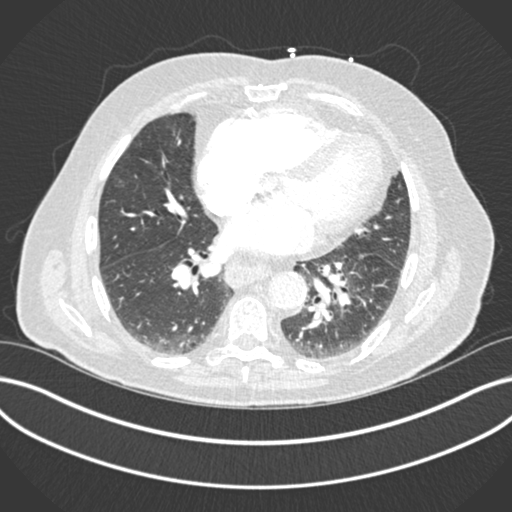
[im 179/357  soft-tissue]
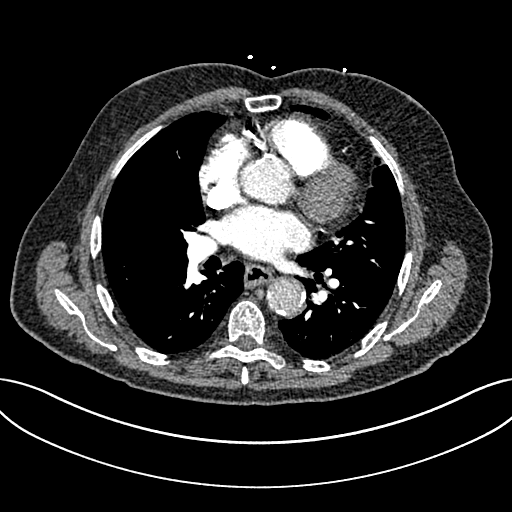
[im 208/357  lung]
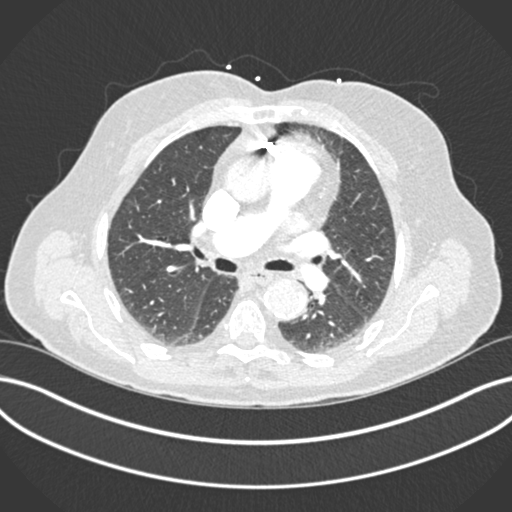
[im 223/357  soft-tissue]
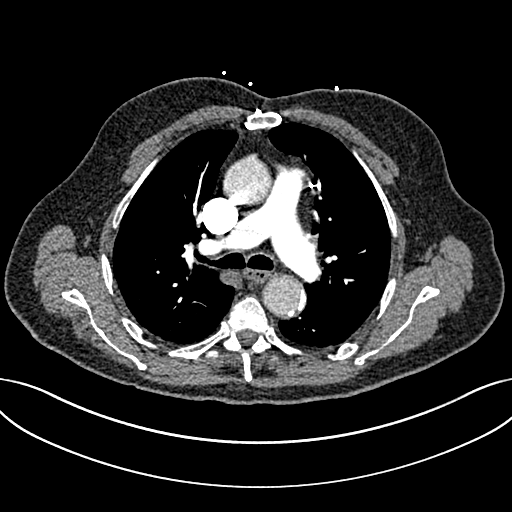
[im 253/357  lung]
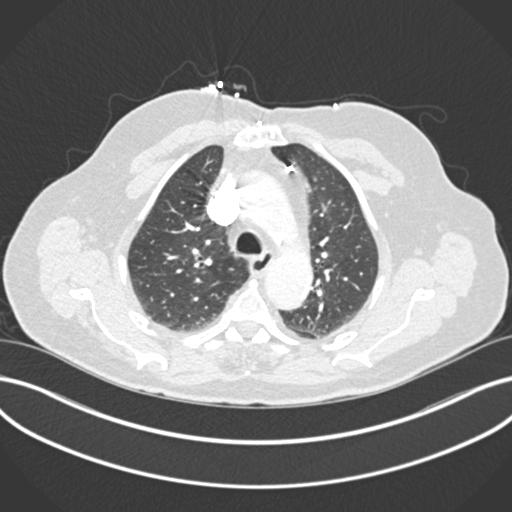
[im 268/357  soft-tissue]
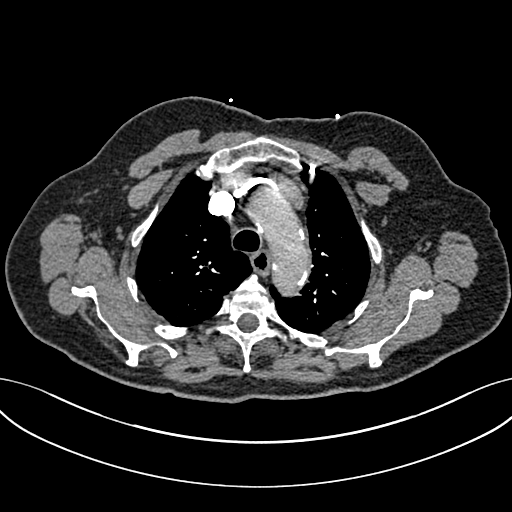
[im 297/357  lung]
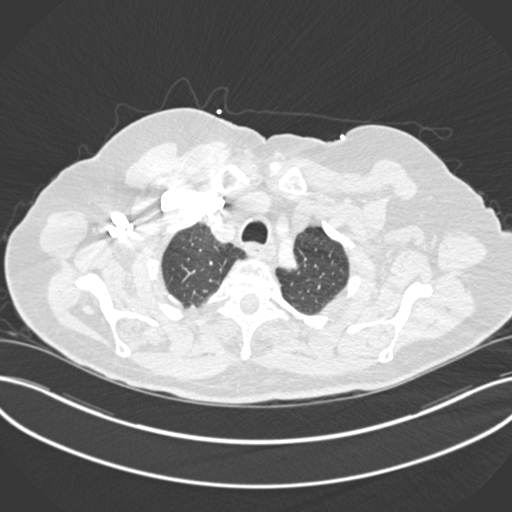
[im 312/357  soft-tissue]
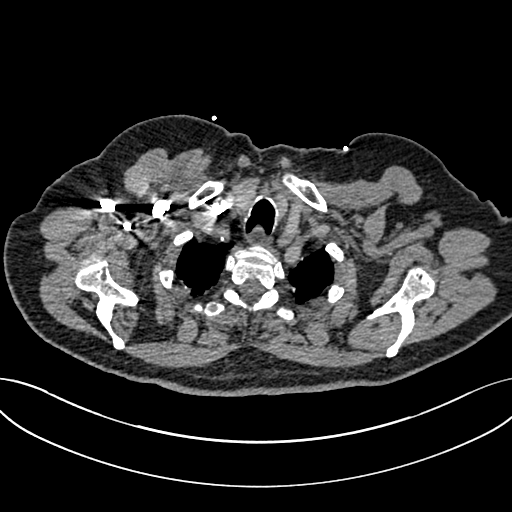
[im 342/357  lung]
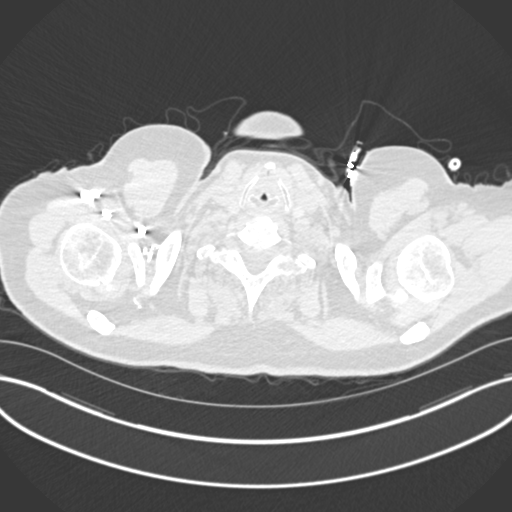

[18 of 46 positions shown; findings below may reference images not displayed]

FINDINGS: Cardiovascular: Good contrast bolus timing in the pulmonary arterial
tree.

No focal filling defect identified in the pulmonary arteries to
suggest acute pulmonary embolism.

Calcified aortic atherosclerosis. Prior CABG. Borderline to mild
cardiomegaly. No pericardial effusion.

Mediastinum/Nodes: No mediastinal lymphadenopathy. Small to moderate
gastric hiatal hernia.

Lungs/Pleura: Fat containing right Bochdalek's hernia in addition to
hiatal hernia. Major airways are patent. Mild dependent pulmonary
atelectasis. No pleural effusion or other acute pulmonary opacity.

Upper Abdomen: Negative visible liver, gallbladder, spleen,
pancreas, adrenal glands, kidneys and bowel loops in the upper
abdomen.

Musculoskeletal: Prior sternotomy. No acute osseous abnormality
identified.

Review of the MIP images confirms the above findings.
IMPRESSION: 1. Negative for acute pulmonary embolus.

2. No acute finding in the chest.
Prior CABG. Aortic Atherosclerosis (TNP6S-700.0). Borderline
cardiomegaly.
Small to moderate gastric hiatal hernia.
Right Bochdalek hernia.

## 2022-05-13 DIAGNOSIS — L821 Other seborrheic keratosis: Secondary | ICD-10-CM | POA: Diagnosis not present

## 2022-05-13 DIAGNOSIS — L570 Actinic keratosis: Secondary | ICD-10-CM | POA: Diagnosis not present

## 2022-05-13 DIAGNOSIS — L820 Inflamed seborrheic keratosis: Secondary | ICD-10-CM | POA: Diagnosis not present

## 2022-05-13 DIAGNOSIS — L814 Other melanin hyperpigmentation: Secondary | ICD-10-CM | POA: Diagnosis not present

## 2022-05-21 DIAGNOSIS — Z23 Encounter for immunization: Secondary | ICD-10-CM | POA: Diagnosis not present

## 2022-06-06 ENCOUNTER — Encounter: Payer: Self-pay | Admitting: Nurse Practitioner

## 2022-06-06 ENCOUNTER — Non-Acute Institutional Stay (INDEPENDENT_AMBULATORY_CARE_PROVIDER_SITE_OTHER): Payer: Medicare Other | Admitting: Nurse Practitioner

## 2022-06-06 DIAGNOSIS — Z0000 Encounter for general adult medical examination without abnormal findings: Secondary | ICD-10-CM

## 2022-06-06 NOTE — Progress Notes (Signed)
Subjective:   Jason Velazquez is a 89 y.o. male who presents for Medicare Annual/Subsequent preventive examination.  Review of Systems     Cardiac Risk Factors include: advanced age (>43men, >42 women);hypertension;dyslipidemia;male gender;obesity (BMI >30kg/m2)     Objective:    Today's Vitals   06/06/22 1519  BP: 137/73  Pulse: 81  Resp: 17  Temp: 97.7 F (36.5 C)  SpO2: 95%  Weight: 197 lb (89.4 kg)  Height: 5\' 4"  (1.626 m)   Body mass index is 33.81 kg/m.     06/06/2022    3:27 PM 04/12/2022   12:51 PM 12/13/2021    3:22 PM 12/18/2020    2:39 AM 04/13/2018   12:59 PM 03/31/2018    1:40 PM 03/31/2018    2:35 AM  Advanced Directives  Does Patient Have a Medical Advance Directive? Yes Yes Yes Yes Yes Yes Yes  Type of Advance Directive Out of facility DNR (pink MOST or yellow form) Out of facility DNR (pink MOST or yellow form) Healthcare Power of Attorney Living will;Healthcare Power of [employer] Power of [place];Living will Healthcare Power of [place];Living will   Does patient want to make changes to medical advance directive? No - Patient declined No - Patient declined No - Patient declined No - Patient declined  No - Patient declined No - Patient declined  Copy of Healthcare Power of Attorney in Chart?   Yes - validated most recent copy scanned in chart (See row information)   No - copy requested     Current Medications (verified) Outpatient Encounter Medications as of 06/06/2022  Medication Sig   acetaminophen (TYLENOL) 325 MG tablet Take 650 mg by mouth every 4 (four) hours as needed.   alum & mag hydroxide-simeth (MAALOX/MYLANTA) 200-200-20 MG/5ML suspension Take by mouth every 4 (four) hours as needed for indigestion or heartburn.   amLODipine (NORVASC) 2.5 MG tablet Take 1 tablet (2.5 mg total) by mouth daily for 2 days.   atorvastatin (LIPITOR) 80 MG tablet Take one tablet nightly for high cholesterol   carbamide peroxide (DEBROX) 6.5 % OTIC solution  Place 5 drops into both ears as needed.   cetirizine (ZYRTEC) 10 MG chewable tablet Chew 10 mg by mouth as needed for allergies.   clobetasol cream (TEMOVATE) 0.05 % Apply topically 2 (two) times daily.   clopidogrel (PLAVIX) 75 MG tablet Take 75 mg by mouth daily.   cyanocobalamin (,VITAMIN B-12,) 1000 MCG/ML injection Inject 1,000 mcg into the muscle every 30 (thirty) days.   dextromethorphan-guaiFENesin (ROBITUSSIN-DM) 10-100 MG/5ML liquid Take 10 mLs by mouth every 4 (four) hours as needed for cough.   Emollient (EUCERIN EX) Apply to BLE and trunk topically two times daily   furosemide (LASIX) 20 MG tablet Take 20 mg by mouth as needed. For the overnight weight gain of 3 lbs or for 5lb gain in one week   furosemide (LASIX) 80 MG tablet Take 80 mg by mouth daily.   Glucose 15 GM/32ML GEL Take 1 packet by mouth as needed.   hydrocortisone 2.5 % cream Apply 1 Application topically 2 (two) times daily as needed.   isosorbide mononitrate (IMDUR) 30 MG 24 hr tablet Take 30 mg by mouth daily.   isosorbide mononitrate (IMDUR) 60 MG 24 hr tablet Take 60 mg by mouth daily.   ketoconazole (NIZORAL) 2 % cream Apply 1 application topically 2 (two) times daily.   Loteprednol Etabonate 0.2 % EMUL Apply 1 drop to eye as needed.   magnesium hydroxide (MILK  OF MAGNESIA) 400 MG/5ML suspension Take 30 mLs by mouth daily as needed for mild constipation.   metoprolol succinate (TOPROL-XL) 25 MG 24 hr tablet Take 1 tablet (25 mg total) by mouth daily for 2 days. Take with or immediately following a meal.   nitroGLYCERIN (NITROSTAT) 0.4 MG SL tablet PLACE 1 TABLET (0.4 MG TOTAL) UNDER THE TONGUE EVERY 5 (FIVE) MINUTES AS NEEDED FOR CHEST PAIN.   nystatin (MYCOSTATIN/NYSTOP) powder Apply 1 Application topically 2 (two) times daily.   ondansetron (ZOFRAN) 4 MG tablet Take 4 mg by mouth as needed for nausea or vomiting. Up to 3 times a day   OXYGEN 2lpm for dyspnea or SOB   pantoprazole (PROTONIX) 40 MG tablet Take 1  tablet (40 mg total) by mouth 2 (two) times daily for 2 days.   polyethylene glycol (MIRALAX / GLYCOLAX) packet Take 17 g by mouth daily.   potassium chloride SA (KLOR-CON M) 20 MEQ tablet TAKE 1 TABLET BY MOUTH ONCE DAILY  *TAKE WITH FOOD* *DO NOT CRUSH OR CHEW* *MAY DISSOLVE*   tamsulosin (FLOMAX) 0.4 MG CAPS capsule Take 0.4 mg by mouth daily.   No facility-administered encounter medications on file as of 06/06/2022.    Allergies (verified) Tape, Iodine, and Niacin and related   History: Past Medical History:  Diagnosis Date   Allergic rhinitis    Benign prostatic hypertrophy    CAD (coronary artery disease)    a. 1998 CABG x 4(LIMA-LAD, VG-D1-D2, VG-RPL); b. 2000 & 2006 PCI of the LCX/OM2; c. 03/2018 Cath: LM 25d, LAD 100ost/m, 60m/d, D2 100, fills via collats from D3, LCX 90ost/m ISR w/ evidence of stent fx, 58m, OM2 30 ISR, 80/80, RCA 70p, RPDA 60ost, 70, RPAV 100, VG->D1->D2 100p (culprit), LIMA->LAD nl, VG->RPL2 nl. EF 45%-->Med Rx; d. 03/2021 MV: ant/antsept/apical inf/inflat isch, EF 75%.   Duodenal diverticulum 02/11/2001   GERD (gastroesophageal reflux disease)    H/O hiatal hernia 02/11/2001   Heart murmur    a. 03/2018 Echo: EF 55-60%, DD, mild anterolateral HK.  Mild-mod TR. Mild Ca2+ of AoV and MV.   Hepatic lesion 02/11/2002   History of melanoma excision    FACE   Hypercholesteremia    Hypertension    Left carotid stenosis    > 50%  PER DUPLEX  03-28-2011   Mild mitral regurgitation    Perianal fistula    S/P CABG x 4    1988   S/P drug eluting coronary stent placement    POST CABG--  STENTING 2000  AND RESTENTING 2006 FOR IN-STENT STENOSIS   Stroke    Past Surgical History:  Procedure Laterality Date   CATARACT EXTRACTION W/ INTRAOCULAR LENS  IMPLANT, BILATERAL     CORONARY ANGIOPLASTY WITH STENT PLACEMENT  2000   PCI AND STENTING CIRCUMFLEX   CORONARY ANGIOPLASTY WITH STENT PLACEMENT  04-30-2004  DR Verdis Prime   RE-INSTENT STENOSIS/  DRUG-ELUTING STENT OF  PROXIMAL AND MID CIRCUMFLEX/ PCI MID CIRCUMFLEX THROUGH STENT STRUT/  WIDELY PATENT SAPHENOUS VEIN GRAFT AND  LIMA TO LAD GRAFT/ TOTAL OCCLUSION RIGHT CORONARY BEYOND THE POSTERIOR DESCENDING ARTERY BRANCH WITH 50% OSTIAL NARROWING/ TOTAL OCCLUSION OF LAD AT THE OSTIUM OF THE LEFT MAIN   CORONARY ARTERY BYPASS GRAFT  1988   X5   EVALUATION UNDER ANESTHESIA WITH ANAL FISTULECTOMY N/A 07/22/2012   Procedure: EXAM UNDER ANESTHESIA WITH ANAL fistulotomy;  Surgeon: Romie Levee, MD;  Location: [facility];  Service: General;  Laterality: N/A;   LEFT HEART CATH  AND CORS/GRAFTS ANGIOGRAPHY N/A 03/31/2018   Procedure: LEFT HEART CATH AND CORS/GRAFTS ANGIOGRAPHY;  Surgeon: Yvonne Kendall, MD;  Location: [facility];  Service: Cardiovascular;  Laterality: N/A;   SHOULDER ARTHROSCOPY WITH OPEN ROTATOR CUFF REPAIR Right 07-10-1999   TONSILLECTOMY AND ADENOIDECTOMY  1944   TRANSTHORACIC ECHOCARDIOGRAM  03-01-2011  DR Verdis Prime   NORMAL LVF AND LV SIZE/ MILD LEFT ATRIAL ENLARGEMENT/ GRADE II DIASTOLIC DYSFUNCTION WITH ELEVATED LEFT ATRIAL PRESSURE/ MILD TO MODERATE MR/ MILD TR   TRANSURETHRAL RESECTION OF PROSTATE  04/09/2011   Procedure: TRANSURETHRAL RESECTION OF THE PROSTATE WITH GYRUS INSTRUMENTS;  Surgeon: Anner Crete, MD;  Location: [facility];  Service: Urology;  Laterality: N/A;   Family History  Problem Relation Age of Onset   Breast cancer Mother    Stroke Father    Colon cancer Brother    Stroke Brother    Social History   Socioeconomic History   Marital status: Widowed    Spouse name: Delorise Shiner   Number of children: 3   Years of education: B.D.   Highest education level: Not on file  Occupational History   Occupation: Web designer    Comment: Retired  Tobacco Use   Smoking status: Never   Smokeless tobacco: Never  Vaping Use   Vaping Use: Never used  Substance and Sexual Activity   Alcohol use: No   Drug use: No   Sexual activity: Not Currently   Other Topics Concern   Not on file  Social History Narrative   Lives alone, at [place].  Has some help with housecleaning but o/w he is independent.     Was married to first wife 49 years, then to second wife 12 years.  Widowed twice.        Retired Education officer, environmental, [employer]   One son was killed in [place] in 1984   One son and one daughter (she is an Charity fundraiser) still alive        Has living will   Daughter is health care POA   Has DNR    No feeding tube if cognitively unaware   Social Determinants of Corporate investment banker Strain: Not on file  Food Insecurity: Not on file  Transportation Needs: Not on file  Physical Activity: Not on file  Stress: Not on file  Social Connections: Not on file    Tobacco Counseling Counseling given: Not Answered   Clinical Intake:  Pre-visit preparation completed: Yes  Pain : No/denies pain     BMI - recorded: 34 Nutritional Status: BMI > 30  Obese Diabetes: No  How often do you need to have someone help you when you read instructions, pamphlets, or other written materials from your doctor or pharmacy?: 1 - Never  Diabetic?no         Activities of Daily Living    06/06/2022    2:59 PM  In your present state of health, do you have any difficulty performing the following activities:  Hearing? 1  Comment wears hearing aides  Vision? 0  Difficulty concentrating or making decisions? 0  Walking or climbing stairs? 0  Dressing or bathing? 0  Doing errands, shopping? 0  Preparing Food and eating ? N  Using the Toilet? N  In the past six months, have you accidently leaked urine? Y  Do you have problems with loss of bowel control? N  Managing your Medications? N  Managing your Finances? N  Comment daughter helps  Housekeeping  or managing your Housekeeping? N    Patient Care Team: [name], MD as PCP - General (Family Medicine) End, [name], MD as PCP - Cardiology (Cardiology) [name], MD  as Referring Physician (Ophthalmology) [name], NP as Nurse Practitioner (Geriatric Medicine)  Indicate any recent Medical Services you may have received from other than [facility] providers in the past year (date may be approximate).     Assessment:   This is a routine wellness examination for [place].  Hearing/Vision screen No results found.  Dietary issues and exercise activities discussed: Current Exercise Habits: Home exercise routine, Type of exercise: walking, Time (Minutes): 60, Frequency (Times/Week): 5, Weekly Exercise (Minutes/Week): 300   Goals Addressed   None    Depression Screen    03/10/2019    7:18 PM 04/13/2018   12:58 PM 02/19/2018    1:31 PM 02/18/2017   12:22 PM 11/02/2015    2:55 PM  PHQ 2/9 Scores  PHQ - 2 Score 0 0 0 0 1  PHQ- 9 Score  0 0 0     Fall Risk    03/10/2019    7:17 PM 04/13/2018   12:58 PM 02/19/2018    1:31 PM 02/18/2017   12:22 PM 11/02/2015    2:55 PM  Fall Risk   Falls in the past year? 0 0 0 No Yes    FALL RISK PREVENTION PERTAINING TO THE HOME:  Any stairs in or around the home? No  If so, are there any without handrails?  na Home free of loose throw rugs in walkways, pet beds, electrical cords, etc? Yes  Adequate lighting in your home to reduce risk of falls? Yes   ASSISTIVE DEVICES UTILIZED TO PREVENT FALLS:  Life alert? Yes  Use of a cane, walker or w/c? No  Grab bars in the bathroom? Yes  Shower chair or bench in shower? Yes  Elevated toilet seat or a handicapped toilet? Yes   TIMED UP AND GO:  Was the test performed? No .    Cognitive Function:    02/19/2018    1:31 PM 02/18/2017   12:23 PM  MMSE - Mini Mental State Exam  Orientation to time 5 5  Orientation to Place 5 5  Registration 3 3  Attention/ Calculation 0 0  Recall 3 3  Language- name 2 objects 0 0  Language- repeat 1 1  Language- follow 3 step command 3 3  Language- read & follow direction 0 0  Write a sentence 0 0  Copy design 0 0  Total score 20  20        Immunizations Immunization History  Administered Date(s) Administered   Covid-19, Mrna,Vaccine(Spikevax)72yrs and older 05/21/2022   Influenza, High Dose Seasonal PF 12/13/2013, 12/27/2014, 11/02/2015, 11/11/2017   Influenza-Unspecified 11/11/2016, 11/28/2020, 11/28/2021   Moderna Covid-19 Vaccine Bivalent Booster 41yrs & up 12/21/2021   Moderna Sars-Covid-2 Vaccination 02/22/2019, 03/22/2019, 12/24/2019   Pneumococcal Conjugate-13 03/14/2014   Pneumococcal Polysaccharide-23 02/11/2013   Tdap 09/19/2015   Unspecified SARS-COV-2 Vaccination 12/24/2019, 06/30/2020, 11/03/2020, 07/10/2021   Zoster, Live 02/11/2013    TDAP status: Up to date  Flu Vaccine status: Up to date  Pneumococcal vaccine status: Up to date  Covid-19 vaccine status: Information provided on how to obtain vaccines.   Qualifies for Shingles Vaccine? Yes   Zostavax completed No   Shingrix Completed?: No.    Education has been provided regarding the importance of this vaccine. Patient has been advised to call insurance company to determine  out of pocket expense if they have not yet received this vaccine. Advised may also receive vaccine at local pharmacy or Health Dept. Verbalized acceptance and understanding.  Screening Tests Health Maintenance  Topic Date Due   Zoster Vaccines- Shingrix (1 of 2) Never done   COVID-19 Vaccine (10 - 2023-24 season) 07/16/2022   INFLUENZA VACCINE  09/12/2022   Medicare Annual Wellness (AWV)  06/06/2023   DTaP/Tdap/Td (2 - Td or Tdap) 09/18/2025   Pneumonia Vaccine 14+ Years old  Completed   HPV VACCINES  Aged Out    Health Maintenance  Health Maintenance Due  Topic Date Due   Zoster Vaccines- Shingrix (1 of 2) Never done    Colorectal cancer screening: No longer required.   Lung Cancer Screening: (Low Dose CT Chest recommended if Age 52-80 years, 30 pack-year currently smoking OR have quit w/in 15years.) does not qualify.   Lung Cancer Screening Referral:  na  Additional Screening:  Hepatitis C Screening: does not qualify; Completed na  Vision Screening: Recommended annual ophthalmology exams for early detection of glaucoma and other disorders of the eye. Is the patient up to date with their annual eye exam?  No  Who is the provider or what is the name of the office in which the patient attends annual eye exams? [facility]  If pt is not established with a provider, would they like to be referred to a provider to establish care? No .   Dental Screening: Recommended annual dental exams for proper oral hygiene  Community Resource Referral / Chronic Care Management: CRR required this visit?  No   CCM required this visit?   No     Plan:     I have personally reviewed and noted the following in the patient's chart:   Medical and social history Use of alcohol, tobacco or illicit drugs  Current medications and supplements including opioid prescriptions. Patient is not currently taking opioid prescriptions. Functional ability and status Nutritional status Physical activity Advanced directives List of other physicians Hospitalizations, surgeries, and ER visits in previous 12 months Vitals Screenings to include cognitive, depression, and falls Referrals and appointments  In addition, I have reviewed and discussed with patient certain preventive protocols, quality metrics, and best practice recommendations. A written personalized care plan for preventive services as well as general preventive health recommendations were provided to patient.     [name], NP   06/06/2022   Place of service: [facility]

## 2022-06-06 NOTE — Patient Instructions (Addendum)
  Mr. Hietala , Thank you for taking time to come for your Medicare Wellness Visit. I appreciate your ongoing commitment to your health goals. Please review the following plan we discussed and let me know if I can assist you in the future.   These are the goals we discussed:  Goals      Increase physical activity     Starting 02/19/18, I will continue to exercise for at least 60 minutes 6 days per week.         This is a list of the screening recommended for you and due dates:  Health Maintenance  Topic Date Due   Zoster (Shingles) Vaccine (1 of 2) Never done   COVID-19 Vaccine (7 - 2023-24 season) 10/12/2021   Flu Shot  09/12/2022   Medicare Annual Wellness Visit  06/06/2023   DTaP/Tdap/Td vaccine (2 - Td or Tdap) 09/18/2025   Pneumonia Vaccine  Completed   HPV Vaccine  Aged Out

## 2022-07-01 DIAGNOSIS — I7091 Generalized atherosclerosis: Secondary | ICD-10-CM | POA: Diagnosis not present

## 2022-07-01 DIAGNOSIS — B351 Tinea unguium: Secondary | ICD-10-CM | POA: Diagnosis not present

## 2022-07-18 ENCOUNTER — Non-Acute Institutional Stay: Payer: Medicare Other | Admitting: Nurse Practitioner

## 2022-07-18 ENCOUNTER — Encounter: Payer: Self-pay | Admitting: Nurse Practitioner

## 2022-07-18 DIAGNOSIS — I10 Essential (primary) hypertension: Secondary | ICD-10-CM

## 2022-07-18 DIAGNOSIS — I25118 Atherosclerotic heart disease of native coronary artery with other forms of angina pectoris: Secondary | ICD-10-CM

## 2022-07-18 DIAGNOSIS — R351 Nocturia: Secondary | ICD-10-CM

## 2022-07-18 DIAGNOSIS — I255 Ischemic cardiomyopathy: Secondary | ICD-10-CM | POA: Diagnosis not present

## 2022-07-18 DIAGNOSIS — N1831 Chronic kidney disease, stage 3a: Secondary | ICD-10-CM | POA: Diagnosis not present

## 2022-07-18 DIAGNOSIS — K219 Gastro-esophageal reflux disease without esophagitis: Secondary | ICD-10-CM

## 2022-07-18 DIAGNOSIS — N401 Enlarged prostate with lower urinary tract symptoms: Secondary | ICD-10-CM | POA: Diagnosis not present

## 2022-07-18 DIAGNOSIS — G3184 Mild cognitive impairment, so stated: Secondary | ICD-10-CM | POA: Diagnosis not present

## 2022-07-18 DIAGNOSIS — I5032 Chronic diastolic (congestive) heart failure: Secondary | ICD-10-CM

## 2022-07-18 NOTE — Progress Notes (Signed)
Location:  Other [facility].  Nursing Home Room Number: [name] Place of Service:  ALF [phone]) Abbey Chatters, NP  PCP: [name], MD  Patient Care Team: [name], MD as PCP - General (Family Medicine) End, [name], MD as PCP - Cardiology (Cardiology) [name], MD as Referring Physician (Ophthalmology) [name], NP as Nurse Practitioner (Geriatric Medicine)  Extended Emergency Contact Information Primary Emergency Contact: Kathryne Gin [place] Mobile Phone: [phone] Relation: Daughter  Goals of care: Advanced Directive information    07/18/2022    8:46 AM  Advanced Directives  Does Patient Have a Medical Advance Directive? Yes  Type of Estate agent of [place];Out of facility DNR (pink MOST or yellow form)  Does patient want to make changes to medical advance directive? No - Patient declined  Copy of Healthcare Power of Attorney in Chart? Yes - validated most recent copy scanned in chart (See row information)     Chief Complaint  Patient presents with   Medical Management of Chronic Issues    Medical Management of Chronic Issues.     HPI:  Pt is a 89 y.o. male seen today for medical management of chronic disease. He has no concerns or complaints today. Feels like he is doing well. Reports good quality of life.   He continues to have chest pain occasional that resolves without intervention. No shortness of breath associated with chest pain.  Did not want work up in the past for this or invasion intervention.  He reports chest pains happens very infrequently- once every 4-5 months. Already on imdur  No increase in swelling- has swelling to his ankles. Wore compression hose but they did not help so he stopped.   GERD- controlled on protonix.   BPH- no changes in urinary frequency or flow- will leak urine occasionally.   Past Medical History:  Diagnosis Date   Allergic  rhinitis    Benign prostatic hypertrophy    CAD (coronary artery disease)    a. 1998 CABG x 4(LIMA-LAD, VG-D1-D2, VG-RPL); b. 2000 & 2006 PCI of the LCX/OM2; c. 03/2018 Cath: LM 25d, LAD 100ost/m, 37m/d, D2 100, fills via collats from D3, LCX 90ost/m ISR w/ evidence of stent fx, 51m, OM2 30 ISR, 80/80, RCA 70p, RPDA 60ost, 70, RPAV 100, VG->D1->D2 100p (culprit), LIMA->LAD nl, VG->RPL2 nl. EF 45%-->Med Rx; d. 03/2021 MV: ant/antsept/apical inf/inflat isch, EF 75%.   Duodenal diverticulum 02/11/2001   GERD (gastroesophageal reflux disease)    H/O hiatal hernia 02/11/2001   Heart murmur    a. 03/2018 Echo: EF 55-60%, DD, mild anterolateral HK.  Mild-mod TR. Mild Ca2+ of AoV and MV.   Hepatic lesion 02/11/2002   History of melanoma excision    FACE   Hypercholesteremia    Hypertension    Left carotid stenosis    > 50%  PER DUPLEX  03-28-2011   Mild mitral regurgitation    Perianal fistula    S/P CABG x 4    1988   S/P drug eluting coronary stent placement    POST CABG--  STENTING 2000  AND RESTENTING 2006 FOR IN-STENT STENOSIS   Stroke [facility])    Past Surgical History:  Procedure Laterality Date   CATARACT EXTRACTION W/ INTRAOCULAR LENS  IMPLANT, BILATERAL     CORONARY ANGIOPLASTY WITH STENT PLACEMENT  2000   PCI AND STENTING CIRCUMFLEX   CORONARY ANGIOPLASTY WITH STENT PLACEMENT  04-30-2004  DR Verdis Prime   RE-INSTENT STENOSIS/  DRUG-ELUTING  STENT OF PROXIMAL AND MID CIRCUMFLEX/ PCI MID CIRCUMFLEX THROUGH STENT STRUT/  WIDELY PATENT SAPHENOUS VEIN GRAFT AND  LIMA TO LAD GRAFT/ TOTAL OCCLUSION RIGHT CORONARY BEYOND THE POSTERIOR DESCENDING ARTERY BRANCH WITH 50% OSTIAL NARROWING/ TOTAL OCCLUSION OF LAD AT THE OSTIUM OF THE LEFT MAIN   CORONARY ARTERY BYPASS GRAFT  1988   X5   EVALUATION UNDER ANESTHESIA WITH ANAL FISTULECTOMY N/A 07/22/2012   Procedure: EXAM UNDER ANESTHESIA WITH ANAL fistulotomy;  Surgeon: Romie Levee, MD;  Location: [facility];  Service: General;   Laterality: N/A;   LEFT HEART CATH AND CORS/GRAFTS ANGIOGRAPHY N/A 03/31/2018   Procedure: LEFT HEART CATH AND CORS/GRAFTS ANGIOGRAPHY;  Surgeon: Yvonne Kendall, MD;  Location: [facility] INVASIVE CV LAB;  Service: Cardiovascular;  Laterality: N/A;   SHOULDER ARTHROSCOPY WITH OPEN ROTATOR CUFF REPAIR Right 07-10-1999   TONSILLECTOMY AND ADENOIDECTOMY  1944   TRANSTHORACIC ECHOCARDIOGRAM  03-01-2011  DR Verdis Prime   NORMAL LVF AND LV SIZE/ MILD LEFT ATRIAL ENLARGEMENT/ GRADE II DIASTOLIC DYSFUNCTION WITH ELEVATED LEFT ATRIAL PRESSURE/ MILD TO MODERATE MR/ MILD TR   TRANSURETHRAL RESECTION OF PROSTATE  04/09/2011   Procedure: TRANSURETHRAL RESECTION OF THE PROSTATE WITH GYRUS INSTRUMENTS;  Surgeon: Anner Crete, MD;  Location: [facility];  Service: Urology;  Laterality: N/A;    Allergies  Allergen Reactions   Tape Rash    ONLY USE PAPER TAPE ONLY USE PAPER TAPE   Iodine Rash    Rash when applied to skin   Niacin And Related Hives and Other (See Comments)    FLUSHING itching    Outpatient Encounter Medications as of 07/18/2022  Medication Sig   acetaminophen (TYLENOL) 325 MG tablet Take 650 mg by mouth every 4 (four) hours as needed.   alum & mag hydroxide-simeth (MAALOX/MYLANTA) 200-200-20 MG/5ML suspension Take by mouth every 4 (four) hours as needed for indigestion or heartburn.   amLODipine (NORVASC) 2.5 MG tablet Take 1 tablet (2.5 mg total) by mouth daily for 2 days.   atorvastatin (LIPITOR) 80 MG tablet Take one tablet nightly for high cholesterol   carbamide peroxide (DEBROX) 6.5 % OTIC solution Place 5 drops into both ears as needed.   cetirizine (ZYRTEC) 10 MG chewable tablet Chew 10 mg by mouth as needed for allergies.   clobetasol cream (TEMOVATE) 0.05 % Apply topically 2 (two) times daily.   clopidogrel (PLAVIX) 75 MG tablet Take 75 mg by mouth daily.   cyanocobalamin (,VITAMIN B-12,) 1000 MCG/ML injection Inject 1,000 mcg into the muscle every 30 (thirty) days.    dextromethorphan-guaiFENesin (ROBITUSSIN-DM) 10-100 MG/5ML liquid Take 10 mLs by mouth every 4 (four) hours as needed for cough.   Emollient (EUCERIN EX) Apply to BLE and trunk topically two times daily   furosemide (LASIX) 20 MG tablet Take 20 mg by mouth as needed. For the overnight weight gain of 3 lbs or for 5lb gain in one week   furosemide (LASIX) 80 MG tablet Take 80 mg by mouth daily.   Glucose 15 GM/32ML GEL Take 1 packet by mouth as needed.   hydrocortisone 2.5 % cream Apply 1 Application topically 2 (two) times daily as needed.   isosorbide mononitrate (IMDUR) 30 MG 24 hr tablet Take 30 mg by mouth daily.   isosorbide mononitrate (IMDUR) 60 MG 24 hr tablet Take 60 mg by mouth daily.   ketoconazole (NIZORAL) 2 % cream Apply 1 application topically 2 (two) times daily.   Loteprednol Etabonate 0.2 % EMUL Apply 1 drop to eye  as needed.   magnesium hydroxide (MILK OF MAGNESIA) 400 MG/5ML suspension Take 30 mLs by mouth daily as needed for mild constipation.   metoprolol succinate (TOPROL-XL) 25 MG 24 hr tablet Take 1 tablet (25 mg total) by mouth daily for 2 days. Take with or immediately following a meal.   nitroGLYCERIN (NITROSTAT) 0.4 MG SL tablet PLACE 1 TABLET (0.4 MG TOTAL) UNDER THE TONGUE EVERY 5 (FIVE) MINUTES AS NEEDED FOR CHEST PAIN.   nystatin (MYCOSTATIN/NYSTOP) powder Apply 1 Application topically 2 (two) times daily.   ondansetron (ZOFRAN) 4 MG tablet Take 4 mg by mouth as needed for nausea or vomiting. Up to 3 times a day   OXYGEN 2lpm for dyspnea or SOB   pantoprazole (PROTONIX) 40 MG tablet Take 1 tablet (40 mg total) by mouth 2 (two) times daily for 2 days.   polyethylene glycol (MIRALAX / GLYCOLAX) packet Take 17 g by mouth daily.   potassium chloride SA (KLOR-CON M) 20 MEQ tablet TAKE 1 TABLET BY MOUTH ONCE DAILY  *TAKE WITH FOOD* *DO NOT CRUSH OR CHEW* *MAY DISSOLVE*   tamsulosin (FLOMAX) 0.4 MG CAPS capsule Take 0.4 mg by mouth daily.   No facility-administered  encounter medications on file as of 07/18/2022.    Review of Systems  Constitutional:  Negative for activity change, appetite change, fatigue and unexpected weight change.  HENT:  Negative for congestion and hearing loss.   Eyes: Negative.   Respiratory:  Negative for cough and shortness of breath.   Cardiovascular:  Negative for chest pain, palpitations and leg swelling.  Gastrointestinal:  Negative for abdominal pain, constipation and diarrhea.  Genitourinary:  Negative for difficulty urinating and dysuria.  Musculoskeletal:  Negative for arthralgias and myalgias.  Skin:  Negative for color change and wound.  Neurological:  Negative for dizziness and weakness.  Psychiatric/Behavioral:  Negative for agitation, behavioral problems and confusion.      Immunization History  Administered Date(s) Administered   Covid-19, Mrna,Vaccine(Spikevax)69yrs and older 05/21/2022   Influenza, High Dose Seasonal PF 12/13/2013, 12/27/2014, 11/02/2015, 11/11/2017   Influenza-Unspecified 11/11/2016, 11/28/2020, 11/28/2021   Moderna Covid-19 Vaccine Bivalent Booster 7yrs & up 12/21/2021   Moderna Sars-Covid-2 Vaccination 02/22/2019, 03/22/2019, 12/24/2019   Pneumococcal Conjugate-13 03/14/2014   Pneumococcal Polysaccharide-23 02/11/2013   Tdap 09/19/2015   Unspecified SARS-COV-2 Vaccination 12/24/2019, 06/30/2020, 11/03/2020, 07/10/2021   Zoster, Live 02/11/2013   Pertinent  Health Maintenance Due  Topic Date Due   INFLUENZA VACCINE  09/12/2022      04/01/2018    9:40 AM 04/01/2018    8:00 PM 04/13/2018   12:58 PM 03/10/2019    7:17 PM 12/18/2020    2:40 AM  Fall Risk  Falls in the past year?   0 0   (RETIRED) Patient Fall Risk Level High fall risk Moderate fall risk   Low fall risk   Functional Status Survey:    Vitals:   07/18/22 0838  BP: 122/74  Pulse: 74  Resp: 17  Temp: 97.9 F (36.6 C)  SpO2: 96%  Weight: 200 lb 8 oz (90.9 kg)  Height: 5\' 4"  (1.626 m)   Body mass index is 34.42  kg/m. Physical Exam Constitutional:      General: He is not in acute distress.    Appearance: He is well-developed. He is not diaphoretic.  HENT:     Head: Normocephalic and atraumatic.     Right Ear: External ear normal.     Left Ear: External ear normal.     Mouth/Throat:  Pharynx: No oropharyngeal exudate.  Eyes:     Conjunctiva/sclera: Conjunctivae normal.     Pupils: Pupils are equal, round, and reactive to light.  Cardiovascular:     Rate and Rhythm: Normal rate and regular rhythm.     Heart sounds: Normal heart sounds.  Pulmonary:     Effort: Pulmonary effort is normal.     Breath sounds: Normal breath sounds.  Abdominal:     General: Bowel sounds are normal.     Palpations: Abdomen is soft.  Musculoskeletal:        General: No tenderness.     Cervical back: Normal range of motion and neck supple.     Right lower leg: Edema (trace) present.     Left lower leg: Edema (trace) present.  Skin:    General: Skin is warm and dry.  Neurological:     Mental Status: He is alert and oriented to person, place, and time.     Labs reviewed: Recent Labs    10/11/21 1539 11/16/21 1648 02/27/22 1120 04/15/22 0000  NA 135 132* 135 133*  K 3.6 3.7 4.0 3.8  CL 101 100 99 98*  CO2 26 25 27  24*  GLUCOSE 110* 134* 128*  --   BUN 16 15 11 10   CREATININE 1.32* 1.42* 1.21 1.1  CALCIUM 8.6* 8.4* 8.1* 8.7   Recent Labs    09/03/21 0000 10/11/21 1539 04/15/22 0000  AST  --  20 15  ALT  --  17 11  ALKPHOS 3.2* 90 110  BILITOT  --  0.9  --   PROT  --  6.6  --   ALBUMIN 4.1 3.7 3.8   Recent Labs    10/11/21 1539 02/27/22 1120 04/15/22 0000  WBC 6.8 7.8 6.1  NEUTROABS  --   --  3,709.00  HGB 11.1* 11.8* 11.2*  HCT 34.0* 36.4* 34*  MCV 85.4 85.8  --   PLT 229 267 244   Lab Results  Component Value Date   TSH 4.94 (H) 08/25/2017   Lab Results  Component Value Date   HGBA1C 5.8 (H) 05/27/2013   Lab Results  Component Value Date   CHOL 109 10/11/2021   HDL  34 (L) 10/11/2021   LDLCALC 51 10/11/2021   TRIG 118 10/11/2021   CHOLHDL 3.2 10/11/2021    Significant Diagnostic Results in last 30 days:  No results found.  Assessment/Plan 1. Essential hypertension -Blood pressure well controlled, goal bp <140/90 Continue current medications and dietary modifications follow metabolic panel  2. Coronary artery disease of native artery of native heart with stable angina pectoris (HCC) Stable, occasional chest pain that resolves without medication. Continues on plavix, imdur, metoprolol.   3. Ischemic cardiomyopathy Stable, without worsening LE edema, weight gain or shortness of breath. Continues on lasix with potassium supplement and coreg  4. Chronic heart failure with preserved ejection fraction (HFpEF) (HCC) Euvolemic, continues lasix with coreg.   5. Gastroesophageal reflux disease without esophagitis Stable on protonix  6. MCI (mild cognitive impairment) -stable. no acute changes in cognitive or functional status, continue supportive care. Continues to be very independent.   7. Benign prostatic hyperplasia with nocturia Ongoing and stable, continues on flomax  8. Stage 3a chronic kidney disease (HCC) -Chronic and stable Encourage proper hydration Follow metabolic panel Avoid nephrotoxic meds (NSAIDS)    Roann Merk K. Biagio Borg [facility] [phone]

## 2022-08-03 ENCOUNTER — Emergency Department
Admission: EM | Admit: 2022-08-03 | Discharge: 2022-08-03 | Disposition: A | Discharge status: Home / Self Care | Payer: Medicare Other | Attending: Emergency Medicine | Admitting: Emergency Medicine

## 2022-08-03 ENCOUNTER — Other Ambulatory Visit: Payer: Self-pay

## 2022-08-03 DIAGNOSIS — E860 Dehydration: Secondary | ICD-10-CM

## 2022-08-03 DIAGNOSIS — R109 Unspecified abdominal pain: Secondary | ICD-10-CM

## 2022-08-03 DIAGNOSIS — R1084 Generalized abdominal pain: Secondary | ICD-10-CM | POA: Diagnosis not present

## 2022-08-03 LAB — CBC
HCT: 33.1 % — ABNORMAL LOW (ref 39.0–52.0)
Hemoglobin: 10.7 g/dL — ABNORMAL LOW (ref 13.0–17.0)
MCH: 27.4 pg (ref 26.0–34.0)
MCHC: 32.3 g/dL (ref 30.0–36.0)
MCV: 84.9 fL (ref 80.0–100.0)
Platelets: 228 10*3/uL (ref 150–400)
RBC: 3.9 MIL/uL — ABNORMAL LOW (ref 4.22–5.81)
RDW: 15.2 % (ref 11.5–15.5)
WBC: 5.8 10*3/uL (ref 4.0–10.5)
nRBC: 0 % (ref 0.0–0.2)

## 2022-08-03 LAB — URINALYSIS, ROUTINE W REFLEX MICROSCOPIC
Bilirubin Urine: NEGATIVE
Glucose, UA: NEGATIVE mg/dL
Hgb urine dipstick: NEGATIVE
Ketones, ur: NEGATIVE mg/dL
Leukocytes,Ua: NEGATIVE
Nitrite: NEGATIVE
Protein, ur: NEGATIVE mg/dL
Specific Gravity, Urine: 1.008 (ref 1.005–1.030)
pH: 5 (ref 5.0–8.0)

## 2022-08-03 LAB — COMPREHENSIVE METABOLIC PANEL
ALT: 16 U/L (ref 0–44)
AST: 21 U/L (ref 15–41)
Albumin: 3.6 g/dL (ref 3.5–5.0)
Alkaline Phosphatase: 114 U/L (ref 38–126)
Anion gap: 9 (ref 5–15)
BUN: 16 mg/dL (ref 8–23)
CO2: 22 mmol/L (ref 22–32)
Calcium: 7.9 mg/dL — ABNORMAL LOW (ref 8.9–10.3)
Chloride: 97 mmol/L — ABNORMAL LOW (ref 98–111)
Creatinine, Ser: 1.31 mg/dL — ABNORMAL HIGH (ref 0.61–1.24)
GFR, Estimated: 51 mL/min — ABNORMAL LOW (ref >60)
Glucose, Bld: 135 mg/dL — ABNORMAL HIGH (ref 70–99)
Potassium: 3.6 mmol/L (ref 3.5–5.1)
Sodium: 128 mmol/L — ABNORMAL LOW (ref 135–145)
Total Bilirubin: 0.8 mg/dL (ref 0.3–1.2)
Total Protein: 6.3 g/dL — ABNORMAL LOW (ref 6.5–8.1)

## 2022-08-03 LAB — LIPASE, BLOOD: Lipase: 34 U/L (ref 11–51)

## 2022-08-03 MED ORDER — SODIUM CHLORIDE 0.9 % IV BOLUS
500.0000 mL | Freq: Once | INTRAVENOUS | Status: AC
  Administered 2022-08-03: 500 mL via INTRAVENOUS

## 2022-08-03 NOTE — Discharge Instructions (Signed)
Please seek medical attention for any high fevers, chest pain, shortness of breath, change in behavior, persistent vomiting, bloody stool or any other new or concerning symptoms.  

## 2022-08-03 NOTE — ED Provider Notes (Signed)
[facility] Provider Note    Event Date/Time   First MD Initiated Contact with Patient 08/03/22 1517     (approximate)   History   Abdominal Pain   HPI  Jason Velazquez is a 89 y.o. male  who presents to the emergency department today after an episode of abdominal pain. For the past roughly 1-2 years the patient has noticed an occasional protuberance in his left upper abdomen. Today he noticed it again but is was associated with pain. Lasted about 45 minutes and at the time of my exam the patient is pain free. Denies any associated nausea. Chronic constipation but denies any change in bowel movements recently. No fevers.       Physical Exam   Triage Vital Signs: ED Triage Vitals [08/03/22 1511]  Enc Vitals Group     BP 137/70     Pulse Rate 77     Resp 15     Temp      Temp src      SpO2 99 %     Weight 200 lb 9.9 oz (91 kg)     Height 5\' 4"  (1.626 m)     Head Circumference      Peak Flow      Pain Score 0     Pain Loc      Pain Edu?      Excl. in GC?     Most recent vital signs: Vitals:   08/03/22 1511  BP: 137/70  Pulse: 77  Resp: 15  SpO2: 99%   General: Awake, alert, oriented. CV:  Good peripheral perfusion. Regular rate and rhythm. Resp:  Normal effort. Lungs clear. Abd:  No distention. Non tender. No mass.   ED Results / Procedures / Treatments   Labs (all labs ordered are listed, but only abnormal results are displayed) Labs Reviewed  COMPREHENSIVE METABOLIC PANEL - Abnormal; Notable for the following components:      Result Value   Sodium 128 (*)    Chloride 97 (*)    Glucose, Bld 135 (*)    Creatinine, Ser 1.31 (*)    Calcium 7.9 (*)    Total Protein 6.3 (*)    GFR, Estimated 51 (*)    All other components within normal limits  CBC - Abnormal; Notable for the following components:   RBC 3.90 (*)    Hemoglobin 10.7 (*)    HCT 33.1 (*)    All other components within normal limits  LIPASE, BLOOD  URINALYSIS,  ROUTINE W REFLEX MICROSCOPIC     EKG  I, Phineas Semen, attending physician, personally viewed and interpreted this EKG  EKG Time: 1516 Rate: 75 Rhythm: sinus rhythm Axis: normal Intervals: qtc 433 QRS: narrow ST changes: no st elevation Impression: abnormal ekg  RADIOLOGY None   PROCEDURES:  Critical Care performed: No  MEDICATIONS ORDERED IN ED: Medications - No data to display   IMPRESSION / MDM / ASSESSMENT AND PLAN / ED COURSE  I reviewed the triage vital signs and the nursing notes.                              Differential diagnosis includes, but is not limited to, hernia, GERD, pancreatitis, hepatitis.  Patient's presentation is most consistent with acute presentation with potential threat to life or bodily function.   Patient presented to the emergency department today because of concern for abdominal pain which is no  longer present at the time of my exam. Abdominal exam is benign. Blood work without concerning leukocytosis, lipase elevation. Does raise concern for possible dehydration with hyponatremia and slight elevation of creatinine. Discussed this with the patient. Will plan on giving IVFs here and discussed importance of PCP follow up for blood level recheck. Will also give surgery follow up for likely hernia.      FINAL CLINICAL IMPRESSION(S) / ED DIAGNOSES   Final diagnoses:  Abdominal pain, unspecified abdominal location  Dehydration     Note:  This document was prepared using Dragon voice recognition software and may include unintentional dictation errors.    Phineas Semen, MD 08/03/22 [phone]

## 2022-08-03 NOTE — ED Notes (Signed)
EDP at bedside  

## 2022-08-03 NOTE — ED Notes (Signed)
Pt sitting now in chair with blanket on lap. Provided water.

## 2022-08-03 NOTE — ED Triage Notes (Signed)
Pt to ED from [facility]. Pt had sharp LUQ abdominal pain this morning after breakfast at 8am. Pt has no abdominal pain or chest pain at this time and no other complaints. Hx CABG and constipation, takes miralax. Skin warm and dry.

## 2022-08-07 ENCOUNTER — Non-Acute Institutional Stay: Payer: Medicare Other | Admitting: Student

## 2022-08-07 ENCOUNTER — Encounter: Payer: Self-pay | Admitting: Student

## 2022-08-07 DIAGNOSIS — E871 Hypo-osmolality and hyponatremia: Secondary | ICD-10-CM

## 2022-08-07 DIAGNOSIS — N1831 Chronic kidney disease, stage 3a: Secondary | ICD-10-CM

## 2022-08-07 DIAGNOSIS — I25118 Atherosclerotic heart disease of native coronary artery with other forms of angina pectoris: Secondary | ICD-10-CM

## 2022-08-07 DIAGNOSIS — I10 Essential (primary) hypertension: Secondary | ICD-10-CM

## 2022-08-07 DIAGNOSIS — G3184 Mild cognitive impairment, so stated: Secondary | ICD-10-CM

## 2022-08-07 DIAGNOSIS — K439 Ventral hernia without obstruction or gangrene: Secondary | ICD-10-CM | POA: Diagnosis not present

## 2022-08-07 NOTE — Progress Notes (Signed)
Location:  Other [facility].  Nursing Home Room Number: [name] Place of Service:  ALF [phone]) Provider:  [name], MD  Patient Care Team: [name], MD as PCP - General (Family Medicine) End, [name], MD as PCP - Cardiology (Cardiology) [name], MD as Referring Physician (Ophthalmology) [name], NP as Nurse Practitioner (Geriatric Medicine)  Extended Emergency Contact Information Primary Emergency Contact: Kathryne Gin [place] Mobile Phone: [phone] Relation: Daughter  Code Status:  DNR Goals of care: Advanced Directive information    08/07/2022    2:45 PM  Advanced Directives  Does Patient Have a Medical Advance Directive? Yes  Type of Estate agent of [place];Out of facility DNR (pink MOST or yellow form)  Does patient want to make changes to medical advance directive? No - Patient declined  Copy of Healthcare Power of Attorney in Chart? Yes - validated most recent copy scanned in chart (See row information)     Chief Complaint  Patient presents with   Hospitalization Follow-up    ER Follow up    HPI:  Pt is a 89 y.o. male seen today for ER Follow up  He went to the emergency room because it was bothering him. Unclear if he would lke surgery. Wants to know risk and benefit as well as whether or not he should do it at his age.   He wants to know what to look for if there is a problem.   He denies pain at this time, nausea, vomiting, changes in stool patterns. No skin changes either.   His appetite has been normal. Denies fevers or chillls.   He doesn't always add salt to his food.   Denies leg swelling, shortness of breath, or chest pain.   When he gets up he drinks water and takes miralax. Some water after going on the porch. Sweet tea at lunch and dinner 2 cups of coffee at breakfast. 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 [place] to [place] then to  [place].   Spoke with his daughter regarding the hernia. She was reassured that he has normal bowel movements. He has been functional and no problems. Agrees with "sit tight" as long as there are no problems.    Past Medical History:  Diagnosis Date   Allergic rhinitis    Benign prostatic hypertrophy    CAD (coronary artery disease)    a. 1998 CABG x 4(LIMA-LAD, VG-D1-D2, VG-RPL); b. 2000 & 2006 PCI of the LCX/OM2; c. 03/2018 Cath: LM 25d, LAD 100ost/m, 26m/d, D2 100, fills via collats from D3, LCX 90ost/m ISR w/ evidence of stent fx, 29m, OM2 30 ISR, 80/80, RCA 70p, RPDA 60ost, 70, RPAV 100, VG->D1->D2 100p (culprit), LIMA->LAD nl, VG->RPL2 nl. EF 45%-->Med Rx; d. 03/2021 MV: ant/antsept/apical inf/inflat isch, EF 75%.   Duodenal diverticulum 02/11/2001   GERD (gastroesophageal reflux disease)    H/O hiatal hernia 02/11/2001   Heart murmur    a. 03/2018 Echo: EF 55-60%, DD, mild anterolateral HK.  Mild-mod TR. Mild Ca2+ of AoV and MV.   Hepatic lesion 02/11/2002   History of melanoma excision    FACE   Hypercholesteremia    Hypertension    Left carotid stenosis    > 50%  PER DUPLEX  03-28-2011   Mild mitral regurgitation    Perianal fistula    S/P CABG x 4    1988   S/P drug eluting coronary stent placement  POST CABG--  STENTING 2000  AND RESTENTING 2006 FOR IN-STENT STENOSIS   Stroke [facility])    Past Surgical History:  Procedure Laterality Date   CATARACT EXTRACTION W/ INTRAOCULAR LENS  IMPLANT, BILATERAL     CORONARY ANGIOPLASTY WITH STENT PLACEMENT  2000   PCI AND STENTING CIRCUMFLEX   CORONARY ANGIOPLASTY WITH STENT PLACEMENT  04-30-2004  DR Verdis Prime   RE-INSTENT STENOSIS/  DRUG-ELUTING STENT OF PROXIMAL AND MID CIRCUMFLEX/ PCI MID CIRCUMFLEX THROUGH STENT STRUT/  WIDELY PATENT SAPHENOUS VEIN GRAFT AND  LIMA TO LAD GRAFT/ TOTAL OCCLUSION RIGHT CORONARY BEYOND THE POSTERIOR DESCENDING ARTERY BRANCH WITH 50% OSTIAL NARROWING/ TOTAL OCCLUSION OF LAD AT THE OSTIUM OF  THE LEFT MAIN   CORONARY ARTERY BYPASS GRAFT  1988   X5   EVALUATION UNDER ANESTHESIA WITH ANAL FISTULECTOMY N/A 07/22/2012   Procedure: EXAM UNDER ANESTHESIA WITH ANAL fistulotomy;  Surgeon: Romie Levee, MD;  Location: [facility];  Service: General;  Laterality: N/A;   LEFT HEART CATH AND CORS/GRAFTS ANGIOGRAPHY N/A 03/31/2018   Procedure: LEFT HEART CATH AND CORS/GRAFTS ANGIOGRAPHY;  Surgeon: Yvonne Kendall, MD;  Location: [facility] INVASIVE CV LAB;  Service: Cardiovascular;  Laterality: N/A;   SHOULDER ARTHROSCOPY WITH OPEN ROTATOR CUFF REPAIR Right 07-10-1999   TONSILLECTOMY AND ADENOIDECTOMY  1944   TRANSTHORACIC ECHOCARDIOGRAM  03-01-2011  DR Verdis Prime   NORMAL LVF AND LV SIZE/ MILD LEFT ATRIAL ENLARGEMENT/ GRADE II DIASTOLIC DYSFUNCTION WITH ELEVATED LEFT ATRIAL PRESSURE/ MILD TO MODERATE MR/ MILD TR   TRANSURETHRAL RESECTION OF PROSTATE  04/09/2011   Procedure: TRANSURETHRAL RESECTION OF THE PROSTATE WITH GYRUS INSTRUMENTS;  Surgeon: Anner Crete, MD;  Location: [facility];  Service: Urology;  Laterality: N/A;    Allergies  Allergen Reactions   Tape Rash    ONLY USE PAPER TAPE ONLY USE PAPER TAPE   Iodine Rash    Rash when applied to skin   Niacin And Related Hives and Other (See Comments)    FLUSHING itching    Outpatient Encounter Medications as of 08/07/2022  Medication Sig   acetaminophen (TYLENOL) 325 MG tablet Take 650 mg by mouth every 4 (four) hours as needed.   alum & mag hydroxide-simeth (MAALOX/MYLANTA) 200-200-20 MG/5ML suspension Take by mouth every 4 (four) hours as needed for indigestion or heartburn.   amLODipine (NORVASC) 2.5 MG tablet Take 1 tablet (2.5 mg total) by mouth daily for 2 days.   atorvastatin (LIPITOR) 80 MG tablet Take one tablet nightly for high cholesterol   carbamide peroxide (DEBROX) 6.5 % OTIC solution Place 5 drops into both ears as needed.   cetirizine (ZYRTEC) 10 MG chewable tablet Chew 10 mg by mouth as needed for allergies.    clobetasol cream (TEMOVATE) 0.05 % Apply topically 2 (two) times daily.   clopidogrel (PLAVIX) 75 MG tablet Take 75 mg by mouth daily.   cyanocobalamin (,VITAMIN B-12,) 1000 MCG/ML injection Inject 1,000 mcg into the muscle every 30 (thirty) days.   dextromethorphan-guaiFENesin (ROBITUSSIN-DM) 10-100 MG/5ML liquid Take 10 mLs by mouth every 4 (four) hours as needed for cough.   Emollient (EUCERIN EX) Apply to BLE and trunk topically two times daily   furosemide (LASIX) 20 MG tablet Take 20 mg by mouth as needed. For the overnight weight gain of 3 lbs or for 5lb gain in one week   furosemide (LASIX) 80 MG tablet Take 80 mg by mouth daily.   Glucose 15 GM/32ML GEL Take 1 packet by mouth as needed.   hydrocortisone  2.5 % cream Apply 1 Application topically 2 (two) times daily as needed.   isosorbide mononitrate (IMDUR) 30 MG 24 hr tablet Take 30 mg by mouth daily.   isosorbide mononitrate (IMDUR) 60 MG 24 hr tablet Take 60 mg by mouth daily.   ketoconazole (NIZORAL) 2 % cream Apply 1 application topically 2 (two) times daily.   Loteprednol Etabonate 0.2 % EMUL Apply 1 drop to eye as needed.   magnesium hydroxide (MILK OF MAGNESIA) 400 MG/5ML suspension Take 30 mLs by mouth daily as needed for mild constipation.   metoprolol succinate (TOPROL-XL) 25 MG 24 hr tablet Take 1 tablet (25 mg total) by mouth daily for 2 days. Take with or immediately following a meal.   nitroGLYCERIN (NITROSTAT) 0.4 MG SL tablet PLACE 1 TABLET (0.4 MG TOTAL) UNDER THE TONGUE EVERY 5 (FIVE) MINUTES AS NEEDED FOR CHEST PAIN.   nystatin (MYCOSTATIN/NYSTOP) powder Apply 1 Application topically 2 (two) times daily.   ondansetron (ZOFRAN) 4 MG tablet Take 4 mg by mouth as needed for nausea or vomiting. Up to 3 times a day   OXYGEN 2lpm for dyspnea or SOB   pantoprazole (PROTONIX) 40 MG tablet Take 1 tablet (40 mg total) by mouth 2 (two) times daily for 2 days.   polyethylene glycol (MIRALAX / GLYCOLAX) packet Take 17 g by  mouth daily.   potassium chloride SA (KLOR-CON M) 20 MEQ tablet TAKE 1 TABLET BY MOUTH ONCE DAILY  *TAKE WITH FOOD* *DO NOT CRUSH OR CHEW* *MAY DISSOLVE*   tamsulosin (FLOMAX) 0.4 MG CAPS capsule Take 0.4 mg by mouth daily.   No facility-administered encounter medications on file as of 08/07/2022.    Review of Systems  Immunization History  Administered Date(s) Administered   Covid-19, Mrna,Vaccine(Spikevax)27yrs and older 05/21/2022   Influenza, High Dose Seasonal PF 12/13/2013, 12/27/2014, 11/02/2015, 11/11/2017   Influenza-Unspecified 11/11/2016, 11/28/2020, 11/28/2021   Moderna Covid-19 Vaccine Bivalent Booster 7yrs & up 12/21/2021   Moderna Sars-Covid-2 Vaccination 02/22/2019, 03/22/2019, 12/24/2019   Pneumococcal Conjugate-13 03/14/2014   Pneumococcal Polysaccharide-23 02/11/2013   Tdap 09/19/2015   Unspecified SARS-COV-2 Vaccination 12/24/2019, 06/30/2020, 11/03/2020, 07/10/2021   Zoster Recombinat (Shingrix) 06/12/2022   Zoster, Live 02/11/2013   Pertinent  Health Maintenance Due  Topic Date Due   INFLUENZA VACCINE  09/12/2022      04/01/2018    9:40 AM 04/01/2018    8:00 PM 04/13/2018   12:58 PM 03/10/2019    7:17 PM 12/18/2020    2:40 AM  Fall Risk  Falls in the past year?   0 0   (RETIRED) Patient Fall Risk Level High fall risk Moderate fall risk   Low fall risk   Functional Status Survey:    Vitals:   08/07/22 1435  BP: 123/72  Pulse: 74  Resp: 17  Temp: (!) 97.4 F (36.3 C)  SpO2: 95%  Weight: 198 lb (89.8 kg)  Height: 5\' 4"  (1.626 m)   Body mass index is 33.99 kg/m. Physical Exam Vitals reviewed.  Constitutional:      Appearance: Normal appearance.  Cardiovascular:     Rate and Rhythm: Normal rate. Rhythm irregular.     Pulses: Normal pulses.  Pulmonary:     Effort: Pulmonary effort is normal.     Breath sounds: Normal breath sounds.  Abdominal:     General: Abdomen is flat.     Palpations: Abdomen is soft.     Comments: 1.5cm hernia present  with valsalva, reducible on exam. No skin changes. Non-tender  Skin:    General: Skin is warm and dry.  Neurological:     Mental Status: He is alert and oriented to person, place, and time.     Labs reviewed: Recent Labs    11/16/21 1648 02/27/22 1120 04/15/22 0000 08/03/22 1551  NA 132* 135 133* 128*  K 3.7 4.0 3.8 3.6  CL 100 99 98* 97*  CO2 25 27 24* 22  GLUCOSE 134* 128*  --  135*  BUN 15 11 10 16   CREATININE 1.42* 1.21 1.1 1.31*  CALCIUM 8.4* 8.1* 8.7 7.9*   Recent Labs    10/11/21 1539 04/15/22 0000 08/03/22 1551  AST 20 15 21   ALT 17 11 16   ALKPHOS 90 110 114  BILITOT 0.9  --  0.8  PROT 6.6  --  6.3*  ALBUMIN 3.7 3.8 3.6   Recent Labs    10/11/21 1539 02/27/22 1120 04/15/22 0000 08/03/22 1551  WBC 6.8 7.8 6.1 5.8  NEUTROABS  --   --  3,709.00  --   HGB 11.1* 11.8* 11.2* 10.7*  HCT 34.0* 36.4* 34* 33.1*  MCV 85.4 85.8  --  84.9  PLT 229 267 244 228   Lab Results  Component Value Date   TSH 4.94 (H) 08/25/2017   Lab Results  Component Value Date   HGBA1C 5.8 (H) 05/27/2013   Lab Results  Component Value Date   CHOL 109 10/11/2021   HDL 34 (L) 10/11/2021   LDLCALC 51 10/11/2021   TRIG 118 10/11/2021   CHOLHDL 3.2 10/11/2021    Significant Diagnostic Results in last 30 days:  No results found.  Assessment/Plan Ventral hernia without obstruction or gangrene  Hyponatremia  Essential hypertension  Coronary artery disease of native artery of native heart with stable angina pectoris (HCC)  MCI (mild cognitive impairment)  Stage 3a chronic kidney disease [facility]) Patient visited the ED for abdominal pain with notable hernia. Discussion with patient and family -- they would prefer to monitor for now given his age, risk of surgery, and current stable status. No pain, reducible, no signs of incarceration at this time. Hyponatremia noted on labs, will repeat on Monday. Discussed hydration and sodium intake. BP within normal range. Denies chest  pain at this time. Remains in AL for supportive care. BMP and Mg in 1 week.   Family/ staff Communication: nursing, Becky  Labs/tests ordered:  BMP and Mg   I spent greater than 30 minutes for the care of this patient in face to face time, chart review, clinical documentation, patient education.

## 2022-08-12 DIAGNOSIS — I2510 Atherosclerotic heart disease of native coronary artery without angina pectoris: Secondary | ICD-10-CM | POA: Diagnosis not present

## 2022-08-12 DIAGNOSIS — I10 Essential (primary) hypertension: Secondary | ICD-10-CM | POA: Diagnosis not present

## 2022-08-12 LAB — COMPREHENSIVE METABOLIC PANEL WITH GFR
Calcium: 8.7 (ref 8.7–10.7)
eGFR: 58

## 2022-08-12 LAB — BASIC METABOLIC PANEL WITH GFR
BUN: 10 (ref 4–21)
CO2: 24 — AB (ref 13–22)
Chloride: 98 — AB (ref 99–108)
Creatinine: 1.2 (ref 0.6–1.3)
Glucose: 100
Potassium: 4 meq/L (ref 3.5–5.1)
Sodium: 134 — AB (ref 137–147)

## 2022-09-12 ENCOUNTER — Telehealth (INDEPENDENT_AMBULATORY_CARE_PROVIDER_SITE_OTHER): Payer: Medicare Other | Admitting: Nurse Practitioner

## 2022-09-12 ENCOUNTER — Encounter: Payer: Self-pay | Admitting: Nurse Practitioner

## 2022-09-12 VITALS — BP 101/60 | HR 78 | Temp 98.3°F | Resp 20

## 2022-09-12 DIAGNOSIS — U071 COVID-19: Secondary | ICD-10-CM

## 2022-09-12 MED ORDER — DM-GUAIFENESIN ER 30-600 MG PO TB12: 1.0000 | ORAL_TABLET | Freq: Two times a day (BID) | ORAL | 0 refills | Status: DC | PRN

## 2022-09-12 NOTE — Progress Notes (Signed)
Careteam: Patient Care Team: [name], MD as PCP - General (Family Medicine) End, [name], MD as PCP - Cardiology (Cardiology) [name], MD as Referring Physician (Ophthalmology) [name], NP as Nurse Practitioner (Geriatric Medicine)  Advanced Directive information Does Patient Have a Medical Advance Directive?: Yes, Does patient want to make changes to medical advance directive?: No - Patient declined  Allergies  Allergen Reactions   Tape Rash    ONLY USE PAPER TAPE ONLY USE PAPER TAPE   Iodine Rash    Rash when applied to skin   Niacin And Related Hives and Other (See Comments)    FLUSHING itching    Chief Complaint  Patient presents with   Acute Visit    Positive Covid.      HPI: Patient is a 89 y.o. male for positive COVID.  Family requesting antiviral.  Pt with hx of CAD, htn, hyperlipidemia, GERD, BPH.  He lives at [facility].  He was coughing this morning and yesterday reports that yesterday he was fatigued.  No chest congestion.  He has some nasal congestion.  He felt worse yesterday but better today Denies weakness.  Got his self dressed this morning.    Review of Systems:  Review of Systems  Constitutional:  Positive for malaise/fatigue. Negative for chills, fever and weight loss.  HENT:  Negative for tinnitus.   Respiratory:  Negative for cough, sputum production and shortness of breath.   Cardiovascular:  Negative for chest pain, palpitations and leg swelling.  Gastrointestinal:  Negative for abdominal pain, constipation, diarrhea and heartburn.  Genitourinary:  Negative for dysuria, frequency and urgency.  Musculoskeletal:  Negative for back pain, falls, joint pain and myalgias.  Skin: Negative.   Neurological:  Negative for dizziness and headaches.  Psychiatric/Behavioral:  Negative for depression and memory loss. The patient does not have insomnia.     Past Medical History:  Diagnosis Date    Allergic rhinitis    Benign prostatic hypertrophy    CAD (coronary artery disease)    a. 1998 CABG x 4(LIMA-LAD, VG-D1-D2, VG-RPL); b. 2000 & 2006 PCI of the LCX/OM2; c. 03/2018 Cath: LM 25d, LAD 100ost/m, 30m/d, D2 100, fills via collats from D3, LCX 90ost/m ISR w/ evidence of stent fx, 30m, OM2 30 ISR, 80/80, RCA 70p, RPDA 60ost, 70, RPAV 100, VG->D1->D2 100p (culprit), LIMA->LAD nl, VG->RPL2 nl. EF 45%-->Med Rx; d. 03/2021 MV: ant/antsept/apical inf/inflat isch, EF 75%.   Duodenal diverticulum 02/11/2001   GERD (gastroesophageal reflux disease)    H/O hiatal hernia 02/11/2001   Heart murmur    a. 03/2018 Echo: EF 55-60%, DD, mild anterolateral HK.  Mild-mod TR. Mild Ca2+ of AoV and MV.   Hepatic lesion 02/11/2002   History of melanoma excision    FACE   Hypercholesteremia    Hypertension    Left carotid stenosis    > 50%  PER DUPLEX  03-28-2011   Mild mitral regurgitation    Perianal fistula    S/P CABG x 4    1988   S/P drug eluting coronary stent placement    POST CABG--  STENTING 2000  AND RESTENTING 2006 FOR IN-STENT STENOSIS   Stroke [facility])    Past Surgical History:  Procedure Laterality Date   CATARACT EXTRACTION W/ INTRAOCULAR LENS  IMPLANT, BILATERAL     CORONARY ANGIOPLASTY WITH STENT PLACEMENT  2000   PCI AND STENTING CIRCUMFLEX   CORONARY ANGIOPLASTY WITH STENT PLACEMENT  04-30-2004  DR Verdis Prime  RE-INSTENT STENOSIS/  DRUG-ELUTING STENT OF PROXIMAL AND MID CIRCUMFLEX/ PCI MID CIRCUMFLEX THROUGH STENT STRUT/  WIDELY PATENT SAPHENOUS VEIN GRAFT AND  LIMA TO LAD GRAFT/ TOTAL OCCLUSION RIGHT CORONARY BEYOND THE POSTERIOR DESCENDING ARTERY BRANCH WITH 50% OSTIAL NARROWING/ TOTAL OCCLUSION OF LAD AT THE OSTIUM OF THE LEFT MAIN   CORONARY ARTERY BYPASS GRAFT  1988   X5   EVALUATION UNDER ANESTHESIA WITH ANAL FISTULECTOMY N/A 07/22/2012   Procedure: EXAM UNDER ANESTHESIA WITH ANAL fistulotomy;  Surgeon: Romie Levee, MD;  Location: [facility];  Service:  General;  Laterality: N/A;   LEFT HEART CATH AND CORS/GRAFTS ANGIOGRAPHY N/A 03/31/2018   Procedure: LEFT HEART CATH AND CORS/GRAFTS ANGIOGRAPHY;  Surgeon: Yvonne [facility], MD;  Location: [facility] INVASIVE CV LAB;  Service: Cardiovascular;  Laterality: N/A;   SHOULDER ARTHROSCOPY WITH OPEN ROTATOR CUFF REPAIR Right 07-10-1999   TONSILLECTOMY AND ADENOIDECTOMY  1944   TRANSTHORACIC ECHOCARDIOGRAM  03-01-2011  DR Verdis Prime   NORMAL LVF AND LV SIZE/ MILD LEFT ATRIAL ENLARGEMENT/ GRADE II DIASTOLIC DYSFUNCTION WITH ELEVATED LEFT ATRIAL PRESSURE/ MILD TO MODERATE MR/ MILD TR   TRANSURETHRAL RESECTION OF PROSTATE  04/09/2011   Procedure: TRANSURETHRAL RESECTION OF THE PROSTATE WITH GYRUS INSTRUMENTS;  Surgeon: Anner Crete, MD;  Location: [facility];  Service: Urology;  Laterality: N/A;   Social History:   reports that he has never smoked. He has never used smokeless tobacco. He reports that he does not drink alcohol and does not use drugs.  Family History  Problem Relation Age of Onset   Breast cancer Mother    Stroke Father    Colon cancer Brother    Stroke Brother     Medications: Patient's Medications  New Prescriptions   DEXTROMETHORPHAN-GUAIFENESIN (MUCINEX DM) 30-600 MG 12HR TABLET    Take 1 tablet by mouth 2 (two) times daily as needed for cough.  Previous Medications   ACETAMINOPHEN (TYLENOL) 325 MG TABLET    Take 650 mg by mouth every 4 (four) hours as needed.   ALUM & MAG HYDROXIDE-SIMETH (MAALOX/MYLANTA) 200-200-20 MG/5ML SUSPENSION    Take by mouth every 4 (four) hours as needed for indigestion or heartburn.   AMLODIPINE (NORVASC) 2.5 MG TABLET    Take 1 tablet (2.5 mg total) by mouth daily for 2 days.   ATORVASTATIN (LIPITOR) 80 MG TABLET    Take one tablet nightly for high cholesterol   CARBAMIDE PEROXIDE (DEBROX) 6.5 % OTIC SOLUTION    Place 5 drops into both ears as needed.   CETIRIZINE (ZYRTEC) 10 MG CHEWABLE TABLET    Chew 10 mg by mouth as needed for allergies.   CLOBETASOL  CREAM (TEMOVATE) 0.05 %    Apply topically 2 (two) times daily.   CLOPIDOGREL (PLAVIX) 75 MG TABLET    Take 75 mg by mouth daily.   CYANOCOBALAMIN (,VITAMIN B-12,) 1000 MCG/ML INJECTION    Inject 1,000 mcg into the muscle every 30 (thirty) days.   DEXTROMETHORPHAN-GUAIFENESIN (ROBITUSSIN-DM) 10-100 MG/5ML LIQUID    Take 10 mLs by mouth every 4 (four) hours as needed for cough.   EMOLLIENT (EUCERIN EX)    Apply to BLE and trunk topically two times daily   FUROSEMIDE (LASIX) 20 MG TABLET    Take 20 mg by mouth as needed. For the overnight weight gain of 3 lbs or for 5lb gain in one week   FUROSEMIDE (LASIX) 80 MG TABLET    Take 80 mg by mouth daily.   GLUCOSE 15 GM/32ML GEL  Take 1 packet by mouth as needed.   HYDROCORTISONE 2.5 % CREAM    Apply 1 Application topically 2 (two) times daily as needed.   ISOSORBIDE MONONITRATE (IMDUR) 30 MG 24 HR TABLET    Take 30 mg by mouth daily.   ISOSORBIDE MONONITRATE (IMDUR) 60 MG 24 HR TABLET    Take 60 mg by mouth daily.   KETOCONAZOLE (NIZORAL) 2 % CREAM    Apply 1 application topically 2 (two) times daily.   LOTEPREDNOL ETABONATE 0.2 % EMUL    Apply 1 drop to eye as needed.   MAGNESIUM HYDROXIDE (MILK OF MAGNESIA) 400 MG/5ML SUSPENSION    Take 30 mLs by mouth daily as needed for mild constipation.   METOPROLOL SUCCINATE (TOPROL-XL) 25 MG 24 HR TABLET    Take 1 tablet (25 mg total) by mouth daily for 2 days. Take with or immediately following a meal.   NITROGLYCERIN (NITROSTAT) 0.4 MG SL TABLET    PLACE 1 TABLET (0.4 MG TOTAL) UNDER THE TONGUE EVERY 5 (FIVE) MINUTES AS NEEDED FOR CHEST PAIN.   NYSTATIN (MYCOSTATIN/NYSTOP) POWDER    Apply 1 Application topically 2 (two) times daily.   ONDANSETRON (ZOFRAN) 4 MG TABLET    Take 4 mg by mouth as needed for nausea or vomiting. Up to 3 times a day   OXYGEN    2lpm for dyspnea or SOB   PANTOPRAZOLE (PROTONIX) 40 MG TABLET    Take 1 tablet (40 mg total) by mouth 2 (two) times daily for 2 days.   POLYETHYLENE GLYCOL  (MIRALAX / GLYCOLAX) PACKET    Take 17 g by mouth daily.   POTASSIUM CHLORIDE SA (KLOR-CON M) 20 MEQ TABLET    TAKE 1 TABLET BY MOUTH ONCE DAILY  *TAKE WITH FOOD* *DO NOT CRUSH OR CHEW* *MAY DISSOLVE*   TAMSULOSIN (FLOMAX) 0.4 MG CAPS CAPSULE    Take 0.4 mg by mouth daily.  Modified Medications   No medications on file  Discontinued Medications   No medications on file    Physical Exam:  Vitals:   09/12/22 1132  BP: 101/60  Pulse: 78  Resp: 20  Temp: 98.3 F (36.8 C)  SpO2: 94%   There is no height or weight on file to calculate BMI. Wt Readings from Last 3 Encounters:  08/07/22 198 lb (89.8 kg)  08/03/22 200 lb 9.9 oz (91 kg)  07/18/22 200 lb 8 oz (90.9 kg)    Physical Exam Constitutional:      Appearance: Normal appearance.  Neurological:     Mental Status: He is alert. Mental status is at baseline.  Psychiatric:        Mood and Affect: Mood normal.     Labs reviewed: Basic Metabolic Panel: Recent Labs    11/16/21 1648 02/27/22 1120 04/15/22 0000 08/03/22 1551  NA 132* 135 133* 128*  K 3.7 4.0 3.8 3.6  CL 100 99 98* 97*  CO2 25 27 24* 22  GLUCOSE 134* 128*  --  135*  BUN 15 11 10 16   CREATININE 1.42* 1.21 1.1 1.31*  CALCIUM 8.4* 8.1* 8.7 7.9*   Liver Function Tests: Recent Labs    10/11/21 1539 04/15/22 0000 08/03/22 1551  AST 20 15 21   ALT 17 11 16   ALKPHOS 90 110 114  BILITOT 0.9  --  0.8  PROT 6.6  --  6.3*  ALBUMIN 3.7 3.8 3.6   Recent Labs    08/03/22 1551  LIPASE 34   No results for  input(s): "AMMONIA" in the last 8760 hours. CBC: Recent Labs    10/11/21 1539 02/27/22 1120 04/15/22 0000 08/03/22 1551  WBC 6.8 7.8 6.1 5.8  NEUTROABS  --   --  3,709.00  --   HGB 11.1* 11.8* 11.2* 10.7*  HCT 34.0* 36.4* 34* 33.1*  MCV 85.4 85.8  --  84.9  PLT 229 267 244 228   Lipid Panel: Recent Labs    10/11/21 1539  CHOL 109  HDL 34*  LDLCALC 51  TRIG 161  CHOLHDL 3.2   TSH: No results for input(s): "TSH" in the last 8760  hours. A1C: Lab Results  Component Value Date   HGBA1C 5.8 (H) 05/27/2013     Assessment/Plan 1. COVID-19 Will continue supportive care at this time, doing better today vs yesterday without significant symptoms at this time.  Plain nasal saline spray throughout the day as needed May use tylenol 325 mg 2 tablets every 6-8 hours as needed aches and pains or sore throat Mucinex DM by mouth twice daily as needed for cough and chest congestion with full glass of water  Keep well hydrated Proper nutrition  Avoid forcefully blowing nose Vit c 1000 mg twice daily for 7 days   Kaion Tisdale K. Biagio Borg  [facility] [phone]    Virtual Visit via mychart video  I connected with patient on 09/12/22 at 11:20 AM EDT by video and verified that I am speaking with the correct person using two identifiers.  Location: Patient: home Provider: clinic   I discussed the limitations, risks, security and privacy concerns of performing an evaluation and management service by telephone and the availability of in person appointments. I also discussed with the patient that there may be a patient responsible charge related to this service. The patient expressed understanding and agreed to proceed.   I discussed the assessment and treatment plan with the patient. The patient was provided an opportunity to ask questions and all were answered. The patient agreed with the plan and demonstrated an understanding of the instructions.   The patient was advised to call back or seek an in-person evaluation if the symptoms worsen or if the condition fails to improve as anticipated.  I provided 20 minutes of non-face-to-face time during this encounter.  Janene Harvey. Biagio Borg Avs printed and mailed

## 2022-10-08 DIAGNOSIS — B351 Tinea unguium: Secondary | ICD-10-CM | POA: Diagnosis not present

## 2022-10-08 DIAGNOSIS — I7091 Generalized atherosclerosis: Secondary | ICD-10-CM | POA: Diagnosis not present

## 2022-10-23 DIAGNOSIS — D3131 Benign neoplasm of right choroid: Secondary | ICD-10-CM | POA: Diagnosis not present

## 2022-10-30 ENCOUNTER — Other Ambulatory Visit: Payer: Self-pay

## 2022-10-30 ENCOUNTER — Non-Acute Institutional Stay: Payer: Medicare Other | Admitting: Student

## 2022-10-30 ENCOUNTER — Emergency Department
Admission: EM | Admit: 2022-10-30 | Discharge: 2022-10-30 | Disposition: A | Discharge status: Home / Self Care | Payer: Medicare Other | Attending: Emergency Medicine | Admitting: Emergency Medicine

## 2022-10-30 ENCOUNTER — Encounter: Payer: Self-pay | Admitting: Student

## 2022-10-30 ENCOUNTER — Emergency Department: Payer: Medicare Other

## 2022-10-30 DIAGNOSIS — Z09 Encounter for follow-up examination after completed treatment for conditions other than malignant neoplasm: Secondary | ICD-10-CM

## 2022-10-30 DIAGNOSIS — R29898 Other symptoms and signs involving the musculoskeletal system: Secondary | ICD-10-CM | POA: Diagnosis not present

## 2022-10-30 DIAGNOSIS — I2510 Atherosclerotic heart disease of native coronary artery without angina pectoris: Secondary | ICD-10-CM | POA: Insufficient documentation

## 2022-10-30 DIAGNOSIS — R0789 Other chest pain: Secondary | ICD-10-CM | POA: Diagnosis not present

## 2022-10-30 DIAGNOSIS — R079 Chest pain, unspecified: Secondary | ICD-10-CM

## 2022-10-30 DIAGNOSIS — M25552 Pain in left hip: Secondary | ICD-10-CM | POA: Diagnosis not present

## 2022-10-30 DIAGNOSIS — I10 Essential (primary) hypertension: Secondary | ICD-10-CM | POA: Insufficient documentation

## 2022-10-30 DIAGNOSIS — T68XXXA Hypothermia, initial encounter: Secondary | ICD-10-CM | POA: Diagnosis not present

## 2022-10-30 DIAGNOSIS — M7989 Other specified soft tissue disorders: Secondary | ICD-10-CM | POA: Insufficient documentation

## 2022-10-30 DIAGNOSIS — R609 Edema, unspecified: Secondary | ICD-10-CM | POA: Diagnosis not present

## 2022-10-30 LAB — CBC
HCT: 35.2 % — ABNORMAL LOW (ref 39.0–52.0)
Hemoglobin: 11.2 g/dL — ABNORMAL LOW (ref 13.0–17.0)
MCH: 27.3 pg (ref 26.0–34.0)
MCHC: 31.8 g/dL (ref 30.0–36.0)
MCV: 85.6 fL (ref 80.0–100.0)
Platelets: 261 10*3/uL (ref 150–400)
RBC: 4.11 MIL/uL — ABNORMAL LOW (ref 4.22–5.81)
RDW: 15.7 % — ABNORMAL HIGH (ref 11.5–15.5)
WBC: 5.9 10*3/uL (ref 4.0–10.5)
nRBC: 0 % (ref 0.0–0.2)

## 2022-10-30 LAB — BASIC METABOLIC PANEL WITH GFR
Anion gap: 8 (ref 5–15)
BUN: 10 mg/dL (ref 8–23)
CO2: 26 mmol/L (ref 22–32)
Calcium: 8.6 mg/dL — ABNORMAL LOW (ref 8.9–10.3)
Chloride: 100 mmol/L (ref 98–111)
Creatinine, Ser: 1.05 mg/dL (ref 0.61–1.24)
GFR, Estimated: >60 mL/min (ref >60)
Glucose, Bld: 103 mg/dL — ABNORMAL HIGH (ref 70–99)
Potassium: 4.2 mmol/L (ref 3.5–5.1)
Sodium: 134 mmol/L — ABNORMAL LOW (ref 135–145)

## 2022-10-30 LAB — TROPONIN I (HIGH SENSITIVITY)
Troponin I (High Sensitivity): 9 ng/L (ref <18)
Troponin I (High Sensitivity): 9 ng/L (ref <18)

## 2022-10-30 MED ORDER — FUROSEMIDE 10 MG/ML IJ SOLN
80.0000 mg | Freq: Once | INTRAMUSCULAR | Status: AC
  Administered 2022-10-30: 80 mg via INTRAVENOUS
  Filled 2022-10-30: qty 8

## 2022-10-30 NOTE — ED Provider Notes (Signed)
[facility] Provider Note    Event Date/Time   First MD Initiated Contact with Patient 10/30/22 [phone]     (approximate)   History   Chief Complaint: Chest Pain   HPI  Jason Velazquez is a 89 y.o. male with a history of hypertension, GERD, CAD, stroke who is brought to the ED from [facility] due to chest pain on waking up this morning.  Patient reports that it felt sharp.  Lasted a few minutes and then went away.  It was nonradiating, no shortness of breath or diaphoresis.  Not exertional, not pleuritic.  No vomiting.  Denies any recent exertional symptoms.  Has been compliant with his medications, but feels like his furosemide does not produce substantial diuresis.  He has had some increased lower extremity swelling over the past few months.  Currently states that he feels fine, denies any pain or shortness of breath.     Physical Exam   Triage Vital Signs: ED Triage Vitals  Encounter Vitals Group     BP 10/30/22 0728 137/73     Systolic BP Percentile --      Diastolic BP Percentile --      Pulse Rate 10/30/22 0728 64     Resp 10/30/22 0728 18     Temp 10/30/22 0728 98.3 F (36.8 C)     Temp src --      SpO2 10/30/22 0728 96 %     Weight --      Height 10/30/22 0843 5\' 4"  (1.626 m)     Head Circumference --      Peak Flow --      Pain Score 10/30/22 0728 0     Pain Loc --      Pain Education --      Exclude from Growth Chart --     Most recent vital signs: Vitals:   10/30/22 0728  BP: 137/73  Pulse: 64  Resp: 18  Temp: 98.3 F (36.8 C)  SpO2: 96%    General: Awake, no distress.  CV:  Good peripheral perfusion.  Regular rate rhythm Resp:  Normal effort.  Clear to auscultation bilaterally Abd:  No distention.  Soft nontender Other:  1+ pitting edema bilateral lower extremities.  Symmetric calf circumference, no calf tenderness.   ED Results / Procedures / Treatments   Labs (all labs ordered are listed, but only abnormal results are  displayed) Labs Reviewed  BASIC METABOLIC PANEL - Abnormal; Notable for the following components:      Result Value   Sodium 134 (*)    Glucose, Bld 103 (*)    Calcium 8.6 (*)    All other components within normal limits  CBC - Abnormal; Notable for the following components:   RBC 4.11 (*)    Hemoglobin 11.2 (*)    HCT 35.2 (*)    RDW 15.7 (*)    All other components within normal limits  TROPONIN I (HIGH SENSITIVITY)  TROPONIN I (HIGH SENSITIVITY)     EKG Interpreted by me Normal sinus rhythm rate of 64.  Normal axis and intervals.  Poor R wave progression.  Normal ST segments and T waves.   RADIOLOGY Chest x-ray interpreted by me, appears unremarkable.  There is a hiatal hernia.  Radiology report reviewed   PROCEDURES:  Procedures   MEDICATIONS ORDERED IN ED: Medications  furosemide (LASIX) injection 80 mg (80 mg Intravenous Given 10/30/22 0957)     IMPRESSION / MDM / ASSESSMENT AND  PLAN / ED COURSE  I reviewed the triage vital signs and the nursing notes.  DDx: GERD, non-STEMI, pneumothorax, pleural effusion, pneumonia  Patient's presentation is most consistent with acute presentation with potential threat to life or bodily function.  Patient presents with a brief episode of atypical chest pain on waking up this morning.  With the timing, quality, and hiatal hernia found on chest x-ray, presentation is very suspicious for GERD.  Symptoms are now resolved.  EKG and first troponin are unremarkable.  Will trend troponin, if reassuring patient can be discharged home.  Will give additional Lasix IV in the ED for some enhance diuresis given his persistent lower extremity edema.  Doubt decompensated CHF, there is no pulmonary compromise.   ----------------------------------------- 10:30 AM on 10/30/2022 ----------------------------------------- Patient remains asymptomatic.  Serial troponin unchanged.  Stable for discharge.      FINAL CLINICAL IMPRESSION(S) / ED  DIAGNOSES   Final diagnoses:  Nonspecific chest pain     Rx / DC Orders   ED Discharge Orders     None        Note:  This document was prepared using Dragon voice recognition software and may include unintentional dictation errors.   Sharman Cheek, MD 10/30/22 1030

## 2022-10-30 NOTE — ED Notes (Signed)
[facility] called to give report, no answer, HIPAA compliant VM left

## 2022-10-30 NOTE — ED Triage Notes (Addendum)
First nurse note: Pt to ED via [facility] from [facility]. Facility called due to increased leg swelling. EMS reports this has been ongoing issue since July. Facility states he is also slow to get up which is not normal and has been complaining of ongoing left hip pain. Pt reports he woke up this morning with centralized CP but denies pain currently. Pt states he went to the beach last week and did a lot of walking. EMS reports VSS.

## 2022-10-30 NOTE — ED Notes (Signed)
Daughter at bedside. Daughter agrees to take pt back to assisted living facility at [facility]

## 2022-10-30 NOTE — Progress Notes (Signed)
Location:  Other [facility].  Nursing Home Room Number: [name]J Place of Service:  ALF [phone]) Provider:  [name], MD  Patient Care Team: [name], MD as PCP - General (Family Medicine) End, [name], MD as PCP - Cardiology (Cardiology) [name], MD as Referring Physician (Ophthalmology) [name], NP as Nurse Practitioner (Geriatric Medicine)  Extended Emergency Contact Information Primary Emergency Contact: [name] [place] Mobile Phone: [phone] Relation: Daughter  Code Status:  DNR Goals of care: Advanced Directive information    10/30/2022   12:48 PM  Advanced Directives  Does Patient Have a Medical Advance Directive? Yes  Type of Estate agent of [place];Out of facility DNR (pink MOST or yellow form)  Does patient want to make changes to medical advance directive? No - Patient declined  Copy of Healthcare Power of Attorney in Chart? Yes - validated most recent copy scanned in chart (See row information)     Chief Complaint  Patient presents with   Acute Visit    ER Follow up    HPI:  Pt is a 89 y.o. male seen today for an acute visit for ER Follow up  Right leg has been hurting since labor day. Didn't think there was a fracture because he has been walking fine. His leg continues to hurt. It's mainly the top of the leg.   His daughter Kriste Basque is present at this time.  May not have been able to move due to the weakness, but he is not having the same symptoms at this time.   He had chest pain from this morning. It was back pain as well this morning.   He didn't take his nitroglycerine.     Past Medical History:  Diagnosis Date   Allergic rhinitis    Benign prostatic hypertrophy    CAD (coronary artery disease)    a. 1998 CABG x 4(LIMA-LAD, VG-D1-D2, VG-RPL); b. 2000 & 2006 PCI of the LCX/OM2; c. 03/2018 Cath: LM 25d, LAD 100ost/m, 56m/d, D2 100, fills via collats  from D3, LCX 90ost/m ISR w/ evidence of stent fx, 37m, OM2 30 ISR, 80/80, RCA 70p, RPDA 60ost, 70, RPAV 100, VG->D1->D2 100p (culprit), LIMA->LAD nl, VG->RPL2 nl. EF 45%-->Med Rx; d. 03/2021 MV: ant/antsept/apical inf/inflat isch, EF 75%.   Duodenal diverticulum 02/11/2001   GERD (gastroesophageal reflux disease)    H/O hiatal hernia 02/11/2001   Heart murmur    a. 03/2018 Echo: EF 55-60%, DD, mild anterolateral HK.  Mild-mod TR. Mild Ca2+ of AoV and MV.   Hepatic lesion 02/11/2002   History of melanoma excision    FACE   Hypercholesteremia    Hypertension    Left carotid stenosis    > 50%  PER DUPLEX  03-28-2011   Mild mitral regurgitation    Perianal fistula    S/P CABG x 4    1988   S/P drug eluting coronary stent placement    POST CABG--  STENTING 2000  AND RESTENTING 2006 FOR IN-STENT STENOSIS   Stroke [facility])    Past Surgical History:  Procedure Laterality Date   CATARACT EXTRACTION W/ INTRAOCULAR LENS  IMPLANT, BILATERAL     CORONARY ANGIOPLASTY WITH STENT PLACEMENT  2000   PCI AND STENTING CIRCUMFLEX   CORONARY ANGIOPLASTY WITH STENT PLACEMENT  04-30-2004  DR Verdis Prime   RE-INSTENT STENOSIS/  DRUG-ELUTING STENT OF PROXIMAL AND MID CIRCUMFLEX/ PCI MID CIRCUMFLEX THROUGH STENT STRUT/  WIDELY PATENT SAPHENOUS VEIN GRAFT AND  LIMA  TO LAD GRAFT/ TOTAL OCCLUSION RIGHT CORONARY BEYOND THE POSTERIOR DESCENDING ARTERY BRANCH WITH 50% OSTIAL NARROWING/ TOTAL OCCLUSION OF LAD AT THE OSTIUM OF THE LEFT MAIN   CORONARY ARTERY BYPASS GRAFT  1988   X5   EVALUATION UNDER ANESTHESIA WITH ANAL FISTULECTOMY N/A 07/22/2012   Procedure: EXAM UNDER ANESTHESIA WITH ANAL fistulotomy;  Surgeon: Romie Levee, MD;  Location: [facility];  Service: General;  Laterality: N/A;   LEFT HEART CATH AND CORS/GRAFTS ANGIOGRAPHY N/A 03/31/2018   Procedure: LEFT HEART CATH AND CORS/GRAFTS ANGIOGRAPHY;  Surgeon: Yvonne Kendall, MD;  Location: [facility] INVASIVE CV LAB;  Service: Cardiovascular;   Laterality: N/A;   SHOULDER ARTHROSCOPY WITH OPEN ROTATOR CUFF REPAIR Right 07-10-1999   TONSILLECTOMY AND ADENOIDECTOMY  1944   TRANSTHORACIC ECHOCARDIOGRAM  03-01-2011  DR Verdis Prime   NORMAL LVF AND LV SIZE/ MILD LEFT ATRIAL ENLARGEMENT/ GRADE II DIASTOLIC DYSFUNCTION WITH ELEVATED LEFT ATRIAL PRESSURE/ MILD TO MODERATE MR/ MILD TR   TRANSURETHRAL RESECTION OF PROSTATE  04/09/2011   Procedure: TRANSURETHRAL RESECTION OF THE PROSTATE WITH GYRUS INSTRUMENTS;  Surgeon: Anner Crete, MD;  Location: [facility];  Service: Urology;  Laterality: N/A;    Allergies  Allergen Reactions   Tape Rash    ONLY USE PAPER TAPE ONLY USE PAPER TAPE   Iodine Rash    Rash when applied to skin   Niacin And Related Hives and Other (See Comments)    FLUSHING itching    Outpatient Encounter Medications as of 10/30/2022  Medication Sig   acetaminophen (TYLENOL) 325 MG tablet Take 650 mg by mouth every 4 (four) hours as needed.   alum & mag hydroxide-simeth (MAALOX/MYLANTA) 200-200-20 MG/5ML suspension Take by mouth every 4 (four) hours as needed for indigestion or heartburn.   amLODipine (NORVASC) 2.5 MG tablet Take 1 tablet (2.5 mg total) by mouth daily for 2 days.   atorvastatin (LIPITOR) 80 MG tablet Take one tablet nightly for high cholesterol   carbamide peroxide (DEBROX) 6.5 % OTIC solution Place 5 drops into both ears as needed.   cetirizine (ZYRTEC) 10 MG chewable tablet Chew 10 mg by mouth as needed for allergies.   clobetasol cream (TEMOVATE) 0.05 % Apply topically 2 (two) times daily.   clopidogrel (PLAVIX) 75 MG tablet Take 75 mg by mouth daily.   cyanocobalamin (,VITAMIN B-12,) 1000 MCG/ML injection Inject 1,000 mcg into the muscle every 30 (thirty) days.   dextromethorphan-guaiFENesin (MUCINEX DM) 30-600 MG 12hr tablet Take 1 tablet by mouth 2 (two) times daily as needed for cough.   dextromethorphan-guaiFENesin (ROBITUSSIN-DM) 10-100 MG/5ML liquid Take 10 mLs by mouth every 4 (four) hours as  needed for cough.   Emollient (EUCERIN EX) Apply to BLE and trunk topically two times daily   furosemide (LASIX) 20 MG tablet Take 20 mg by mouth as needed. For the overnight weight gain of 3 lbs or for 5lb gain in one week   furosemide (LASIX) 80 MG tablet Take 80 mg by mouth daily.   Glucose 15 GM/32ML GEL Take 1 packet by mouth as needed.   hydrocortisone 2.5 % cream Apply 1 Application topically 2 (two) times daily as needed.   isosorbide mononitrate (IMDUR) 30 MG 24 hr tablet Take 30 mg by mouth daily.   isosorbide mononitrate (IMDUR) 60 MG 24 hr tablet Take 60 mg by mouth daily.   ketoconazole (NIZORAL) 2 % cream Apply 1 application topically 2 (two) times daily.   Loteprednol Etabonate 0.2 % EMUL Apply 1 drop to  eye as needed.   magnesium hydroxide (MILK OF MAGNESIA) 400 MG/5ML suspension Take 30 mLs by mouth daily as needed for mild constipation.   metoprolol succinate (TOPROL-XL) 25 MG 24 hr tablet Take 1 tablet (25 mg total) by mouth daily for 2 days. Take with or immediately following a meal.   nitroGLYCERIN (NITROSTAT) 0.4 MG SL tablet PLACE 1 TABLET (0.4 MG TOTAL) UNDER THE TONGUE EVERY 5 (FIVE) MINUTES AS NEEDED FOR CHEST PAIN.   nystatin (MYCOSTATIN/NYSTOP) powder Apply 1 Application topically 2 (two) times daily.   ondansetron (ZOFRAN) 4 MG tablet Take 4 mg by mouth as needed for nausea or vomiting. Up to 3 times a day   OXYGEN 2lpm for dyspnea or SOB   pantoprazole (PROTONIX) 40 MG tablet Take 1 tablet (40 mg total) by mouth 2 (two) times daily for 2 days.   polyethylene glycol (MIRALAX / GLYCOLAX) packet Take 17 g by mouth daily.   potassium chloride SA (KLOR-CON M) 20 MEQ tablet TAKE 1 TABLET BY MOUTH ONCE DAILY  *TAKE WITH FOOD* *DO NOT CRUSH OR CHEW* *MAY DISSOLVE*   tamsulosin (FLOMAX) 0.4 MG CAPS capsule Take 0.4 mg by mouth daily.   No facility-administered encounter medications on file as of 10/30/2022.    Review of Systems  Immunization History  Administered  Date(s) Administered   Covid-19, Mrna,Vaccine(Spikevax)36yrs and older 05/21/2022   Influenza, High Dose Seasonal PF 12/13/2013, 12/27/2014, 11/02/2015, 11/11/2017   Influenza-Unspecified 11/11/2016, 11/28/2020, 11/28/2021   Moderna Covid-19 Vaccine Bivalent Booster 30yrs & up 12/21/2021   Moderna Sars-Covid-2 Vaccination 02/22/2019, 03/22/2019, 12/24/2019   Pneumococcal Conjugate-13 03/14/2014   Pneumococcal Polysaccharide-23 02/11/2013   Tdap 09/19/2015   Unspecified SARS-COV-2 Vaccination 12/24/2019, 06/30/2020, 11/03/2020, 07/10/2021   Zoster Recombinant(Shingrix) 06/12/2022, 10/11/2022   Zoster, Live 02/11/2013   Pertinent  Health Maintenance Due  Topic Date Due   INFLUENZA VACCINE  09/12/2022      04/01/2018    9:40 AM 04/01/2018    8:00 PM 04/13/2018   12:58 PM 03/10/2019    7:17 PM 12/18/2020    2:40 AM  Fall Risk  Falls in the past year?   0 0   (RETIRED) Patient Fall Risk Level High fall risk Moderate fall risk   Low fall risk   Functional Status Survey:    Vitals:   10/30/22 1237 10/30/22 1250  BP: (!) 150/84 (!) 150/84  Pulse: 88   Resp: 17   Temp: 98.4 F (36.9 C)   SpO2: 96%   Weight: 196 lb (88.9 kg)   Height: 5\' 4"  (1.626 m)    Body mass index is 33.64 kg/m. Physical Exam Constitutional:      Appearance: Normal appearance.  Cardiovascular:     Rate and Rhythm: Normal rate and regular rhythm.     Pulses: Normal pulses.     Heart sounds: Normal heart sounds.  Pulmonary:     Effort: Pulmonary effort is normal.     Breath sounds: Normal breath sounds.  Abdominal:     Comments: Umbilical hernia palpable and reducible.   Neurological:     Mental Status: He is alert. Mental status is at baseline.     Cranial Nerves: No cranial nerve deficit.     Motor: No weakness.  Psychiatric:        Mood and Affect: Mood normal.     Labs reviewed: Recent Labs    02/27/22 1120 04/15/22 0000 08/03/22 1551 08/12/22 0000 10/30/22 0729  NA 135   < > 128* 134*  134*  K 4.0   < > 3.6 4.0 4.2  CL 99   < > 97* 98* 100  CO2 27   < > 22 24* 26  GLUCOSE 128*  --  135*  --  103*  BUN 11   < > 16 10 10   CREATININE 1.21   < > 1.31* 1.2 1.05  CALCIUM 8.1*   < > 7.9* 8.7 8.6*   < > = values in this interval not displayed.   Recent Labs    04/15/22 0000 08/03/22 1551  AST 15 21  ALT 11 16  ALKPHOS 110 114  BILITOT  --  0.8  PROT  --  6.3*  ALBUMIN 3.8 3.6   Recent Labs    02/27/22 1120 04/15/22 0000 08/03/22 1551 10/30/22 0729  WBC 7.8 6.1 5.8 5.9  NEUTROABS  --  3,709.00  --   --   HGB 11.8* 11.2* 10.7* 11.2*  HCT 36.4* 34* 33.1* 35.2*  MCV 85.8  --  84.9 85.6  PLT 267 244 228 261   Lab Results  Component Value Date   TSH 4.94 (H) 08/25/2017   Lab Results  Component Value Date   HGBA1C 5.8 (H) 05/27/2013   Lab Results  Component Value Date   CHOL 109 10/11/2021   HDL 34 (L) 10/11/2021   LDLCALC 51 10/11/2021   TRIG 118 10/11/2021   CHOLHDL 3.2 10/11/2021    Significant Diagnostic Results in last 30 days:  DG Chest 2 View  Result Date: 10/30/2022 CLINICAL DATA:  89 year old male with chest pain, lower extremity swelling. EXAM: CHEST - 2 VIEW COMPARISON:  CTA chest 12/18/2020 and earlier. FINDINGS: Seated AP and lateral views at 0741 hours. Chronic CABG, sternotomy. Similar somewhat low lung volumes to 2022. Calcified aortic atherosclerosis. Stable mediastinal contours. Moderate sized chronic gastric hiatal hernia. Small chronic right Bochdalek's hernia also. Visualized tracheal air column is within normal limits. No pneumothorax or pulmonary edema. No pleural effusion or consolidation. Ventilation appears stable from the 2022 exam. Negative visible bowel gas. No acute osseous abnormality identified. IMPRESSION: 1. No acute cardiopulmonary abnormality. 2. Chronic CABG, moderate sized gastric hiatal hernia, Aortic Atherosclerosis (ICD10-I70.0). Electronically Signed   By: Odessa Fleming M.D.   On: 10/30/2022 08:01     Assessment/Plan Right leg weakness - Plan: Ambulatory referral to Physical Therapy  Hospital discharge follow-up  Nonspecific chest pain Patient evaluated today for chest pain resolved without nitroglycerine. Complete evaluation for chest pain with labs negative for ACS, pneumonia, etc. S/p lasix in the ED. BP elevated wi/o typical home BP meds. Encourage use of abating medications. Discussed reasons for proximal weakness such as PMR which typically has inflammatory symptoms. Given chronicity of symptoms, will start physical therapy. Could consider a neurology referral for EMG, however, will discuss further at f/u.   Family/ staff Communication: becky, nursing  Labs/tests ordered:  ESR, CRP, CK.

## 2022-10-30 NOTE — ED Notes (Signed)
[facility] called, no answer

## 2022-10-31 DIAGNOSIS — L570 Actinic keratosis: Secondary | ICD-10-CM | POA: Diagnosis not present

## 2022-10-31 DIAGNOSIS — L821 Other seborrheic keratosis: Secondary | ICD-10-CM | POA: Diagnosis not present

## 2022-10-31 DIAGNOSIS — L814 Other melanin hyperpigmentation: Secondary | ICD-10-CM | POA: Diagnosis not present

## 2022-10-31 DIAGNOSIS — M791 Myalgia, unspecified site: Secondary | ICD-10-CM | POA: Diagnosis not present

## 2022-11-01 ENCOUNTER — Encounter: Payer: Self-pay | Admitting: Student

## 2022-11-05 DIAGNOSIS — M79651 Pain in right thigh: Secondary | ICD-10-CM | POA: Diagnosis not present

## 2022-11-05 DIAGNOSIS — M6281 Muscle weakness (generalized): Secondary | ICD-10-CM | POA: Diagnosis not present

## 2022-11-07 DIAGNOSIS — M6281 Muscle weakness (generalized): Secondary | ICD-10-CM | POA: Diagnosis not present

## 2022-11-07 DIAGNOSIS — M79651 Pain in right thigh: Secondary | ICD-10-CM | POA: Diagnosis not present

## 2022-11-12 DIAGNOSIS — M79651 Pain in right thigh: Secondary | ICD-10-CM | POA: Diagnosis not present

## 2022-11-12 DIAGNOSIS — M6281 Muscle weakness (generalized): Secondary | ICD-10-CM | POA: Diagnosis not present

## 2022-11-14 DIAGNOSIS — M79651 Pain in right thigh: Secondary | ICD-10-CM | POA: Diagnosis not present

## 2022-11-14 DIAGNOSIS — M6281 Muscle weakness (generalized): Secondary | ICD-10-CM | POA: Diagnosis not present

## 2022-11-21 DIAGNOSIS — M79651 Pain in right thigh: Secondary | ICD-10-CM | POA: Diagnosis not present

## 2022-11-21 DIAGNOSIS — M6281 Muscle weakness (generalized): Secondary | ICD-10-CM | POA: Diagnosis not present

## 2022-11-26 DIAGNOSIS — M79651 Pain in right thigh: Secondary | ICD-10-CM | POA: Diagnosis not present

## 2022-11-26 DIAGNOSIS — M6281 Muscle weakness (generalized): Secondary | ICD-10-CM | POA: Diagnosis not present

## 2022-11-27 ENCOUNTER — Other Ambulatory Visit: Payer: Self-pay | Admitting: Internal Medicine

## 2022-11-27 DIAGNOSIS — Z760 Encounter for issue of repeat prescription: Secondary | ICD-10-CM

## 2022-11-27 NOTE — Telephone Encounter (Signed)
Please review refill request.  Last filled by Dr. Sydnee Cabal for 2 days.  Should this be long term rx?  Thanks!  Last cardio visit on 02/27/22 with plan to f/u in 6 months, not scheduled.  ED visit on 10/30/22.

## 2022-11-28 DIAGNOSIS — M79651 Pain in right thigh: Secondary | ICD-10-CM | POA: Diagnosis not present

## 2022-11-28 DIAGNOSIS — M6281 Muscle weakness (generalized): Secondary | ICD-10-CM | POA: Diagnosis not present

## 2022-12-03 DIAGNOSIS — M79651 Pain in right thigh: Secondary | ICD-10-CM | POA: Diagnosis not present

## 2022-12-03 DIAGNOSIS — M6281 Muscle weakness (generalized): Secondary | ICD-10-CM | POA: Diagnosis not present

## 2022-12-05 DIAGNOSIS — M6281 Muscle weakness (generalized): Secondary | ICD-10-CM | POA: Diagnosis not present

## 2022-12-05 DIAGNOSIS — M79651 Pain in right thigh: Secondary | ICD-10-CM | POA: Diagnosis not present

## 2022-12-25 DIAGNOSIS — Z23 Encounter for immunization: Secondary | ICD-10-CM | POA: Diagnosis not present

## 2023-01-14 ENCOUNTER — Non-Acute Institutional Stay: Payer: Self-pay | Admitting: Nurse Practitioner

## 2023-01-14 ENCOUNTER — Other Ambulatory Visit: Payer: Self-pay | Admitting: Nurse Practitioner

## 2023-01-14 ENCOUNTER — Encounter: Payer: Self-pay | Admitting: Nurse Practitioner

## 2023-01-14 DIAGNOSIS — I25118 Atherosclerotic heart disease of native coronary artery with other forms of angina pectoris: Secondary | ICD-10-CM

## 2023-01-14 DIAGNOSIS — I5032 Chronic diastolic (congestive) heart failure: Secondary | ICD-10-CM

## 2023-01-14 DIAGNOSIS — I255 Ischemic cardiomyopathy: Secondary | ICD-10-CM | POA: Diagnosis not present

## 2023-01-14 DIAGNOSIS — R351 Nocturia: Secondary | ICD-10-CM

## 2023-01-14 DIAGNOSIS — E785 Hyperlipidemia, unspecified: Secondary | ICD-10-CM | POA: Diagnosis not present

## 2023-01-14 DIAGNOSIS — I10 Essential (primary) hypertension: Secondary | ICD-10-CM

## 2023-01-14 DIAGNOSIS — D649 Anemia, unspecified: Secondary | ICD-10-CM | POA: Diagnosis not present

## 2023-01-14 DIAGNOSIS — N401 Enlarged prostate with lower urinary tract symptoms: Secondary | ICD-10-CM | POA: Diagnosis not present

## 2023-01-14 DIAGNOSIS — N1831 Chronic kidney disease, stage 3a: Secondary | ICD-10-CM | POA: Diagnosis not present

## 2023-01-14 DIAGNOSIS — E871 Hypo-osmolality and hyponatremia: Secondary | ICD-10-CM | POA: Diagnosis not present

## 2023-01-14 DIAGNOSIS — B372 Candidiasis of skin and nail: Secondary | ICD-10-CM

## 2023-01-14 MED ORDER — NYSTATIN-TRIAMCINOLONE 100000-0.1 UNIT/GM-% EX OINT
1.0000 | TOPICAL_OINTMENT | Freq: Two times a day (BID) | CUTANEOUS | 0 refills | Status: DC
Start: 2023-01-14 — End: 2023-04-13

## 2023-01-14 NOTE — Progress Notes (Signed)
Location:  Other [facility]) Nursing Home Room Number: 48 S Place of Service:  [phone])  [name], MD  Patient Care Team: [name], MD as PCP - General (Family Medicine) End, [name], MD as PCP - Cardiology (Cardiology) [name], MD as Referring Physician (Ophthalmology) [name], NP as Nurse Practitioner (Geriatric Medicine)  Extended Emergency Contact Information Primary Emergency Contact: [name] [place] of [place] Mobile Phone: [phone] Relation: Daughter  Goals of care: Advanced Directive information    01/14/2023    2:28 PM  Advanced Directives  Does Patient Have a Medical Advance Directive? Yes  Type of Estate agent of [place];Out of facility DNR (pink MOST or yellow form)  Does patient want to make changes to medical advance directive? No - Patient declined  Copy of Healthcare Power of Attorney in Chart? Yes - validated most recent copy scanned in chart (See row information)     Chief Complaint  Patient presents with   Medical Management of Chronic Issues    Routine visit. Discuss need for flu vaccine.     HPI:  Pt is a 89 y.o. male seen today for medical management of chronic disease.  Pt reports he is doing well. No pains.  No shortness of breath, chest pains, or LE edema.  Occasional constipation maintained on miralax Gets up one time to urinate at night- no changes in frequency or flow.  Eats well. Sleeps good at night  No anxiety or depression.   Reports rash under breast   Past Medical History:  Diagnosis Date   Allergic rhinitis    Benign prostatic hypertrophy    CAD (coronary artery disease)    a. 1998 CABG x 4(LIMA-LAD, VG-D1-D2, VG-RPL); b. 2000 & 2006 PCI of the LCX/OM2; c. 03/2018 Cath: LM 25d, LAD 100ost/m, 106m/d, D2 100, fills via collats from D3, LCX 90ost/m ISR w/ evidence of stent fx, 43m, OM2 30 ISR, 80/80, RCA 70p, RPDA 60ost, 70, RPAV 100, VG->D1->D2  100p (culprit), LIMA->LAD nl, VG->RPL2 nl. EF 45%-->Med Rx; d. 03/2021 MV: ant/antsept/apical inf/inflat isch, EF 75%.   Duodenal diverticulum 02/11/2001   GERD (gastroesophageal reflux disease)    H/O hiatal hernia 02/11/2001   Heart murmur    a. 03/2018 Echo: EF 55-60%, DD, mild anterolateral HK.  Mild-mod TR. Mild Ca2+ of AoV and MV.   Hepatic lesion 02/11/2002   History of melanoma excision    FACE   Hypercholesteremia    Hypertension    Left carotid stenosis    > 50%  PER DUPLEX  03-28-2011   Mild mitral regurgitation    Perianal fistula    S/P CABG x 4    1988   S/P drug eluting coronary stent placement    POST CABG--  STENTING 2000  AND RESTENTING 2006 FOR IN-STENT STENOSIS   Stroke [facility])    Past Surgical History:  Procedure Laterality Date   CATARACT EXTRACTION W/ INTRAOCULAR LENS  IMPLANT, BILATERAL     CORONARY ANGIOPLASTY WITH STENT PLACEMENT  2000   PCI AND STENTING CIRCUMFLEX   CORONARY ANGIOPLASTY WITH STENT PLACEMENT  04-30-2004  DR Verdis Prime   RE-INSTENT STENOSIS/  DRUG-ELUTING STENT OF PROXIMAL AND MID CIRCUMFLEX/ PCI MID CIRCUMFLEX THROUGH STENT STRUT/  WIDELY PATENT SAPHENOUS VEIN GRAFT AND  LIMA TO LAD GRAFT/ TOTAL OCCLUSION RIGHT CORONARY BEYOND THE POSTERIOR DESCENDING ARTERY BRANCH WITH 50% OSTIAL NARROWING/ TOTAL OCCLUSION OF LAD AT THE OSTIUM OF THE LEFT MAIN   CORONARY ARTERY BYPASS GRAFT  1988   X5   EVALUATION UNDER ANESTHESIA WITH ANAL FISTULECTOMY N/A 07/22/2012   Procedure: EXAM UNDER ANESTHESIA WITH ANAL fistulotomy;  Surgeon: Romie Levee, MD;  Location: [facility];  Service: General;  Laterality: N/A;   LEFT HEART CATH AND CORS/GRAFTS ANGIOGRAPHY N/A 03/31/2018   Procedure: LEFT HEART CATH AND CORS/GRAFTS ANGIOGRAPHY;  Surgeon: Yvonne Kendall, MD;  Location: [facility] INVASIVE CV LAB;  Service: Cardiovascular;  Laterality: N/A;   SHOULDER ARTHROSCOPY WITH OPEN ROTATOR CUFF REPAIR Right 07-10-1999   TONSILLECTOMY AND ADENOIDECTOMY  1944    TRANSTHORACIC ECHOCARDIOGRAM  03-01-2011  DR Verdis Prime   NORMAL LVF AND LV SIZE/ MILD LEFT ATRIAL ENLARGEMENT/ GRADE II DIASTOLIC DYSFUNCTION WITH ELEVATED LEFT ATRIAL PRESSURE/ MILD TO MODERATE MR/ MILD TR   TRANSURETHRAL RESECTION OF PROSTATE  04/09/2011   Procedure: TRANSURETHRAL RESECTION OF THE PROSTATE WITH GYRUS INSTRUMENTS;  Surgeon: Anner Crete, MD;  Location: [facility];  Service: Urology;  Laterality: N/A;    Allergies  Allergen Reactions   Tape Rash    ONLY USE PAPER TAPE ONLY USE PAPER TAPE   Iodine Rash    Rash when applied to skin   Niacin And Related Hives and Other (See Comments)    FLUSHING itching    Outpatient Encounter Medications as of 01/14/2023  Medication Sig   acetaminophen (TYLENOL) 325 MG tablet Take 650 mg by mouth every 4 (four) hours as needed.   alum & mag hydroxide-simeth (MAALOX/MYLANTA) 200-200-20 MG/5ML suspension Take 30 mLs by mouth every 4 (four) hours as needed for indigestion or heartburn.   amLODipine (NORVASC) 2.5 MG tablet TAKE 1 TABLET BY MOUTH ONCE DAILY   atorvastatin (LIPITOR) 80 MG tablet Take one tablet nightly for high cholesterol   carbamide peroxide (DEBROX) 6.5 % OTIC solution Place 5 drops into both ears as needed.   cetirizine (ZYRTEC) 10 MG chewable tablet Chew 10 mg by mouth as needed for allergies.   clobetasol cream (TEMOVATE) 0.05 % Apply topically 2 (two) times daily.   clopidogrel (PLAVIX) 75 MG tablet Take 75 mg by mouth daily.   cyanocobalamin (,VITAMIN B-12,) 1000 MCG/ML injection Inject 1,000 mcg into the muscle every 30 (thirty) days.   dextromethorphan-guaiFENesin (MUCINEX DM) 30-600 MG 12hr tablet Take 1 tablet by mouth 2 (two) times daily as needed for cough.   dextromethorphan-guaiFENesin (ROBITUSSIN-DM) 10-100 MG/5ML liquid Take 10 mLs by mouth every 4 (four) hours as needed for cough.   Emollient (EUCERIN EX) Apply to BLE and trunk topically two times daily   furosemide (LASIX) 20 MG tablet Take 20 mg by mouth  as needed. For the overnight weight gain of 3 lbs or for 5lb gain in one week   furosemide (LASIX) 80 MG tablet Take 80 mg by mouth daily.   Glucose 15 GM/32ML GEL Take 1 packet by mouth as needed.   hydrocortisone 2.5 % cream Apply 1 Application topically 2 (two) times daily as needed.   isosorbide mononitrate (IMDUR) 60 MG 24 hr tablet Take 60 mg by mouth daily.   ketoconazole (NIZORAL) 2 % cream Apply 1 application topically 2 (two) times daily.   Loteprednol Etabonate 0.2 % EMUL Apply 1 drop to eye as needed.   magnesium hydroxide (MILK OF MAGNESIA) 400 MG/5ML suspension Take 30 mLs by mouth daily as needed for mild constipation.   metoprolol succinate (TOPROL-XL) 25 MG 24 hr tablet Take 25 mg by mouth every morning.   nitroGLYCERIN (NITROSTAT) 0.4 MG SL tablet PLACE 1 TABLET (0.4 MG  TOTAL) UNDER THE TONGUE EVERY 5 (FIVE) MINUTES AS NEEDED FOR CHEST PAIN.   nystatin (MYCOSTATIN/NYSTOP) powder Apply 1 Application topically 2 (two) times daily.   ondansetron (ZOFRAN) 4 MG tablet Take 4 mg by mouth as needed for nausea or vomiting. Up to 3 times a day   OXYGEN 2lpm for dyspnea or SOB   pantoprazole (PROTONIX) 40 MG tablet Take 1 tablet (40 mg total) by mouth 2 (two) times daily for 2 days.   polyethylene glycol (MIRALAX / GLYCOLAX) packet Take 17 g by mouth daily.   potassium chloride SA (KLOR-CON M) 20 MEQ tablet TAKE 1 TABLET BY MOUTH ONCE DAILY  *TAKE WITH FOOD* *DO NOT CRUSH OR CHEW* *MAY DISSOLVE*   tamsulosin (FLOMAX) 0.4 MG CAPS capsule Take 0.4 mg by mouth daily.   isosorbide mononitrate (IMDUR) 30 MG 24 hr tablet Take 30 mg by mouth daily.   metoprolol succinate (TOPROL-XL) 25 MG 24 hr tablet Take 1 tablet (25 mg total) by mouth daily for 2 days. Take with or immediately following a meal. (Patient not taking: Reported on 01/14/2023)   No facility-administered encounter medications on file as of 01/14/2023.    Review of Systems  Constitutional:  Negative for chills and fever.  HENT:   Negative for tinnitus.   Respiratory:  Negative for cough and shortness of breath.   Cardiovascular:  Negative for chest pain, palpitations and leg swelling.  Gastrointestinal:  Negative for abdominal pain, constipation and diarrhea.  Genitourinary:  Negative for dysuria, frequency and urgency.  Musculoskeletal:  Negative for back pain and myalgias.  Skin:  Positive for rash.  Neurological:  Negative for dizziness and headaches.     Immunization History  Administered Date(s) Administered   Influenza, High Dose Seasonal PF 12/13/2013, 12/27/2014, 11/02/2015, 11/11/2017   Influenza-Unspecified 11/11/2016, 11/28/2020, 11/28/2021   Moderna Covid-19 Fall Seasonal Vaccine 69yrs & older 05/21/2022, 12/26/2022   Moderna Covid-19 Vaccine Bivalent Booster 93yrs & up 12/21/2021   Moderna Sars-Covid-2 Vaccination 02/22/2019, 03/22/2019, 12/24/2019   Pneumococcal Conjugate-13 03/14/2014   Pneumococcal Polysaccharide-23 02/11/2013   Tdap 09/19/2015   Unspecified SARS-COV-2 Vaccination 12/24/2019, 06/30/2020, 11/03/2020, 07/10/2021   Zoster Recombinant(Shingrix) 06/12/2022, 10/11/2022   Zoster, Live 02/11/2013   Pertinent  Health Maintenance Due  Topic Date Due   INFLUENZA VACCINE  09/12/2022      04/01/2018    9:40 AM 04/01/2018    8:00 PM 04/13/2018   12:58 PM 03/10/2019    7:17 PM 12/18/2020    2:40 AM  Fall Risk  Falls in the past year?   0 0   (RETIRED) Patient Fall Risk Level High fall risk Moderate fall risk   Low fall risk   Functional Status Survey:    Vitals:   01/14/23 1426  BP: 123/73  Pulse: 78  Weight: 197 lb (89.4 kg)  Height: 5\' 4"  (1.626 m)   Body mass index is 33.81 kg/m. Physical Exam Constitutional:      General: He is not in acute distress.    Appearance: He is well-developed. He is not diaphoretic.  HENT:     Head: Normocephalic and atraumatic.     Right Ear: External ear normal.     Left Ear: External ear normal.     Mouth/Throat:     Pharynx: No  oropharyngeal exudate.  Eyes:     Conjunctiva/sclera: Conjunctivae normal.     Pupils: Pupils are equal, round, and reactive to light.  Cardiovascular:     Rate and Rhythm: Normal rate and regular  rhythm.     Heart sounds: Normal heart sounds.  Pulmonary:     Effort: Pulmonary effort is normal.     Breath sounds: Normal breath sounds.  Abdominal:     General: Bowel sounds are normal.     Palpations: Abdomen is soft.  Musculoskeletal:        General: No tenderness.     Cervical back: Normal range of motion and neck supple.     Right lower leg: No edema.     Left lower leg: No edema.  Skin:    General: Skin is warm and dry.     Findings: Rash present.  Neurological:     Mental Status: He is alert and oriented to person, place, and time.     Labs reviewed: Recent Labs    02/27/22 1120 04/15/22 0000 08/03/22 1551 08/12/22 0000 10/30/22 0729  NA 135   < > 128* 134* 134*  K 4.0   < > 3.6 4.0 4.2  CL 99   < > 97* 98* 100  CO2 27   < > 22 24* 26  GLUCOSE 128*  --  135*  --  103*  BUN 11   < > 16 10 10   CREATININE 1.21   < > 1.31* 1.2 1.05  CALCIUM 8.1*   < > 7.9* 8.7 8.6*   < > = values in this interval not displayed.   Recent Labs    04/15/22 0000 08/03/22 1551  AST 15 21  ALT 11 16  ALKPHOS 110 114  BILITOT  --  0.8  PROT  --  6.3*  ALBUMIN 3.8 3.6   Recent Labs    02/27/22 1120 04/15/22 0000 08/03/22 1551 10/30/22 0729  WBC 7.8 6.1 5.8 5.9  NEUTROABS  --  3,709.00  --   --   HGB 11.8* 11.2* 10.7* 11.2*  HCT 36.4* 34* 33.1* 35.2*  MCV 85.8  --  84.9 85.6  PLT 267 244 228 261   Lab Results  Component Value Date   TSH 4.94 (H) 08/25/2017   Lab Results  Component Value Date   HGBA1C 5.8 (H) 05/27/2013   Lab Results  Component Value Date   CHOL 109 10/11/2021   HDL 34 (L) 10/11/2021   LDLCALC 51 10/11/2021   TRIG 118 10/11/2021   CHOLHDL 3.2 10/11/2021    Significant Diagnostic Results in last 30 days:  No results  found.  Assessment/Plan 1. Essential hypertension -Blood pressure well controlled, goal bp <140/90 Continue current medications and dietary modifications follow metabolic panel   2. Coronary artery disease of native artery of native heart with stable angina pectoris (HCC) -stable without current chest pains, continues on plavix, imdur, metoprolol  3. Stage 3a chronic kidney disease (HCC) -Chronic and stable Encourage proper hydration Follow metabolic panel Avoid nephrotoxic meds (NSAIDS)  4. Chronic heart failure with preserved ejection fraction (HFpEF) (HCC) -stable, euvolemic at this time, continue on current regimen  5. Benign prostatic hyperplasia with nocturia Controlled on flomax  6. Hyponatremia Will follow up bmp  7. Anemia, unspecified type Follow up lipids  8. Ischemic cardiomyopathy Stable, without chest pains, continues to follow up with cardiology  9. Hyperlipidemia LDL goal <70 -update lipids. Continues on statin  10. Yeast dermatitis Noted to chest Start nystatin ointment BID until resolves.     Janene Harvey. [facility]ag[facility] [phone]

## 2023-01-16 DIAGNOSIS — R946 Abnormal results of thyroid function studies: Secondary | ICD-10-CM | POA: Diagnosis not present

## 2023-01-16 DIAGNOSIS — I10 Essential (primary) hypertension: Secondary | ICD-10-CM | POA: Diagnosis not present

## 2023-01-16 DIAGNOSIS — E785 Hyperlipidemia, unspecified: Secondary | ICD-10-CM | POA: Diagnosis not present

## 2023-01-16 LAB — BASIC METABOLIC PANEL
BUN: 10 (ref 4–21)
CO2: 26 — AB (ref 13–22)
Chloride: 98 — AB (ref 99–108)
Creatinine: 1 (ref 0.6–1.3)
Glucose: 90
Potassium: 4.1 meq/L (ref 3.5–5.1)
Sodium: 134 — AB (ref 137–147)

## 2023-01-16 LAB — COMPREHENSIVE METABOLIC PANEL
Albumin: 3.9 (ref 3.5–5.0)
Calcium: 8.4 — AB (ref 8.7–10.7)
Globulin: 2.3
eGFR: 71

## 2023-01-16 LAB — HEPATIC FUNCTION PANEL
ALT: 12 U/L (ref 10–40)
AST: 16 (ref 14–40)
Bilirubin, Total: 0.7

## 2023-01-16 LAB — LIPID PANEL
Cholesterol: 106 (ref 0–200)
HDL: 39 (ref 35–70)
LDL Cholesterol: 50
Triglycerides: 85 (ref 40–160)

## 2023-01-16 LAB — CBC AND DIFFERENTIAL
HCT: 35 — AB (ref 41–53)
Hemoglobin: 11 — AB (ref 13.5–17.5)
Neutrophils Absolute: 4420
Platelets: 274 10*3/uL (ref 150–400)
WBC: 6.5

## 2023-01-16 LAB — CBC: RBC: 4.13 (ref 3.87–5.11)

## 2023-01-16 LAB — TSH: TSH: 5.46 (ref 0.41–5.90)

## 2023-01-30 DIAGNOSIS — L570 Actinic keratosis: Secondary | ICD-10-CM | POA: Diagnosis not present

## 2023-03-19 ENCOUNTER — Encounter: Payer: Self-pay | Admitting: Student

## 2023-03-19 ENCOUNTER — Non-Acute Institutional Stay: Payer: Self-pay | Admitting: Student

## 2023-03-19 DIAGNOSIS — N1831 Chronic kidney disease, stage 3a: Secondary | ICD-10-CM

## 2023-03-19 DIAGNOSIS — F03A18 Unspecified dementia, mild, with other behavioral disturbance: Secondary | ICD-10-CM

## 2023-03-19 DIAGNOSIS — I25118 Atherosclerotic heart disease of native coronary artery with other forms of angina pectoris: Secondary | ICD-10-CM

## 2023-03-19 DIAGNOSIS — I5032 Chronic diastolic (congestive) heart failure: Secondary | ICD-10-CM

## 2023-03-19 NOTE — Progress Notes (Signed)
 Location:  Other Community [facility] Room Number: [place] 793D Place of Service:  ALF [phone]) Provider:  Abdul Fine, MD  Patient Care Team: Abdul Fine, MD as PCP - General (Family Medicine) End, Lonni, MD as PCP - Cardiology (Cardiology) Mittie Gaskin, MD as Referring Physician (Ophthalmology) Caro Harlene POUR, NP as Nurse Practitioner (Geriatric Medicine)  Extended Emergency Contact Information Primary Emergency Contact: Hebner,Rebecca  [place] Mobile Phone: [phone] Relation: Daughter  Code Status:  DNR Goals of care: Advanced Directive information    03/19/2023    9:40 AM  Advanced Directives  Does Patient Have a Medical Advance Directive? Yes  Type of Estate Agent of [place];Out of facility DNR (pink MOST or yellow form)  Does patient want to make changes to medical advance directive? No - Patient declined  Copy of Healthcare Power of Attorney in Chart? Yes - validated most recent copy scanned in chart (See row information)     Chief Complaint  Patient presents with   Acute Visit    Behaviors    HPI:  Pt is a 89 y.o. male seen today for an acute visit for Behaviors Patient was inappropriate with a staff member with words and behavior. He states he had a lapse in judgement and profusely apologizes. States he didn't confuse her for someone else. States this is not a typical problem or concern and does not think he needs any medication to help with this issue.   Spoke with nursing who state he has had progression of memory changes, but this is the first time there has been an issue with a staff member. They will continue monitoring for concerning episodes.    Past Medical History:  Diagnosis Date   Allergic rhinitis    Benign prostatic hypertrophy    CAD (coronary artery disease)    a. 1998 CABG x 4(LIMA-LAD, VG-D1-D2, VG-RPL); b. 2000 & 2006 PCI of the LCX/OM2; c. 03/2018 Cath: LM 25d, LAD  100ost/m, 3m/d, D2 100, fills via collats from D3, LCX 90ost/m ISR w/ evidence of stent fx, 46m, OM2 30 ISR, 80/80, RCA 70p, RPDA 60ost, 70, RPAV 100, VG->D1->D2 100p (culprit), LIMA->LAD nl, VG->RPL2 nl. EF 45%-->Med Rx; d. 03/2021 MV: ant/antsept/apical inf/inflat isch, EF 75%.   Duodenal diverticulum 02/11/2001   GERD (gastroesophageal reflux disease)    H/O hiatal hernia 02/11/2001   Heart murmur    a. 03/2018 Echo: EF 55-60%, DD, mild anterolateral HK.  Mild-mod TR. Mild Ca2+ of AoV and MV.   Hepatic lesion 02/11/2002   History of melanoma excision    FACE   Hypercholesteremia    Hypertension    Left carotid stenosis    > 50%  PER DUPLEX  03-28-2011   Mild mitral regurgitation    Perianal fistula    S/P CABG x 4    1988   S/P drug eluting coronary stent placement    POST CABG--  STENTING 2000  AND RESTENTING 2006 FOR IN-STENT STENOSIS   Stroke [facility])    Past Surgical History:  Procedure Laterality Date   CATARACT EXTRACTION W/ INTRAOCULAR LENS  IMPLANT, BILATERAL     CORONARY ANGIOPLASTY WITH STENT PLACEMENT  2000   PCI AND STENTING CIRCUMFLEX   CORONARY ANGIOPLASTY WITH STENT PLACEMENT  04-30-2004  DR VICTORY SHARPS   RE-INSTENT STENOSIS/  DRUG-ELUTING STENT OF PROXIMAL AND MID CIRCUMFLEX/ PCI MID CIRCUMFLEX THROUGH STENT STRUT/  WIDELY PATENT SAPHENOUS VEIN GRAFT AND  LIMA TO LAD GRAFT/ TOTAL OCCLUSION RIGHT CORONARY  BEYOND THE POSTERIOR DESCENDING ARTERY BRANCH WITH 50% OSTIAL NARROWING/ TOTAL OCCLUSION OF LAD AT THE OSTIUM OF THE LEFT MAIN   CORONARY ARTERY BYPASS GRAFT  1988   X5   EVALUATION UNDER ANESTHESIA WITH ANAL FISTULECTOMY N/A 07/22/2012   Procedure: EXAM UNDER ANESTHESIA WITH ANAL fistulotomy;  Surgeon: Bernarda Ned, MD;  Location: [facility];  Service: General;  Laterality: N/A;   LEFT HEART CATH AND CORS/GRAFTS ANGIOGRAPHY N/A 03/31/2018   Procedure: LEFT HEART CATH AND CORS/GRAFTS ANGIOGRAPHY;  Surgeon: Mady Bruckner, MD;  Location: [facility] INVASIVE  CV LAB;  Service: Cardiovascular;  Laterality: N/A;   SHOULDER ARTHROSCOPY WITH OPEN ROTATOR CUFF REPAIR Right 07-10-1999   TONSILLECTOMY AND ADENOIDECTOMY  1944   TRANSTHORACIC ECHOCARDIOGRAM  03-01-2011  DR VICTORY SHARPS   NORMAL LVF AND LV SIZE/ MILD LEFT ATRIAL ENLARGEMENT/ GRADE II DIASTOLIC DYSFUNCTION WITH ELEVATED LEFT ATRIAL PRESSURE/ MILD TO MODERATE MR/ MILD TR   TRANSURETHRAL RESECTION OF PROSTATE  04/09/2011   Procedure: TRANSURETHRAL RESECTION OF THE PROSTATE WITH GYRUS INSTRUMENTS;  Surgeon: Norleen JINNY Seltzer, MD;  Location: [facility];  Service: Urology;  Laterality: N/A;    Allergies  Allergen Reactions   Tape Rash    ONLY USE PAPER TAPE ONLY USE PAPER TAPE   Iodine Rash    Rash when applied to skin   Niacin  And Related Hives and Other (See Comments)    FLUSHING itching    Outpatient Encounter Medications as of 03/19/2023  Medication Sig   acetaminophen  (TYLENOL ) 325 MG tablet Take 650 mg by mouth every 4 (four) hours as needed. (Patient not taking: Reported on 03/24/2023)   alum & mag hydroxide-simeth (MAALOX/MYLANTA) 200-200-20 MG/5ML suspension Take 30 mLs by mouth every 4 (four) hours as needed for indigestion or heartburn. (Patient not taking: Reported on 03/24/2023)   amLODipine  (NORVASC ) 2.5 MG tablet TAKE 1 TABLET BY MOUTH ONCE DAILY   atorvastatin  (LIPITOR) 80 MG tablet Take one tablet nightly for high cholesterol   carbamide peroxide (DEBROX) 6.5 % OTIC solution Place 5 drops into both ears as needed. (Patient not taking: Reported on 03/24/2023)   cetirizine (ZYRTEC) 10 MG chewable tablet Chew 10 mg by mouth as needed for allergies. (Patient not taking: Reported on 03/24/2023)   clobetasol cream (TEMOVATE) 0.05 % Apply topically 2 (two) times daily.   clopidogrel  (PLAVIX ) 75 MG tablet Take 75 mg by mouth daily.   cyanocobalamin  (,VITAMIN B-12,) 1000 MCG/ML injection Inject 1,000 mcg into the muscle every 30 (thirty) days.   dextromethorphan-guaiFENesin  (MUCINEX  DM) 30-600 MG  12hr tablet Take 1 tablet by mouth 2 (two) times daily as needed for cough. (Patient not taking: Reported on 03/24/2023)   dextromethorphan-guaiFENesin  (ROBITUSSIN-DM) 10-100 MG/5ML liquid Take 10 mLs by mouth every 4 (four) hours as needed for cough. (Patient not taking: Reported on 03/24/2023)   Emollient (EUCERIN EX) Apply to BLE and trunk topically two times daily   furosemide  (LASIX ) 20 MG tablet Take 20 mg by mouth as needed. For the overnight weight gain of 3 lbs or for 5lb gain in one week (Patient not taking: Reported on 03/24/2023)   furosemide  (LASIX ) 80 MG tablet Take 80 mg by mouth daily.   Glucose 15 GM/32ML GEL Take 1 packet by mouth as needed. (Patient not taking: Reported on 03/24/2023)   hydrocortisone 2.5 % cream Apply 1 Application topically 2 (two) times daily as needed. (Patient not taking: Reported on 03/24/2023)   isosorbide  mononitrate (IMDUR ) 30 MG 24 hr tablet Take 30 mg by  mouth at bedtime.   isosorbide  mononitrate (IMDUR ) 60 MG 24 hr tablet Take 60 mg by mouth daily after breakfast.   ketoconazole  (NIZORAL ) 2 % cream Apply 1 application topically 2 (two) times daily.   Loteprednol Etabonate 0.2 % EMUL Apply 1 drop to eye as needed. (Patient not taking: Reported on 03/24/2023)   magnesium hydroxide (MILK OF MAGNESIA) 400 MG/5ML suspension Take 30 mLs by mouth daily as needed for mild constipation. (Patient not taking: Reported on 03/24/2023)   metoprolol  succinate (TOPROL -XL) 25 MG 24 hr tablet Take 25 mg by mouth every morning.   nitroGLYCERIN  (NITROSTAT ) 0.4 MG SL tablet PLACE 1 TABLET (0.4 MG TOTAL) UNDER THE TONGUE EVERY 5 (FIVE) MINUTES AS NEEDED FOR CHEST PAIN.   nystatin  (MYCOSTATIN /NYSTOP ) powder Apply 1 Application topically 2 (two) times daily. As needed (Patient not taking: Reported on 03/24/2023)   ondansetron  (ZOFRAN ) 4 MG tablet Take 4 mg by mouth as needed for nausea or vomiting. Up to 3 times a day (Patient not taking: Reported on 03/24/2023)   OXYGEN 2lpm for  dyspnea or SOB (Patient not taking: Reported on 03/24/2023)   pantoprazole  (PROTONIX ) 40 MG tablet Take 1 tablet (40 mg total) by mouth 2 (two) times daily for 2 days.   polyethylene glycol (MIRALAX  / GLYCOLAX ) packet Take 17 g by mouth daily.   potassium chloride  SA (KLOR-CON  M) 20 MEQ tablet TAKE 1 TABLET BY MOUTH ONCE DAILY  *TAKE WITH FOOD* *DO NOT CRUSH OR CHEW* *MAY DISSOLVE*   tamsulosin  (FLOMAX ) 0.4 MG CAPS capsule Take 0.4 mg by mouth daily.   nystatin -triamcinolone  ointment (MYCOLOG) Apply 1 Application topically 2 (two) times daily. (Patient not taking: Reported on 03/24/2023)   No facility-administered encounter medications on file as of 03/19/2023.    Review of Systems  Immunization History  Administered Date(s) Administered   Influenza, High Dose Seasonal PF 12/13/2013, 12/27/2014, 11/02/2015, 11/11/2017   Influenza-Unspecified 11/11/2016, 11/28/2020, 11/28/2021   Moderna Covid-19 Fall Seasonal Vaccine 46yrs & older 05/21/2022, 12/26/2022   Moderna Covid-19 Vaccine  Bivalent Booster 58yrs & up 12/21/2021   Moderna Sars-Covid-2 Vaccination 02/22/2019, 03/22/2019, 12/24/2019   Pneumococcal Conjugate-13 03/14/2014   Pneumococcal Polysaccharide-23 02/11/2013   Tdap 09/19/2015   Unspecified SARS-COV-2 Vaccination 12/24/2019, 06/30/2020, 11/03/2020, 07/10/2021   Zoster Recombinant(Shingrix) 06/12/2022, 10/11/2022   Zoster, Live 02/11/2013   Pertinent  Health Maintenance Due  Topic Date Due   INFLUENZA VACCINE  09/12/2022      04/01/2018    9:40 AM 04/01/2018    8:00 PM 04/13/2018   12:58 PM 03/10/2019    7:17 PM 12/18/2020    2:40 AM  Fall Risk  Falls in the past year?   0 0   (RETIRED) Patient Fall Risk Level High fall risk Moderate fall risk   Low fall risk   Functional Status Survey:    Vitals:   03/19/23 0933 03/19/23 0946  BP: (!) 151/78 139/78  Pulse: 80   Resp: 17   Temp: 98.1 F (36.7 C)   SpO2: 92%   Weight: 199 lb 8 oz (90.5 kg)   Height: 5' 4 (1.626 m)     Body mass index is 34.24 kg/m. Physical Exam Constitutional:      Appearance: Normal appearance.  Cardiovascular:     Rate and Rhythm: Normal rate.     Pulses: Normal pulses.  Pulmonary:     Effort: Pulmonary effort is normal.  Abdominal:     General: Abdomen is flat. Bowel sounds are normal.     Palpations:  Abdomen is soft.  Musculoskeletal:        General: No swelling or tenderness.  Skin:    General: Skin is warm and dry.     Capillary Refill: Capillary refill takes 2 to 3 seconds.  Neurological:     Mental Status: He is alert and oriented to person, place, and time.     Gait: Gait normal.  Psychiatric:        Mood and Affect: Mood normal.     Labs reviewed: Recent Labs    08/03/22 1551 08/12/22 0000 10/30/22 0729 01/16/23 0000  NA 128* 134* 134* 134*  K 3.6 4.0 4.2 4.1  CL 97* 98* 100 98*  CO2 22 24* 26 26*  GLUCOSE 135*  --  103*  --   BUN 16 10 10 10   CREATININE 1.31* 1.2 1.05 1.0  CALCIUM  7.9* 8.7 8.6* 8.4*   Recent Labs    04/15/22 0000 08/03/22 1551 01/16/23 0000  AST 15 21 16   ALT 11 16 12   ALKPHOS 110 114  --   BILITOT  --  0.8  --   PROT  --  6.3*  --   ALBUMIN 3.8 3.6 3.9   Recent Labs    04/15/22 0000 08/03/22 1551 10/30/22 0729 01/16/23 0000  WBC 6.1 5.8 5.9 6.5  NEUTROABS 3,709.00  --   --  4,420.00  HGB 11.2* 10.7* 11.2* 11.0*  HCT 34* 33.1* 35.2* 35*  MCV  --  84.9 85.6  --   PLT 244 228 261 274   Lab Results  Component Value Date   TSH 5.46 01/16/2023   Lab Results  Component Value Date   HGBA1C 5.8 (H) 05/27/2013   Lab Results  Component Value Date   CHOL 106 01/16/2023   HDL 39 01/16/2023   LDLCALC 50 01/16/2023   TRIG 85 01/16/2023   CHOLHDL 3.2 10/11/2021    Significant Diagnostic Results in last 30 days:  No results found.  Assessment/Plan Coronary artery disease of native artery of native heart with stable angina pectoris (HCC)  Chronic heart failure with preserved ejection fraction (HFpEF) (HCC),  Chronic  Stage 3a chronic kidney disease (HCC), Chronic  Mild dementia with other behavioral disturbance, unspecified dementia type [facility]) Discussed with patient concern for episode with staff. Discussed medications that can help with urges (SSRI vs 2nd Gen psychotropics), however, first occurrence. Will monitor for other signs of safety concerns. Most recent renal function within stable range. Denies chest pain at this time. Appears euvolemic on exam. No changes to medications at this time.   Family/ staff Communication: nursing  Labs/tests ordered:  none

## 2023-03-22 NOTE — Progress Notes (Signed)
 Cardiology Clinic Note   Date: 03/24/2023 ID: Jason Velazquez, DOB [birth year], MRN [id number]  Primary Cardiologist:  Sammy Crisp, MD  Chief Complaint   Jason Velazquez is a 89 y.o. male who presents to the clinic today for routine annual follow up.  Patient Profile   Jason Velazquez is followed by Dr. Nolan Battle for the history outlined below.      Past medical history significant for: CAD. CABG x 4 1997: LIMA to LAD, sequential SVG to D1 and D2, SVG to RPL. PCI to proximal LCx/OM 2000. LHC 04/24/2004 (angina): Significant in-stent restenosis proximal LCx.  Widely patent SVG and LIMA.  Total occlusion of LAD at the ostium of LM.  Total occlusion of RCA beyond posterior descending branch. Staged intervention 04/30/2004: PCI with DES proximal to mid LCx covering previously stented region.  PTCA of high-grade restenosis mid LCx via the previously placed stent with reduction from 95% to 80% with TIMI-3 flow. LHC 03/31/2018 (NSTEMI): Severe three-vessel coronary artery disease including chronic total occlusion of the ostial and mid LAD, in-stent restenosis of overlapping proximal LCx stents complicated by stent fracture, sequential 80% OM2 stenoses, 80% jailed AV groove LCx stenosis, 70% proximal RCA lesion, sequential 60% and 70% ostial and distal RPDA stenoses and chronic total occlusion of the rPL. Widely patent LIMA to LAD and SVG to rPL.Jason Velazquez Occlusion of sequential SVG to diagonal branches. Culprit lesion with acute occlusion on chronic subtotal occlusion, given that collaterals have already developed. Mildly to moderately reduced left ventricular systolic function (EF 45%) with mid anterior akinesis. Mildly reduced left ventricular filling pressure. Nuclear stress test 03/16/2021: Moderate to high risk study.  Region of ischemia noted in the basal to distal anterior wall, basal anteroseptal region, distal/apical inferior/inferolateral wall.  Normal wall motion.  No EKG changes concerning for ischemia at  peak stress or in recovery. Chronic diastolic heart failure. Echo 02/26/2022: EF 60 to 65%.  No RWMA.  Mild to moderate LVH.  Grade I DD.  Normal RV size/function.  Normal PA pressure, RVSP 37 mmHg.  Mild MR.  Aortic valve sclerosis without stenosis.  Mild to moderate TR. PAD. Hypertension. Hyperlipidemia. Lipid panel 01/16/2023: LDL 50, HDL 39, TG 85, total 106. GERD. CVA.  In summary, patient with extensive history of CAD s/p CABG times 05/31/1995 and further PCI as outlined above.  In October 2022 patient complained of bilateral clavicular pain and Imdur  dose was increased.  In November he had an episode of central chest pain waking him from sleep lasting 2 to 3 minutes.  He was evaluated in the ED and chest x-ray was concerning for possible left lower lobe infiltrate but CT showed no acute process.  Troponin negative x 2.  He was evaluated in the office in January 2023 with continued episodes of clavicular pain occurring at rest and awakening him from sleep.  He continued to walk a mile 6 days a week without symptoms or limitations.  Nuclear stress testing showed regions of ischemia as detailed above.  The study was reviewed by Dr. Nolan Battle who recommended ongoing medical therapy reserving repeat LHC for worsening or more severe symptoms.  Isosorbide  was increased to 90 mg daily.  In August 2023 patient complained of continued bilateral clavicular pain when lying on his sides in bed at night.  He reported worsening lower extremity edema and increased weight.  Lasix  was increased.  Echo January 2024 showed normal LV/RV function as detailed above.  Patient was last seen in the office by Dr. Nolan Battle on  02/27/2022 for routine follow-up.  He was doing well at that time with resolution of lower extremity edema.  No medication changes were made.     History of Present Illness    Today, patient  is accompanied by his daughter. He reports 2 episodes of sharp, central chest pain without associated symptoms. He does  not recall what he was doing the first episode 4 weeks ago but it resolved quickly with rest and he did not take NTG. The second episode was 1 week ago while walking back to his room from breakfast. He decided to not go to church that day and rested instead. The pain resolved and has not returned. He has been able to participate in all his usual activities without pain or dyspnea. He exercises for 20-25 minutes five days a week at his living facility. On the weekends he takes walks with his rollator. He has not had episodes of chest pain during exercise. He has chronic lower extremity edema that is at its best in the morning and progresses throughout the day. His daughter reports he sits with his leg in a dependent position much of the time. He does not add salt to his meals but has no control over the meals cooked at his living facility and he feels the meals tend to be salty.     ROS: All other systems reviewed and are otherwise negative except as noted in History of Present Illness.  EKGs/Labs Reviewed       No EKG ordered today.   01/16/2023: ALT 12; AST 16; BUN 10; Creatinine 1.0; Potassium 4.1; Sodium 134   01/16/2023: Hemoglobin 11.0; WBC 6.5   01/16/2023: TSH 5.46     Physical Exam    VS:  BP 116/64   Pulse 71   Ht 5\' 5"  (1.651 m)   Wt 209 lb 3.2 oz (94.9 kg)   SpO2 98%   BMI 34.81 kg/m  , BMI Body mass index is 34.81 kg/m.  GEN: Well nourished, well developed, in no acute distress. Neck: No JVD or carotid bruits. Cardiac:  RRR. No murmurs. No rubs or gallops.   Respiratory:  Respirations regular and unlabored. Clear to auscultation without rales, wheezing or rhonchi. GI: Soft, nontender, nondistended. Extremities: Radials/DP/PT 2+ and equal bilaterally. No clubbing or cyanosis. 1+ pitting edema bilateral lower extremities.   Skin: Warm and dry, no rash. Neuro: Strength intact.  Assessment & Plan   CAD S/p CABG x 4 1997 and subsequent PCI with as detailed in patient  profile.  Nuclear stress test February 2023 showed areas of ischemia with recommendation to continue medical treatment.  Patient reports 2 episodes of sharp, central chest pain with the most recent episode 1 week ago while walking that resolved with rest. No shortness of breath or other associated symptoms. He has been chest pain free since and able to participate in his usual activities. He exercises for 20-25 minutes 5 days a week and walks with his rollator on the weekends. Patient's daughter states she would like to start with adjusting patient's medications. Shared decision making to split dosage of isosorbide .  -Isosorbide  60 mg in the morning and 30 mg in the evening.  -Continue Plavix , amlodipine , atorvastatin , Toprol , isosorbide , as needed SL NTG.  Chronic diastolic heart failure Echo January 2024 showed normal LV/RV function, LVH, Grade I DD, normal PA pressure, mild MR, mild to moderate TR.  Patient has chronic lower extremity edema bilaterally that is at its best in the morning and progresses  throughout the day. No shortness of breath, orthopnea or PND. Patient does not add salt to his food but feels the meals at his living facility tend to be salty.  1+ pitting edema bilaterally otherwise euvolemic and well compensated on exam. -Continue Lasix , isosorbide  (as above).  Hypertension BP today 116/64. No report of headaches or dizziness.  -Continue amlodipine , Toprol , isosorbide .  Hyperlipidemia LDL December 2024 50, at goal. -Continue atorvastatin .  Disposition: Return in 6 months or sooner as needed.          Signed, Lonell Rives. Khyle Goodell, DNP, NP-C

## 2023-03-24 ENCOUNTER — Encounter: Payer: Self-pay | Admitting: Student

## 2023-03-24 ENCOUNTER — Ambulatory Visit: Payer: Medicare Other | Attending: Student | Admitting: Student

## 2023-03-24 VITALS — BP 116/64 | HR 71 | Ht 65.0 in | Wt 209.2 lb

## 2023-03-24 DIAGNOSIS — I25708 Atherosclerosis of coronary artery bypass graft(s), unspecified, with other forms of angina pectoris: Secondary | ICD-10-CM | POA: Insufficient documentation

## 2023-03-24 DIAGNOSIS — I5032 Chronic diastolic (congestive) heart failure: Secondary | ICD-10-CM | POA: Diagnosis not present

## 2023-03-24 DIAGNOSIS — I10 Essential (primary) hypertension: Secondary | ICD-10-CM | POA: Insufficient documentation

## 2023-03-24 NOTE — Patient Instructions (Signed)
 Medication Instructions:  Your physician has recommended you make the following change in your medication:   CHANGE Isosorbide  mononitrate (Imdur ) to 60 mg in the AM and 30 mg in the PM  *If you need a refill on your cardiac medications before your next appointment, please call your pharmacy*   Lab Work: None  If you have labs (blood work) drawn today and your tests are completely normal, you will receive your results only by: MyChart Message (if you have MyChart) OR A paper copy in the mail If you have any lab test that is abnormal or we need to change your treatment, we will call you to review the results.   Testing/Procedures: None   Follow-Up: At [facility], you and your health needs are our priority.  As part of our continuing mission to provide you with exceptional heart care, we have created designated Provider Care Teams.  These Care Teams include your primary Cardiologist (physician) and Advanced Practice Providers (APPs -  Physician Assistants and Nurse Practitioners) who all work together to provide you with the care you need, when you need it.   Your next appointment:   6 month(s)  Provider:   Sammy Crisp, MD or Morey Ar, NP

## 2023-04-08 ENCOUNTER — Non-Acute Institutional Stay: Payer: Self-pay | Admitting: Nurse Practitioner

## 2023-04-08 ENCOUNTER — Encounter: Payer: Self-pay | Admitting: Nurse Practitioner

## 2023-04-08 DIAGNOSIS — J069 Acute upper respiratory infection, unspecified: Secondary | ICD-10-CM | POA: Diagnosis not present

## 2023-04-08 NOTE — Progress Notes (Deleted)
 Location:  Other Nursing Home Room Number: [name] Place of Service:  ALF (13)  [name], MD  Patient Care Team: [name], MD as PCP - General (Family Medicine) End, [name], MD as PCP - Cardiology (Cardiology) [name], MD as Referring Physician (Ophthalmology) [name], NP as Nurse Practitioner (Geriatric Medicine)  Extended Emergency Contact Information Primary Emergency Contact: [name] [place] of [place] Mobile Phone: [phone] Relation: Daughter  Goals of care: Advanced Directive information    03/19/2023    9:40 AM  Advanced Directives  Does Patient Have a Medical Advance Directive? Yes  Type of Estate agent of [place];Out of facility DNR (pink MOST or yellow form)  Does patient want to make changes to medical advance directive? No - Patient declined  Copy of Healthcare Power of Attorney in Chart? Yes - validated most recent copy scanned in chart (See row information)     Chief Complaint  Patient presents with   Hoarse    Hoarseness     HPI:  Pt is a 89 y.o. male seen today for medical management of chronic disease. ***   Past Medical History:  Diagnosis Date   Allergic rhinitis    Benign prostatic hypertrophy    CAD (coronary artery disease)    a. 1998 CABG x 4(LIMA-LAD, VG-D1-D2, VG-RPL); b. 2000 & 2006 PCI of the LCX/OM2; c. 03/2018 Cath: LM 25d, LAD 100ost/m, 33m/d, D2 100, fills via collats from D3, LCX 90ost/m ISR w/ evidence of stent fx, 57m, OM2 30 ISR, 80/80, RCA 70p, RPDA 60ost, 70, RPAV 100, VG->D1->D2 100p (culprit), LIMA->LAD nl, VG->RPL2 nl. EF 45%-->Med Rx; d. 03/2021 MV: ant/antsept/apical inf/inflat isch, EF 75%.   Duodenal diverticulum 02/11/2001   GERD (gastroesophageal reflux disease)    H/O hiatal hernia 02/11/2001   Heart murmur    a. 03/2018 Echo: EF 55-60%, DD, mild anterolateral HK.  Mild-mod TR. Mild Ca2+ of AoV and MV.   Hepatic lesion  02/11/2002   History of melanoma excision    FACE   Hypercholesteremia    Hypertension    Left carotid stenosis    > 50%  PER DUPLEX  03-28-2011   Mild mitral regurgitation    Perianal fistula    S/P CABG x 4    1988   S/P drug eluting coronary stent placement    POST CABG--  STENTING 2000  AND RESTENTING 2006 FOR IN-STENT STENOSIS   Stroke [facility])    Past Surgical History:  Procedure Laterality Date   CATARACT EXTRACTION W/ INTRAOCULAR LENS  IMPLANT, BILATERAL     CORONARY ANGIOPLASTY WITH STENT PLACEMENT  2000   PCI AND STENTING CIRCUMFLEX   CORONARY ANGIOPLASTY WITH STENT PLACEMENT  04-30-2004  DR Verdis Prime   RE-INSTENT STENOSIS/  DRUG-ELUTING STENT OF PROXIMAL AND MID CIRCUMFLEX/ PCI MID CIRCUMFLEX THROUGH STENT STRUT/  WIDELY PATENT SAPHENOUS VEIN GRAFT AND  LIMA TO LAD GRAFT/ TOTAL OCCLUSION RIGHT CORONARY BEYOND THE POSTERIOR DESCENDING ARTERY BRANCH WITH 50% OSTIAL NARROWING/ TOTAL OCCLUSION OF LAD AT THE OSTIUM OF THE LEFT MAIN   CORONARY ARTERY BYPASS GRAFT  1988   X5   EVALUATION UNDER ANESTHESIA WITH ANAL FISTULECTOMY N/A 07/22/2012   Procedure: EXAM UNDER ANESTHESIA WITH ANAL fistulotomy;  Surgeon: Romie Levee, MD;  Location: [facility];  Service: General;  Laterality: N/A;   LEFT HEART CATH AND CORS/GRAFTS ANGIOGRAPHY N/A 03/31/2018   Procedure: LEFT HEART CATH AND CORS/GRAFTS ANGIOGRAPHY;  Surgeon: Yvonne Kendall, MD;  Location: [facility]  INVASIVE CV LAB;  Service: Cardiovascular;  Laterality: N/A;   SHOULDER ARTHROSCOPY WITH OPEN ROTATOR CUFF REPAIR Right 07-10-1999   TONSILLECTOMY AND ADENOIDECTOMY  1944   TRANSTHORACIC ECHOCARDIOGRAM  03-01-2011  DR Verdis Prime   NORMAL LVF AND LV SIZE/ MILD LEFT ATRIAL ENLARGEMENT/ GRADE II DIASTOLIC DYSFUNCTION WITH ELEVATED LEFT ATRIAL PRESSURE/ MILD TO MODERATE MR/ MILD TR   TRANSURETHRAL RESECTION OF PROSTATE  04/09/2011   Procedure: TRANSURETHRAL RESECTION OF THE PROSTATE WITH GYRUS INSTRUMENTS;  Surgeon: Anner Crete, MD;  Location: [facility];  Service: Urology;  Laterality: N/A;    Allergies  Allergen Reactions   Tape Rash    ONLY USE PAPER TAPE ONLY USE PAPER TAPE   Iodine Rash    Rash when applied to skin   Niacin And Related Hives and Other (See Comments)    FLUSHING itching    Outpatient Encounter Medications as of 04/08/2023  Medication Sig   acetaminophen (TYLENOL) 325 MG tablet Take 650 mg by mouth every 4 (four) hours as needed.   alum & mag hydroxide-simeth (MAALOX/MYLANTA) 200-200-20 MG/5ML suspension Take 30 mLs by mouth every 4 (four) hours as needed for indigestion or heartburn.   amLODipine (NORVASC) 2.5 MG tablet TAKE 1 TABLET BY MOUTH ONCE DAILY   atorvastatin (LIPITOR) 80 MG tablet Take one tablet nightly for high cholesterol   carbamide peroxide (DEBROX) 6.5 % OTIC solution Place 5 drops into both ears as needed.   cetirizine (ZYRTEC) 10 MG chewable tablet Chew 10 mg by mouth as needed for allergies.   clobetasol cream (TEMOVATE) 0.05 % Apply topically 2 (two) times daily.   clopidogrel (PLAVIX) 75 MG tablet Take 75 mg by mouth daily.   cyanocobalamin (,VITAMIN B-12,) 1000 MCG/ML injection Inject 1,000 mcg into the muscle every 30 (thirty) days.   dextromethorphan-guaiFENesin (MUCINEX DM) 30-600 MG 12hr tablet Take 1 tablet by mouth 2 (two) times daily as needed for cough.   dextromethorphan-guaiFENesin (ROBITUSSIN-DM) 10-100 MG/5ML liquid Take 10 mLs by mouth every 4 (four) hours as needed for cough.   Emollient (EUCERIN EX) Apply to BLE and trunk topically two times daily   furosemide (LASIX) 20 MG tablet Take 20 mg by mouth as needed. For the overnight weight gain of 3 lbs or for 5lb gain in one week   furosemide (LASIX) 80 MG tablet Take 80 mg by mouth daily.   Glucose 15 GM/32ML GEL Take 1 packet by mouth as needed.   hydrocortisone 2.5 % cream Apply 1 Application topically 2 (two) times daily as needed.   isosorbide mononitrate (IMDUR) 30 MG 24 hr tablet Take 30 mg by  mouth at bedtime.   isosorbide mononitrate (IMDUR) 60 MG 24 hr tablet Take 60 mg by mouth daily after breakfast.   ketoconazole (NIZORAL) 2 % cream Apply 1 application topically 2 (two) times daily.   Loteprednol Etabonate 0.2 % EMUL Apply 1 drop to eye as needed. Right Eye   magnesium hydroxide (MILK OF MAGNESIA) 400 MG/5ML suspension Take 30 mLs by mouth daily as needed for mild constipation.   metoprolol succinate (TOPROL-XL) 25 MG 24 hr tablet Take 25 mg by mouth every morning.   nitroGLYCERIN (NITROSTAT) 0.4 MG SL tablet PLACE 1 TABLET (0.4 MG TOTAL) UNDER THE TONGUE EVERY 5 (FIVE) MINUTES AS NEEDED FOR CHEST PAIN.   nystatin (MYCOSTATIN/NYSTOP) powder Apply 1 Application topically 2 (two) times daily. As needed   ondansetron (ZOFRAN) 4 MG tablet Take 4 mg by mouth as needed for nausea or vomiting.  Up to 3 times a day   OXYGEN 2lpm for dyspnea or SOB   pantoprazole (PROTONIX) 40 MG tablet Take 1 tablet (40 mg total) by mouth 2 (two) times daily for 2 days.   polyethylene glycol (MIRALAX / GLYCOLAX) packet Take 17 g by mouth daily.   potassium chloride SA (KLOR-CON M) 20 MEQ tablet TAKE 1 TABLET BY MOUTH ONCE DAILY  *TAKE WITH FOOD* *DO NOT CRUSH OR CHEW* *MAY DISSOLVE*   tamsulosin (FLOMAX) 0.4 MG CAPS capsule Take 0.4 mg by mouth daily.   nystatin-triamcinolone ointment (MYCOLOG) Apply 1 Application topically 2 (two) times daily. (Patient not taking: Reported on 03/19/2023)   No facility-administered encounter medications on file as of 04/08/2023.    Review of Systems ***  Immunization History  Administered Date(s) Administered   Influenza, High Dose Seasonal PF 12/13/2013, 12/27/2014, 11/02/2015, 11/11/2017   Influenza-Unspecified 11/11/2016, 11/28/2020, 11/28/2021   Moderna Covid-19 Fall Seasonal Vaccine 2yrs & older 05/21/2022, 12/26/2022   Moderna Covid-19 Vaccine Bivalent Booster 9yrs & up 12/21/2021   Moderna Sars-Covid-2 Vaccination 02/22/2019, 03/22/2019, 12/24/2019    Pneumococcal Conjugate-13 03/14/2014   Pneumococcal Polysaccharide-23 02/11/2013   Tdap 09/19/2015   Unspecified SARS-COV-2 Vaccination 12/24/2019, 06/30/2020, 11/03/2020, 07/10/2021   Zoster Recombinant(Shingrix) 06/12/2022, 10/11/2022   Zoster, Live 02/11/2013   Pertinent  Health Maintenance Due  Topic Date Due   INFLUENZA VACCINE  09/12/2022      04/01/2018    9:40 AM 04/01/2018    8:00 PM 04/13/2018   12:58 PM 03/10/2019    7:17 PM 12/18/2020    2:40 AM  Fall Risk  Falls in the past year?   0 0   (RETIRED) Patient Fall Risk Level High fall risk Moderate fall risk   Low fall risk   Functional Status Survey:    There were no vitals filed for this visit. There is no height or weight on file to calculate BMI. Physical Exam***  Labs reviewed: Recent Labs    08/03/22 1551 08/12/22 0000 10/30/22 0729 01/16/23 0000  NA 128* 134* 134* 134*  K 3.6 4.0 4.2 4.1  CL 97* 98* 100 98*  CO2 22 24* 26 26*  GLUCOSE 135*  --  103*  --   BUN 16 10 10 10   CREATININE 1.31* 1.2 1.05 1.0  CALCIUM 7.9* 8.7 8.6* 8.4*   Recent Labs    04/15/22 0000 08/03/22 1551 01/16/23 0000  AST 15 21 16   ALT 11 16 12   ALKPHOS 110 114  --   BILITOT  --  0.8  --   PROT  --  6.3*  --   ALBUMIN 3.8 3.6 3.9   Recent Labs    04/15/22 0000 08/03/22 1551 10/30/22 0729 01/16/23 0000  WBC 6.1 5.8 5.9 6.5  NEUTROABS 3,709.00  --   --  4,420.00  HGB 11.2* 10.7* 11.2* 11.0*  HCT 34* 33.1* 35.2* 35*  MCV  --  84.9 85.6  --   PLT 244 228 261 274   Lab Results  Component Value Date   TSH 5.46 01/16/2023   Lab Results  Component Value Date   HGBA1C 5.8 (H) 05/27/2013   Lab Results  Component Value Date   CHOL 106 01/16/2023   HDL 39 01/16/2023   LDLCALC 50 01/16/2023   TRIG 85 01/16/2023   CHOLHDL 3.2 10/11/2021    Significant Diagnostic Results in last 30 days:  No results found.  Assessment/Plan No problem-specific Assessment & Plan notes found for this encounter.  Janene Harvey.  [facility] [phone]

## 2023-04-08 NOTE — Progress Notes (Signed)
 Location:  Other Nursing Home Room Number: [name] Place of Service:  ALF (13)  [name], MD  Patient Care Team: [name], MD as PCP - General (Family Medicine) End, [name], MD as PCP - Cardiology (Cardiology) [name], MD as Referring Physician (Ophthalmology) [name], NP as Nurse Practitioner (Geriatric Medicine)  Extended Emergency Contact Information Primary Emergency Contact: [name] [place] of [place] Mobile Phone: [phone] Relation: Daughter  Goals of care: Advanced Directive information    03/19/2023    9:40 AM  Advanced Directives  Does Patient Have a Medical Advance Directive? Yes  Type of Estate agent of [place];Out of facility DNR (pink MOST or yellow form)  Does patient want to make changes to medical advance directive? No - Patient declined  Copy of Healthcare Power of Attorney in Chart? Yes - validated most recent copy scanned in chart (See row information)     Chief Complaint  Patient presents with   Hoarse    Hoarseness     HPI:  Pt is a 89 y.o. male seen today for an acute visit. He complains of a productive cough that began yesterday. His phlegm is green and thick. He also has post nasal drip.  VS today are: BP 151/78, Temp 98.3 oral, O2 94% RA, HR 88.  He denies any fever, myalgias, chest pain, or shortness of breath with rest. He denies loose stools. He denies contact with anyone who is sick.  He mentions he feels more tired and sleepy. He is able to ambulate to bathroom normally. He states his appetite is normal and he is drinking and urinating normally.   Sometimes when coughing while he is trying to ambulate to the bathroom he gets a bit out of breath.   Staff helps with his medications- he hasn't missed any medications today.   He states he had 2-3 doses of cough syrup which seems to help.   Covid and flu test negative.    Past Medical History:   Diagnosis Date   Allergic rhinitis    Benign prostatic hypertrophy    CAD (coronary artery disease)    a. 1998 CABG x 4(LIMA-LAD, VG-D1-D2, VG-RPL); b. 2000 & 2006 PCI of the LCX/OM2; c. 03/2018 Cath: LM 25d, LAD 100ost/m, 31m/d, D2 100, fills via collats from D3, LCX 90ost/m ISR w/ evidence of stent fx, 22m, OM2 30 ISR, 80/80, RCA 70p, RPDA 60ost, 70, RPAV 100, VG->D1->D2 100p (culprit), LIMA->LAD nl, VG->RPL2 nl. EF 45%-->Med Rx; d. 03/2021 MV: ant/antsept/apical inf/inflat isch, EF 75%.   Duodenal diverticulum 02/11/2001   GERD (gastroesophageal reflux disease)    H/O hiatal hernia 02/11/2001   Heart murmur    a. 03/2018 Echo: EF 55-60%, DD, mild anterolateral HK.  Mild-mod TR. Mild Ca2+ of AoV and MV.   Hepatic lesion 02/11/2002   History of melanoma excision    FACE   Hypercholesteremia    Hypertension    Left carotid stenosis    > 50%  PER DUPLEX  03-28-2011   Mild mitral regurgitation    Perianal fistula    S/P CABG x 4    1988   S/P drug eluting coronary stent placement    POST CABG--  STENTING 2000  AND RESTENTING 2006 FOR IN-STENT STENOSIS   Stroke [facility])    Past Surgical History:  Procedure Laterality Date   CATARACT EXTRACTION W/ INTRAOCULAR LENS  IMPLANT, BILATERAL     CORONARY ANGIOPLASTY WITH STENT PLACEMENT  2000   PCI  AND STENTING CIRCUMFLEX   CORONARY ANGIOPLASTY WITH STENT PLACEMENT  04-30-2004  DR Verdis Prime   RE-INSTENT STENOSIS/  DRUG-ELUTING STENT OF PROXIMAL AND MID CIRCUMFLEX/ PCI MID CIRCUMFLEX THROUGH STENT STRUT/  WIDELY PATENT SAPHENOUS VEIN GRAFT AND  LIMA TO LAD GRAFT/ TOTAL OCCLUSION RIGHT CORONARY BEYOND THE POSTERIOR DESCENDING ARTERY BRANCH WITH 50% OSTIAL NARROWING/ TOTAL OCCLUSION OF LAD AT THE OSTIUM OF THE LEFT MAIN   CORONARY ARTERY BYPASS GRAFT  1988   X5   EVALUATION UNDER ANESTHESIA WITH ANAL FISTULECTOMY N/A 07/22/2012   Procedure: EXAM UNDER ANESTHESIA WITH ANAL fistulotomy;  Surgeon: Romie Levee, MD;  Location: [facility];  Service: General;  Laterality: N/A;   LEFT HEART CATH AND CORS/GRAFTS ANGIOGRAPHY N/A 03/31/2018   Procedure: LEFT HEART CATH AND CORS/GRAFTS ANGIOGRAPHY;  Surgeon: Yvonne Kendall, MD;  Location: [facility] INVASIVE CV LAB;  Service: Cardiovascular;  Laterality: N/A;   SHOULDER ARTHROSCOPY WITH OPEN ROTATOR CUFF REPAIR Right 07-10-1999   TONSILLECTOMY AND ADENOIDECTOMY  1944   TRANSTHORACIC ECHOCARDIOGRAM  03-01-2011  DR Verdis Prime   NORMAL LVF AND LV SIZE/ MILD LEFT ATRIAL ENLARGEMENT/ GRADE II DIASTOLIC DYSFUNCTION WITH ELEVATED LEFT ATRIAL PRESSURE/ MILD TO MODERATE MR/ MILD TR   TRANSURETHRAL RESECTION OF PROSTATE  04/09/2011   Procedure: TRANSURETHRAL RESECTION OF THE PROSTATE WITH GYRUS INSTRUMENTS;  Surgeon: Anner Crete, MD;  Location: [facility];  Service: Urology;  Laterality: N/A;    Allergies  Allergen Reactions   Tape Rash    ONLY USE PAPER TAPE ONLY USE PAPER TAPE   Iodine Rash    Rash when applied to skin   Niacin And Related Hives and Other (See Comments)    FLUSHING itching    Outpatient Encounter Medications as of 04/08/2023  Medication Sig   acetaminophen (TYLENOL) 325 MG tablet Take 650 mg by mouth every 4 (four) hours as needed.   alum & mag hydroxide-simeth (MAALOX/MYLANTA) 200-200-20 MG/5ML suspension Take 30 mLs by mouth every 4 (four) hours as needed for indigestion or heartburn.   amLODipine (NORVASC) 2.5 MG tablet TAKE 1 TABLET BY MOUTH ONCE DAILY   atorvastatin (LIPITOR) 80 MG tablet Take one tablet nightly for high cholesterol   carbamide peroxide (DEBROX) 6.5 % OTIC solution Place 5 drops into both ears as needed.   cetirizine (ZYRTEC) 10 MG chewable tablet Chew 10 mg by mouth as needed for allergies.   clobetasol cream (TEMOVATE) 0.05 % Apply topically 2 (two) times daily.   clopidogrel (PLAVIX) 75 MG tablet Take 75 mg by mouth daily.   cyanocobalamin (,VITAMIN B-12,) 1000 MCG/ML injection Inject 1,000 mcg into the muscle every 30 (thirty) days.    dextromethorphan-guaiFENesin (MUCINEX DM) 30-600 MG 12hr tablet Take 1 tablet by mouth 2 (two) times daily as needed for cough.   dextromethorphan-guaiFENesin (ROBITUSSIN-DM) 10-100 MG/5ML liquid Take 10 mLs by mouth every 4 (four) hours as needed for cough.   Emollient (EUCERIN EX) Apply to BLE and trunk topically two times daily   furosemide (LASIX) 20 MG tablet Take 20 mg by mouth as needed. For the overnight weight gain of 3 lbs or for 5lb gain in one week   furosemide (LASIX) 80 MG tablet Take 80 mg by mouth daily.   Glucose 15 GM/32ML GEL Take 1 packet by mouth as needed.   hydrocortisone 2.5 % cream Apply 1 Application topically 2 (two) times daily as needed.   isosorbide mononitrate (IMDUR) 30 MG 24 hr tablet Take 30 mg by mouth at bedtime.  isosorbide mononitrate (IMDUR) 60 MG 24 hr tablet Take 60 mg by mouth daily after breakfast.   ketoconazole (NIZORAL) 2 % cream Apply 1 application topically 2 (two) times daily.   Loteprednol Etabonate 0.2 % EMUL Apply 1 drop to eye as needed. Right Eye   magnesium hydroxide (MILK OF MAGNESIA) 400 MG/5ML suspension Take 30 mLs by mouth daily as needed for mild constipation.   metoprolol succinate (TOPROL-XL) 25 MG 24 hr tablet Take 25 mg by mouth every morning.   nitroGLYCERIN (NITROSTAT) 0.4 MG SL tablet PLACE 1 TABLET (0.4 MG TOTAL) UNDER THE TONGUE EVERY 5 (FIVE) MINUTES AS NEEDED FOR CHEST PAIN.   nystatin (MYCOSTATIN/NYSTOP) powder Apply 1 Application topically 2 (two) times daily. As needed   ondansetron (ZOFRAN) 4 MG tablet Take 4 mg by mouth as needed for nausea or vomiting. Up to 3 times a day   OXYGEN 2lpm for dyspnea or SOB   pantoprazole (PROTONIX) 40 MG tablet Take 1 tablet (40 mg total) by mouth 2 (two) times daily for 2 days.   polyethylene glycol (MIRALAX / GLYCOLAX) packet Take 17 g by mouth daily.   potassium chloride SA (KLOR-CON M) 20 MEQ tablet TAKE 1 TABLET BY MOUTH ONCE DAILY  *TAKE WITH FOOD* *DO NOT CRUSH OR CHEW* *MAY  DISSOLVE*   tamsulosin (FLOMAX) 0.4 MG CAPS capsule Take 0.4 mg by mouth daily.   nystatin-triamcinolone ointment (MYCOLOG) Apply 1 Application topically 2 (two) times daily. (Patient not taking: Reported on 03/19/2023)   No facility-administered encounter medications on file as of 04/08/2023.    Review of Systems  Constitutional:  Positive for fatigue. Negative for fever.  HENT:  Positive for congestion, postnasal drip and voice change.   Eyes: Negative.   Respiratory:  Positive for cough.   Cardiovascular:  Positive for leg swelling.  Gastrointestinal: Negative.   Endocrine: Negative.   Genitourinary: Negative.   Musculoskeletal: Negative.   Skin: Negative.   Allergic/Immunologic: Negative.   Neurological: Negative.   Hematological: Negative.   Psychiatric/Behavioral: Negative.       Immunization History  Administered Date(s) Administered   Influenza, High Dose Seasonal PF 12/13/2013, 12/27/2014, 11/02/2015, 11/11/2017   Influenza-Unspecified 11/11/2016, 11/28/2020, 11/28/2021   Moderna Covid-19 Fall Seasonal Vaccine 92yrs & older 05/21/2022, 12/26/2022   Moderna Covid-19 Vaccine Bivalent Booster 51yrs & up 12/21/2021   Moderna Sars-Covid-2 Vaccination 02/22/2019, 03/22/2019, 12/24/2019   Pneumococcal Conjugate-13 03/14/2014   Pneumococcal Polysaccharide-23 02/11/2013   Tdap 09/19/2015   Unspecified SARS-COV-2 Vaccination 12/24/2019, 06/30/2020, 11/03/2020, 07/10/2021   Zoster Recombinant(Shingrix) 06/12/2022, 10/11/2022   Zoster, Live 02/11/2013   Pertinent  Health Maintenance Due  Topic Date Due   INFLUENZA VACCINE  09/12/2022      04/01/2018    9:40 AM 04/01/2018    8:00 PM 04/13/2018   12:58 PM 03/10/2019    7:17 PM 12/18/2020    2:40 AM  Fall Risk  Falls in the past year?   0 0   (RETIRED) Patient Fall Risk Level High fall risk Moderate fall risk   Low fall risk   Functional Status Survey:    Vitals:   04/08/23 1239  BP: (!) 151/78   There is no height or  weight on file to calculate BMI. Physical Exam Vitals reviewed.  Constitutional:      Appearance: Normal appearance.  HENT:     Head: Normocephalic and atraumatic.     Right Ear: External ear normal.     Left Ear: External ear normal.  Nose: Congestion present.     Mouth/Throat:     Mouth: Mucous membranes are moist.     Pharynx: Oropharynx is clear. No oropharyngeal exudate or posterior oropharyngeal erythema.  Eyes:     Conjunctiva/sclera: Conjunctivae normal.  Cardiovascular:     Rate and Rhythm: Normal rate and regular rhythm.     Pulses: Normal pulses.     Heart sounds: Normal heart sounds.  Pulmonary:     Effort: Pulmonary effort is normal.     Breath sounds: Normal breath sounds.  Abdominal:     General: Bowel sounds are normal.  Musculoskeletal:     Right lower leg: Edema present.     Left lower leg: Edema present.  Skin:    General: Skin is warm and dry.     Capillary Refill: Capillary refill takes less than 2 seconds.  Neurological:     Mental Status: He is alert and oriented to person, place, and time. Mental status is at baseline.  Psychiatric:        Mood and Affect: Mood normal.        Behavior: Behavior normal.     Labs reviewed: Recent Labs    08/03/22 1551 08/12/22 0000 10/30/22 0729 01/16/23 0000  NA 128* 134* 134* 134*  K 3.6 4.0 4.2 4.1  CL 97* 98* 100 98*  CO2 22 24* 26 26*  GLUCOSE 135*  --  103*  --   BUN 16 10 10 10   CREATININE 1.31* 1.2 1.05 1.0  CALCIUM 7.9* 8.7 8.6* 8.4*   Recent Labs    04/15/22 0000 08/03/22 1551 01/16/23 0000  AST 15 21 16   ALT 11 16 12   ALKPHOS 110 114  --   BILITOT  --  0.8  --   PROT  --  6.3*  --   ALBUMIN 3.8 3.6 3.9   Recent Labs    04/15/22 0000 08/03/22 1551 10/30/22 0729 01/16/23 0000  WBC 6.1 5.8 5.9 6.5  NEUTROABS 3,709.00  --   --  4,420.00  HGB 11.2* 10.7* 11.2* 11.0*  HCT 34* 33.1* 35.2* 35*  MCV  --  84.9 85.6  --   PLT 244 228 261 274   Lab Results  Component Value Date    TSH 5.46 01/16/2023   Lab Results  Component Value Date   HGBA1C 5.8 (H) 05/27/2013   Lab Results  Component Value Date   CHOL 106 01/16/2023   HDL 39 01/16/2023   LDLCALC 50 01/16/2023   TRIG 85 01/16/2023   CHOLHDL 3.2 10/11/2021    Significant Diagnostic Results in last 30 days:  No results found.  Assessment/Plan  1. Viral upper respiratory tract infection (Primary) - COVID and Flu test negative - Lungs clear - No throat erythema noted  - Nasal turbinates dry - Supportive care for viral upper respiratory tract infection - Continue nasal spray as needed throughout the day -can use humidifier in the room  - Increase hydration - Take cetirizine 10 mg tablet by mouth daily x 7 days instead of PRN - Take Mucinex DM 30-600 mg 12 hour tablet by mouth twice a day x 7 days instead of PRN (take with full glass of water) - Continue Robitussin DM liquid 10 mL by mouth every 4 hours as needed for cough relief - Continue Tylenol 650 mg tablets by mouth every 4 hours as needed for any myalgias. -vit c 1000 mg daily x 14 days -zinc 50 mg daily   Lenord Fellers, DNP -  AGPCNP Student [facility] -I personally was present during the history, physical exam and medical decision-making activities of this service and have verified that the service and findings are accurately documented in the student's note Aayush Gelpi K. [facility] [phone]

## 2023-04-10 ENCOUNTER — Telehealth: Payer: Self-pay | Admitting: Nurse Practitioner

## 2023-04-10 DIAGNOSIS — I10 Essential (primary) hypertension: Secondary | ICD-10-CM | POA: Diagnosis not present

## 2023-04-10 DIAGNOSIS — I5032 Chronic diastolic (congestive) heart failure: Secondary | ICD-10-CM | POA: Diagnosis not present

## 2023-04-10 DIAGNOSIS — R0602 Shortness of breath: Secondary | ICD-10-CM | POA: Diagnosis not present

## 2023-04-10 DIAGNOSIS — R0989 Other specified symptoms and signs involving the circulatory and respiratory systems: Secondary | ICD-10-CM | POA: Diagnosis not present

## 2023-04-10 DIAGNOSIS — I2510 Atherosclerotic heart disease of native coronary artery without angina pectoris: Secondary | ICD-10-CM | POA: Diagnosis not present

## 2023-04-10 LAB — CBC AND DIFFERENTIAL
HCT: 27 — AB (ref 41–53)
Hemoglobin: 8.9 — AB (ref 13.5–17.5)
Neutrophils Absolute: 5415
Platelets: 232 10*3/uL (ref 150–400)
WBC: 7.5

## 2023-04-10 LAB — BASIC METABOLIC PANEL
BUN: 11 (ref 4–21)
CO2: 26 — AB (ref 13–22)
Chloride: 93 — AB (ref 99–108)
Creatinine: 1.2 (ref 0.6–1.3)
Glucose: 133
Potassium: 3.7 meq/L (ref 3.5–5.1)
Sodium: 125 — AB (ref 137–147)

## 2023-04-10 LAB — COMPREHENSIVE METABOLIC PANEL
Calcium: 7.6 — AB (ref 8.7–10.7)
eGFR: 58

## 2023-04-10 LAB — CBC: RBC: 3.36 — AB (ref 3.87–5.11)

## 2023-04-10 MED ORDER — DOXYCYCLINE HYCLATE 100 MG PO TABS: 100.0000 mg | ORAL_TABLET | Freq: Two times a day (BID) | ORAL | 0 refills | Status: DC

## 2023-04-10 NOTE — Telephone Encounter (Signed)
 Nursing notes Weight is up from baseline of 200 to 207 in just 2 days. Has some increased BLE edema. Slight temp. at 99.2, O2 92% on RA. Cough is worse and more lethargic. Has received his PRN Lasix, Tylenol, Robitussin.  Will get CBC, CMP and chest xray at this time.  Start doxycycline 100 mg By mouth BID for 7 days

## 2023-04-11 ENCOUNTER — Encounter: Payer: Self-pay | Admitting: Student

## 2023-04-11 ENCOUNTER — Non-Acute Institutional Stay: Payer: Self-pay | Admitting: Student

## 2023-04-11 DIAGNOSIS — I25118 Atherosclerotic heart disease of native coronary artery with other forms of angina pectoris: Secondary | ICD-10-CM | POA: Diagnosis not present

## 2023-04-11 DIAGNOSIS — I5032 Chronic diastolic (congestive) heart failure: Secondary | ICD-10-CM | POA: Diagnosis not present

## 2023-04-11 DIAGNOSIS — M7989 Other specified soft tissue disorders: Secondary | ICD-10-CM | POA: Diagnosis not present

## 2023-04-11 NOTE — Progress Notes (Signed)
 Location:  Other [place].  Nursing Home Room Number: [name] Place of Service:  ALF [phone]) Provider:  [name], MD  Patient Care Team: [name], MD as PCP - General (Family Medicine) End, [name], MD as PCP - Cardiology (Cardiology) [name], MD as Referring Physician (Ophthalmology) [name], NP as Nurse Practitioner (Geriatric Medicine)  Extended Emergency Contact Information Primary Emergency Contact: [name] [place] of [place] Mobile Phone: [phone] Relation: Daughter  Code Status:  DNR Goals of care: Advanced Directive information    03/19/2023    9:40 AM  Advanced Directives  Does Patient Have a Medical Advance Directive? Yes  Type of Estate agent of [place];Out of facility DNR (pink MOST or yellow form)  Does patient want to make changes to medical advance directive? No - Patient declined  Copy of Healthcare Power of Attorney in Chart? Yes - validated most recent copy scanned in chart (See row information)     Chief Complaint  Patient presents with   Weight Gain    Weight Gain and Leg Swelling.     HPI:  Pt is a 89 y.o. male seen today for an acute visit for Weight Gain and Leg Swelling.  History of Present Illness The patient is a 89 year old male with atrial fibrillation and congestive heart failure who presents with a ten pound weight gain and lower extremity edema.  He has experienced a ten pound weight gain and noticeable lower extremity edema. No chest pain or shortness of breath, although there is dyspnea on exertion. He is currently taking Lasix 40 mg twice daily.  He has been more sleepy throughout the day and has had more confusion over the last few months, raising significant concern for continued cognitive decline. This cognitive decline may be contributing to his functional incontinence.  He has increased urinary incontinence, which is partially functional  due to memory loss. The use of Lasix has been associated with increased urinary incontinence.  Past Medical History - History of atrial fibrillation - History of congestive heart failure (CHF)   Past Medical History:  Diagnosis Date   Allergic rhinitis    Benign prostatic hypertrophy    CAD (coronary artery disease)    a. 1998 CABG x 4(LIMA-LAD, VG-D1-D2, VG-RPL); b. 2000 & 2006 PCI of the LCX/OM2; c. 03/2018 Cath: LM 25d, LAD 100ost/m, 55m/d, D2 100, fills via collats from D3, LCX 90ost/m ISR w/ evidence of stent fx, 86m, OM2 30 ISR, 80/80, RCA 70p, RPDA 60ost, 70, RPAV 100, VG->D1->D2 100p (culprit), LIMA->LAD nl, VG->RPL2 nl. EF 45%-->Med Rx; d. 03/2021 MV: ant/antsept/apical inf/inflat isch, EF 75%.   Duodenal diverticulum 02/11/2001   GERD (gastroesophageal reflux disease)    H/O hiatal hernia 02/11/2001   Heart murmur    a. 03/2018 Echo: EF 55-60%, DD, mild anterolateral HK.  Mild-mod TR. Mild Ca2+ of AoV and MV.   Hepatic lesion 02/11/2002   History of melanoma excision    FACE   Hypercholesteremia    Hypertension    Left carotid stenosis    > 50%  PER DUPLEX  03-28-2011   Mild mitral regurgitation    Perianal fistula    S/P CABG x 4    1988   S/P drug eluting coronary stent placement    POST CABG--  STENTING 2000  AND RESTENTING 2006 FOR IN-STENT STENOSIS   Stroke [facility])    Past Surgical History:  Procedure Laterality Date   CATARACT EXTRACTION W/ INTRAOCULAR LENS  IMPLANT, BILATERAL     CORONARY ANGIOPLASTY WITH STENT PLACEMENT  2000   PCI AND STENTING CIRCUMFLEX   CORONARY ANGIOPLASTY WITH STENT PLACEMENT  04-30-2004  DR Verdis Prime   RE-INSTENT STENOSIS/  DRUG-ELUTING STENT OF PROXIMAL AND MID CIRCUMFLEX/ PCI MID CIRCUMFLEX THROUGH STENT STRUT/  WIDELY PATENT SAPHENOUS VEIN GRAFT AND  LIMA TO LAD GRAFT/ TOTAL OCCLUSION RIGHT CORONARY BEYOND THE POSTERIOR DESCENDING ARTERY BRANCH WITH 50% OSTIAL NARROWING/ TOTAL OCCLUSION OF LAD AT THE OSTIUM OF THE LEFT MAIN   CORONARY  ARTERY BYPASS GRAFT  1988   X5   EVALUATION UNDER ANESTHESIA WITH ANAL FISTULECTOMY N/A 07/22/2012   Procedure: EXAM UNDER ANESTHESIA WITH ANAL fistulotomy;  Surgeon: Romie Levee, MD;  Location: [facility];  Service: General;  Laterality: N/A;   LEFT HEART CATH AND CORS/GRAFTS ANGIOGRAPHY N/A 03/31/2018   Procedure: LEFT HEART CATH AND CORS/GRAFTS ANGIOGRAPHY;  Surgeon: Yvonne Kendall, MD;  Location: [facility] INVASIVE CV LAB;  Service: Cardiovascular;  Laterality: N/A;   SHOULDER ARTHROSCOPY WITH OPEN ROTATOR CUFF REPAIR Right 07-10-1999   TONSILLECTOMY AND ADENOIDECTOMY  1944   TRANSTHORACIC ECHOCARDIOGRAM  03-01-2011  DR Verdis Prime   NORMAL LVF AND LV SIZE/ MILD LEFT ATRIAL ENLARGEMENT/ GRADE II DIASTOLIC DYSFUNCTION WITH ELEVATED LEFT ATRIAL PRESSURE/ MILD TO MODERATE MR/ MILD TR   TRANSURETHRAL RESECTION OF PROSTATE  04/09/2011   Procedure: TRANSURETHRAL RESECTION OF THE PROSTATE WITH GYRUS INSTRUMENTS;  Surgeon: Anner Crete, MD;  Location: [facility];  Service: Urology;  Laterality: N/A;    Allergies  Allergen Reactions   Tape Rash    ONLY USE PAPER TAPE ONLY USE PAPER TAPE   Iodine Rash    Rash when applied to skin   Niacin And Related Hives and Other (See Comments)    FLUSHING itching    Outpatient Encounter Medications as of 04/11/2023  Medication Sig   acetaminophen (TYLENOL) 325 MG tablet Take 650 mg by mouth every 4 (four) hours as needed.   alum & mag hydroxide-simeth (MAALOX/MYLANTA) 200-200-20 MG/5ML suspension Take 30 mLs by mouth every 4 (four) hours as needed for indigestion or heartburn.   amLODipine (NORVASC) 2.5 MG tablet TAKE 1 TABLET BY MOUTH ONCE DAILY   Ascorbic Acid (VITAMIN C) 1000 MG tablet Take 1,000 mg by mouth daily.   atorvastatin (LIPITOR) 80 MG tablet Take one tablet nightly for high cholesterol   carbamide peroxide (DEBROX) 6.5 % OTIC solution Place 5 drops into both ears as needed.   cetirizine (ZYRTEC) 10 MG chewable tablet Chew 10 mg by  mouth as needed for allergies.   clobetasol cream (TEMOVATE) 0.05 % Apply topically 2 (two) times daily.   clopidogrel (PLAVIX) 75 MG tablet Take 75 mg by mouth daily.   cyanocobalamin (,VITAMIN B-12,) 1000 MCG/ML injection Inject 1,000 mcg into the muscle every 30 (thirty) days.   dextromethorphan-guaiFENesin (MUCINEX DM) 30-600 MG 12hr tablet Take 1 tablet by mouth 2 (two) times daily as needed for cough.   dextromethorphan-guaiFENesin (ROBITUSSIN-DM) 10-100 MG/5ML liquid Take 10 mLs by mouth every 4 (four) hours as needed for cough.   doxycycline (VIBRA-TABS) 100 MG tablet Take 1 tablet (100 mg total) by mouth 2 (two) times daily.   Emollient (EUCERIN EX) Apply to BLE and trunk topically two times daily   furosemide (LASIX) 20 MG tablet Take 20 mg by mouth as needed. For the overnight weight gain of 3 lbs or for 5lb gain in one week. Take one tablet by mouth in the morning  for  3 days.   furosemide (LASIX) 80 MG tablet Take 80 mg by mouth daily.   Glucose 15 GM/32ML GEL Take 1 packet by mouth as needed.   hydrocortisone 2.5 % cream Apply 1 Application topically 2 (two) times daily as needed.   isosorbide mononitrate (IMDUR) 30 MG 24 hr tablet Take 30 mg by mouth at bedtime.   isosorbide mononitrate (IMDUR) 60 MG 24 hr tablet Take 60 mg by mouth daily after breakfast.   ketoconazole (NIZORAL) 2 % cream Apply 1 application topically 2 (two) times daily.   Loteprednol Etabonate 0.2 % EMUL Apply 1 drop to eye as needed. Right Eye   magnesium hydroxide (MILK OF MAGNESIA) 400 MG/5ML suspension Take 30 mLs by mouth daily as needed for mild constipation.   metoprolol succinate (TOPROL-XL) 25 MG 24 hr tablet Take 25 mg by mouth every morning.   nitroGLYCERIN (NITROSTAT) 0.4 MG SL tablet PLACE 1 TABLET (0.4 MG TOTAL) UNDER THE TONGUE EVERY 5 (FIVE) MINUTES AS NEEDED FOR CHEST PAIN.   nystatin (MYCOSTATIN/NYSTOP) powder Apply 1 Application topically 2 (two) times daily. As needed   ondansetron (ZOFRAN) 4  MG tablet Take 4 mg by mouth as needed for nausea or vomiting. Up to 3 times a day   OXYGEN 2lpm for dyspnea or SOB   pantoprazole (PROTONIX) 40 MG tablet Take 1 tablet (40 mg total) by mouth 2 (two) times daily for 2 days.   polyethylene glycol (MIRALAX / GLYCOLAX) packet Take 17 g by mouth daily.   potassium chloride SA (KLOR-CON M) 20 MEQ tablet TAKE 1 TABLET BY MOUTH ONCE DAILY  *TAKE WITH FOOD* *DO NOT CRUSH OR CHEW* *MAY DISSOLVE*   tamsulosin (FLOMAX) 0.4 MG CAPS capsule Take 0.4 mg by mouth daily.   zinc gluconate 50 MG tablet Take 50 mg by mouth daily.   nystatin-triamcinolone ointment (MYCOLOG) Apply 1 Application topically 2 (two) times daily. (Patient not taking: Reported on 04/11/2023)   No facility-administered encounter medications on file as of 04/11/2023.    Review of Systems  Immunization History  Administered Date(s) Administered   Influenza, High Dose Seasonal PF 12/13/2013, 12/27/2014, 11/02/2015, 11/11/2017   Influenza-Unspecified 11/11/2016, 11/28/2020, 11/28/2021   Moderna Covid-19 Fall Seasonal Vaccine 72yrs & older 05/21/2022, 12/26/2022   Moderna Covid-19 Vaccine Bivalent Booster 91yrs & up 12/21/2021   Moderna Sars-Covid-2 Vaccination 02/22/2019, 03/22/2019, 12/24/2019   Pneumococcal Conjugate-13 03/14/2014   Pneumococcal Polysaccharide-23 02/11/2013   Tdap 09/19/2015   Unspecified SARS-COV-2 Vaccination 12/24/2019, 06/30/2020, 11/03/2020, 07/10/2021   Zoster Recombinant(Shingrix) 06/12/2022, 10/11/2022   Zoster, Live 02/11/2013   Pertinent  Health Maintenance Due  Topic Date Due   INFLUENZA VACCINE  09/12/2022      04/01/2018    9:40 AM 04/01/2018    8:00 PM 04/13/2018   12:58 PM 03/10/2019    7:17 PM 12/18/2020    2:40 AM  Fall Risk  Falls in the past year?   0 0   (RETIRED) Patient Fall Risk Level High fall risk Moderate fall risk   Low fall risk   Functional Status Survey:    Vitals:   04/11/23 1633  BP: 111/68  Pulse: 89  Resp: 18  Temp:  98.3 F (36.8 C)  SpO2: 91%  Weight: 208 lb 8 oz (94.6 kg)  Height: 5\' 4"  (1.626 m)   Body mass index is 35.79 kg/m. Physical Exam Constitutional:      Appearance: He is obese.  Cardiovascular:     Rate and Rhythm: Normal rate.  Pulses: Normal pulses.  Pulmonary:     Effort: Pulmonary effort is normal.     Breath sounds: Normal breath sounds.  Abdominal:     General: Abdomen is flat.     Palpations: Abdomen is soft.  Musculoskeletal:        General: Swelling present.  Skin:    General: Skin is warm and dry.  Neurological:     Mental Status: He is alert. Mental status is at baseline.     Labs reviewed: Recent Labs    08/03/22 1551 08/12/22 0000 10/30/22 0729 01/16/23 0000 04/10/23 0000  NA 128*   < > 134* 134* 125*  K 3.6   < > 4.2 4.1 3.7  CL 97*   < > 100 98* 93*  CO2 22   < > 26 26* 26*  GLUCOSE 135*  --  103*  --   --   BUN 16   < > 10 10 11   CREATININE 1.31*   < > 1.05 1.0 1.2  CALCIUM 7.9*   < > 8.6* 8.4* 7.6*   < > = values in this interval not displayed.   Recent Labs    04/15/22 0000 08/03/22 1551 01/16/23 0000  AST 15 21 16   ALT 11 16 12   ALKPHOS 110 114  --   BILITOT  --  0.8  --   PROT  --  6.3*  --   ALBUMIN 3.8 3.6 3.9   Recent Labs    04/15/22 0000 04/15/22 0000 08/03/22 1551 10/30/22 0729 01/16/23 0000 04/10/23 0000  WBC 6.1   < > 5.8 5.9 6.5 7.5  NEUTROABS 3,709.00  --   --   --  4,420.00 5,415.00  HGB 11.2*  --  10.7* 11.2* 11.0* 8.9*  HCT 34*  --  33.1* 35.2* 35* 27*  MCV  --   --  84.9 85.6  --   --   PLT 244  --  228 261 274 232   < > = values in this interval not displayed.   Lab Results  Component Value Date   TSH 5.46 01/16/2023   Lab Results  Component Value Date   HGBA1C 5.8 (H) 05/27/2013   Lab Results  Component Value Date   CHOL 106 01/16/2023   HDL 39 01/16/2023   LDLCALC 50 01/16/2023   TRIG 85 01/16/2023   CHOLHDL 3.2 10/11/2021    Significant Diagnostic Results in last 30 days:  No results  found.  Assessment/Plan Congestive Heart Failure (CHF) 89 year old with CHF presents with a 10-pound weight gain, lower extremity edema, increased sleepiness, and dyspnea on exertion. No chest pain or shortness of breath. Increased urinary incontinence with Lasix dosing, partially functional due to memory loss, and notable confusion over the last few months, indicating potential decline. Discussed risks of Lasix including increased urinary incontinence and potential electrolyte imbalance. Benefits include reduction in edema and weight. Alternatives include adjusting current medications or considering other diuretics. Anticipated outcome is symptom improvement with close monitoring. - Administer Lasix 80 mg BID for 3 days - Potassium 40 meq x3 days - Order BMP, CBC, BNP, chest X-ray, urinalysis, and urine culture to be collected on Monday  Atrial Fibrillation Atrial fibrillation with no specific symptoms or changes discussed during this visit. - Continue all other medications as previously prescribed  General Health Maintenance Routine health maintenance and monitoring for a geriatric patient with multiple comorbidities. - Ensure follow-up for lab results and imaging studies.  Family/ staff Communication: nursing  Labs/tests  ordered:  as ordered above.   I spent greater than 30 minutes for the care of this patient in face to face time, chart review, clinical documentation, patient education.

## 2023-04-13 ENCOUNTER — Encounter: Payer: Self-pay | Admitting: Student

## 2023-04-13 DIAGNOSIS — E66811 Obesity, class 1: Secondary | ICD-10-CM | POA: Insufficient documentation

## 2023-04-14 DIAGNOSIS — I10 Essential (primary) hypertension: Secondary | ICD-10-CM | POA: Diagnosis not present

## 2023-04-14 DIAGNOSIS — I2510 Atherosclerotic heart disease of native coronary artery without angina pectoris: Secondary | ICD-10-CM | POA: Diagnosis not present

## 2023-04-14 DIAGNOSIS — E538 Deficiency of other specified B group vitamins: Secondary | ICD-10-CM | POA: Diagnosis not present

## 2023-04-14 DIAGNOSIS — I5032 Chronic diastolic (congestive) heart failure: Secondary | ICD-10-CM | POA: Diagnosis not present

## 2023-04-14 LAB — COMPREHENSIVE METABOLIC PANEL
Calcium: 8.1 — AB (ref 8.7–10.7)
eGFR: 54

## 2023-04-14 LAB — BASIC METABOLIC PANEL
BUN: 12 (ref 4–21)
CO2: 28 — AB (ref 13–22)
Chloride: 90 — AB (ref 99–108)
Creatinine: 1.3 (ref 0.6–1.3)
Glucose: 145
Potassium: 3.7 meq/L (ref 3.5–5.1)
Sodium: 126 — AB (ref 137–147)

## 2023-05-01 ENCOUNTER — Non-Acute Institutional Stay: Payer: Self-pay | Admitting: Nurse Practitioner

## 2023-05-01 ENCOUNTER — Encounter: Payer: Self-pay | Admitting: Nurse Practitioner

## 2023-05-01 DIAGNOSIS — M545 Low back pain, unspecified: Secondary | ICD-10-CM | POA: Diagnosis not present

## 2023-05-01 NOTE — Progress Notes (Unsigned)
 Location:  Other Nursing Home Room Number: [name]M Place of Service:  ALF (13)  [name], MD  Patient Care Team: [name], MD as PCP - General (Family Medicine) End, [name], MD as PCP - Cardiology (Cardiology) [name], MD as Referring Physician (Ophthalmology) [name], NP as Nurse Practitioner (Geriatric Medicine)  Extended Emergency Contact Information Primary Emergency Contact: [name] [place] of [place] Mobile Phone: [phone] Relation: Daughter  Goals of care: Advanced Directive information    03/19/2023    9:40 AM  Advanced Directives  Does Patient Have a Medical Advance Directive? Yes  Type of Estate agent of [place];Out of facility DNR (pink MOST or yellow form)  Does patient want to make changes to medical advance directive? No - Patient declined  Copy of Healthcare Power of Attorney in Chart? Yes - validated most recent copy scanned in chart (See row information)     No chief complaint on file.   HPI:  Pt is a 89 y.o. male seen today for an acute visit for ***   Past Medical History:  Diagnosis Date   Allergic rhinitis    Benign prostatic hypertrophy    CAD (coronary artery disease)    a. 1998 CABG x 4(LIMA-LAD, VG-D1-D2, VG-RPL); b. 2000 & 2006 PCI of the LCX/OM2; c. 03/2018 Cath: LM 25d, LAD 100ost/m, 63m/d, D2 100, fills via collats from D3, LCX 90ost/m ISR w/ evidence of stent fx, 63m, OM2 30 ISR, 80/80, RCA 70p, RPDA 60ost, 70, RPAV 100, VG->D1->D2 100p (culprit), LIMA->LAD nl, VG->RPL2 nl. EF 45%-->Med Rx; d. 03/2021 MV: ant/antsept/apical inf/inflat isch, EF 75%.   Duodenal diverticulum 02/11/2001   GERD (gastroesophageal reflux disease)    H/O hiatal hernia 02/11/2001   Heart murmur    a. 03/2018 Echo: EF 55-60%, DD, mild anterolateral HK.  Mild-mod TR. Mild Ca2+ of AoV and MV.   Hepatic lesion 02/11/2002   History of melanoma excision    FACE    Hypercholesteremia    Hypertension    Left carotid stenosis    > 50%  PER DUPLEX  03-28-2011   Mild mitral regurgitation    Perianal fistula    S/P CABG x 4    1988   S/P drug eluting coronary stent placement    POST CABG--  STENTING 2000  AND RESTENTING 2006 FOR IN-STENT STENOSIS   Stroke [facility])    Past Surgical History:  Procedure Laterality Date   CATARACT EXTRACTION W/ INTRAOCULAR LENS  IMPLANT, BILATERAL     CORONARY ANGIOPLASTY WITH STENT PLACEMENT  2000   PCI AND STENTING CIRCUMFLEX   CORONARY ANGIOPLASTY WITH STENT PLACEMENT  04-30-2004  DR Verdis Prime   RE-INSTENT STENOSIS/  DRUG-ELUTING STENT OF PROXIMAL AND MID CIRCUMFLEX/ PCI MID CIRCUMFLEX THROUGH STENT STRUT/  WIDELY PATENT SAPHENOUS VEIN GRAFT AND  LIMA TO LAD GRAFT/ TOTAL OCCLUSION RIGHT CORONARY BEYOND THE POSTERIOR DESCENDING ARTERY BRANCH WITH 50% OSTIAL NARROWING/ TOTAL OCCLUSION OF LAD AT THE OSTIUM OF THE LEFT MAIN   CORONARY ARTERY BYPASS GRAFT  1988   X5   EVALUATION UNDER ANESTHESIA WITH ANAL FISTULECTOMY N/A 07/22/2012   Procedure: EXAM UNDER ANESTHESIA WITH ANAL fistulotomy;  Surgeon: Romie Levee, MD;  Location: [facility] [place];  Service: General;  Laterality: N/A;   LEFT HEART CATH AND CORS/GRAFTS ANGIOGRAPHY N/A 03/31/2018   Procedure: LEFT HEART CATH AND CORS/GRAFTS ANGIOGRAPHY;  Surgeon: Yvonne Kendall, MD;  Location: [facility] INVASIVE CV LAB;  Service: Cardiovascular;  Laterality: N/A;  SHOULDER ARTHROSCOPY WITH OPEN ROTATOR CUFF REPAIR Right 07-10-1999   TONSILLECTOMY AND ADENOIDECTOMY  1944   TRANSTHORACIC ECHOCARDIOGRAM  03-01-2011  DR Verdis Prime   NORMAL LVF AND LV SIZE/ MILD LEFT ATRIAL ENLARGEMENT/ GRADE II DIASTOLIC DYSFUNCTION WITH ELEVATED LEFT ATRIAL PRESSURE/ MILD TO MODERATE MR/ MILD TR   TRANSURETHRAL RESECTION OF PROSTATE  04/09/2011   Procedure: TRANSURETHRAL RESECTION OF THE PROSTATE WITH GYRUS INSTRUMENTS;  Surgeon: Anner Crete, MD;  Location: [facility];  Service: Urology;   Laterality: N/A;    Allergies  Allergen Reactions   Tape Rash    ONLY USE PAPER TAPE ONLY USE PAPER TAPE   Iodine Rash    Rash when applied to skin   Niacin And Related Hives and Other (See Comments)    FLUSHING itching    Outpatient Encounter Medications as of 05/01/2023  Medication Sig   acetaminophen (TYLENOL) 325 MG tablet Take 650 mg by mouth every 4 (four) hours as needed.   alum & mag hydroxide-simeth (MAALOX/MYLANTA) 200-200-20 MG/5ML suspension Take 30 mLs by mouth every 4 (four) hours as needed for indigestion or heartburn.   amLODipine (NORVASC) 2.5 MG tablet TAKE 1 TABLET BY MOUTH ONCE DAILY   Ascorbic Acid (VITAMIN C) 1000 MG tablet Take 1,000 mg by mouth daily.   atorvastatin (LIPITOR) 80 MG tablet Take one tablet nightly for high cholesterol   carbamide peroxide (DEBROX) 6.5 % OTIC solution Place 5 drops into both ears as needed.   cetirizine (ZYRTEC) 10 MG chewable tablet Chew 10 mg by mouth as needed for allergies.   clobetasol cream (TEMOVATE) 0.05 % Apply topically 2 (two) times daily.   clopidogrel (PLAVIX) 75 MG tablet Take 75 mg by mouth daily.   cyanocobalamin (,VITAMIN B-12,) 1000 MCG/ML injection Inject 1,000 mcg into the muscle every 30 (thirty) days.   dextromethorphan-guaiFENesin (MUCINEX DM) 30-600 MG 12hr tablet Take 1 tablet by mouth 2 (two) times daily as needed for cough.   dextromethorphan-guaiFENesin (ROBITUSSIN-DM) 10-100 MG/5ML liquid Take 10 mLs by mouth every 4 (four) hours as needed for cough.   doxycycline (VIBRA-TABS) 100 MG tablet Take 1 tablet (100 mg total) by mouth 2 (two) times daily.   Emollient (EUCERIN EX) Apply to BLE and trunk topically two times daily   furosemide (LASIX) 20 MG tablet Take 20 mg by mouth as needed. For the overnight weight gain of 3 lbs or for 5lb gain in one week. Take one tablet by mouth in the morning  for 3 days.   furosemide (LASIX) 80 MG tablet Take 80 mg by mouth daily.   Glucose 15 GM/32ML GEL Take 1 packet  by mouth as needed.   hydrocortisone 2.5 % cream Apply 1 Application topically 2 (two) times daily as needed.   isosorbide mononitrate (IMDUR) 30 MG 24 hr tablet Take 30 mg by mouth at bedtime.   isosorbide mononitrate (IMDUR) 60 MG 24 hr tablet Take 60 mg by mouth daily after breakfast.   ketoconazole (NIZORAL) 2 % cream Apply 1 application topically 2 (two) times daily.   Loteprednol Etabonate 0.2 % EMUL Apply 1 drop to eye as needed. Right Eye   magnesium hydroxide (MILK OF MAGNESIA) 400 MG/5ML suspension Take 30 mLs by mouth daily as needed for mild constipation.   metoprolol succinate (TOPROL-XL) 25 MG 24 hr tablet Take 25 mg by mouth every morning.   nitroGLYCERIN (NITROSTAT) 0.4 MG SL tablet PLACE 1 TABLET (0.4 MG TOTAL) UNDER THE TONGUE EVERY 5 (FIVE) MINUTES AS NEEDED FOR  CHEST PAIN.   nystatin (MYCOSTATIN/NYSTOP) powder Apply 1 Application topically 2 (two) times daily. As needed   ondansetron (ZOFRAN) 4 MG tablet Take 4 mg by mouth as needed for nausea or vomiting. Up to 3 times a day   OXYGEN 2lpm for dyspnea or SOB   pantoprazole (PROTONIX) 40 MG tablet Take 1 tablet (40 mg total) by mouth 2 (two) times daily for 2 days.   polyethylene glycol (MIRALAX / GLYCOLAX) packet Take 17 g by mouth daily.   potassium chloride SA (KLOR-CON M) 20 MEQ tablet TAKE 1 TABLET BY MOUTH ONCE DAILY  *TAKE WITH FOOD* *DO NOT CRUSH OR CHEW* *MAY DISSOLVE*   tamsulosin (FLOMAX) 0.4 MG CAPS capsule Take 0.4 mg by mouth daily.   zinc gluconate 50 MG tablet Take 50 mg by mouth daily.   No facility-administered encounter medications on file as of 05/01/2023.    Review of Systems***  Immunization History  Administered Date(s) Administered   Influenza, High Dose Seasonal PF 12/13/2013, 12/27/2014, 11/02/2015, 11/11/2017   Influenza-Unspecified 11/11/2016, 11/28/2020, 11/28/2021   Moderna Covid-19 Fall Seasonal Vaccine 46yrs & older 05/21/2022, 12/26/2022   Moderna Covid-19 Vaccine Bivalent Booster 67yrs &  up 12/21/2021   Moderna Sars-Covid-2 Vaccination 02/22/2019, 03/22/2019, 12/24/2019   Pneumococcal Conjugate-13 03/14/2014   Pneumococcal Polysaccharide-23 02/11/2013   Tdap 09/19/2015   Unspecified SARS-COV-2 Vaccination 12/24/2019, 06/30/2020, 11/03/2020, 07/10/2021   Zoster Recombinant(Shingrix) 06/12/2022, 10/11/2022   Zoster, Live 02/11/2013   Pertinent  Health Maintenance Due  Topic Date Due   INFLUENZA VACCINE  09/12/2022      04/01/2018    9:40 AM 04/01/2018    8:00 PM 04/13/2018   12:58 PM 03/10/2019    7:17 PM 12/18/2020    2:40 AM  Fall Risk  Falls in the past year?   0 0   (RETIRED) Patient Fall Risk Level High fall risk Moderate fall risk   Low fall risk   Functional Status Survey:    Vitals:   05/01/23 1017  BP: 130/63  Pulse: 89  Resp: (!) 22  Temp: 97.8 F (36.6 C)  SpO2: 93%  Weight: 194 lb (88 kg)  Height: 5\' 4"  (1.626 m)   Body mass index is 33.3 kg/m. Physical Exam***  Labs reviewed: Recent Labs    08/03/22 1551 08/12/22 0000 10/30/22 0729 01/16/23 0000 04/10/23 0000  NA 128*   < > 134* 134* 125*  K 3.6   < > 4.2 4.1 3.7  CL 97*   < > 100 98* 93*  CO2 22   < > 26 26* 26*  GLUCOSE 135*  --  103*  --   --   BUN 16   < > 10 10 11   CREATININE 1.31*   < > 1.05 1.0 1.2  CALCIUM 7.9*   < > 8.6* 8.4* 7.6*   < > = values in this interval not displayed.   Recent Labs    08/03/22 1551 01/16/23 0000  AST 21 16  ALT 16 12  ALKPHOS 114  --   BILITOT 0.8  --   PROT 6.3*  --   ALBUMIN 3.6 3.9   Recent Labs    08/03/22 1551 10/30/22 0729 01/16/23 0000 04/10/23 0000  WBC 5.8 5.9 6.5 7.5  NEUTROABS  --   --  4,420.00 5,415.00  HGB 10.7* 11.2* 11.0* 8.9*  HCT 33.1* 35.2* 35* 27*  MCV 84.9 85.6  --   --   PLT 228 261 274 232   Lab Results  Component Value Date   TSH 5.46 01/16/2023   Lab Results  Component Value Date   HGBA1C 5.8 (H) 05/27/2013   Lab Results  Component Value Date   CHOL 106 01/16/2023   HDL 39 01/16/2023    LDLCALC 50 01/16/2023   TRIG 85 01/16/2023   CHOLHDL 3.2 10/11/2021    Significant Diagnostic Results in last 30 days:  No results found.  Assessment/Plan No problem-specific Assessment & Plan notes found for this encounter.    Janene Harvey. Biagio Borg [facility] [phone]

## 2023-05-01 NOTE — Progress Notes (Unsigned)
 Location:  Other [facility].  Nursing Home Room Number: [name] Place of Service:  ALF [phone]) Abbey Chatters, NP  PCP: [name], MD  Patient Care Team: [name], MD as PCP - General (Family Medicine) End, [name], MD as PCP - Cardiology (Cardiology) [name], MD as Referring Physician (Ophthalmology) [name], NP as Nurse Practitioner (Geriatric Medicine)  Extended Emergency Contact Information Primary Emergency Contact: [name] [place] of [place] Mobile Phone: [phone] Relation: Daughter  Goals of care: Advanced Directive information    03/19/2023    9:40 AM  Advanced Directives  Does Patient Have a Medical Advance Directive? Yes  Type of Estate agent of [place];Out of facility DNR (pink MOST or yellow form)  Does patient want to make changes to medical advance directive? No - Patient declined  Copy of Healthcare Power of Attorney in Chart? Yes - validated most recent copy scanned in chart (See row information)     No chief complaint on file.   HPI:  Pt is a 89 y.o. male seen today for an acute visit for ***   Past Medical History:  Diagnosis Date   Allergic rhinitis    Benign prostatic hypertrophy    CAD (coronary artery disease)    a. 1998 CABG x 4(LIMA-LAD, VG-D1-D2, VG-RPL); b. 2000 & 2006 PCI of the LCX/OM2; c. 03/2018 Cath: LM 25d, LAD 100ost/m, 42m/d, D2 100, fills via collats from D3, LCX 90ost/m ISR w/ evidence of stent fx, 5m, OM2 30 ISR, 80/80, RCA 70p, RPDA 60ost, 70, RPAV 100, VG->D1->D2 100p (culprit), LIMA->LAD nl, VG->RPL2 nl. EF 45%-->Med Rx; d. 03/2021 MV: ant/antsept/apical inf/inflat isch, EF 75%.   Duodenal diverticulum 02/11/2001   GERD (gastroesophageal reflux disease)    H/O hiatal hernia 02/11/2001   Heart murmur    a. 03/2018 Echo: EF 55-60%, DD, mild anterolateral HK.  Mild-mod TR. Mild Ca2+ of AoV and MV.   Hepatic lesion 02/11/2002   History of  melanoma excision    FACE   Hypercholesteremia    Hypertension    Left carotid stenosis    > 50%  PER DUPLEX  03-28-2011   Mild mitral regurgitation    Perianal fistula    S/P CABG x 4    1988   S/P drug eluting coronary stent placement    POST CABG--  STENTING 2000  AND RESTENTING 2006 FOR IN-STENT STENOSIS   Stroke [facility])    Past Surgical History:  Procedure Laterality Date   CATARACT EXTRACTION W/ INTRAOCULAR LENS  IMPLANT, BILATERAL     CORONARY ANGIOPLASTY WITH STENT PLACEMENT  2000   PCI AND STENTING CIRCUMFLEX   CORONARY ANGIOPLASTY WITH STENT PLACEMENT  04-30-2004  DR Verdis Prime   RE-INSTENT STENOSIS/  DRUG-ELUTING STENT OF PROXIMAL AND MID CIRCUMFLEX/ PCI MID CIRCUMFLEX THROUGH STENT STRUT/  WIDELY PATENT SAPHENOUS VEIN GRAFT AND  LIMA TO LAD GRAFT/ TOTAL OCCLUSION RIGHT CORONARY BEYOND THE POSTERIOR DESCENDING ARTERY BRANCH WITH 50% OSTIAL NARROWING/ TOTAL OCCLUSION OF LAD AT THE OSTIUM OF THE LEFT MAIN   CORONARY ARTERY BYPASS GRAFT  1988   X5   EVALUATION UNDER ANESTHESIA WITH ANAL FISTULECTOMY N/A 07/22/2012   Procedure: EXAM UNDER ANESTHESIA WITH ANAL fistulotomy;  Surgeon: Romie Levee, MD;  Location: [facility];  Service: General;  Laterality: N/A;   LEFT HEART CATH AND CORS/GRAFTS ANGIOGRAPHY N/A 03/31/2018   Procedure: LEFT HEART CATH AND CORS/GRAFTS ANGIOGRAPHY;  Surgeon: Yvonne Kendall, MD;  Location: [facility] INVASIVE CV LAB;  Service: Cardiovascular;  Laterality: N/A;   SHOULDER ARTHROSCOPY WITH OPEN ROTATOR CUFF REPAIR Right 07-10-1999   TONSILLECTOMY AND ADENOIDECTOMY  1944   TRANSTHORACIC ECHOCARDIOGRAM  03-01-2011  DR Verdis Prime   NORMAL LVF AND LV SIZE/ MILD LEFT ATRIAL ENLARGEMENT/ GRADE II DIASTOLIC DYSFUNCTION WITH ELEVATED LEFT ATRIAL PRESSURE/ MILD TO MODERATE MR/ MILD TR   TRANSURETHRAL RESECTION OF PROSTATE  04/09/2011   Procedure: TRANSURETHRAL RESECTION OF THE PROSTATE WITH GYRUS INSTRUMENTS;  Surgeon: Anner Crete, MD;  Location: [facility];  Service: Urology;  Laterality: N/A;    Allergies  Allergen Reactions   Tape Rash    ONLY USE PAPER TAPE ONLY USE PAPER TAPE   Iodine Rash    Rash when applied to skin   Niacin And Related Hives and Other (See Comments)    FLUSHING itching    Outpatient Encounter Medications as of 05/01/2023  Medication Sig   acetaminophen (TYLENOL) 325 MG tablet Take 650 mg by mouth every 4 (four) hours as needed.   acetaminophen (TYLENOL) 650 MG CR tablet Take 650 mg by mouth at bedtime.   alum & mag hydroxide-simeth (MAALOX/MYLANTA) 200-200-20 MG/5ML suspension Take 30 mLs by mouth every 4 (four) hours as needed for indigestion or heartburn.   amLODipine (NORVASC) 2.5 MG tablet TAKE 1 TABLET BY MOUTH ONCE DAILY   atorvastatin (LIPITOR) 80 MG tablet Take one tablet nightly for high cholesterol   carbamide peroxide (DEBROX) 6.5 % OTIC solution Place 5 drops into both ears as needed.   cetirizine (ZYRTEC) 10 MG chewable tablet Chew 10 mg by mouth as needed for allergies.   clobetasol cream (TEMOVATE) 0.05 % Apply topically 2 (two) times daily.   clopidogrel (PLAVIX) 75 MG tablet Take 75 mg by mouth daily.   cyanocobalamin (,VITAMIN B-12,) 1000 MCG/ML injection Inject 1,000 mcg into the muscle every 30 (thirty) days.   dextromethorphan-guaiFENesin (MUCINEX DM) 30-600 MG 12hr tablet Take 1 tablet by mouth 2 (two) times daily as needed for cough.   dextromethorphan-guaiFENesin (ROBITUSSIN-DM) 10-100 MG/5ML liquid Take 10 mLs by mouth every 4 (four) hours as needed for cough.   Emollient (EUCERIN EX) Apply to BLE and trunk topically two times daily   furosemide (LASIX) 20 MG tablet Take 20 mg by mouth as needed. For the overnight weight gain of 3 lbs or for 5lb gain in one week. Take one tablet by mouth in the morning  for 3 days.   furosemide (LASIX) 80 MG tablet Take 80 mg by mouth daily.   Glucose 15 GM/32ML GEL Take 1 packet by mouth as needed.   hydrocortisone 2.5 % cream Apply 1 Application  topically 2 (two) times daily as needed.   isosorbide mononitrate (IMDUR) 30 MG 24 hr tablet Take 30 mg by mouth at bedtime.   isosorbide mononitrate (IMDUR) 60 MG 24 hr tablet Take 60 mg by mouth daily after breakfast.   ketoconazole (NIZORAL) 2 % cream Apply 1 application topically 2 (two) times daily.   Loteprednol Etabonate 0.2 % EMUL Apply 1 drop to eye as needed. Right Eye   magnesium hydroxide (MILK OF MAGNESIA) 400 MG/5ML suspension Take 30 mLs by mouth daily as needed for mild constipation.   metoprolol succinate (TOPROL-XL) 25 MG 24 hr tablet Take 25 mg by mouth every morning.   nitroGLYCERIN (NITROSTAT) 0.4 MG SL tablet PLACE 1 TABLET (0.4 MG TOTAL) UNDER THE TONGUE EVERY 5 (FIVE) MINUTES AS NEEDED FOR CHEST PAIN.   nystatin (MYCOSTATIN/NYSTOP) powder Apply 1 Application topically 2 (  two) times daily. As needed   ondansetron (ZOFRAN) 4 MG tablet Take 4 mg by mouth as needed for nausea or vomiting. Up to 3 times a day   OXYGEN 2lpm for dyspnea or SOB   pantoprazole (PROTONIX) 40 MG tablet Take 1 tablet (40 mg total) by mouth 2 (two) times daily for 2 days.   polyethylene glycol (MIRALAX / GLYCOLAX) packet Take 17 g by mouth daily.   potassium chloride SA (KLOR-CON M) 20 MEQ tablet TAKE 1 TABLET BY MOUTH ONCE DAILY  *TAKE WITH FOOD* *DO NOT CRUSH OR CHEW* *Desi Carby DISSOLVE*   tamsulosin (FLOMAX) 0.4 MG CAPS capsule Take 0.4 mg by mouth daily.   Ascorbic Acid (VITAMIN C) 1000 MG tablet Take 1,000 mg by mouth daily. (Patient not taking: Reported on 05/01/2023)   doxycycline (VIBRA-TABS) 100 MG tablet Take 1 tablet (100 mg total) by mouth 2 (two) times daily. (Patient not taking: Reported on 05/01/2023)   zinc gluconate 50 MG tablet Take 50 mg by mouth daily. (Patient not taking: Reported on 05/01/2023)   No facility-administered encounter medications on file as of 05/01/2023.    Review of Systems***  Immunization History  Administered Date(s) Administered   Influenza, High Dose Seasonal PF  12/13/2013, 12/27/2014, 11/02/2015, 11/11/2017   Influenza-Unspecified 11/11/2016, 11/28/2020, 11/28/2021   Moderna Covid-19 Fall Seasonal Vaccine 22yrs & older 05/21/2022, 12/26/2022   Moderna Covid-19 Vaccine Bivalent Booster 85yrs & up 12/21/2021   Moderna Sars-Covid-2 Vaccination 02/22/2019, 03/22/2019, 12/24/2019   Pneumococcal Conjugate-13 03/14/2014   Pneumococcal Polysaccharide-23 02/11/2013   Tdap 09/19/2015   Unspecified SARS-COV-2 Vaccination 12/24/2019, 06/30/2020, 11/03/2020, 07/10/2021   Zoster Recombinant(Shingrix) 06/12/2022, 10/11/2022   Zoster, Live 02/11/2013   Pertinent  Health Maintenance Due  Topic Date Due   INFLUENZA VACCINE  09/12/2022      04/01/2018    9:40 AM 04/01/2018    8:00 PM 04/13/2018   12:58 PM 03/10/2019    7:17 PM 12/18/2020    2:40 AM  Fall Risk  Falls in the past year?   0 0   (RETIRED) Patient Fall Risk Level High fall risk Moderate fall risk   Low fall risk   Functional Status Survey:    Vitals:   05/01/23 1017  BP: 130/63  Pulse: 89  Resp: (!) 22  Temp: 97.8 F (36.6 C)  SpO2: 93%  Weight: 194 lb (88 kg)  Height: 5\' 4"  (1.626 m)   Body mass index is 33.3 kg/m. Physical Exam***  Labs reviewed: Recent Labs    08/03/22 1551 08/12/22 0000 10/30/22 0729 01/16/23 0000 04/10/23 0000 04/14/23 0000  NA 128*   < > 134* 134* 125* 126*  K 3.6   < > 4.2 4.1 3.7 3.7  CL 97*   < > 100 98* 93* 90*  CO2 22   < > 26 26* 26* 28*  GLUCOSE 135*  --  103*  --   --   --   BUN 16   < > 10 10 11 12   CREATININE 1.31*   < > 1.05 1.0 1.2 1.3  CALCIUM 7.9*   < > 8.6* 8.4* 7.6* 8.1*   < > = values in this interval not displayed.   Recent Labs    08/03/22 1551 01/16/23 0000  AST 21 16  ALT 16 12  ALKPHOS 114  --   BILITOT 0.8  --   PROT 6.3*  --   ALBUMIN 3.6 3.9   Recent Labs    08/03/22 1551 10/30/22 0729 01/16/23  0000 04/10/23 0000  WBC 5.8 5.9 6.5 7.5  NEUTROABS  --   --  4,420.00 5,415.00  HGB 10.7* 11.2* 11.0* 8.9*  HCT  33.1* 35.2* 35* 27*  MCV 84.9 85.6  --   --   PLT 228 261 274 232   Lab Results  Component Value Date   TSH 5.46 01/16/2023   Lab Results  Component Value Date   HGBA1C 5.8 (H) 05/27/2013   Lab Results  Component Value Date   CHOL 106 01/16/2023   HDL 39 01/16/2023   LDLCALC 50 01/16/2023   TRIG 85 01/16/2023   CHOLHDL 3.2 10/11/2021    Significant Diagnostic Results in last 30 days:  No results found.  Assessment/Plan No problem-specific Assessment & Plan notes found for this encounter.    Janene Harvey. Biagio Borg [facility] [address] [facility] [phone]

## 2023-05-05 DIAGNOSIS — B351 Tinea unguium: Secondary | ICD-10-CM | POA: Diagnosis not present

## 2023-05-05 DIAGNOSIS — I7091 Generalized atherosclerosis: Secondary | ICD-10-CM | POA: Diagnosis not present

## 2023-05-06 DIAGNOSIS — Z741 Need for assistance with personal care: Secondary | ICD-10-CM | POA: Diagnosis not present

## 2023-05-06 DIAGNOSIS — Z9181 History of falling: Secondary | ICD-10-CM | POA: Diagnosis not present

## 2023-05-06 DIAGNOSIS — R278 Other lack of coordination: Secondary | ICD-10-CM | POA: Diagnosis not present

## 2023-05-06 DIAGNOSIS — R2689 Other abnormalities of gait and mobility: Secondary | ICD-10-CM | POA: Diagnosis not present

## 2023-05-06 DIAGNOSIS — M6281 Muscle weakness (generalized): Secondary | ICD-10-CM | POA: Diagnosis not present

## 2023-05-06 DIAGNOSIS — G3184 Mild cognitive impairment, so stated: Secondary | ICD-10-CM | POA: Diagnosis not present

## 2023-05-06 DIAGNOSIS — R2681 Unsteadiness on feet: Secondary | ICD-10-CM | POA: Diagnosis not present

## 2023-05-06 DIAGNOSIS — M545 Low back pain, unspecified: Secondary | ICD-10-CM | POA: Diagnosis not present

## 2023-05-06 DIAGNOSIS — R4189 Other symptoms and signs involving cognitive functions and awareness: Secondary | ICD-10-CM | POA: Diagnosis not present

## 2023-05-08 ENCOUNTER — Encounter: Payer: Self-pay | Admitting: Nurse Practitioner

## 2023-05-08 ENCOUNTER — Non-Acute Institutional Stay: Payer: Self-pay | Admitting: Nurse Practitioner

## 2023-05-08 DIAGNOSIS — K219 Gastro-esophageal reflux disease without esophagitis: Secondary | ICD-10-CM | POA: Diagnosis not present

## 2023-05-08 DIAGNOSIS — R351 Nocturia: Secondary | ICD-10-CM | POA: Diagnosis not present

## 2023-05-08 DIAGNOSIS — M545 Low back pain, unspecified: Secondary | ICD-10-CM

## 2023-05-08 DIAGNOSIS — I10 Essential (primary) hypertension: Secondary | ICD-10-CM | POA: Diagnosis not present

## 2023-05-08 DIAGNOSIS — M7989 Other specified soft tissue disorders: Secondary | ICD-10-CM | POA: Diagnosis not present

## 2023-05-08 DIAGNOSIS — F03A18 Unspecified dementia, mild, with other behavioral disturbance: Secondary | ICD-10-CM

## 2023-05-08 DIAGNOSIS — N401 Enlarged prostate with lower urinary tract symptoms: Secondary | ICD-10-CM

## 2023-05-08 DIAGNOSIS — R2681 Unsteadiness on feet: Secondary | ICD-10-CM | POA: Diagnosis not present

## 2023-05-08 DIAGNOSIS — I5032 Chronic diastolic (congestive) heart failure: Secondary | ICD-10-CM | POA: Diagnosis not present

## 2023-05-08 DIAGNOSIS — D649 Anemia, unspecified: Secondary | ICD-10-CM

## 2023-05-08 DIAGNOSIS — R278 Other lack of coordination: Secondary | ICD-10-CM | POA: Diagnosis not present

## 2023-05-08 DIAGNOSIS — E871 Hypo-osmolality and hyponatremia: Secondary | ICD-10-CM | POA: Diagnosis not present

## 2023-05-08 DIAGNOSIS — Z9181 History of falling: Secondary | ICD-10-CM | POA: Diagnosis not present

## 2023-05-08 DIAGNOSIS — I25118 Atherosclerotic heart disease of native coronary artery with other forms of angina pectoris: Secondary | ICD-10-CM

## 2023-05-08 DIAGNOSIS — N1831 Chronic kidney disease, stage 3a: Secondary | ICD-10-CM | POA: Diagnosis not present

## 2023-05-08 DIAGNOSIS — N390 Urinary tract infection, site not specified: Secondary | ICD-10-CM | POA: Diagnosis not present

## 2023-05-08 DIAGNOSIS — G3184 Mild cognitive impairment, so stated: Secondary | ICD-10-CM | POA: Diagnosis not present

## 2023-05-08 DIAGNOSIS — R2689 Other abnormalities of gait and mobility: Secondary | ICD-10-CM | POA: Diagnosis not present

## 2023-05-08 NOTE — Progress Notes (Signed)
 Location:  Other [facility].  Nursing Home Room Number: [name] Place of Service:  ALF [phone]) Abbey Chatters, NP  PCP: [name], MD  Patient Care Team: [name], MD as PCP - General (Family Medicine) End, [name], MD as PCP - Cardiology (Cardiology) [name], MD as Referring Physician (Ophthalmology) [name], NP as Nurse Practitioner (Geriatric Medicine)  Extended Emergency Contact Information Primary Emergency Contact: [name] [place] of [place] Mobile Phone: [phone] Relation: Daughter  Goals of care: Advanced Directive information    03/19/2023    9:40 AM  Advanced Directives  Does Patient Have a Medical Advance Directive? Yes  Type of Estate agent of [place];Out of facility DNR (pink MOST or yellow form)  Does patient want to make changes to medical advance directive? No - Patient declined  Copy of Healthcare Power of Attorney in Chart? Yes - validated most recent copy scanned in chart (See row information)     Chief Complaint  Patient presents with   Medical Management of Chronic Issues    Medical Management of Chronic Issues   Discussed the use of AI scribe software for clinical note transcription with the patient, who gave verbal consent to proceed.   HPI: Pt is a 89 y.o. male seen today for medical management of chronic disease.   No chest pain, shortness of breath, or leg swelling. He continues to use compression hose for leg swelling, which is well controlled   Hx of CAD, CHF, Cardiovascular symptoms are stable on current medications, including Lasix 80 mg daily and Imdur with metoprolol.   He experiences urinary urgency and leakage but denies increased frequency or pain with urination. He is on Flomax for BPH and gets up once or twice at night to urinate. He feels he is emptying his bladder adequately and wears a pad to manage the leakage.  His back pain has  improved since the last visit. He was experiencing back pain last week, but it has since gotten better. He continues to take Tylenol, and finds it effective, also  is undergoing physical therapy.  He has been experiencing hearing difficulties and uses hearing aids, but forgot to wear them today. He has been seeing an ENT regularly for his hearing issues.  He experiences indigestion and acid reflux, which he manages with Protonix, finding it helpful. He denies abdominal pain.  His memory is 'pretty good' and he is oriented to time and place. He has assistance with his medications and finances.  Past Medical History:  Diagnosis Date   Allergic rhinitis    Benign prostatic hypertrophy    CAD (coronary artery disease)    a. 1998 CABG x 4(LIMA-LAD, VG-D1-D2, VG-RPL); b. 2000 & 2006 PCI of the LCX/OM2; c. 03/2018 Cath: LM 25d, LAD 100ost/m, 71m/d, D2 100, fills via collats from D3, LCX 90ost/m ISR w/ evidence of stent fx, 78m, OM2 30 ISR, 80/80, RCA 70p, RPDA 60ost, 70, RPAV 100, VG->D1->D2 100p (culprit), LIMA->LAD nl, VG->RPL2 nl. EF 45%-->Med Rx; d. 03/2021 MV: ant/antsept/apical inf/inflat isch, EF 75%.   Duodenal diverticulum 02/11/2001   GERD (gastroesophageal reflux disease)    H/O hiatal hernia 02/11/2001   Heart murmur    a. 03/2018 Echo: EF 55-60%, DD, mild anterolateral HK.  Mild-mod TR. Mild Ca2+ of AoV and MV.   Hepatic lesion 02/11/2002   History of melanoma excision    FACE   Hypercholesteremia    Hypertension    Left carotid stenosis    >  50%  PER DUPLEX  03-28-2011   Mild mitral regurgitation    Perianal fistula    S/P CABG x 4    1988   S/P drug eluting coronary stent placement    POST CABG--  STENTING 2000  AND RESTENTING 2006 FOR IN-STENT STENOSIS   Stroke [facility])    Past Surgical History:  Procedure Laterality Date   CATARACT EXTRACTION W/ INTRAOCULAR LENS  IMPLANT, BILATERAL     CORONARY ANGIOPLASTY WITH STENT PLACEMENT  2000   PCI AND STENTING CIRCUMFLEX   CORONARY  ANGIOPLASTY WITH STENT PLACEMENT  04-30-2004  DR Verdis Prime   RE-INSTENT STENOSIS/  DRUG-ELUTING STENT OF PROXIMAL AND MID CIRCUMFLEX/ PCI MID CIRCUMFLEX THROUGH STENT STRUT/  WIDELY PATENT SAPHENOUS VEIN GRAFT AND  LIMA TO LAD GRAFT/ TOTAL OCCLUSION RIGHT CORONARY BEYOND THE POSTERIOR DESCENDING ARTERY BRANCH WITH 50% OSTIAL NARROWING/ TOTAL OCCLUSION OF LAD AT THE OSTIUM OF THE LEFT MAIN   CORONARY ARTERY BYPASS GRAFT  1988   X5   EVALUATION UNDER ANESTHESIA WITH ANAL FISTULECTOMY N/A 07/22/2012   Procedure: EXAM UNDER ANESTHESIA WITH ANAL fistulotomy;  Surgeon: Romie Levee, MD;  Location: [facility];  Service: General;  Laterality: N/A;   LEFT HEART CATH AND CORS/GRAFTS ANGIOGRAPHY N/A 03/31/2018   Procedure: LEFT HEART CATH AND CORS/GRAFTS ANGIOGRAPHY;  Surgeon: Yvonne Kendall, MD;  Location: [facility] INVASIVE CV LAB;  Service: Cardiovascular;  Laterality: N/A;   SHOULDER ARTHROSCOPY WITH OPEN ROTATOR CUFF REPAIR Right 07-10-1999   TONSILLECTOMY AND ADENOIDECTOMY  1944   TRANSTHORACIC ECHOCARDIOGRAM  03-01-2011  DR Verdis Prime   NORMAL LVF AND LV SIZE/ MILD LEFT ATRIAL ENLARGEMENT/ GRADE II DIASTOLIC DYSFUNCTION WITH ELEVATED LEFT ATRIAL PRESSURE/ MILD TO MODERATE MR/ MILD TR   TRANSURETHRAL RESECTION OF PROSTATE  04/09/2011   Procedure: TRANSURETHRAL RESECTION OF THE PROSTATE WITH GYRUS INSTRUMENTS;  Surgeon: Anner Crete, MD;  Location: [facility];  Service: Urology;  Laterality: N/A;    Allergies  Allergen Reactions   Tape Rash    ONLY USE PAPER TAPE ONLY USE PAPER TAPE   Iodine Rash    Rash when applied to skin   Niacin And Related Hives and Other (See Comments)    FLUSHING itching    Outpatient Encounter Medications as of 05/08/2023  Medication Sig   acetaminophen (TYLENOL) 325 MG tablet Take 650 mg by mouth every 4 (four) hours as needed.   acetaminophen (TYLENOL) 500 MG tablet Take 1,000 mg by mouth 2 (two) times daily.   alum & mag hydroxide-simeth  (MAALOX/MYLANTA) 200-200-20 MG/5ML suspension Take 30 mLs by mouth every 4 (four) hours as needed for indigestion or heartburn.   amLODipine (NORVASC) 2.5 MG tablet TAKE 1 TABLET BY MOUTH ONCE DAILY   atorvastatin (LIPITOR) 80 MG tablet Take one tablet nightly for high cholesterol   carbamide peroxide (DEBROX) 6.5 % OTIC solution Place 5 drops into both ears as needed.   cetirizine (ZYRTEC) 10 MG chewable tablet Chew 10 mg by mouth as needed for allergies.   clobetasol cream (TEMOVATE) 0.05 % Apply topically 2 (two) times daily.   clopidogrel (PLAVIX) 75 MG tablet Take 75 mg by mouth daily.   cyanocobalamin (,VITAMIN B-12,) 1000 MCG/ML injection Inject 1,000 mcg into the muscle every 30 (thirty) days.   dextromethorphan-guaiFENesin (MUCINEX DM) 30-600 MG 12hr tablet Take 1 tablet by mouth 2 (two) times daily as needed for cough.   dextromethorphan-guaiFENesin (ROBITUSSIN-DM) 10-100 MG/5ML liquid Take 10 mLs by mouth every 4 (four) hours as needed for  cough.   Emollient (EUCERIN EX) Apply to BLE and trunk topically two times daily   furosemide (LASIX) 20 MG tablet Take 20 mg by mouth as needed. For the overnight weight gain of 3 lbs or for 5lb gain in one week. Take one tablet by mouth in the morning  for 3 days.   furosemide (LASIX) 80 MG tablet Take 80 mg by mouth daily.   Glucose 15 GM/32ML GEL Take 1 packet by mouth as needed.   hydrocortisone 2.5 % cream Apply 1 Application topically 2 (two) times daily as needed.   isosorbide mononitrate (IMDUR) 30 MG 24 hr tablet Take 30 mg by mouth at bedtime.   isosorbide mononitrate (IMDUR) 60 MG 24 hr tablet Take 60 mg by mouth daily after breakfast.   ketoconazole (NIZORAL) 2 % cream Apply 1 application topically 2 (two) times daily.   Loteprednol Etabonate 0.2 % EMUL Apply 1 drop to eye as needed. Right Eye   magnesium hydroxide (MILK OF MAGNESIA) 400 MG/5ML suspension Take 30 mLs by mouth daily as needed for mild constipation.   metoprolol succinate  (TOPROL-XL) 25 MG 24 hr tablet Take 25 mg by mouth every morning.   nitroGLYCERIN (NITROSTAT) 0.4 MG SL tablet PLACE 1 TABLET (0.4 MG TOTAL) UNDER THE TONGUE EVERY 5 (FIVE) MINUTES AS NEEDED FOR CHEST PAIN.   nystatin (MYCOSTATIN/NYSTOP) powder Apply 1 Application topically 2 (two) times daily. As needed   ondansetron (ZOFRAN) 4 MG tablet Take 4 mg by mouth as needed for nausea or vomiting. Up to 3 times a day   OXYGEN 2lpm for dyspnea or SOB   pantoprazole (PROTONIX) 40 MG tablet Take 1 tablet (40 mg total) by mouth 2 (two) times daily for 2 days.   polyethylene glycol (MIRALAX / GLYCOLAX) packet Take 17 g by mouth daily.   potassium chloride SA (KLOR-CON M) 20 MEQ tablet TAKE 1 TABLET BY MOUTH ONCE DAILY  *TAKE WITH FOOD* *DO NOT CRUSH OR CHEW* *MAY DISSOLVE*   tamsulosin (FLOMAX) 0.4 MG CAPS capsule Take 0.4 mg by mouth daily.   acetaminophen (TYLENOL) 650 MG CR tablet Take 650 mg by mouth at bedtime. (Patient not taking: Reported on 05/08/2023)   Ascorbic Acid (VITAMIN C) 1000 MG tablet Take 1,000 mg by mouth daily. (Patient not taking: Reported on 05/08/2023)   doxycycline (VIBRA-TABS) 100 MG tablet Take 1 tablet (100 mg total) by mouth 2 (two) times daily. (Patient not taking: Reported on 05/08/2023)   zinc gluconate 50 MG tablet Take 50 mg by mouth daily. (Patient not taking: Reported on 05/08/2023)   No facility-administered encounter medications on file as of 05/08/2023.    Review of Systems  Constitutional:  Negative for activity change, appetite change, fatigue and unexpected weight change.  HENT:  Positive for hearing loss. Negative for congestion.   Eyes: Negative.   Respiratory:  Negative for cough and shortness of breath.   Cardiovascular:  Negative for chest pain, palpitations and leg swelling.  Gastrointestinal:  Negative for abdominal pain, constipation and diarrhea.  Genitourinary:  Positive for urgency. Negative for difficulty urinating and dysuria.  Musculoskeletal:  Positive  for arthralgias and back pain. Negative for myalgias.  Skin:  Negative for color change and wound.  Neurological:  Negative for dizziness and weakness.  Psychiatric/Behavioral:  Positive for confusion. Negative for agitation and behavioral problems.      Immunization History  Administered Date(s) Administered   Influenza, High Dose Seasonal PF 12/13/2013, 12/27/2014, 11/02/2015, 11/11/2017   Influenza-Unspecified 11/11/2016, 11/28/2020, 11/28/2021  Moderna Covid-19 Fall Seasonal Vaccine 49yrs & older 05/21/2022, 12/26/2022   Moderna Covid-19 Vaccine Bivalent Booster 68yrs & up 12/21/2021   Moderna Sars-Covid-2 Vaccination 02/22/2019, 03/22/2019, 12/24/2019   Pneumococcal Conjugate-13 03/14/2014   Pneumococcal Polysaccharide-23 02/11/2013   Tdap 09/19/2015   Unspecified SARS-COV-2 Vaccination 12/24/2019, 06/30/2020, 11/03/2020, 07/10/2021   Zoster Recombinant(Shingrix) 06/12/2022, 10/11/2022   Zoster, Live 02/11/2013   Pertinent  Health Maintenance Due  Topic Date Due   INFLUENZA VACCINE  05/12/2023 (Originally 09/12/2022)      04/01/2018    9:40 AM 04/01/2018    8:00 PM 04/13/2018   12:58 PM 03/10/2019    7:17 PM 12/18/2020    2:40 AM  Fall Risk  Falls in the past year?   0 0   (RETIRED) Patient Fall Risk Level High fall risk Moderate fall risk   Low fall risk   Functional Status Survey:    Vitals:   05/08/23 0836  BP: 130/63  Pulse: 89  Resp: (!) 22  Temp: 97.8 F (36.6 C)  SpO2: 93%  Weight: 190 lb 8 oz (86.4 kg)  Height: 5\' 4"  (1.626 m)   Body mass index is 32.7 kg/m. Physical Exam Constitutional:      General: He is not in acute distress.    Appearance: He is well-developed. He is not diaphoretic.  HENT:     Head: Normocephalic and atraumatic.     Right Ear: External ear normal.     Left Ear: External ear normal.     Mouth/Throat:     Pharynx: No oropharyngeal exudate.  Eyes:     Conjunctiva/sclera: Conjunctivae normal.     Pupils: Pupils are equal, round,  and reactive to light.  Cardiovascular:     Rate and Rhythm: Normal rate and regular rhythm.     Heart sounds: Normal heart sounds.  Pulmonary:     Effort: Pulmonary effort is normal.     Breath sounds: Normal breath sounds.  Abdominal:     General: Bowel sounds are normal.     Palpations: Abdomen is soft.  Musculoskeletal:        General: No tenderness.     Cervical back: Normal range of motion and neck supple.     Right lower leg: Edema (trace) present.     Left lower leg: Edema (trace) present.  Skin:    General: Skin is warm and dry.  Neurological:     Mental Status: He is alert and oriented to person, place, and time.     Labs reviewed: Recent Labs    08/03/22 1551 08/12/22 0000 10/30/22 0729 01/16/23 0000 04/10/23 0000 04/14/23 0000  NA 128*   < > 134* 134* 125* 126*  K 3.6   < > 4.2 4.1 3.7 3.7  CL 97*   < > 100 98* 93* 90*  CO2 22   < > 26 26* 26* 28*  GLUCOSE 135*  --  103*  --   --   --   BUN 16   < > 10 10 11 12   CREATININE 1.31*   < > 1.05 1.0 1.2 1.3  CALCIUM 7.9*   < > 8.6* 8.4* 7.6* 8.1*   < > = values in this interval not displayed.   Recent Labs    08/03/22 1551 01/16/23 0000  AST 21 16  ALT 16 12  ALKPHOS 114  --   BILITOT 0.8  --   PROT 6.3*  --   ALBUMIN 3.6 3.9   Recent Labs  08/03/22 1551 10/30/22 0729 01/16/23 0000 04/10/23 0000  WBC 5.8 5.9 6.5 7.5  NEUTROABS  --   --  4,420.00 5,415.00  HGB 10.7* 11.2* 11.0* 8.9*  HCT 33.1* 35.2* 35* 27*  MCV 84.9 85.6  --   --   PLT 228 261 274 232   Lab Results  Component Value Date   TSH 5.46 01/16/2023   Lab Results  Component Value Date   HGBA1C 5.8 (H) 05/27/2013   Lab Results  Component Value Date   CHOL 106 01/16/2023   HDL 39 01/16/2023   LDLCALC 50 01/16/2023   TRIG 85 01/16/2023   CHOLHDL 3.2 10/11/2021    Significant Diagnostic Results in last 30 days:  No results found.  Assessment/Plan Chronic Heart Failure with Preserved Ejection Fraction Stable on current  regimen. No new symptoms at this time Followed by cardiology  - Continue Lasix 80 mg daily. - Administer additional 20 mg of Lasix for weight gain. - Follow up BNP stable on last labs, follow up bmp - Continue use of compression hose.  Coronary Artery Disease Asymptomatic with no chest pain or dyspnea. - Continue Imdur.  Chronic Kidney Disease Stage 3 Stable. Avoid nephrotoxic medications. - Avoid nephrotoxic medications. - Dose adjust medications as needed.  Hypertension Well-controlled on current medication regimen. - Continue Toprol 25 mg daily. - Continue Lasix. - Continue Amlodipine 2.5 mg daily.  Hyponatremia Likely secondary to diuretic use. Stable on last labs but slightly worse from December. - Follow up BMP with next lab draw.  Anemia Noted on labs. No signs of blood loss. - Follow up CBC.  Benign Prostatic Hyperplasia with Nocturia Ongoing. Reports urinary leakage and incontinence, likely related to BPH. - Continue Flomax.  Right Sided Low Back Pain Symptoms improved significantly. Reports no current pain. - Continue Tylenol twice daily. - Continue physical therapy.  Dementia Slow progressive memory loss. Supportive care provided. - Continue supportive care through staff.  Gastroesophageal Reflux Disease (GERD) Controlled on current medication regimen. - Continue Protonix twice daily.    Janene Harvey. [facility] [phone]

## 2023-05-12 DIAGNOSIS — D500 Iron deficiency anemia secondary to blood loss (chronic): Secondary | ICD-10-CM | POA: Diagnosis not present

## 2023-05-12 DIAGNOSIS — E871 Hypo-osmolality and hyponatremia: Secondary | ICD-10-CM | POA: Diagnosis not present

## 2023-05-12 LAB — CBC AND DIFFERENTIAL
HCT: 33 — AB (ref 41–53)
Hemoglobin: 10.5 — AB (ref 13.5–17.5)
Neutrophils Absolute: 2998
Platelets: 248 10*3/uL (ref 150–400)
WBC: 5.7

## 2023-05-12 LAB — BASIC METABOLIC PANEL WITH GFR
BUN: 10 (ref 4–21)
CO2: 27 — AB (ref 13–22)
Chloride: 98 — AB (ref 99–108)
Creatinine: 0.9 (ref 0.6–1.3)
Glucose: 92
Potassium: 4.1 meq/L (ref 3.5–5.1)
Sodium: 132 — AB (ref 137–147)

## 2023-05-12 LAB — COMPREHENSIVE METABOLIC PANEL WITH GFR
Calcium: 8.4 — AB (ref 8.7–10.7)
eGFR: 81

## 2023-05-12 LAB — CBC: RBC: 3.98 (ref 3.87–5.11)

## 2023-05-13 DIAGNOSIS — R278 Other lack of coordination: Secondary | ICD-10-CM | POA: Diagnosis not present

## 2023-05-13 DIAGNOSIS — Z741 Need for assistance with personal care: Secondary | ICD-10-CM | POA: Diagnosis not present

## 2023-05-13 DIAGNOSIS — M6281 Muscle weakness (generalized): Secondary | ICD-10-CM | POA: Diagnosis not present

## 2023-05-13 DIAGNOSIS — R4189 Other symptoms and signs involving cognitive functions and awareness: Secondary | ICD-10-CM | POA: Diagnosis not present

## 2023-05-13 DIAGNOSIS — G3184 Mild cognitive impairment, so stated: Secondary | ICD-10-CM | POA: Diagnosis not present

## 2023-05-13 DIAGNOSIS — R2681 Unsteadiness on feet: Secondary | ICD-10-CM | POA: Diagnosis not present

## 2023-05-13 DIAGNOSIS — Z9181 History of falling: Secondary | ICD-10-CM | POA: Diagnosis not present

## 2023-05-13 DIAGNOSIS — R2689 Other abnormalities of gait and mobility: Secondary | ICD-10-CM | POA: Diagnosis not present

## 2023-05-13 DIAGNOSIS — M545 Low back pain, unspecified: Secondary | ICD-10-CM | POA: Diagnosis not present

## 2023-05-14 DIAGNOSIS — R2681 Unsteadiness on feet: Secondary | ICD-10-CM | POA: Diagnosis not present

## 2023-05-14 DIAGNOSIS — M545 Low back pain, unspecified: Secondary | ICD-10-CM | POA: Diagnosis not present

## 2023-05-14 DIAGNOSIS — Z9181 History of falling: Secondary | ICD-10-CM | POA: Diagnosis not present

## 2023-05-14 DIAGNOSIS — R2689 Other abnormalities of gait and mobility: Secondary | ICD-10-CM | POA: Diagnosis not present

## 2023-05-14 DIAGNOSIS — R278 Other lack of coordination: Secondary | ICD-10-CM | POA: Diagnosis not present

## 2023-05-14 DIAGNOSIS — G3184 Mild cognitive impairment, so stated: Secondary | ICD-10-CM | POA: Diagnosis not present

## 2023-05-15 DIAGNOSIS — R2689 Other abnormalities of gait and mobility: Secondary | ICD-10-CM | POA: Diagnosis not present

## 2023-05-15 DIAGNOSIS — R2681 Unsteadiness on feet: Secondary | ICD-10-CM | POA: Diagnosis not present

## 2023-05-15 DIAGNOSIS — M545 Low back pain, unspecified: Secondary | ICD-10-CM | POA: Diagnosis not present

## 2023-05-15 DIAGNOSIS — G3184 Mild cognitive impairment, so stated: Secondary | ICD-10-CM | POA: Diagnosis not present

## 2023-05-15 DIAGNOSIS — Z9181 History of falling: Secondary | ICD-10-CM | POA: Diagnosis not present

## 2023-05-15 DIAGNOSIS — R278 Other lack of coordination: Secondary | ICD-10-CM | POA: Diagnosis not present

## 2023-05-19 DIAGNOSIS — Z9181 History of falling: Secondary | ICD-10-CM | POA: Diagnosis not present

## 2023-05-19 DIAGNOSIS — R2689 Other abnormalities of gait and mobility: Secondary | ICD-10-CM | POA: Diagnosis not present

## 2023-05-19 DIAGNOSIS — R278 Other lack of coordination: Secondary | ICD-10-CM | POA: Diagnosis not present

## 2023-05-19 DIAGNOSIS — R2681 Unsteadiness on feet: Secondary | ICD-10-CM | POA: Diagnosis not present

## 2023-05-19 DIAGNOSIS — M545 Low back pain, unspecified: Secondary | ICD-10-CM | POA: Diagnosis not present

## 2023-05-19 DIAGNOSIS — G3184 Mild cognitive impairment, so stated: Secondary | ICD-10-CM | POA: Diagnosis not present

## 2023-05-20 DIAGNOSIS — G3184 Mild cognitive impairment, so stated: Secondary | ICD-10-CM | POA: Diagnosis not present

## 2023-05-20 DIAGNOSIS — R2689 Other abnormalities of gait and mobility: Secondary | ICD-10-CM | POA: Diagnosis not present

## 2023-05-20 DIAGNOSIS — M545 Low back pain, unspecified: Secondary | ICD-10-CM | POA: Diagnosis not present

## 2023-05-20 DIAGNOSIS — Z9181 History of falling: Secondary | ICD-10-CM | POA: Diagnosis not present

## 2023-05-20 DIAGNOSIS — R278 Other lack of coordination: Secondary | ICD-10-CM | POA: Diagnosis not present

## 2023-05-20 DIAGNOSIS — R2681 Unsteadiness on feet: Secondary | ICD-10-CM | POA: Diagnosis not present

## 2023-05-22 DIAGNOSIS — G3184 Mild cognitive impairment, so stated: Secondary | ICD-10-CM | POA: Diagnosis not present

## 2023-05-22 DIAGNOSIS — M545 Low back pain, unspecified: Secondary | ICD-10-CM | POA: Diagnosis not present

## 2023-05-22 DIAGNOSIS — R278 Other lack of coordination: Secondary | ICD-10-CM | POA: Diagnosis not present

## 2023-05-22 DIAGNOSIS — R2689 Other abnormalities of gait and mobility: Secondary | ICD-10-CM | POA: Diagnosis not present

## 2023-05-22 DIAGNOSIS — Z9181 History of falling: Secondary | ICD-10-CM | POA: Diagnosis not present

## 2023-05-22 DIAGNOSIS — R2681 Unsteadiness on feet: Secondary | ICD-10-CM | POA: Diagnosis not present

## 2023-05-23 DIAGNOSIS — Z23 Encounter for immunization: Secondary | ICD-10-CM | POA: Diagnosis not present

## 2023-05-26 DIAGNOSIS — R2681 Unsteadiness on feet: Secondary | ICD-10-CM | POA: Diagnosis not present

## 2023-05-26 DIAGNOSIS — R2689 Other abnormalities of gait and mobility: Secondary | ICD-10-CM | POA: Diagnosis not present

## 2023-05-26 DIAGNOSIS — G3184 Mild cognitive impairment, so stated: Secondary | ICD-10-CM | POA: Diagnosis not present

## 2023-05-26 DIAGNOSIS — Z9181 History of falling: Secondary | ICD-10-CM | POA: Diagnosis not present

## 2023-05-26 DIAGNOSIS — R278 Other lack of coordination: Secondary | ICD-10-CM | POA: Diagnosis not present

## 2023-05-26 DIAGNOSIS — M545 Low back pain, unspecified: Secondary | ICD-10-CM | POA: Diagnosis not present

## 2023-05-27 DIAGNOSIS — Z9181 History of falling: Secondary | ICD-10-CM | POA: Diagnosis not present

## 2023-05-27 DIAGNOSIS — G3184 Mild cognitive impairment, so stated: Secondary | ICD-10-CM | POA: Diagnosis not present

## 2023-05-27 DIAGNOSIS — R278 Other lack of coordination: Secondary | ICD-10-CM | POA: Diagnosis not present

## 2023-05-27 DIAGNOSIS — M545 Low back pain, unspecified: Secondary | ICD-10-CM | POA: Diagnosis not present

## 2023-05-27 DIAGNOSIS — R2681 Unsteadiness on feet: Secondary | ICD-10-CM | POA: Diagnosis not present

## 2023-05-27 DIAGNOSIS — R2689 Other abnormalities of gait and mobility: Secondary | ICD-10-CM | POA: Diagnosis not present

## 2023-05-29 DIAGNOSIS — R2689 Other abnormalities of gait and mobility: Secondary | ICD-10-CM | POA: Diagnosis not present

## 2023-05-29 DIAGNOSIS — Z9181 History of falling: Secondary | ICD-10-CM | POA: Diagnosis not present

## 2023-05-29 DIAGNOSIS — G3184 Mild cognitive impairment, so stated: Secondary | ICD-10-CM | POA: Diagnosis not present

## 2023-05-29 DIAGNOSIS — R278 Other lack of coordination: Secondary | ICD-10-CM | POA: Diagnosis not present

## 2023-05-29 DIAGNOSIS — M545 Low back pain, unspecified: Secondary | ICD-10-CM | POA: Diagnosis not present

## 2023-05-29 DIAGNOSIS — R2681 Unsteadiness on feet: Secondary | ICD-10-CM | POA: Diagnosis not present

## 2023-06-03 DIAGNOSIS — R278 Other lack of coordination: Secondary | ICD-10-CM | POA: Diagnosis not present

## 2023-06-03 DIAGNOSIS — R2689 Other abnormalities of gait and mobility: Secondary | ICD-10-CM | POA: Diagnosis not present

## 2023-06-03 DIAGNOSIS — Z9181 History of falling: Secondary | ICD-10-CM | POA: Diagnosis not present

## 2023-06-03 DIAGNOSIS — M545 Low back pain, unspecified: Secondary | ICD-10-CM | POA: Diagnosis not present

## 2023-06-03 DIAGNOSIS — R2681 Unsteadiness on feet: Secondary | ICD-10-CM | POA: Diagnosis not present

## 2023-06-03 DIAGNOSIS — G3184 Mild cognitive impairment, so stated: Secondary | ICD-10-CM | POA: Diagnosis not present

## 2023-06-05 DIAGNOSIS — G3184 Mild cognitive impairment, so stated: Secondary | ICD-10-CM | POA: Diagnosis not present

## 2023-06-05 DIAGNOSIS — R2681 Unsteadiness on feet: Secondary | ICD-10-CM | POA: Diagnosis not present

## 2023-06-05 DIAGNOSIS — R278 Other lack of coordination: Secondary | ICD-10-CM | POA: Diagnosis not present

## 2023-06-05 DIAGNOSIS — R2689 Other abnormalities of gait and mobility: Secondary | ICD-10-CM | POA: Diagnosis not present

## 2023-06-05 DIAGNOSIS — Z9181 History of falling: Secondary | ICD-10-CM | POA: Diagnosis not present

## 2023-06-05 DIAGNOSIS — M545 Low back pain, unspecified: Secondary | ICD-10-CM | POA: Diagnosis not present

## 2023-06-10 DIAGNOSIS — Z9181 History of falling: Secondary | ICD-10-CM | POA: Diagnosis not present

## 2023-06-10 DIAGNOSIS — R278 Other lack of coordination: Secondary | ICD-10-CM | POA: Diagnosis not present

## 2023-06-10 DIAGNOSIS — R2689 Other abnormalities of gait and mobility: Secondary | ICD-10-CM | POA: Diagnosis not present

## 2023-06-10 DIAGNOSIS — M545 Low back pain, unspecified: Secondary | ICD-10-CM | POA: Diagnosis not present

## 2023-06-10 DIAGNOSIS — R2681 Unsteadiness on feet: Secondary | ICD-10-CM | POA: Diagnosis not present

## 2023-06-10 DIAGNOSIS — G3184 Mild cognitive impairment, so stated: Secondary | ICD-10-CM | POA: Diagnosis not present

## 2023-06-13 DIAGNOSIS — G3184 Mild cognitive impairment, so stated: Secondary | ICD-10-CM | POA: Diagnosis not present

## 2023-06-13 DIAGNOSIS — R4189 Other symptoms and signs involving cognitive functions and awareness: Secondary | ICD-10-CM | POA: Diagnosis not present

## 2023-06-13 DIAGNOSIS — M6281 Muscle weakness (generalized): Secondary | ICD-10-CM | POA: Diagnosis not present

## 2023-06-13 DIAGNOSIS — Z741 Need for assistance with personal care: Secondary | ICD-10-CM | POA: Diagnosis not present

## 2023-06-13 DIAGNOSIS — R278 Other lack of coordination: Secondary | ICD-10-CM | POA: Diagnosis not present

## 2023-06-13 DIAGNOSIS — Z9181 History of falling: Secondary | ICD-10-CM | POA: Diagnosis not present

## 2023-06-13 DIAGNOSIS — R2681 Unsteadiness on feet: Secondary | ICD-10-CM | POA: Diagnosis not present

## 2023-06-13 DIAGNOSIS — M545 Low back pain, unspecified: Secondary | ICD-10-CM | POA: Diagnosis not present

## 2023-06-13 DIAGNOSIS — R2689 Other abnormalities of gait and mobility: Secondary | ICD-10-CM | POA: Diagnosis not present

## 2023-06-18 ENCOUNTER — Other Ambulatory Visit: Payer: Self-pay | Admitting: Internal Medicine

## 2023-06-18 ENCOUNTER — Other Ambulatory Visit: Payer: Self-pay

## 2023-06-18 DIAGNOSIS — Z760 Encounter for issue of repeat prescription: Secondary | ICD-10-CM

## 2023-06-18 MED ORDER — AMLODIPINE BESYLATE 2.5 MG PO TABS: 2.5000 mg | ORAL_TABLET | Freq: Every day | ORAL | 10 refills | Status: AC

## 2023-06-19 ENCOUNTER — Non-Acute Institutional Stay: Payer: Self-pay | Admitting: Nurse Practitioner

## 2023-06-19 ENCOUNTER — Encounter: Payer: Self-pay | Admitting: Nurse Practitioner

## 2023-06-19 DIAGNOSIS — Z0000 Encounter for general adult medical examination without abnormal findings: Secondary | ICD-10-CM | POA: Diagnosis not present

## 2023-06-19 NOTE — Progress Notes (Signed)
 Subjective:   Jason Velazquez is a 89 y.o. male who presents for Medicare Annual/Subsequent preventive examination.  Visit Complete: In person AL at [facility]          Objective:     Today's Vitals   06/19/23 0853 06/19/23 0902  BP: (!) 149/84 130/63  Pulse: 76   Resp: 17   Temp: 97.9 F (36.6 C)   SpO2: 94%   Weight: 189 lb (85.7 kg)   Height: 5\' 4"  (1.626 m)    Body mass index is 32.44 kg/m.     06/19/2023    9:02 AM 03/19/2023    9:40 AM 01/14/2023    2:28 PM 10/30/2022   12:48 PM 10/30/2022    7:29 AM 09/12/2022   10:51 AM 08/07/2022    2:45 PM  Advanced Directives  Does Patient Have a Medical Advance Directive? Yes Yes Yes Yes No Yes Yes  Type of Estate agent of [place];Out of facility DNR (pink MOST or yellow form) Healthcare Power of [place];Out of facility DNR (pink MOST or yellow form) Healthcare Power of [place];Out of facility DNR (pink MOST or yellow form) Healthcare Power of [place];Out of facility DNR (pink MOST or yellow form)   Healthcare Power of [place];Out of facility DNR (pink MOST or yellow form)  Does patient want to make changes to medical advance directive? No - Patient declined No - Patient declined No - Patient declined No - Patient declined  No - Patient declined No - Patient declined  Copy of Healthcare Power of Attorney in Chart? Yes - validated most recent copy scanned in chart (See row information) Yes - validated most recent copy scanned in chart (See row information) Yes - validated most recent copy scanned in chart (See row information) Yes - validated most recent copy scanned in chart (See row information)  Yes - validated most recent copy scanned in chart (See row information) Yes - validated most recent copy scanned in chart (See row information)  Would patient like information on creating a medical advance directive?     No - Patient declined      Current Medications (verified) Outpatient Encounter Medications as of  06/19/2023  Medication Sig   acetaminophen  (TYLENOL ) 325 MG tablet Take 650 mg by mouth every 4 (four) hours as needed.   acetaminophen  (TYLENOL ) 500 MG tablet Take 1,000 mg by mouth 2 (two) times daily.   alum & mag hydroxide-simeth (MAALOX/MYLANTA) 200-200-20 MG/5ML suspension Take 30 mLs by mouth every 4 (four) hours as needed for indigestion or heartburn.   amLODipine  (NORVASC ) 2.5 MG tablet Take 1 tablet (2.5 mg total) by mouth daily.   atorvastatin  (LIPITOR) 80 MG tablet Take one tablet nightly for high cholesterol   carbamide peroxide (DEBROX) 6.5 % OTIC solution Place 5 drops into both ears as needed.   cetirizine (ZYRTEC) 10 MG chewable tablet Chew 10 mg by mouth as needed for allergies.   clobetasol cream (TEMOVATE) 0.05 % Apply topically 2 (two) times daily.   clopidogrel  (PLAVIX ) 75 MG tablet Take 75 mg by mouth daily.   cyanocobalamin  (,VITAMIN B-12,) 1000 MCG/ML injection Inject 1,000 mcg into the muscle every 30 (thirty) days.   dextromethorphan-guaiFENesin  (MUCINEX  DM) 30-600 MG 12hr tablet Take 1 tablet by mouth 2 (two) times daily as needed for cough.   dextromethorphan-guaiFENesin  (ROBITUSSIN-DM) 10-100 MG/5ML liquid Take 10 mLs by mouth every 4 (four) hours as needed for cough.   Emollient (EUCERIN EX) Apply to BLE and trunk topically two  times daily   furosemide  (LASIX ) 20 MG tablet Take 20 mg by mouth as needed. For the overnight weight gain of 3 lbs or for 5lb gain in one week. Take one tablet by mouth in the morning  for 3 days.   furosemide  (LASIX ) 80 MG tablet Take 80 mg by mouth daily.   Glucose 15 GM/32ML GEL Take 1 packet by mouth as needed.   hydrocortisone 2.5 % cream Apply 1 Application topically 2 (two) times daily as needed.   isosorbide  mononitrate (IMDUR ) 30 MG 24 hr tablet Take 30 mg by mouth at bedtime.   isosorbide  mononitrate (IMDUR ) 60 MG 24 hr tablet Take 60 mg by mouth daily after breakfast.   ketoconazole  (NIZORAL ) 2 % cream Apply 1 application topically  2 (two) times daily.   Loteprednol Etabonate 0.2 % EMUL Apply 1 drop to eye as needed. Right Eye   magnesium hydroxide (MILK OF MAGNESIA) 400 MG/5ML suspension Take 30 mLs by mouth daily as needed for mild constipation.   metoprolol  succinate (TOPROL -XL) 25 MG 24 hr tablet Take 25 mg by mouth every morning.   nitroGLYCERIN  (NITROSTAT ) 0.4 MG SL tablet PLACE 1 TABLET (0.4 MG TOTAL) UNDER THE TONGUE EVERY 5 (FIVE) MINUTES AS NEEDED FOR CHEST PAIN.   nystatin  (MYCOSTATIN /NYSTOP ) powder Apply 1 Application topically 2 (two) times daily. As needed   ondansetron  (ZOFRAN ) 4 MG tablet Take 4 mg by mouth as needed for nausea or vomiting. Up to 3 times a day   OXYGEN 2lpm for dyspnea or SOB   pantoprazole  (PROTONIX ) 40 MG tablet Take 1 tablet (40 mg total) by mouth 2 (two) times daily for 2 days.   polyethylene glycol (MIRALAX  / GLYCOLAX ) packet Take 17 g by mouth daily.   potassium chloride  SA (KLOR-CON  M) 20 MEQ tablet TAKE 1 TABLET BY MOUTH ONCE DAILY  *TAKE WITH FOOD* *DO NOT CRUSH OR CHEW* *MAY DISSOLVE*   tamsulosin  (FLOMAX ) 0.4 MG CAPS capsule Take 0.4 mg by mouth daily.   doxycycline  (VIBRA -TABS) 100 MG tablet Take 1 tablet (100 mg total) by mouth 2 (two) times daily. (Patient not taking: Reported on 05/01/2023)   zinc gluconate 50 MG tablet Take 50 mg by mouth daily. (Patient not taking: Reported on 05/01/2023)   No facility-administered encounter medications on file as of 06/19/2023.    Allergies (verified) Tape, Iodine, and Niacin  and related   History: Past Medical History:  Diagnosis Date   Allergic rhinitis    Benign prostatic hypertrophy    CAD (coronary artery disease)    a. 1998 CABG x 4(LIMA-LAD, VG-D1-D2, VG-RPL); b. 2000 & 2006 PCI of the LCX/OM2; c. 03/2018 Cath: LM 25d, LAD 100ost/m, 89m/d, D2 100, fills via collats from D3, LCX 90ost/m ISR w/ evidence of stent fx, 107m, OM2 30 ISR, 80/80, RCA 70p, RPDA 60ost, 70, RPAV 100, VG->D1->D2 100p (culprit), LIMA->LAD nl, VG->RPL2 nl. EF  45%-->Med Rx; d. 03/2021 MV: ant/antsept/apical inf/inflat isch, EF 75%.   Duodenal diverticulum 02/11/2001   GERD (gastroesophageal reflux disease)    H/O hiatal hernia 02/11/2001   Heart murmur    a. 03/2018 Echo: EF 55-60%, DD, mild anterolateral HK.  Mild-mod TR. Mild Ca2+ of AoV and MV.   Hepatic lesion 02/11/2002   History of melanoma excision    FACE   Hypercholesteremia    Hypertension    Left carotid stenosis    > 50%  PER DUPLEX  03-28-2011   Mild mitral regurgitation    Perianal fistula    S/P  CABG x 4    1988   S/P drug eluting coronary stent placement    POST CABG--  STENTING 2000  AND RESTENTING 2006 FOR IN-STENT STENOSIS   Stroke [facility])    Past Surgical History:  Procedure Laterality Date   CATARACT EXTRACTION W/ INTRAOCULAR LENS  IMPLANT, BILATERAL     CORONARY ANGIOPLASTY WITH STENT PLACEMENT  2000   PCI AND STENTING CIRCUMFLEX   CORONARY ANGIOPLASTY WITH STENT PLACEMENT  04-30-2004  DR Kay Parson   RE-INSTENT STENOSIS/  DRUG-ELUTING STENT OF PROXIMAL AND MID CIRCUMFLEX/ PCI MID CIRCUMFLEX THROUGH STENT STRUT/  WIDELY PATENT SAPHENOUS VEIN GRAFT AND  LIMA TO LAD GRAFT/ TOTAL OCCLUSION RIGHT CORONARY BEYOND THE POSTERIOR DESCENDING ARTERY BRANCH WITH 50% OSTIAL NARROWING/ TOTAL OCCLUSION OF LAD AT THE OSTIUM OF THE LEFT MAIN   CORONARY ARTERY BYPASS GRAFT  1988   X5   EVALUATION UNDER ANESTHESIA WITH ANAL FISTULECTOMY N/A 07/22/2012   Procedure: EXAM UNDER ANESTHESIA WITH ANAL fistulotomy;  Surgeon: Joyce Nixon, MD;  Location: [facility];  Service: General;  Laterality: N/A;   LEFT HEART CATH AND CORS/GRAFTS ANGIOGRAPHY N/A 03/31/2018   Procedure: LEFT HEART CATH AND CORS/GRAFTS ANGIOGRAPHY;  Surgeon: Sammy Crisp, MD;  Location: [facility];  Service: Cardiovascular;  Laterality: N/A;   SHOULDER ARTHROSCOPY WITH OPEN ROTATOR CUFF REPAIR Right 07-10-1999   TONSILLECTOMY AND ADENOIDECTOMY  1944   TRANSTHORACIC ECHOCARDIOGRAM  03-01-2011   DR Kay Parson   NORMAL LVF AND LV SIZE/ MILD LEFT ATRIAL ENLARGEMENT/ GRADE II DIASTOLIC DYSFUNCTION WITH ELEVATED LEFT ATRIAL PRESSURE/ MILD TO MODERATE MR/ MILD TR   TRANSURETHRAL RESECTION OF PROSTATE  04/09/2011   Procedure: TRANSURETHRAL RESECTION OF THE PROSTATE WITH GYRUS INSTRUMENTS;  Surgeon: Willye Harvey, MD;  Location: [facility];  Service: Urology;  Laterality: N/A;   Family History  Problem Relation Age of Onset   Breast cancer Mother    Stroke Father    Colon cancer Brother    Stroke Brother    Social History   Socioeconomic History   Marital status: Widowed    Spouse name: Carlon Chester   Number of children: 3   Years of education: B.D.   Highest education level: Not on file  Occupational History   Occupation: Web designer    Comment: Retired  Tobacco Use   Smoking status: Never   Smokeless tobacco: Never  Vaping Use   Vaping status: Never Used  Substance and Sexual Activity   Alcohol  use: No   Drug use: No   Sexual activity: Not Currently  Other Topics Concern   Not on file  Social History Narrative   Lives alone, at [place].  Has some help with housecleaning but o/w he is independent.     Was married to first wife 49 years, then to second wife 12 years.  Widowed twice.        Retired Education officer, environmental, [employer]   One son was killed in [place] in 1984   One son and one daughter (she is an Charity fundraiser) still alive        Has living will   Daughter is health care POA   Has DNR    No feeding tube if cognitively unaware   Social Drivers of Corporate investment banker Strain: Not on file  Food Insecurity: Not on file  Transportation Needs: Not on file  Physical Activity: Not on file  Stress: Not on file  Social Connections: Not on file  Tobacco Counseling Counseling given: Not Answered   Clinical Intake:                        Activities of Daily Living     No data to display          Patient Care Team: Valrie Gehrig,  MD as PCP - General (Family Medicine) End, Veryl Gottron, MD as PCP - Cardiology (Cardiology) Annell Kidney, MD as Referring Physician (Ophthalmology) Verma Gobble, NP as Nurse Practitioner (Geriatric Medicine)  Indicate any recent Medical Services you may have received from other than [facility] providers in the past year (date may be approximate).     Assessment:    This is a routine wellness examination for Jason Velazquez.  Hearing/Vision screen No results found.   Goals Addressed   None    Depression Screen    03/10/2019    7:18 PM 04/13/2018   12:58 PM 02/19/2018    1:31 PM 02/18/2017   12:22 PM 11/02/2015    2:55 PM  PHQ 2/9 Scores  PHQ - 2 Score 0 0 0 0 1  PHQ- 9 Score  0 0 0     Fall Risk    03/10/2019    7:17 PM 04/13/2018   12:58 PM 02/19/2018    1:31 PM 02/18/2017   12:22 PM 11/02/2015    2:55 PM  Fall Risk   Falls in the past year? 0 0 0 No Yes    MEDICARE RISK AT HOME:    TIMED UP AND GO:  Was the test performed?  No    Cognitive Function:    02/19/2018    1:31 PM 02/18/2017   12:23 PM  MMSE - Mini Mental State Exam  Orientation to time 5 5  Orientation to Place 5 5  Registration 3 3  Attention/ Calculation 0 0  Recall 3 3  Language- name 2 objects 0 0  Language- repeat 1 1  Language- follow 3 step command 3 3  Language- read & follow direction 0 0  Write a sentence 0 0  Copy design 0 0  Total score 20 20        Immunizations Immunization History  Administered Date(s) Administered   Influenza, High Dose Seasonal PF 12/13/2013, 12/27/2014, 11/02/2015, 11/11/2017   Influenza-Unspecified 11/11/2016, 11/28/2020, 11/28/2021, 11/29/2022   Moderna Covid-19 Fall Seasonal Vaccine 72yrs & older 05/21/2022, 12/26/2022   Moderna Covid-19 Vaccine  Bivalent Booster 80yrs & up 12/21/2021   Moderna Sars-Covid-2 Vaccination 02/22/2019, 03/22/2019, 12/24/2019   Pneumococcal Conjugate-13 03/14/2014   Pneumococcal Polysaccharide-23 02/11/2013   Tdap 09/19/2015    Unspecified SARS-COV-2 Vaccination 12/24/2019, 06/30/2020, 11/03/2020, 07/10/2021   Zoster Recombinant(Shingrix) 06/12/2022, 10/11/2022   Zoster, Live 02/11/2013    TDAP status: Up to date  Flu Vaccine status: Up to date  Pneumococcal vaccine status: Up to date  Covid-19 vaccine status: Information provided on how to obtain vaccines.   Qualifies for Shingles Vaccine? Yes   Zostavax completed No   Shingrix Completed?: Yes  Screening Tests Health Maintenance  Topic Date Due   Medicare Annual Wellness (AWV)  06/06/2023   COVID-19 Vaccine (11 - Mixed Product risk 2024-25 season) 06/25/2023   INFLUENZA VACCINE  09/12/2023   DTaP/Tdap/Td (2 - Td or Tdap) 09/18/2025   Pneumonia Vaccine 46+ Years old  Completed   Zoster Vaccines- Shingrix  Completed   HPV VACCINES  Aged Out   Meningococcal B Vaccine  Aged Out    Health Maintenance  Health Maintenance  Due  Topic Date Due   Medicare Annual Wellness (AWV)  06/06/2023    Colorectal cancer screening: No longer required.   Lung Cancer Screening: (Low Dose CT Chest recommended if Age 22-80 years, 20 pack-year currently smoking OR have quit w/in 15years.) does not qualify.   Lung Cancer Screening Referral: na  Additional Screening:  Hepatitis C Screening: does not qualify  Vision Screening: Recommended annual ophthalmology exams for early detection of glaucoma and other disorders of the eye. Is the patient up to date with their annual eye exam?  Yes  Who is the provider or what is the name of the office in which the patient attends annual eye exams? [facility] If pt is not established with a provider, would they like to be referred to a provider to establish care? No .   Dental Screening: Recommended annual dental exams for proper oral hygiene   Community Resource Referral / Chronic Care Management: CRR required this visit?  No   CCM required this visit?  No     Plan:     I have personally reviewed and noted the  following in the patient's chart:   Medical and social history Use of alcohol , tobacco or illicit drugs  Current medications and supplements including opioid prescriptions. Patient is not currently taking opioid prescriptions. Functional ability and status Nutritional status Physical activity Advanced directives List of other physicians Hospitalizations, surgeries, and ER visits in previous 12 months Vitals Screenings to include cognitive, depression, and falls Referrals and appointments  In addition, I have reviewed and discussed with patient certain preventive protocols, quality metrics, and best practice recommendations. A written personalized care plan for preventive services as well as general preventive health recommendations were provided to patient.     Verma Gobble, NP   06/19/2023

## 2023-06-26 DIAGNOSIS — Z9181 History of falling: Secondary | ICD-10-CM | POA: Diagnosis not present

## 2023-06-26 DIAGNOSIS — R2681 Unsteadiness on feet: Secondary | ICD-10-CM | POA: Diagnosis not present

## 2023-06-26 DIAGNOSIS — M545 Low back pain, unspecified: Secondary | ICD-10-CM | POA: Diagnosis not present

## 2023-06-26 DIAGNOSIS — R2689 Other abnormalities of gait and mobility: Secondary | ICD-10-CM | POA: Diagnosis not present

## 2023-06-26 DIAGNOSIS — R278 Other lack of coordination: Secondary | ICD-10-CM | POA: Diagnosis not present

## 2023-06-26 DIAGNOSIS — G3184 Mild cognitive impairment, so stated: Secondary | ICD-10-CM | POA: Diagnosis not present

## 2023-07-03 DIAGNOSIS — R2689 Other abnormalities of gait and mobility: Secondary | ICD-10-CM | POA: Diagnosis not present

## 2023-07-03 DIAGNOSIS — R2681 Unsteadiness on feet: Secondary | ICD-10-CM | POA: Diagnosis not present

## 2023-07-03 DIAGNOSIS — G3184 Mild cognitive impairment, so stated: Secondary | ICD-10-CM | POA: Diagnosis not present

## 2023-07-03 DIAGNOSIS — M545 Low back pain, unspecified: Secondary | ICD-10-CM | POA: Diagnosis not present

## 2023-07-03 DIAGNOSIS — Z9181 History of falling: Secondary | ICD-10-CM | POA: Diagnosis not present

## 2023-07-03 DIAGNOSIS — R278 Other lack of coordination: Secondary | ICD-10-CM | POA: Diagnosis not present

## 2023-07-09 DIAGNOSIS — R278 Other lack of coordination: Secondary | ICD-10-CM | POA: Diagnosis not present

## 2023-07-09 DIAGNOSIS — R2681 Unsteadiness on feet: Secondary | ICD-10-CM | POA: Diagnosis not present

## 2023-07-09 DIAGNOSIS — M545 Low back pain, unspecified: Secondary | ICD-10-CM | POA: Diagnosis not present

## 2023-07-09 DIAGNOSIS — Z9181 History of falling: Secondary | ICD-10-CM | POA: Diagnosis not present

## 2023-07-09 DIAGNOSIS — R2689 Other abnormalities of gait and mobility: Secondary | ICD-10-CM | POA: Diagnosis not present

## 2023-07-09 DIAGNOSIS — G3184 Mild cognitive impairment, so stated: Secondary | ICD-10-CM | POA: Diagnosis not present

## 2023-07-24 ENCOUNTER — Other Ambulatory Visit: Payer: Self-pay | Admitting: Nurse Practitioner

## 2023-07-24 DIAGNOSIS — R278 Other lack of coordination: Secondary | ICD-10-CM | POA: Diagnosis not present

## 2023-07-24 DIAGNOSIS — R4189 Other symptoms and signs involving cognitive functions and awareness: Secondary | ICD-10-CM | POA: Diagnosis not present

## 2023-07-24 DIAGNOSIS — G3184 Mild cognitive impairment, so stated: Secondary | ICD-10-CM | POA: Diagnosis not present

## 2023-07-24 DIAGNOSIS — R2681 Unsteadiness on feet: Secondary | ICD-10-CM | POA: Diagnosis not present

## 2023-07-24 DIAGNOSIS — M6281 Muscle weakness (generalized): Secondary | ICD-10-CM | POA: Diagnosis not present

## 2023-07-24 DIAGNOSIS — Z9181 History of falling: Secondary | ICD-10-CM | POA: Diagnosis not present

## 2023-07-24 DIAGNOSIS — Z741 Need for assistance with personal care: Secondary | ICD-10-CM | POA: Diagnosis not present

## 2023-07-24 DIAGNOSIS — M545 Low back pain, unspecified: Secondary | ICD-10-CM | POA: Diagnosis not present

## 2023-07-24 DIAGNOSIS — R2689 Other abnormalities of gait and mobility: Secondary | ICD-10-CM | POA: Diagnosis not present

## 2023-07-25 ENCOUNTER — Telehealth: Payer: Self-pay

## 2023-07-25 ENCOUNTER — Non-Acute Institutional Stay (SKILLED_NURSING_FACILITY): Payer: Self-pay | Admitting: Student

## 2023-07-25 ENCOUNTER — Encounter: Payer: Self-pay | Admitting: Student

## 2023-07-25 DIAGNOSIS — I5032 Chronic diastolic (congestive) heart failure: Secondary | ICD-10-CM

## 2023-07-25 DIAGNOSIS — N1831 Chronic kidney disease, stage 3a: Secondary | ICD-10-CM

## 2023-07-25 DIAGNOSIS — I739 Peripheral vascular disease, unspecified: Secondary | ICD-10-CM

## 2023-07-25 DIAGNOSIS — F03A18 Unspecified dementia, mild, with other behavioral disturbance: Secondary | ICD-10-CM | POA: Diagnosis not present

## 2023-07-25 DIAGNOSIS — I10 Essential (primary) hypertension: Secondary | ICD-10-CM | POA: Diagnosis not present

## 2023-07-25 DIAGNOSIS — R351 Nocturia: Secondary | ICD-10-CM

## 2023-07-25 DIAGNOSIS — I25118 Atherosclerotic heart disease of native coronary artery with other forms of angina pectoris: Secondary | ICD-10-CM

## 2023-07-25 DIAGNOSIS — Z66 Do not resuscitate: Secondary | ICD-10-CM | POA: Diagnosis not present

## 2023-07-25 DIAGNOSIS — N401 Enlarged prostate with lower urinary tract symptoms: Secondary | ICD-10-CM | POA: Diagnosis not present

## 2023-07-25 DIAGNOSIS — M545 Low back pain, unspecified: Secondary | ICD-10-CM | POA: Diagnosis not present

## 2023-07-25 NOTE — Telephone Encounter (Signed)
 Patient paper faxed as requested.

## 2023-07-25 NOTE — Progress Notes (Signed)
 Provider:  Richerd MYRTIS Brigham, M.D. Location:  Other [facility]) Nursing Home Room Number: 516 [id number] Place of Service:  SNF (31)  PCP: Brigham Richerd, MD Patient Care Team: Brigham Richerd, MD as PCP - General (Family Medicine) End, Lonni, MD as PCP - Cardiology (Cardiology) Mittie Gaskin, MD as Referring Physician (Ophthalmology) Caro Harlene POUR, NP as Nurse Practitioner (Geriatric Medicine)  Extended Emergency Contact Information Primary Emergency Contact: Hebner,Rebecca  [place] Mobile Phone: [phone] Relation: Daughter  Code Status: DNR Goals of Care: Advanced Directive information    06/19/2023    9:02 AM  Advanced Directives  Does Patient Have a Medical Advance Directive? Yes  Type of Estate agent of [place];Out of facility DNR (pink MOST or yellow form)  Does patient want to make changes to medical advance directive? No - Patient declined  Copy of Healthcare Power of Attorney in Chart? Yes - validated most recent copy scanned in chart (See row information)      Chief Complaint  Patient presents with   New Admit To SNF    New Admission to [facility]     HPI: Patient is a 89 y.o. male seen today for admission to [facility].  Patient was admitted from [facility] for long term care. In [facility] he showed signs of cognitive and functional decline and the decision was made for the mood  Patient is in good spirits. He is looking forward to participating in activities in the building such as the [employer]. He also enjoys participating in group exercise.    Past Medical History:  Diagnosis Date   Allergic rhinitis    Benign prostatic hypertrophy    CAD (coronary artery disease)    a. 1998 CABG x 4(LIMA-LAD, VG-D1-D2, VG-RPL); b. 2000 & 2006 PCI of the LCX/OM2; c. 03/2018 Cath: LM 25d, LAD 100ost/m, 80m/d, D2 100, fills via collats from D3, LCX 90ost/m ISR w/ evidence of stent fx, 62m, OM2 30 ISR,  80/80, RCA 70p, RPDA 60ost, 70, RPAV 100, VG->D1->D2 100p (culprit), LIMA->LAD nl, VG->RPL2 nl. EF 45%-->Med Rx; d. 03/2021 MV: ant/antsept/apical inf/inflat isch, EF 75%.   Duodenal diverticulum 02/11/2001   GERD (gastroesophageal reflux disease)    H/O hiatal hernia 02/11/2001   Heart murmur    a. 03/2018 Echo: EF 55-60%, DD, mild anterolateral HK.  Mild-mod TR. Mild Ca2+ of AoV and MV.   Hepatic lesion 02/11/2002   History of melanoma excision    FACE   Hypercholesteremia    Hypertension    Left carotid stenosis    > 50%  PER DUPLEX  03-28-2011   Mild mitral regurgitation    Perianal fistula    S/P CABG x 4    1988   S/P drug eluting coronary stent placement    POST CABG--  STENTING 2000  AND RESTENTING 2006 FOR IN-STENT STENOSIS   Stroke [facility])    Past Surgical History:  Procedure Laterality Date   CATARACT EXTRACTION W/ INTRAOCULAR LENS  IMPLANT, BILATERAL     CORONARY ANGIOPLASTY WITH STENT PLACEMENT  2000   PCI AND STENTING CIRCUMFLEX   CORONARY ANGIOPLASTY WITH STENT PLACEMENT  04-30-2004  DR VICTORY SHARPS   RE-INSTENT STENOSIS/  DRUG-ELUTING STENT OF PROXIMAL AND MID CIRCUMFLEX/ PCI MID CIRCUMFLEX THROUGH STENT STRUT/  WIDELY PATENT SAPHENOUS VEIN GRAFT AND  LIMA TO LAD GRAFT/ TOTAL OCCLUSION RIGHT CORONARY BEYOND THE POSTERIOR DESCENDING ARTERY BRANCH WITH 50% OSTIAL NARROWING/ TOTAL OCCLUSION OF LAD AT THE OSTIUM OF THE LEFT  MAIN   CORONARY ARTERY BYPASS GRAFT  1988   X5   EVALUATION UNDER ANESTHESIA WITH ANAL FISTULECTOMY N/A 07/22/2012   Procedure: EXAM UNDER ANESTHESIA WITH ANAL fistulotomy;  Surgeon: Bernarda Ned, MD;  Location: [facility];  Service: General;  Laterality: N/A;   LEFT HEART CATH AND CORS/GRAFTS ANGIOGRAPHY N/A 03/31/2018   Procedure: LEFT HEART CATH AND CORS/GRAFTS ANGIOGRAPHY;  Surgeon: Mady Bruckner, MD;  Location: [facility] INVASIVE CV LAB;  Service: Cardiovascular;  Laterality: N/A;   SHOULDER ARTHROSCOPY WITH OPEN ROTATOR CUFF REPAIR Right  07-10-1999   TONSILLECTOMY AND ADENOIDECTOMY  1944   TRANSTHORACIC ECHOCARDIOGRAM  03-01-2011  DR VICTORY SHARPS   NORMAL LVF AND LV SIZE/ MILD LEFT ATRIAL ENLARGEMENT/ GRADE II DIASTOLIC DYSFUNCTION WITH ELEVATED LEFT ATRIAL PRESSURE/ MILD TO MODERATE MR/ MILD TR   TRANSURETHRAL RESECTION OF PROSTATE  04/09/2011   Procedure: TRANSURETHRAL RESECTION OF THE PROSTATE WITH GYRUS INSTRUMENTS;  Surgeon: Norleen JINNY Seltzer, MD;  Location: [facility];  Service: Urology;  Laterality: N/A;    reports that he has never smoked. He has never used smokeless tobacco. He reports that he does not drink alcohol  and does not use drugs. Social History   Socioeconomic History   Marital status: Widowed    Spouse name: Ronnald   Number of children: 3   Years of education: B.D.   Highest education level: Not on file  Occupational History   Occupation: Web designer    Comment: Retired  Tobacco Use   Smoking status: Never   Smokeless tobacco: Never  Vaping Use   Vaping status: Never Used  Substance and Sexual Activity   Alcohol  use: No   Drug use: No   Sexual activity: Not Currently  Other Topics Concern   Not on file  Social History Narrative   Lives alone, at [place].  Has some help with housecleaning but o/w he is independent.     Was married to first wife 49 years, then to second wife 12 years.  Widowed twice.        Retired Education officer, environmental, [employer]   One son was killed in [place] in 1984   One son and one daughter (she is an Charity fundraiser) still alive        Has living will   Daughter is health care POA   Has DNR    No feeding tube if cognitively unaware   Social Drivers of Corporate investment banker Strain: Not on file  Food Insecurity: Not on file  Transportation Needs: Not on file  Physical Activity: Not on file  Stress: Not on file  Social Connections: Not on file  Intimate Partner Violence: Not on file    Functional Status Survey:    Family History  Problem Relation Age of Onset    Breast cancer Mother    Stroke Father    Colon cancer Brother    Stroke Brother     Health Maintenance  Topic Date Due   COVID-19 Vaccine (11 - Mixed Product risk 2024-25 season) 06/25/2023   INFLUENZA VACCINE  09/12/2023   Medicare Annual Wellness (AWV)  06/18/2024   DTaP/Tdap/Td (2 - Td or Tdap) 09/18/2025   Pneumococcal Vaccine: 50+ Years  Completed   Zoster Vaccines- Shingrix  Completed   Hepatitis B Vaccines  Aged Out   HPV VACCINES  Aged Out   Meningococcal B Vaccine  Aged Out    Allergies  Allergen Reactions   Tape Rash    ONLY USE  PAPER TAPE ONLY USE PAPER TAPE   Iodine Rash    Rash when applied to skin   Niacin  And Related Hives and Other (See Comments)    FLUSHING itching    Outpatient Encounter Medications as of 07/25/2023  Medication Sig   acetaminophen  (TYLENOL ) 325 MG tablet Take 650 mg by mouth every 4 (four) hours as needed.   acetaminophen  (TYLENOL ) 500 MG tablet Take 1,000 mg by mouth 2 (two) times daily.   amLODipine  (NORVASC ) 2.5 MG tablet Take 1 tablet (2.5 mg total) by mouth daily.   atorvastatin  (LIPITOR) 80 MG tablet Take one tablet nightly for high cholesterol   cetirizine (ZYRTEC) 10 MG chewable tablet Chew 10 mg by mouth as needed for allergies.   clobetasol cream (TEMOVATE) 0.05 % Apply topically 2 (two) times daily.   clopidogrel  (PLAVIX ) 75 MG tablet Take 75 mg by mouth daily.   cyanocobalamin  (,VITAMIN B-12,) 1000 MCG/ML injection Inject 1,000 mcg into the muscle every 30 (thirty) days.   Emollient (EUCERIN EX) Apply to BLE and trunk topically two times daily   furosemide  (LASIX ) 20 MG tablet Take 20 mg by mouth as needed. For the overnight weight gain of 3 lbs or for 5lb gain in one week. Take one tablet by mouth in the morning  for 3 days.   furosemide  (LASIX ) 80 MG tablet Take 80 mg by mouth daily.   Glucose 15 GM/32ML GEL Take 1 packet by mouth as needed.   hydrocortisone 2.5 % cream Apply 1 Application topically 2 (two) times daily as  needed.   isosorbide  mononitrate (IMDUR ) 30 MG 24 hr tablet Take 30 mg by mouth at bedtime.   isosorbide  mononitrate (IMDUR ) 60 MG 24 hr tablet Take 60 mg by mouth daily after breakfast.   ketoconazole  (NIZORAL ) 2 % cream Apply 1 application topically 2 (two) times daily.   Loteprednol Etabonate 0.2 % EMUL Apply 1 drop to eye as needed. Right Eye   metoprolol  succinate (TOPROL -XL) 25 MG 24 hr tablet Take 25 mg by mouth every morning.   nitroGLYCERIN  (NITROSTAT ) 0.4 MG SL tablet PLACE 1 TABLET (0.4 MG TOTAL) UNDER THE TONGUE EVERY 5 (FIVE) MINUTES AS NEEDED FOR CHEST PAIN.   pantoprazole  (PROTONIX ) 40 MG tablet Take 1 tablet (40 mg total) by mouth 2 (two) times daily for 2 days.   polyethylene glycol (MIRALAX  / GLYCOLAX ) packet Take 17 g by mouth daily.   potassium chloride  SA (KLOR-CON  M) 20 MEQ tablet TAKE 1 TABLET BY MOUTH ONCE DAILY  *TAKE WITH FOOD* *DO NOT CRUSH OR CHEW* *MAY DISSOLVE*   tamsulosin  (FLOMAX ) 0.4 MG CAPS capsule Take 0.4 mg by mouth daily.   [DISCONTINUED] alum & mag hydroxide-simeth (MAALOX/MYLANTA) 200-200-20 MG/5ML suspension Take 30 mLs by mouth every 4 (four) hours as needed for indigestion or heartburn.   [DISCONTINUED] carbamide peroxide (DEBROX) 6.5 % OTIC solution Place 5 drops into both ears as needed.   [DISCONTINUED] dextromethorphan-guaiFENesin  (MUCINEX  DM) 30-600 MG 12hr tablet Take 1 tablet by mouth 2 (two) times daily as needed for cough.   [DISCONTINUED] dextromethorphan-guaiFENesin  (ROBITUSSIN-DM) 10-100 MG/5ML liquid Take 10 mLs by mouth every 4 (four) hours as needed for cough.   [DISCONTINUED] magnesium hydroxide (MILK OF MAGNESIA) 400 MG/5ML suspension Take 30 mLs by mouth daily as needed for mild constipation.   [DISCONTINUED] nystatin  (MYCOSTATIN /NYSTOP ) powder Apply 1 Application topically 2 (two) times daily. As needed   [DISCONTINUED] ondansetron  (ZOFRAN ) 4 MG tablet Take 4 mg by mouth as needed for nausea or vomiting.  Up to 3 times a day    [DISCONTINUED] OXYGEN 2lpm for dyspnea or SOB   [DISCONTINUED] zinc gluconate 50 MG tablet Take 50 mg by mouth daily. (Patient not taking: Reported on 05/01/2023)   No facility-administered encounter medications on file as of 07/25/2023.    Review of Systems  Vitals:   07/25/23 1439  BP: 108/68  Pulse: 76  Weight: 195 lb (88.5 kg)  Height: 5' 4 (1.626 m)   Body mass index is 33.47 kg/m. Physical Exam  Cardiovascular:     Rate and Rhythm: Normal rate.     Pulses: Normal pulses.  Pulmonary:     Effort: Pulmonary effort is normal.   Skin:    General: Skin is warm and dry.   Neurological:     Mental Status: He is alert.     Labs reviewed: Basic Metabolic Panel: Recent Labs    10/30/22 0729 01/16/23 0000 04/10/23 0000 04/14/23 0000 05/12/23 0000  NA 134*   < > 125* 126* 132*  K 4.2   < > 3.7 3.7 4.1  CL 100   < > 93* 90* 98*  CO2 26   < > 26* 28* 27*  GLUCOSE 103*  --   --   --   --   BUN 10   < > 11 12 10   CREATININE 1.05   < > 1.2 1.3 0.9  CALCIUM  8.6*   < > 7.6* 8.1* 8.4*   < > = values in this interval not displayed.   Liver Function Tests: Recent Labs    01/16/23 0000  AST 16  ALT 12  ALBUMIN 3.9   No results for input(s): LIPASE, AMYLASE in the last 8760 hours.  No results for input(s): AMMONIA in the last 8760 hours. CBC: Recent Labs    10/30/22 0729 01/16/23 0000 04/10/23 0000 05/12/23 0000  WBC 5.9 6.5 7.5 5.7  NEUTROABS  --  4,420.00 5,415.00 2,998.00  HGB 11.2* 11.0* 8.9* 10.5*  HCT 35.2* 35* 27* 33*  MCV 85.6  --   --   --   PLT 261 274 232 248   Cardiac Enzymes: No results for input(s): CKTOTAL, CKMB, CKMBINDEX, TROPONINI in the last 8760 hours. BNP: Invalid input(s): POCBNP Lab Results  Component Value Date   HGBA1C 5.8 (H) 05/27/2013   Lab Results  Component Value Date   TSH 5.46 01/16/2023   Lab Results  Component Value Date   VITAMINB12 575 12/31/2018   No results found for: FOLATE No results  found for: IRON, TIBC, FERRITIN  Imaging and Procedures obtained prior to SNF admission: DG Chest 2 View Result Date: 10/30/2022 CLINICAL DATA:  89 year old male with chest pain, lower extremity swelling. EXAM: CHEST - 2 VIEW COMPARISON:  CTA chest 12/18/2020 and earlier. FINDINGS: Seated AP and lateral views at 0741 hours. Chronic CABG, sternotomy. Similar somewhat low lung volumes to 2022. Calcified aortic atherosclerosis. Stable mediastinal contours. Moderate sized chronic gastric hiatal hernia. Small chronic right Bochdalek's hernia also. Visualized tracheal air column is within normal limits. No pneumothorax or pulmonary edema. No pleural effusion or consolidation. Ventilation appears stable from the 2022 exam. Negative visible bowel gas. No acute osseous abnormality identified. IMPRESSION: 1. No acute cardiopulmonary abnormality. 2. Chronic CABG, moderate sized gastric hiatal hernia, Aortic Atherosclerosis (ICD10-I70.0). Electronically Signed   By: VEAR Hurst M.D.   On: 10/30/2022 08:01    Assessment/Plan Coronary artery disease of native artery of native heart with stable angina pectoris (HCC)  DNR (do not  resuscitate) - Plan: Do not attempt resuscitation (DNR)  Chronic heart failure with preserved ejection fraction (HFpEF) (HCC)  Stage 3a chronic kidney disease (HCC)  Mild dementia with other behavioral disturbance, unspecified dementia type (HCC)  Benign prostatic hyperplasia with nocturia  Right-sided low back pain without sciatica, unspecified chronicity  Essential hypertension  PVD (peripheral vascular disease) (HCC) Patient with history of CHF and chronic lower extremity edema who appears euvolemic on exam.  Currently takes Lasix  80 mg daily for heart failure and a potassium supplement.  Will follow-up with BMP to evaluate for current level of electrolytes.  Given significant cardiac history patient is on high intensity statin with atorvastatin  80 mg and tolerated this  long-term.  He continues on Plavix  75 mg.  Has periodic chest pain for which he uses nitroglycerin .  He maintains a DO NOT RESUSCITATE status.  History of BPH currently on Flomax  symptoms well-controlled with this medication hypertension well-controlled at this time multi medications and isosorbide  mononitrate to aid with angina as well as hypertensive control.  Patient with history of dementia with recent continued progression.  He has had a few known episodes of sexual comments towards staff which he has previously been redirected well.  Will start finasteride in effort to aid with BPH symptoms and this could help with some of the hypersexual behavior.  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  Family/ staff Communication: Nursing  Labs/tests ordered: CBC BMP

## 2023-07-25 NOTE — Telephone Encounter (Signed)
 Copied from CRM [id number]. Topic: General - Other >> Jul 25, 2023  9:23 AM Shelby Dessert H wrote: Reason for CRM: Could you fax over the patients last visit notes on 05/08 with [facility] to [facility] at [phone]

## 2023-07-26 DIAGNOSIS — R41841 Cognitive communication deficit: Secondary | ICD-10-CM | POA: Diagnosis not present

## 2023-07-26 DIAGNOSIS — R2689 Other abnormalities of gait and mobility: Secondary | ICD-10-CM | POA: Diagnosis not present

## 2023-07-26 DIAGNOSIS — I25118 Atherosclerotic heart disease of native coronary artery with other forms of angina pectoris: Secondary | ICD-10-CM | POA: Diagnosis not present

## 2023-07-26 DIAGNOSIS — M6281 Muscle weakness (generalized): Secondary | ICD-10-CM | POA: Diagnosis not present

## 2023-07-26 DIAGNOSIS — R278 Other lack of coordination: Secondary | ICD-10-CM | POA: Diagnosis not present

## 2023-07-26 DIAGNOSIS — Z9181 History of falling: Secondary | ICD-10-CM | POA: Diagnosis not present

## 2023-07-26 DIAGNOSIS — I509 Heart failure, unspecified: Secondary | ICD-10-CM | POA: Diagnosis not present

## 2023-07-26 DIAGNOSIS — R4189 Other symptoms and signs involving cognitive functions and awareness: Secondary | ICD-10-CM | POA: Diagnosis not present

## 2023-07-26 DIAGNOSIS — I872 Venous insufficiency (chronic) (peripheral): Secondary | ICD-10-CM | POA: Diagnosis not present

## 2023-07-26 DIAGNOSIS — G3184 Mild cognitive impairment, so stated: Secondary | ICD-10-CM | POA: Diagnosis not present

## 2023-07-26 DIAGNOSIS — R2681 Unsteadiness on feet: Secondary | ICD-10-CM | POA: Diagnosis not present

## 2023-07-26 DIAGNOSIS — Z741 Need for assistance with personal care: Secondary | ICD-10-CM | POA: Diagnosis not present

## 2023-07-28 DIAGNOSIS — I10 Essential (primary) hypertension: Secondary | ICD-10-CM | POA: Diagnosis not present

## 2023-07-28 DIAGNOSIS — I110 Hypertensive heart disease with heart failure: Secondary | ICD-10-CM | POA: Diagnosis not present

## 2023-07-28 LAB — CBC AND DIFFERENTIAL
HCT: 37 — AB (ref 41–53)
Hemoglobin: 12 — AB (ref 13.5–17.5)
Neutrophils Absolute: 5445
Platelets: 264 10*3/uL (ref 150–400)
WBC: 7.5

## 2023-07-28 LAB — COMPREHENSIVE METABOLIC PANEL WITH GFR
Calcium: 9.1 (ref 8.7–10.7)
eGFR: 76

## 2023-07-28 LAB — BASIC METABOLIC PANEL WITH GFR
BUN: 7 (ref 4–21)
CO2: 30 — AB (ref 13–22)
Chloride: 94 — AB (ref 99–108)
Creatinine: 0.9 (ref 0.6–1.3)
Glucose: 115
Potassium: 3.9 meq/L (ref 3.5–5.1)
Sodium: 132 — AB (ref 137–147)

## 2023-07-28 LAB — CBC: RBC: 4.27 (ref 3.87–5.11)

## 2023-07-29 DIAGNOSIS — M6281 Muscle weakness (generalized): Secondary | ICD-10-CM | POA: Diagnosis not present

## 2023-07-29 DIAGNOSIS — I872 Venous insufficiency (chronic) (peripheral): Secondary | ICD-10-CM | POA: Diagnosis not present

## 2023-07-29 DIAGNOSIS — I25118 Atherosclerotic heart disease of native coronary artery with other forms of angina pectoris: Secondary | ICD-10-CM | POA: Diagnosis not present

## 2023-07-29 DIAGNOSIS — R278 Other lack of coordination: Secondary | ICD-10-CM | POA: Diagnosis not present

## 2023-07-29 DIAGNOSIS — Z741 Need for assistance with personal care: Secondary | ICD-10-CM | POA: Diagnosis not present

## 2023-07-29 DIAGNOSIS — R41841 Cognitive communication deficit: Secondary | ICD-10-CM | POA: Diagnosis not present

## 2023-07-31 DIAGNOSIS — M6281 Muscle weakness (generalized): Secondary | ICD-10-CM | POA: Diagnosis not present

## 2023-07-31 DIAGNOSIS — R278 Other lack of coordination: Secondary | ICD-10-CM | POA: Diagnosis not present

## 2023-07-31 DIAGNOSIS — R41841 Cognitive communication deficit: Secondary | ICD-10-CM | POA: Diagnosis not present

## 2023-07-31 DIAGNOSIS — Z741 Need for assistance with personal care: Secondary | ICD-10-CM | POA: Diagnosis not present

## 2023-07-31 DIAGNOSIS — I872 Venous insufficiency (chronic) (peripheral): Secondary | ICD-10-CM | POA: Diagnosis not present

## 2023-07-31 DIAGNOSIS — I25118 Atherosclerotic heart disease of native coronary artery with other forms of angina pectoris: Secondary | ICD-10-CM | POA: Diagnosis not present

## 2023-08-01 DIAGNOSIS — M6281 Muscle weakness (generalized): Secondary | ICD-10-CM | POA: Diagnosis not present

## 2023-08-01 DIAGNOSIS — R41841 Cognitive communication deficit: Secondary | ICD-10-CM | POA: Diagnosis not present

## 2023-08-01 DIAGNOSIS — I872 Venous insufficiency (chronic) (peripheral): Secondary | ICD-10-CM | POA: Diagnosis not present

## 2023-08-01 DIAGNOSIS — I25118 Atherosclerotic heart disease of native coronary artery with other forms of angina pectoris: Secondary | ICD-10-CM | POA: Diagnosis not present

## 2023-08-01 DIAGNOSIS — R278 Other lack of coordination: Secondary | ICD-10-CM | POA: Diagnosis not present

## 2023-08-01 DIAGNOSIS — Z741 Need for assistance with personal care: Secondary | ICD-10-CM | POA: Diagnosis not present

## 2023-08-02 DIAGNOSIS — I872 Venous insufficiency (chronic) (peripheral): Secondary | ICD-10-CM | POA: Diagnosis not present

## 2023-08-02 DIAGNOSIS — Z741 Need for assistance with personal care: Secondary | ICD-10-CM | POA: Diagnosis not present

## 2023-08-02 DIAGNOSIS — R41841 Cognitive communication deficit: Secondary | ICD-10-CM | POA: Diagnosis not present

## 2023-08-02 DIAGNOSIS — R278 Other lack of coordination: Secondary | ICD-10-CM | POA: Diagnosis not present

## 2023-08-02 DIAGNOSIS — I25118 Atherosclerotic heart disease of native coronary artery with other forms of angina pectoris: Secondary | ICD-10-CM | POA: Diagnosis not present

## 2023-08-02 DIAGNOSIS — M6281 Muscle weakness (generalized): Secondary | ICD-10-CM | POA: Diagnosis not present

## 2023-08-05 ENCOUNTER — Encounter: Payer: Self-pay | Admitting: Student

## 2023-08-08 DIAGNOSIS — R41841 Cognitive communication deficit: Secondary | ICD-10-CM | POA: Diagnosis not present

## 2023-08-08 DIAGNOSIS — Z741 Need for assistance with personal care: Secondary | ICD-10-CM | POA: Diagnosis not present

## 2023-08-08 DIAGNOSIS — I25118 Atherosclerotic heart disease of native coronary artery with other forms of angina pectoris: Secondary | ICD-10-CM | POA: Diagnosis not present

## 2023-08-08 DIAGNOSIS — I872 Venous insufficiency (chronic) (peripheral): Secondary | ICD-10-CM | POA: Diagnosis not present

## 2023-08-08 DIAGNOSIS — R278 Other lack of coordination: Secondary | ICD-10-CM | POA: Diagnosis not present

## 2023-08-08 DIAGNOSIS — M6281 Muscle weakness (generalized): Secondary | ICD-10-CM | POA: Diagnosis not present

## 2023-08-14 ENCOUNTER — Encounter: Payer: Self-pay | Admitting: Nurse Practitioner

## 2023-08-14 ENCOUNTER — Non-Acute Institutional Stay (SKILLED_NURSING_FACILITY): Payer: Self-pay | Admitting: Nurse Practitioner

## 2023-08-14 DIAGNOSIS — K219 Gastro-esophageal reflux disease without esophagitis: Secondary | ICD-10-CM | POA: Diagnosis not present

## 2023-08-14 DIAGNOSIS — R351 Nocturia: Secondary | ICD-10-CM | POA: Diagnosis not present

## 2023-08-14 DIAGNOSIS — M6281 Muscle weakness (generalized): Secondary | ICD-10-CM | POA: Diagnosis not present

## 2023-08-14 DIAGNOSIS — F03A18 Unspecified dementia, mild, with other behavioral disturbance: Secondary | ICD-10-CM | POA: Diagnosis not present

## 2023-08-14 DIAGNOSIS — K439 Ventral hernia without obstruction or gangrene: Secondary | ICD-10-CM

## 2023-08-14 DIAGNOSIS — N401 Enlarged prostate with lower urinary tract symptoms: Secondary | ICD-10-CM | POA: Diagnosis not present

## 2023-08-14 DIAGNOSIS — F03A0 Unspecified dementia, mild, without behavioral disturbance, psychotic disturbance, mood disturbance, and anxiety: Secondary | ICD-10-CM | POA: Insufficient documentation

## 2023-08-14 DIAGNOSIS — Z741 Need for assistance with personal care: Secondary | ICD-10-CM | POA: Diagnosis not present

## 2023-08-14 DIAGNOSIS — I5032 Chronic diastolic (congestive) heart failure: Secondary | ICD-10-CM | POA: Diagnosis not present

## 2023-08-14 DIAGNOSIS — K5904 Chronic idiopathic constipation: Secondary | ICD-10-CM

## 2023-08-14 DIAGNOSIS — I10 Essential (primary) hypertension: Secondary | ICD-10-CM

## 2023-08-14 DIAGNOSIS — Z9181 History of falling: Secondary | ICD-10-CM | POA: Diagnosis not present

## 2023-08-14 DIAGNOSIS — I509 Heart failure, unspecified: Secondary | ICD-10-CM | POA: Diagnosis not present

## 2023-08-14 DIAGNOSIS — I25118 Atherosclerotic heart disease of native coronary artery with other forms of angina pectoris: Secondary | ICD-10-CM

## 2023-08-14 DIAGNOSIS — R2689 Other abnormalities of gait and mobility: Secondary | ICD-10-CM | POA: Diagnosis not present

## 2023-08-14 DIAGNOSIS — G3184 Mild cognitive impairment, so stated: Secondary | ICD-10-CM | POA: Diagnosis not present

## 2023-08-14 DIAGNOSIS — R4189 Other symptoms and signs involving cognitive functions and awareness: Secondary | ICD-10-CM | POA: Diagnosis not present

## 2023-08-14 DIAGNOSIS — I872 Venous insufficiency (chronic) (peripheral): Secondary | ICD-10-CM | POA: Diagnosis not present

## 2023-08-14 DIAGNOSIS — R2681 Unsteadiness on feet: Secondary | ICD-10-CM | POA: Diagnosis not present

## 2023-08-14 DIAGNOSIS — R41841 Cognitive communication deficit: Secondary | ICD-10-CM | POA: Diagnosis not present

## 2023-08-14 DIAGNOSIS — R278 Other lack of coordination: Secondary | ICD-10-CM | POA: Diagnosis not present

## 2023-08-14 NOTE — Assessment & Plan Note (Signed)
 Euvolemic on current regimen, continues on lasix  with metoprolol .

## 2023-08-14 NOTE — Assessment & Plan Note (Signed)
 Noted today, soft, easily reducible, reassurance given.

## 2023-08-14 NOTE — Progress Notes (Signed)
 Location:  Other [place].  Nursing Home Room Number: [facility] [id number] Place of Service:  SNF [phone]) Harlene An, NP  PCP: Abdul Fine, MD  Patient Care Team: Abdul Fine, MD as PCP - General (Family Medicine) End, Lonni, MD as PCP - Cardiology (Cardiology) Mittie Gaskin, MD as Referring Physician (Ophthalmology) An Harlene POUR, NP as Nurse Practitioner (Geriatric Medicine)  Extended Emergency Contact Information Primary Emergency Contact: Hebner,Rebecca  [place] Mobile Phone: [phone] Relation: Daughter  Goals of care: Advanced Directive information    06/19/2023    9:02 AM  Advanced Directives  Does Patient Have a Medical Advance Directive? Yes  Type of Estate agent of [place];Out of facility DNR (pink MOST or yellow form)  Does patient want to make changes to medical advance directive? No - Patient declined  Copy of Healthcare Power of Attorney in Chart? Yes - validated most recent copy scanned in chart (See row information)     Chief Complaint  Patient presents with   Medical Management of Chronic Issues    Medical Management of Chronic Issues. Complains of Knot in Abdomen.     HPI:  Pt is a 89 y.o. male seen today for medical management of chronic disease.  Pt with hx of CAD, CHF, dementia, frequent falls. He moved to SNF last month and is doing well. Pt reports yesterday is was in exercise class and experienced fluid coming up into his mouth Reports occasional GERD and takes Protonix  twice daily with occasional PRN   He noticed a knot in his abdomen yesterday and ask for evaluation.  No pain noted.  Reports occasional constipation but take miralax  daily    Past Medical History:  Diagnosis Date   Allergic rhinitis    Benign prostatic hypertrophy    CAD (coronary artery disease)    a. 1998 CABG x 4(LIMA-LAD, VG-D1-D2, VG-RPL); b. 2000 & 2006 PCI of the LCX/OM2; c. 03/2018 Cath: LM  25d, LAD 100ost/m, 74m/d, D2 100, fills via collats from D3, LCX 90ost/m ISR w/ evidence of stent fx, 23m, OM2 30 ISR, 80/80, RCA 70p, RPDA 60ost, 70, RPAV 100, VG->D1->D2 100p (culprit), LIMA->LAD nl, VG->RPL2 nl. EF 45%-->Med Rx; d. 03/2021 MV: ant/antsept/apical inf/inflat isch, EF 75%.   Duodenal diverticulum 02/11/2001   GERD (gastroesophageal reflux disease)    H/O hiatal hernia 02/11/2001   Heart murmur    a. 03/2018 Echo: EF 55-60%, DD, mild anterolateral HK.  Mild-mod TR. Mild Ca2+ of AoV and MV.   Hepatic lesion 02/11/2002   History of melanoma excision    FACE   Hypercholesteremia    Hypertension    Left carotid stenosis    > 50%  PER DUPLEX  03-28-2011   Mild mitral regurgitation    Perianal fistula    S/P CABG x 4    1988   S/P drug eluting coronary stent placement    POST CABG--  STENTING 2000  AND RESTENTING 2006 FOR IN-STENT STENOSIS   Stroke [facility])    Past Surgical History:  Procedure Laterality Date   CATARACT EXTRACTION W/ INTRAOCULAR LENS  IMPLANT, BILATERAL     CORONARY ANGIOPLASTY WITH STENT PLACEMENT  2000   PCI AND STENTING CIRCUMFLEX   CORONARY ANGIOPLASTY WITH STENT PLACEMENT  04-30-2004  DR VICTORY SHARPS   RE-INSTENT STENOSIS/  DRUG-ELUTING STENT OF PROXIMAL AND MID CIRCUMFLEX/ PCI MID CIRCUMFLEX THROUGH STENT STRUT/  WIDELY PATENT SAPHENOUS VEIN GRAFT AND  LIMA TO LAD GRAFT/ TOTAL OCCLUSION RIGHT CORONARY BEYOND  THE POSTERIOR DESCENDING ARTERY BRANCH WITH 50% OSTIAL NARROWING/ TOTAL OCCLUSION OF LAD AT THE OSTIUM OF THE LEFT MAIN   CORONARY ARTERY BYPASS GRAFT  1988   X5   EVALUATION UNDER ANESTHESIA WITH ANAL FISTULECTOMY N/A 07/22/2012   Procedure: EXAM UNDER ANESTHESIA WITH ANAL fistulotomy;  Surgeon: Bernarda Ned, MD;  Location: [facility];  Service: General;  Laterality: N/A;   LEFT HEART CATH AND CORS/GRAFTS ANGIOGRAPHY N/A 03/31/2018   Procedure: LEFT HEART CATH AND CORS/GRAFTS ANGIOGRAPHY;  Surgeon: Mady Bruckner, MD;  Location: [facility]  INVASIVE CV LAB;  Service: Cardiovascular;  Laterality: N/A;   SHOULDER ARTHROSCOPY WITH OPEN ROTATOR CUFF REPAIR Right 07-10-1999   TONSILLECTOMY AND ADENOIDECTOMY  1944   TRANSTHORACIC ECHOCARDIOGRAM  03-01-2011  DR VICTORY SHARPS   NORMAL LVF AND LV SIZE/ MILD LEFT ATRIAL ENLARGEMENT/ GRADE II DIASTOLIC DYSFUNCTION WITH ELEVATED LEFT ATRIAL PRESSURE/ MILD TO MODERATE MR/ MILD TR   TRANSURETHRAL RESECTION OF PROSTATE  04/09/2011   Procedure: TRANSURETHRAL RESECTION OF THE PROSTATE WITH GYRUS INSTRUMENTS;  Surgeon: Norleen JINNY Seltzer, MD;  Location: [facility];  Service: Urology;  Laterality: N/A;    Allergies  Allergen Reactions   Tape Rash    ONLY USE PAPER TAPE ONLY USE PAPER TAPE   Iodine Rash    Rash when applied to skin   Niacin  And Related Hives and Other (See Comments)    FLUSHING itching    Outpatient Encounter Medications as of 08/14/2023  Medication Sig   acetaminophen  (TYLENOL ) 500 MG tablet Take 1,000 mg by mouth 2 (two) times daily.   amLODipine  (NORVASC ) 2.5 MG tablet Take 1 tablet (2.5 mg total) by mouth daily.   atorvastatin  (LIPITOR) 80 MG tablet Take one tablet nightly for high cholesterol   clopidogrel  (PLAVIX ) 75 MG tablet Take 75 mg by mouth daily.   cyanocobalamin  (,VITAMIN B-12,) 1000 MCG/ML injection Inject 1,000 mcg into the muscle every 30 (thirty) days.   Emollient (EUCERIN EX) Apply to BLE and trunk topically two times daily   finasteride (PROSCAR) 5 MG tablet Take 5 mg by mouth daily.   furosemide  (LASIX ) 80 MG tablet Take 80 mg by mouth daily. Give 80mg  by mouth every 24 hours as needed for weight gain of 3lbs overnight or 5lbs in 1 week.   isosorbide  mononitrate (IMDUR ) 30 MG 24 hr tablet Take 30 mg by mouth at bedtime.   isosorbide  mononitrate (IMDUR ) 60 MG 24 hr tablet Take 60 mg by mouth daily after breakfast.   nitroGLYCERIN  (NITROSTAT ) 0.4 MG SL tablet PLACE 1 TABLET (0.4 MG TOTAL) UNDER THE TONGUE EVERY 5 (FIVE) MINUTES AS NEEDED FOR CHEST PAIN.   pantoprazole   (PROTONIX ) 40 MG tablet Take 1 tablet (40 mg total) by mouth 2 (two) times daily for 2 days.   polyethylene glycol (MIRALAX  / GLYCOLAX ) packet Take 17 g by mouth daily.   potassium chloride  SA (KLOR-CON  M) 20 MEQ tablet TAKE 1 TABLET BY MOUTH ONCE DAILY  *TAKE WITH FOOD* *DO NOT CRUSH OR CHEW* *MAY DISSOLVE*   tamsulosin  (FLOMAX ) 0.4 MG CAPS capsule Take 0.4 mg by mouth daily.   acetaminophen  (TYLENOL ) 325 MG tablet Take 650 mg by mouth every 4 (four) hours as needed. (Patient not taking: Reported on 08/14/2023)   cetirizine (ZYRTEC) 10 MG chewable tablet Chew 10 mg by mouth as needed for allergies. (Patient not taking: Reported on 08/14/2023)   clobetasol cream (TEMOVATE) 0.05 % Apply topically 2 (two) times daily. (Patient not taking: Reported on 08/14/2023)   furosemide  (  LASIX ) 20 MG tablet Take 20 mg by mouth as needed. For the overnight weight gain of 3 lbs or for 5lb gain in one week. Take one tablet by mouth in the morning  for 3 days. (Patient not taking: Reported on 08/14/2023)   Glucose 15 GM/32ML GEL Take 1 packet by mouth as needed. (Patient not taking: Reported on 08/14/2023)   hydrocortisone 2.5 % cream Apply 1 Application topically 2 (two) times daily as needed. (Patient not taking: Reported on 08/14/2023)   ketoconazole  (NIZORAL ) 2 % cream Apply 1 application topically 2 (two) times daily. (Patient not taking: Reported on 08/14/2023)   Loteprednol Etabonate 0.2 % EMUL Apply 1 drop to eye as needed. Right Eye (Patient not taking: Reported on 08/14/2023)   metoprolol  succinate (TOPROL -XL) 25 MG 24 hr tablet Take 25 mg by mouth every morning. (Patient not taking: Reported on 08/14/2023)   No facility-administered encounter medications on file as of 08/14/2023.    Review of Systems  Constitutional:  Negative for activity change, appetite change, fatigue and unexpected weight change.  HENT:  Negative for congestion and hearing loss.   Eyes: Negative.   Respiratory:  Negative for cough and shortness of  breath.   Cardiovascular:  Negative for chest pain, palpitations and leg swelling.  Gastrointestinal:  Positive for constipation (occasional). Negative for abdominal pain and diarrhea.  Genitourinary:  Negative for difficulty urinating and dysuria.  Musculoskeletal:  Negative for arthralgias and myalgias.  Skin:  Negative for color change and wound.  Neurological:  Negative for dizziness and weakness.  Psychiatric/Behavioral:  Positive for confusion. Negative for agitation and behavioral problems.      Immunization History  Administered Date(s) Administered   Influenza, High Dose Seasonal PF 12/13/2013, 12/27/2014, 11/02/2015, 11/11/2017   Influenza-Unspecified 11/11/2016, 11/28/2020, 11/28/2021, 11/29/2022   Moderna Covid-19 Fall Seasonal Vaccine 43yrs & older 05/21/2022, 12/26/2022   Moderna Covid-19 Vaccine  Bivalent Booster 78yrs & up 12/21/2021   Moderna Sars-Covid-2 Vaccination 02/22/2019, 03/22/2019, 12/24/2019   Pneumococcal Conjugate-13 03/14/2014   Pneumococcal Polysaccharide-23 02/11/2013   Tdap 09/19/2015   Unspecified SARS-COV-2 Vaccination 12/24/2019, 06/30/2020, 11/03/2020, 07/10/2021, 05/23/2023   Zoster Recombinant(Shingrix) 06/12/2022, 10/11/2022   Zoster, Live 02/11/2013   Pertinent  Health Maintenance Due  Topic Date Due   INFLUENZA VACCINE  09/12/2023      04/01/2018    8:00 PM 04/13/2018   12:58 PM 03/10/2019    7:17 PM 12/18/2020    2:40 AM 06/19/2023    1:56 PM  Fall Risk  Falls in the past year?  0  0   1  (RETIRED) Patient Fall Risk Level Moderate fall risk    Low fall risk    Patient at Risk for Falls Due to     History of fall(s);Impaired balance/gait;Impaired mobility     Data saved with a previous flowsheet row definition   Functional Status Survey:    Vitals:   08/14/23 1033  BP: (!) 150/76  Pulse: 88  Resp: 20  Temp: (!) 97.3 F (36.3 C)  SpO2: 97%  Weight: 195 lb (88.5 kg)  Height: 5' 4 (1.626 m)   Body mass index is 33.47  kg/m. Physical Exam Constitutional:      General: He is not in acute distress.    Appearance: He is well-developed. He is not diaphoretic.  HENT:     Head: Normocephalic and atraumatic.     Right Ear: External ear normal.     Left Ear: External ear normal.     Mouth/Throat:  Pharynx: No oropharyngeal exudate.  Eyes:     Conjunctiva/sclera: Conjunctivae normal.     Pupils: Pupils are equal, round, and reactive to light.  Cardiovascular:     Rate and Rhythm: Normal rate and regular rhythm.     Heart sounds: Normal heart sounds.  Pulmonary:     Effort: Pulmonary effort is normal.     Breath sounds: Normal breath sounds.  Abdominal:     General: Bowel sounds are normal.     Palpations: Abdomen is soft.     Hernia: A hernia is present. Hernia is present in the ventral area (soft and reducible).  Musculoskeletal:        General: No tenderness.     Cervical back: Normal range of motion and neck supple.     Right lower leg: No edema.     Left lower leg: No edema.  Skin:    General: Skin is warm and dry.  Neurological:     Mental Status: He is alert and oriented to person, place, and time.     Labs reviewed: Recent Labs    10/30/22 0729 01/16/23 0000 04/14/23 0000 05/12/23 0000 07/28/23 0000  NA 134*   < > 126* 132* 132*  K 4.2   < > 3.7 4.1 3.9  CL 100   < > 90* 98* 94*  CO2 26   < > 28* 27* 30*  GLUCOSE 103*  --   --   --   --   BUN 10   < > 12 10 7   CREATININE 1.05   < > 1.3 0.9 0.9  CALCIUM  8.6*   < > 8.1* 8.4* 9.1   < > = values in this interval not displayed.   Recent Labs    01/16/23 0000  AST 16  ALT 12  ALBUMIN 3.9   Recent Labs    10/30/22 0729 01/16/23 0000 04/10/23 0000 05/12/23 0000 07/28/23 0000  WBC 5.9   < > 7.5 5.7 7.5  NEUTROABS  --    < > 5,415.00 2,998.00 5,445.00  HGB 11.2*   < > 8.9* 10.5* 12.0*  HCT 35.2*   < > 27* 33* 37*  MCV 85.6  --   --   --   --   PLT 261   < > 232 248 264   < > = values in this interval not displayed.    Lab Results  Component Value Date   TSH 5.46 01/16/2023   Lab Results  Component Value Date   HGBA1C 5.8 (H) 05/27/2013   Lab Results  Component Value Date   CHOL 106 01/16/2023   HDL 39 01/16/2023   LDLCALC 50 01/16/2023   TRIG 85 01/16/2023   CHOLHDL 3.2 10/11/2021    Significant Diagnostic Results in last 30 days:  No results found.  Assessment/Plan BPH (benign prostatic hyperplasia) Stable, continue on the tamsulosin  0.4mg  daily and proscar.   Chronic heart failure with preserved ejection fraction (HFpEF) (HCC) Euvolemic on current regimen, continues on lasix  with metoprolol .   Chronic idiopathic constipation Continues on miralax  daily, staff will monitor to ensure no worsening of symptoms   Coronary artery disease of native artery of native heart with stable angina pectoris (HCC) stable No angina on the isosorbide  ASA 81mg , clopidogrel  75 and atorvastatin  80  Essential hypertension Blood pressure well controlled, goal bp <140/90 Continue current medications and dietary modifications follow metabolic panel   GERD (gastroesophageal reflux disease) Continues on protonix  40 mg by mouth twice daily  Mild dementia (HCC) Stable, no acute changes in cognitive or functional status, continue supportive care.   Ventral hernia without obstruction or gangrene Noted today, soft, easily reducible, reassurance given.      Cerissa Zeiger K. Caro BODILY [facility] [phone]

## 2023-08-14 NOTE — Assessment & Plan Note (Signed)
 Stable, no acute changes in cognitive or functional status, continue supportive care.

## 2023-08-14 NOTE — Assessment & Plan Note (Signed)
 stable No angina on the isosorbide  ASA 81mg , clopidogrel  75 and atorvastatin  80

## 2023-08-14 NOTE — Assessment & Plan Note (Signed)
 Continues on miralax  daily, staff will monitor to ensure no worsening of symptoms

## 2023-08-14 NOTE — Assessment & Plan Note (Addendum)
 Stable, continue on the tamsulosin  0.4mg  daily and proscar.

## 2023-08-14 NOTE — Assessment & Plan Note (Signed)
 Blood pressure well controlled, goal bp <140/90 Continue current medications and dietary modifications follow metabolic panel

## 2023-08-14 NOTE — Assessment & Plan Note (Signed)
 Continues on protonix  40 mg by mouth twice daily

## 2023-08-20 DIAGNOSIS — R278 Other lack of coordination: Secondary | ICD-10-CM | POA: Diagnosis not present

## 2023-08-20 DIAGNOSIS — R41841 Cognitive communication deficit: Secondary | ICD-10-CM | POA: Diagnosis not present

## 2023-08-20 DIAGNOSIS — Z741 Need for assistance with personal care: Secondary | ICD-10-CM | POA: Diagnosis not present

## 2023-08-20 DIAGNOSIS — M6281 Muscle weakness (generalized): Secondary | ICD-10-CM | POA: Diagnosis not present

## 2023-08-20 DIAGNOSIS — I25118 Atherosclerotic heart disease of native coronary artery with other forms of angina pectoris: Secondary | ICD-10-CM | POA: Diagnosis not present

## 2023-08-20 DIAGNOSIS — I872 Venous insufficiency (chronic) (peripheral): Secondary | ICD-10-CM | POA: Diagnosis not present

## 2023-08-20 NOTE — Progress Notes (Signed)
 Cardiology Clinic Note   Date: 08/22/2023 ID: Jason Velazquez, DOB 1928/11/22, MRN [id number]  Primary Cardiologist:  Lonni Hanson, MD  Chief Complaint   Jason Velazquez is a 89 y.o. male who presents to the clinic today for routine follow up.   Patient Profile   Jason Velazquez is followed by Dr. Hanson for the history outlined below.       Past medical history significant for: CAD. CABG x 4 1997: LIMA to LAD, sequential SVG to D1 and D2, SVG to RPL. PCI to proximal LCx/OM 2000. LHC 04/24/2004 (angina): Significant in-stent restenosis proximal LCx.  Widely patent SVG and LIMA.  Total occlusion of LAD at the ostium of LM.  Total occlusion of RCA beyond posterior descending branch. Staged intervention 04/30/2004: PCI with DES proximal to mid LCx covering previously stented region.  PTCA of high-grade restenosis mid LCx via the previously placed stent with reduction from 95% to 80% with TIMI-3 flow. LHC 03/31/2018 (NSTEMI): Severe three-vessel coronary artery disease including chronic total occlusion of the ostial and mid LAD, in-stent restenosis of overlapping proximal LCx stents complicated by stent fracture, sequential 80% OM2 stenoses, 80% jailed AV groove LCx stenosis, 70% proximal RCA lesion, sequential 60% and 70% ostial and distal RPDA stenoses and chronic total occlusion of the rPL. Widely patent LIMA to LAD and SVG to rPL.SABRA Occlusion of sequential SVG to diagonal branches. Culprit lesion with acute occlusion on chronic subtotal occlusion, given that collaterals have already developed. Mildly to moderately reduced left ventricular systolic function (EF 45%) with mid anterior akinesis. Mildly reduced left ventricular filling pressure. Nuclear stress test 03/16/2021: Moderate to high risk study.  Region of ischemia noted in the basal to distal anterior wall, basal anteroseptal region, distal/apical inferior/inferolateral wall.  Normal wall motion.  No EKG changes concerning for ischemia at peak  stress or in recovery. Chronic diastolic heart failure. Echo 02/26/2022: EF 60 to 65%.  No RWMA.  Mild to moderate LVH.  Grade I DD.  Normal RV size/function.  Normal PA pressure, RVSP 37 mmHg.  Mild MR.  Aortic valve sclerosis without stenosis.  Mild to moderate TR. PAD. Hypertension. Hyperlipidemia. Lipid panel 01/16/2023: LDL 50, HDL 39, TG 85, total 106. GERD. CVA.  In summary, patient with extensive history of CAD s/p CABG times 05/31/1995 and further PCI as outlined above.  In October 2022 patient complained of bilateral clavicular pain and Imdur  dose was increased.  In November he had an episode of central chest pain waking him from sleep lasting 2 to 3 minutes.  He was evaluated in the ED and chest x-ray was concerning for possible left lower lobe infiltrate but CT showed no acute process.  Troponin negative x 2.  He was evaluated in the office in January 2023 with continued episodes of clavicular pain occurring at rest and awakening him from sleep.  He continued to walk a mile 6 days a week without symptoms or limitations.  Nuclear stress testing showed regions of ischemia as detailed above.  The study was reviewed by Dr. Hanson who recommended ongoing medical therapy reserving repeat LHC for worsening or more severe symptoms.  Isosorbide  was increased to 90 mg daily.  In August 2023 patient complained of continued bilateral clavicular pain when lying on his sides in bed at night.  He reported worsening lower extremity edema and increased weight.  Lasix  was increased.  Echo January 2024 showed normal LV/RV function as detailed above.  Patient was last seen in the office by me on  03/24/2023 for routine follow-up.  He reported 2 episodes of sharp central chest pain without any associated symptoms.  He reported 1 episode 4 weeks prior that resolved quickly not requiring NTG.  The second episode occurred 1 week prior to visit while walking back to his room for breakfast.  He opted to skip church and rest  instead and the pain resolved.  He was participating in his usual activities of exercising 5 days a week at his living facility with no pain or dyspnea.  Patient and daughter opted for medication titration before pursuing ischemic evaluation.  Isosorbide  was adjusted to 60 mg in the morning and 30 in the evening.     History of Present Illness    Today, patient is accompanied by a CNA from [facility]. He is doing well with no complaints today. Patient denies shortness of breath, dyspnea on exertion, lower extremity edema, orthopnea or PND. No chest pain, pressure, or tightness. No palpitations.  He does seated exercises for 15 minutes 4-5 days a week. He is also working on balance with physical therapy.     ROS: All other systems reviewed and are otherwise negative except as noted in History of Present Illness.  EKGs/Labs Reviewed    EKG Interpretation Date/Time:  Friday August 22 2023 10:57:38 EDT Ventricular Rate:  73 PR Interval:  156 QRS Duration:  86 QT Interval:  398 QTC Calculation: 438 R Axis:   -3  Text Interpretation: Normal sinus rhythm Nonspecific ST and T wave abnormality When compared with ECG of 30-Oct-2022 07:25, Questionable change in QRS axis Confirmed by Loistine Jason [phone]) on 08/22/2023 11:00:29 AM   01/16/2023: ALT 12; AST 16 07/28/2023: BUN 7; Creatinine 0.9; Potassium 3.9; Sodium 132   07/28/2023: Hemoglobin 12.0; WBC 7.5   01/16/2023: TSH 5.46    Physical Exam    VS:  BP 118/74   Pulse 73   Ht 5' 6 (1.676 m)   Wt 187 lb 6.4 oz (85 kg)   SpO2 98%   BMI 30.25 kg/m  , BMI Body mass index is 30.25 kg/m.  GEN: Well nourished, well developed, in no acute distress. Neck: No JVD or carotid bruits. Cardiac:  RRR.  No murmur. No rubs or gallops.   Respiratory:  Respirations regular and unlabored. Clear to auscultation without rales, wheezing or rhonchi. GI: Soft, nontender, nondistended. Extremities: Radials/DP/PT 2+ and equal bilaterally. No clubbing or  cyanosis. No edema.   Skin: Warm and dry, no rash. Neuro: Strength intact.  Assessment & Plan   CAD S/p CABG x 4 1997 and subsequent PCI with as detailed in patient profile.  Nuclear stress test February 2023 showed areas of ischemia with recommendation to continue medical treatment.  Patient denies chest pain, pressure or tightness. He participates in seated exercises for 15 minutes 4-5 days a week. He is also working with PT to improve balance.  -Continue Plavix , amlodipine , atorvastatin , Toprol , isosorbide , as needed SL NTG.   Chronic diastolic heart failure Echo January 2024 showed normal LV/RV function, LVH, Grade I DD, normal PA pressure, mild MR, mild to moderate TR.  Patient denies lower extremity edema, orthopnea or PND. Euvolemic and well compensated on exam.  -Continue Lasix , isosorbide .   Hypertension BP today 118/74. No report of headaches or dizziness.  -Continue amlodipine , Toprol , isosorbide .   Hyperlipidemia LDL 50 December 2024, at goal. -Continue atorvastatin .  Disposition: Return in 6 months or sooner as needed.          Signed, Jason Velazquez.  Jason Whitefield, DNP, NP-C

## 2023-08-21 DIAGNOSIS — I25118 Atherosclerotic heart disease of native coronary artery with other forms of angina pectoris: Secondary | ICD-10-CM | POA: Diagnosis not present

## 2023-08-21 DIAGNOSIS — R278 Other lack of coordination: Secondary | ICD-10-CM | POA: Diagnosis not present

## 2023-08-21 DIAGNOSIS — M6281 Muscle weakness (generalized): Secondary | ICD-10-CM | POA: Diagnosis not present

## 2023-08-21 DIAGNOSIS — I872 Venous insufficiency (chronic) (peripheral): Secondary | ICD-10-CM | POA: Diagnosis not present

## 2023-08-21 DIAGNOSIS — R41841 Cognitive communication deficit: Secondary | ICD-10-CM | POA: Diagnosis not present

## 2023-08-21 DIAGNOSIS — Z741 Need for assistance with personal care: Secondary | ICD-10-CM | POA: Diagnosis not present

## 2023-08-22 ENCOUNTER — Ambulatory Visit: Attending: Student | Admitting: Student

## 2023-08-22 ENCOUNTER — Encounter: Payer: Self-pay | Admitting: Student

## 2023-08-22 VITALS — BP 118/74 | HR 73 | Ht 66.0 in | Wt 187.4 lb

## 2023-08-22 DIAGNOSIS — Z741 Need for assistance with personal care: Secondary | ICD-10-CM | POA: Diagnosis not present

## 2023-08-22 DIAGNOSIS — I10 Essential (primary) hypertension: Secondary | ICD-10-CM | POA: Diagnosis not present

## 2023-08-22 DIAGNOSIS — I872 Venous insufficiency (chronic) (peripheral): Secondary | ICD-10-CM | POA: Diagnosis not present

## 2023-08-22 DIAGNOSIS — E785 Hyperlipidemia, unspecified: Secondary | ICD-10-CM | POA: Insufficient documentation

## 2023-08-22 DIAGNOSIS — I25118 Atherosclerotic heart disease of native coronary artery with other forms of angina pectoris: Secondary | ICD-10-CM | POA: Diagnosis not present

## 2023-08-22 DIAGNOSIS — M6281 Muscle weakness (generalized): Secondary | ICD-10-CM | POA: Diagnosis not present

## 2023-08-22 DIAGNOSIS — I25810 Atherosclerosis of coronary artery bypass graft(s) without angina pectoris: Secondary | ICD-10-CM | POA: Diagnosis not present

## 2023-08-22 DIAGNOSIS — R41841 Cognitive communication deficit: Secondary | ICD-10-CM | POA: Diagnosis not present

## 2023-08-22 DIAGNOSIS — I5032 Chronic diastolic (congestive) heart failure: Secondary | ICD-10-CM | POA: Insufficient documentation

## 2023-08-22 DIAGNOSIS — R278 Other lack of coordination: Secondary | ICD-10-CM | POA: Diagnosis not present

## 2023-08-22 NOTE — Patient Instructions (Addendum)
 Medication Instructions:  No changes at this time.   *If you need a refill on your cardiac medications before your next appointment, please call your pharmacy*  Lab Work: None  If you have labs (blood work) drawn today and your tests are completely normal, you will receive your results only by: MyChart Message (if you have MyChart) OR A paper copy in the mail If you have any lab test that is abnormal or we need to change your treatment, we will call you to review the results.  Testing/Procedures: None  Follow-Up: At [facility], you and your health needs are our priority.  As part of our continuing mission to provide you with exceptional heart care, our providers are all part of one team.  This team includes your primary Cardiologist (physician) and Advanced Practice Providers or APPs (Physician Assistants and Nurse Practitioners) who all work together to provide you with the care you need, when you need it.  Your next appointment:   6 month(s)  Provider:   Sammy Crisp, MD or Morey Ar, NP

## 2023-08-23 DIAGNOSIS — R41841 Cognitive communication deficit: Secondary | ICD-10-CM | POA: Diagnosis not present

## 2023-08-23 DIAGNOSIS — Z741 Need for assistance with personal care: Secondary | ICD-10-CM | POA: Diagnosis not present

## 2023-08-23 DIAGNOSIS — I872 Venous insufficiency (chronic) (peripheral): Secondary | ICD-10-CM | POA: Diagnosis not present

## 2023-08-23 DIAGNOSIS — I25118 Atherosclerotic heart disease of native coronary artery with other forms of angina pectoris: Secondary | ICD-10-CM | POA: Diagnosis not present

## 2023-08-23 DIAGNOSIS — R278 Other lack of coordination: Secondary | ICD-10-CM | POA: Diagnosis not present

## 2023-08-23 DIAGNOSIS — M6281 Muscle weakness (generalized): Secondary | ICD-10-CM | POA: Diagnosis not present

## 2023-08-25 DIAGNOSIS — R41841 Cognitive communication deficit: Secondary | ICD-10-CM | POA: Diagnosis not present

## 2023-08-25 DIAGNOSIS — M6281 Muscle weakness (generalized): Secondary | ICD-10-CM | POA: Diagnosis not present

## 2023-08-25 DIAGNOSIS — R278 Other lack of coordination: Secondary | ICD-10-CM | POA: Diagnosis not present

## 2023-08-25 DIAGNOSIS — I872 Venous insufficiency (chronic) (peripheral): Secondary | ICD-10-CM | POA: Diagnosis not present

## 2023-08-25 DIAGNOSIS — Z741 Need for assistance with personal care: Secondary | ICD-10-CM | POA: Diagnosis not present

## 2023-08-25 DIAGNOSIS — I25118 Atherosclerotic heart disease of native coronary artery with other forms of angina pectoris: Secondary | ICD-10-CM | POA: Diagnosis not present

## 2023-08-28 DIAGNOSIS — Z741 Need for assistance with personal care: Secondary | ICD-10-CM | POA: Diagnosis not present

## 2023-08-28 DIAGNOSIS — L853 Xerosis cutis: Secondary | ICD-10-CM | POA: Diagnosis not present

## 2023-08-28 DIAGNOSIS — L814 Other melanin hyperpigmentation: Secondary | ICD-10-CM | POA: Diagnosis not present

## 2023-08-28 DIAGNOSIS — L821 Other seborrheic keratosis: Secondary | ICD-10-CM | POA: Diagnosis not present

## 2023-08-28 DIAGNOSIS — L570 Actinic keratosis: Secondary | ICD-10-CM | POA: Diagnosis not present

## 2023-08-28 DIAGNOSIS — L918 Other hypertrophic disorders of the skin: Secondary | ICD-10-CM | POA: Diagnosis not present

## 2023-08-28 DIAGNOSIS — R41841 Cognitive communication deficit: Secondary | ICD-10-CM | POA: Diagnosis not present

## 2023-08-28 DIAGNOSIS — L905 Scar conditions and fibrosis of skin: Secondary | ICD-10-CM | POA: Diagnosis not present

## 2023-08-28 DIAGNOSIS — M6281 Muscle weakness (generalized): Secondary | ICD-10-CM | POA: Diagnosis not present

## 2023-08-28 DIAGNOSIS — I872 Venous insufficiency (chronic) (peripheral): Secondary | ICD-10-CM | POA: Diagnosis not present

## 2023-08-28 DIAGNOSIS — I25118 Atherosclerotic heart disease of native coronary artery with other forms of angina pectoris: Secondary | ICD-10-CM | POA: Diagnosis not present

## 2023-08-28 DIAGNOSIS — R278 Other lack of coordination: Secondary | ICD-10-CM | POA: Diagnosis not present

## 2023-09-01 DIAGNOSIS — R278 Other lack of coordination: Secondary | ICD-10-CM | POA: Diagnosis not present

## 2023-09-01 DIAGNOSIS — M6281 Muscle weakness (generalized): Secondary | ICD-10-CM | POA: Diagnosis not present

## 2023-09-01 DIAGNOSIS — I872 Venous insufficiency (chronic) (peripheral): Secondary | ICD-10-CM | POA: Diagnosis not present

## 2023-09-01 DIAGNOSIS — R41841 Cognitive communication deficit: Secondary | ICD-10-CM | POA: Diagnosis not present

## 2023-09-01 DIAGNOSIS — I25118 Atherosclerotic heart disease of native coronary artery with other forms of angina pectoris: Secondary | ICD-10-CM | POA: Diagnosis not present

## 2023-09-01 DIAGNOSIS — Z741 Need for assistance with personal care: Secondary | ICD-10-CM | POA: Diagnosis not present

## 2023-09-05 DIAGNOSIS — I25118 Atherosclerotic heart disease of native coronary artery with other forms of angina pectoris: Secondary | ICD-10-CM | POA: Diagnosis not present

## 2023-09-05 DIAGNOSIS — I872 Venous insufficiency (chronic) (peripheral): Secondary | ICD-10-CM | POA: Diagnosis not present

## 2023-09-05 DIAGNOSIS — R41841 Cognitive communication deficit: Secondary | ICD-10-CM | POA: Diagnosis not present

## 2023-09-05 DIAGNOSIS — M6281 Muscle weakness (generalized): Secondary | ICD-10-CM | POA: Diagnosis not present

## 2023-09-05 DIAGNOSIS — Z741 Need for assistance with personal care: Secondary | ICD-10-CM | POA: Diagnosis not present

## 2023-09-05 DIAGNOSIS — R278 Other lack of coordination: Secondary | ICD-10-CM | POA: Diagnosis not present

## 2023-09-08 DIAGNOSIS — I872 Venous insufficiency (chronic) (peripheral): Secondary | ICD-10-CM | POA: Diagnosis not present

## 2023-09-08 DIAGNOSIS — I25118 Atherosclerotic heart disease of native coronary artery with other forms of angina pectoris: Secondary | ICD-10-CM | POA: Diagnosis not present

## 2023-09-08 DIAGNOSIS — R41841 Cognitive communication deficit: Secondary | ICD-10-CM | POA: Diagnosis not present

## 2023-09-08 DIAGNOSIS — M6281 Muscle weakness (generalized): Secondary | ICD-10-CM | POA: Diagnosis not present

## 2023-09-08 DIAGNOSIS — R278 Other lack of coordination: Secondary | ICD-10-CM | POA: Diagnosis not present

## 2023-09-08 DIAGNOSIS — Z741 Need for assistance with personal care: Secondary | ICD-10-CM | POA: Diagnosis not present

## 2023-09-17 ENCOUNTER — Non-Acute Institutional Stay (SKILLED_NURSING_FACILITY): Payer: Self-pay | Admitting: Student

## 2023-09-17 ENCOUNTER — Encounter: Payer: Self-pay | Admitting: Student

## 2023-09-17 DIAGNOSIS — K439 Ventral hernia without obstruction or gangrene: Secondary | ICD-10-CM

## 2023-09-17 DIAGNOSIS — N401 Enlarged prostate with lower urinary tract symptoms: Secondary | ICD-10-CM | POA: Diagnosis not present

## 2023-09-17 DIAGNOSIS — I739 Peripheral vascular disease, unspecified: Secondary | ICD-10-CM | POA: Diagnosis not present

## 2023-09-17 DIAGNOSIS — I5032 Chronic diastolic (congestive) heart failure: Secondary | ICD-10-CM | POA: Diagnosis not present

## 2023-09-17 DIAGNOSIS — I25118 Atherosclerotic heart disease of native coronary artery with other forms of angina pectoris: Secondary | ICD-10-CM | POA: Diagnosis not present

## 2023-09-17 DIAGNOSIS — F03A18 Unspecified dementia, mild, with other behavioral disturbance: Secondary | ICD-10-CM | POA: Diagnosis not present

## 2023-09-17 DIAGNOSIS — I700 Atherosclerosis of aorta: Secondary | ICD-10-CM

## 2023-09-17 NOTE — Progress Notes (Signed)
 Location:  Other [facility] [place].  Nursing Home Room Number: [facility] [id number] Place of Service:  SNF [phone]) Provider:  Abdul Fine, MD  Patient Care Team: Abdul Fine, MD as PCP - General (Family Medicine) End, Lonni, MD as PCP - Cardiology (Cardiology) Mittie Gaskin, MD as Referring Physician (Ophthalmology) Caro Harlene POUR, NP as Nurse Practitioner (Geriatric Medicine)  Extended Emergency Contact Information Primary Emergency Contact: Hebner,Rebecca  [place] Mobile Phone: [phone] Relation: Daughter  Code Status:  DNR Goals of care: Advanced Directive information    09/17/2023    9:18 AM  Advanced Directives  Does Patient Have a Medical Advance Directive? Yes  Type of Advance Directive Out of facility DNR (pink MOST or yellow form)  Does patient want to make changes to medical advance directive? No - Patient declined     Chief Complaint  Patient presents with   Medical Management of Chronic Issues    Medical Management of Chronic Issues.     HPI:  Pt is a 89 y.o. male seen today for medical management of chronic diseases.   History of Present Illness The patient, with atrial fibrillation, coronary artery disease, heart failure, and dementia, presents for a routine follow-up.  No issues with urination or bowel movements. No chest pain or shortness of breath since arrival at the facility.  He has a history of wearing compression stockings prior to moving to the current facility but has not worn them recently.  He participates in morning exercises most days and enjoys spending time on the patio. He attends Bible study sessions regularly. His appetite is good, and he reports stable weight, monitoring it almost every morning.  Social History - Patient participates in exercises most mornings, enjoys sitting on the patio, and attends Bible study.  Past Medical History:  Diagnosis Date   Allergic rhinitis    Benign prostatic  hypertrophy    CAD (coronary artery disease)    a. 1998 CABG x 4(LIMA-LAD, VG-D1-D2, VG-RPL); b. 2000 & 2006 PCI of the LCX/OM2; c. 03/2018 Cath: LM 25d, LAD 100ost/m, 100m/d, D2 100, fills via collats from D3, LCX 90ost/m ISR w/ evidence of stent fx, 55m, OM2 30 ISR, 80/80, RCA 70p, RPDA 60ost, 70, RPAV 100, VG->D1->D2 100p (culprit), LIMA->LAD nl, VG->RPL2 nl. EF 45%-->Med Rx; d. 03/2021 MV: ant/antsept/apical inf/inflat isch, EF 75%.   Duodenal diverticulum 02/11/2001   GERD (gastroesophageal reflux disease)    H/O hiatal hernia 02/11/2001   Heart murmur    a. 03/2018 Echo: EF 55-60%, DD, mild anterolateral HK.  Mild-mod TR. Mild Ca2+ of AoV and MV.   Hepatic lesion 02/11/2002   History of melanoma excision    FACE   Hypercholesteremia    Hypertension    Left carotid stenosis    > 50%  PER DUPLEX  03-28-2011   Mild mitral regurgitation    Perianal fistula    S/P CABG x 4    1988   S/P drug eluting coronary stent placement    POST CABG--  STENTING 2000  AND RESTENTING 2006 FOR IN-STENT STENOSIS   Stroke [facility])    Past Surgical History:  Procedure Laterality Date   CATARACT EXTRACTION W/ INTRAOCULAR LENS  IMPLANT, BILATERAL     CORONARY ANGIOPLASTY WITH STENT PLACEMENT  2000   PCI AND STENTING CIRCUMFLEX   CORONARY ANGIOPLASTY WITH STENT PLACEMENT  04-30-2004  DR VICTORY SHARPS   RE-INSTENT STENOSIS/  DRUG-ELUTING STENT OF PROXIMAL AND MID CIRCUMFLEX/ PCI MID CIRCUMFLEX THROUGH STENT STRUT/  WIDELY  PATENT SAPHENOUS VEIN GRAFT AND  LIMA TO LAD GRAFT/ TOTAL OCCLUSION RIGHT CORONARY BEYOND THE POSTERIOR DESCENDING ARTERY BRANCH WITH 50% OSTIAL NARROWING/ TOTAL OCCLUSION OF LAD AT THE OSTIUM OF THE LEFT MAIN   CORONARY ARTERY BYPASS GRAFT  1988   X5   EVALUATION UNDER ANESTHESIA WITH ANAL FISTULECTOMY N/A 07/22/2012   Procedure: EXAM UNDER ANESTHESIA WITH ANAL fistulotomy;  Surgeon: Bernarda Ned, MD;  Location: [facility];  Service: General;  Laterality: N/A;   LEFT HEART CATH  AND CORS/GRAFTS ANGIOGRAPHY N/A 03/31/2018   Procedure: LEFT HEART CATH AND CORS/GRAFTS ANGIOGRAPHY;  Surgeon: Mady Bruckner, MD;  Location: [facility] INVASIVE CV LAB;  Service: Cardiovascular;  Laterality: N/A;   SHOULDER ARTHROSCOPY WITH OPEN ROTATOR CUFF REPAIR Right 07-10-1999   TONSILLECTOMY AND ADENOIDECTOMY  1944   TRANSTHORACIC ECHOCARDIOGRAM  03-01-2011  DR VICTORY SHARPS   NORMAL LVF AND LV SIZE/ MILD LEFT ATRIAL ENLARGEMENT/ GRADE II DIASTOLIC DYSFUNCTION WITH ELEVATED LEFT ATRIAL PRESSURE/ MILD TO MODERATE MR/ MILD TR   TRANSURETHRAL RESECTION OF PROSTATE  04/09/2011   Procedure: TRANSURETHRAL RESECTION OF THE PROSTATE WITH GYRUS INSTRUMENTS;  Surgeon: Norleen JINNY Seltzer, MD;  Location: [facility];  Service: Urology;  Laterality: N/A;    Allergies  Allergen Reactions   Tape Rash    ONLY USE PAPER TAPE ONLY USE PAPER TAPE   Iodine Rash    Rash when applied to skin   Niacin  And Related Hives and Other (See Comments)    FLUSHING itching    Outpatient Encounter Medications as of 09/17/2023  Medication Sig   acetaminophen  (TYLENOL ) 500 MG tablet Take 1,000 mg by mouth 2 (two) times daily.   amLODipine  (NORVASC ) 2.5 MG tablet Take 1 tablet (2.5 mg total) by mouth daily.   atorvastatin  (LIPITOR) 80 MG tablet Take one tablet nightly for high cholesterol   clopidogrel  (PLAVIX ) 75 MG tablet Take 75 mg by mouth daily.   cyanocobalamin  (,VITAMIN B-12,) 1000 MCG/ML injection Inject 1,000 mcg into the muscle every 30 (thirty) days.   dapagliflozin propanediol (FARXIGA) 10 MG TABS tablet Take 10 mg by mouth daily.   finasteride (PROSCAR) 5 MG tablet Take 5 mg by mouth daily.   furosemide  (LASIX ) 80 MG tablet Take 80 mg by mouth daily. Give 80mg  by mouth every 24 hours as needed for weight gain of 3lbs overnight or 5lbs in 1 week.   isosorbide  mononitrate (IMDUR ) 30 MG 24 hr tablet Take 30 mg by mouth at bedtime.   isosorbide  mononitrate (IMDUR ) 60 MG 24 hr tablet Take 60 mg by mouth daily after breakfast.    metoprolol  succinate (TOPROL -XL) 25 MG 24 hr tablet Take 25 mg by mouth every morning.   nitroGLYCERIN  (NITROSTAT ) 0.4 MG SL tablet PLACE 1 TABLET (0.4 MG TOTAL) UNDER THE TONGUE EVERY 5 (FIVE) MINUTES AS NEEDED FOR CHEST PAIN.   pantoprazole  (PROTONIX ) 40 MG tablet Take 1 tablet (40 mg total) by mouth 2 (two) times daily for 2 days.   polyethylene glycol (MIRALAX  / GLYCOLAX ) packet Take 17 g by mouth daily.   potassium chloride  SA (KLOR-CON  M) 20 MEQ tablet TAKE 1 TABLET BY MOUTH ONCE DAILY  *TAKE WITH FOOD* *DO NOT CRUSH OR CHEW* *MAY DISSOLVE*   tamsulosin  (FLOMAX ) 0.4 MG CAPS capsule Take 0.4 mg by mouth daily.   acetaminophen  (TYLENOL ) 325 MG tablet Take 650 mg by mouth every 4 (four) hours as needed. (Patient not taking: Reported on 09/17/2023)   cetirizine (ZYRTEC) 10 MG chewable tablet Chew 10 mg by  mouth as needed for allergies. (Patient not taking: Reported on 09/17/2023)   clobetasol cream (TEMOVATE) 0.05 % Apply topically 2 (two) times daily. (Patient not taking: Reported on 09/17/2023)   Emollient (EUCERIN EX) Apply to BLE and trunk topically two times daily (Patient not taking: Reported on 09/17/2023)   Glucose 15 GM/32ML GEL Take 1 packet by mouth as needed. (Patient not taking: Reported on 09/17/2023)   hydrocortisone 2.5 % cream Apply 1 Application topically 2 (two) times daily as needed. (Patient not taking: Reported on 09/17/2023)   ketoconazole  (NIZORAL ) 2 % cream Apply 1 application topically 2 (two) times daily. (Patient not taking: Reported on 09/17/2023)   Loteprednol Etabonate 0.2 % EMUL Apply 1 drop to eye as needed. Right Eye (Patient not taking: Reported on 09/17/2023)   [DISCONTINUED] potassium chloride  (KLOR-CON ) 20 MEQ packet Take 20 mEq by mouth daily.   No facility-administered encounter medications on file as of 09/17/2023.    Review of Systems  Immunization History  Administered Date(s) Administered   Influenza, High Dose Seasonal PF 12/13/2013, 12/27/2014, 11/02/2015,  11/11/2017   Influenza-Unspecified 11/11/2016, 11/28/2020, 11/28/2021, 11/29/2022   Moderna Covid-19 Fall Seasonal Vaccine 28yrs & older 05/21/2022, 12/26/2022   Moderna Covid-19 Vaccine  Bivalent Booster 67yrs & up 12/21/2021   Moderna Sars-Covid-2 Vaccination 02/22/2019, 03/22/2019, 12/24/2019   Pneumococcal Conjugate-13 03/14/2014   Pneumococcal Polysaccharide-23 02/11/2013   Tdap 09/19/2015   Unspecified SARS-COV-2 Vaccination 12/24/2019, 06/30/2020, 11/03/2020, 07/10/2021, 05/23/2023   Zoster Recombinant(Shingrix) 06/12/2022, 10/11/2022   Zoster, Live 02/11/2013   Pertinent  Health Maintenance Due  Topic Date Due   INFLUENZA VACCINE  09/12/2023      04/01/2018    8:00 PM 04/13/2018   12:58 PM 03/10/2019    7:17 PM 12/18/2020    2:40 AM 06/19/2023    1:56 PM  Fall Risk  Falls in the past year?  0  0   1  (RETIRED) Patient Fall Risk Level Moderate fall risk    Low fall risk    Patient at Risk for Falls Due to     History of fall(s);Impaired balance/gait;Impaired mobility     Data saved with a previous flowsheet row definition   Functional Status Survey:    Vitals:   09/17/23 0911 09/17/23 0919  BP: (!) 161/85 (!) 150/96  Pulse: 79   Resp: 15   Temp: 97.7 F (36.5 C)   SpO2: 95%   Weight: 189 lb 3.2 oz (85.8 kg)   Height: 5' 6 (1.676 m)    Body mass index is 30.54 kg/m. Physical Exam CHEST: Lungs clear to auscultation bilaterally. ABDOMEN: Hernia present, soft. EXTREMITIES: Mild swelling in legs. Labs reviewed: Recent Labs    10/30/22 0729 01/16/23 0000 04/14/23 0000 05/12/23 0000 07/28/23 0000  NA 134*   < > 126* 132* 132*  K 4.2   < > 3.7 4.1 3.9  CL 100   < > 90* 98* 94*  CO2 26   < > 28* 27* 30*  GLUCOSE 103*  --   --   --   --   BUN 10   < > 12 10 7   CREATININE 1.05   < > 1.3 0.9 0.9  CALCIUM  8.6*   < > 8.1* 8.4* 9.1   < > = values in this interval not displayed.   Recent Labs    01/16/23 0000  AST 16  ALT 12  ALBUMIN 3.9   Recent Labs     10/30/22 0729 01/16/23 0000 04/10/23 0000 05/12/23 0000 07/28/23  0000  WBC 5.9   < > 7.5 5.7 7.5  NEUTROABS  --    < > 5,415.00 2,998.00 5,445.00  HGB 11.2*   < > 8.9* 10.5* 12.0*  HCT 35.2*   < > 27* 33* 37*  MCV 85.6  --   --   --   --   PLT 261   < > 232 248 264   < > = values in this interval not displayed.   Lab Results  Component Value Date   TSH 5.46 01/16/2023   Lab Results  Component Value Date   HGBA1C 5.8 (H) 05/27/2013   Lab Results  Component Value Date   CHOL 106 01/16/2023   HDL 39 01/16/2023   LDLCALC 50 01/16/2023   TRIG 85 01/16/2023   CHOLHDL 3.2 10/11/2021    Significant Diagnostic Results in last 30 days:  No results found.  Assessment/Plan Heart failure Heart failure is well-managed with no chest pain or dyspnea since admission. Fluid status is stable with no significant weight changes.  Atrial fibrillation Atrial fibrillation was not addressed in terms of symptoms or management changes during this encounter.  Coronary artery disease Coronary artery disease is well-managed with no chest pain or dyspnea since admission.  Dementia He engages in activities such as exercise, Bible study, and social events, indicating good cognitive engagement.  Ventral hernia Ventral hernia is present but remains soft, indicating no acute complications such as incarceration or strangulation.  Lower extremity edema Mild lower extremity edema is present. He has not worn compression stockings recently. - Encourage wearing compression stockings with assistance from staff or family. Family/ staff Communication: nursing  Labs/tests ordered:  none

## 2023-09-25 ENCOUNTER — Encounter: Payer: Self-pay | Admitting: Student

## 2023-10-02 DIAGNOSIS — L570 Actinic keratosis: Secondary | ICD-10-CM | POA: Diagnosis not present

## 2023-10-03 DIAGNOSIS — B351 Tinea unguium: Secondary | ICD-10-CM | POA: Diagnosis not present

## 2023-10-03 DIAGNOSIS — I7091 Generalized atherosclerosis: Secondary | ICD-10-CM | POA: Diagnosis not present

## 2023-10-09 LAB — BASIC METABOLIC PANEL WITH GFR
BUN: 9 (ref 4–21)
CO2: 31 — AB (ref 13–22)
Chloride: 98 — AB (ref 99–108)
Creatinine: 1.1 (ref 0.6–1.3)
Glucose: 89
Potassium: 4.2 meq/L (ref 3.5–5.1)
Sodium: 135 — AB (ref 137–147)

## 2023-10-09 LAB — COMPREHENSIVE METABOLIC PANEL WITH GFR: Calcium: 9 (ref 8.7–10.7)

## 2023-10-23 ENCOUNTER — Encounter: Payer: Self-pay | Admitting: Nurse Practitioner

## 2023-10-23 ENCOUNTER — Non-Acute Institutional Stay (SKILLED_NURSING_FACILITY): Payer: Self-pay | Admitting: Nurse Practitioner

## 2023-10-23 DIAGNOSIS — F03A18 Unspecified dementia, mild, with other behavioral disturbance: Secondary | ICD-10-CM

## 2023-10-23 DIAGNOSIS — N401 Enlarged prostate with lower urinary tract symptoms: Secondary | ICD-10-CM

## 2023-10-23 DIAGNOSIS — R351 Nocturia: Secondary | ICD-10-CM

## 2023-10-23 DIAGNOSIS — I10 Essential (primary) hypertension: Secondary | ICD-10-CM

## 2023-10-23 DIAGNOSIS — I5032 Chronic diastolic (congestive) heart failure: Secondary | ICD-10-CM

## 2023-10-23 DIAGNOSIS — K219 Gastro-esophageal reflux disease without esophagitis: Secondary | ICD-10-CM | POA: Diagnosis not present

## 2023-10-23 DIAGNOSIS — I25118 Atherosclerotic heart disease of native coronary artery with other forms of angina pectoris: Secondary | ICD-10-CM

## 2023-10-23 NOTE — Progress Notes (Signed)
 Location:  Other [place].  Nursing Home Room Number: [facility] [id number] Place of Service:  SNF [phone]) Harlene An, NP  PCP: Abdul Fine, MD  Patient Care Team: Abdul Fine, MD as PCP - General (Family Medicine) End, Lonni, MD as PCP - Cardiology (Cardiology) Mittie Gaskin, MD as Referring Physician (Ophthalmology) An Harlene POUR, NP as Nurse Practitioner (Geriatric Medicine)  Extended Emergency Contact Information Primary Emergency Contact: Hebner,Rebecca  [place] Mobile Phone: [phone] Relation: Daughter  Goals of care: Advanced Directive information    09/17/2023    9:18 AM  Advanced Directives  Does Patient Have a Medical Advance Directive? Yes  Type of Advance Directive Out of facility DNR (pink MOST or yellow form)  Does patient want to make changes to medical advance directive? No - Patient declined     Chief Complaint  Patient presents with   Medical Management of Chronic Issues    Medical Management of Chronic Issues.     HPI:  Pt is a 89 y.o. male seen today for medical management of chronic disease. Pt with hx of CHF, HTN, GERD, constipation, BPH.  He reports he is doing well. Has not has any acute issues or concerns. Reports he is eating well.  Denies pain. No reports of shortness of breath, chest pain, worsening LE edema Noted to have chronic LE edema however does not wish to elevate legs and does not like compression hoses. Reports swelling has not worsened.  He is followed by cardiology every 6 months.     Past Medical History:  Diagnosis Date   Allergic rhinitis    Benign prostatic hypertrophy    CAD (coronary artery disease)    a. 1998 CABG x 4(LIMA-LAD, VG-D1-D2, VG-RPL); b. 2000 & 2006 PCI of the LCX/OM2; c. 03/2018 Cath: LM 25d, LAD 100ost/m, 85m/d, D2 100, fills via collats from D3, LCX 90ost/m ISR w/ evidence of stent fx, 39m, OM2 30 ISR, 80/80, RCA 70p, RPDA 60ost, 70, RPAV 100, VG->D1->D2  100p (culprit), LIMA->LAD nl, VG->RPL2 nl. EF 45%-->Med Rx; d. 03/2021 MV: ant/antsept/apical inf/inflat isch, EF 75%.   Duodenal diverticulum 02/11/2001   GERD (gastroesophageal reflux disease)    H/O hiatal hernia 02/11/2001   Heart murmur    a. 03/2018 Echo: EF 55-60%, DD, mild anterolateral HK.  Mild-mod TR. Mild Ca2+ of AoV and MV.   Hepatic lesion 02/11/2002   History of melanoma excision    FACE   Hypercholesteremia    Hypertension    Left carotid stenosis    > 50%  PER DUPLEX  03-28-2011   Mild mitral regurgitation    Perianal fistula    S/P CABG x 4    1988   S/P drug eluting coronary stent placement    POST CABG--  STENTING 2000  AND RESTENTING 2006 FOR IN-STENT STENOSIS   Stroke [facility])    Past Surgical History:  Procedure Laterality Date   CATARACT EXTRACTION W/ INTRAOCULAR LENS  IMPLANT, BILATERAL     CORONARY ANGIOPLASTY WITH STENT PLACEMENT  2000   PCI AND STENTING CIRCUMFLEX   CORONARY ANGIOPLASTY WITH STENT PLACEMENT  04-30-2004  DR VICTORY SHARPS   RE-INSTENT STENOSIS/  DRUG-ELUTING STENT OF PROXIMAL AND MID CIRCUMFLEX/ PCI MID CIRCUMFLEX THROUGH STENT STRUT/  WIDELY PATENT SAPHENOUS VEIN GRAFT AND  LIMA TO LAD GRAFT/ TOTAL OCCLUSION RIGHT CORONARY BEYOND THE POSTERIOR DESCENDING ARTERY BRANCH WITH 50% OSTIAL NARROWING/ TOTAL OCCLUSION OF LAD AT THE OSTIUM OF THE LEFT MAIN   CORONARY ARTERY BYPASS GRAFT  1988   X5   EVALUATION UNDER ANESTHESIA WITH ANAL FISTULECTOMY N/A 07/22/2012   Procedure: EXAM UNDER ANESTHESIA WITH ANAL fistulotomy;  Surgeon: Bernarda Ned, MD;  Location: [facility];  Service: General;  Laterality: N/A;   LEFT HEART CATH AND CORS/GRAFTS ANGIOGRAPHY N/A 03/31/2018   Procedure: LEFT HEART CATH AND CORS/GRAFTS ANGIOGRAPHY;  Surgeon: Mady Bruckner, MD;  Location: [facility] INVASIVE CV LAB;  Service: Cardiovascular;  Laterality: N/A;   SHOULDER ARTHROSCOPY WITH OPEN ROTATOR CUFF REPAIR Right 07-10-1999   TONSILLECTOMY AND ADENOIDECTOMY  1944    TRANSTHORACIC ECHOCARDIOGRAM  03-01-2011  DR VICTORY SHARPS   NORMAL LVF AND LV SIZE/ MILD LEFT ATRIAL ENLARGEMENT/ GRADE II DIASTOLIC DYSFUNCTION WITH ELEVATED LEFT ATRIAL PRESSURE/ MILD TO MODERATE MR/ MILD TR   TRANSURETHRAL RESECTION OF PROSTATE  04/09/2011   Procedure: TRANSURETHRAL RESECTION OF THE PROSTATE WITH GYRUS INSTRUMENTS;  Surgeon: Norleen JINNY Seltzer, MD;  Location: [facility];  Service: Urology;  Laterality: N/A;    Allergies  Allergen Reactions   Tape Rash    ONLY USE PAPER TAPE ONLY USE PAPER TAPE   Iodine Rash    Rash when applied to skin   Niacin  And Related Hives and Other (See Comments)    FLUSHING itching    Outpatient Encounter Medications as of 10/23/2023  Medication Sig   acetaminophen  (TYLENOL ) 500 MG tablet Take 1,000 mg by mouth 2 (two) times daily.   amLODipine  (NORVASC ) 2.5 MG tablet Take 1 tablet (2.5 mg total) by mouth daily.   atorvastatin  (LIPITOR) 80 MG tablet Take one tablet nightly for high cholesterol   clopidogrel  (PLAVIX ) 75 MG tablet Take 75 mg by mouth daily.   cyanocobalamin  (,VITAMIN B-12,) 1000 MCG/ML injection Inject 1,000 mcg into the muscle every 30 (thirty) days.   dapagliflozin propanediol (FARXIGA) 10 MG TABS tablet Take 10 mg by mouth daily.   finasteride (PROSCAR) 5 MG tablet Take 5 mg by mouth daily.   furosemide  (LASIX ) 80 MG tablet Take 80 mg by mouth daily. Give 80mg  by mouth every 24 hours as needed for weight gain of 3lbs overnight or 5lbs in 1 week.   isosorbide  mononitrate (IMDUR ) 30 MG 24 hr tablet Take 30 mg by mouth at bedtime.   isosorbide  mononitrate (IMDUR ) 60 MG 24 hr tablet Take 60 mg by mouth daily after breakfast.   metoprolol  succinate (TOPROL -XL) 25 MG 24 hr tablet Take 25 mg by mouth every morning.   nitroGLYCERIN  (NITROSTAT ) 0.4 MG SL tablet PLACE 1 TABLET (0.4 MG TOTAL) UNDER THE TONGUE EVERY 5 (FIVE) MINUTES AS NEEDED FOR CHEST PAIN.   pantoprazole  (PROTONIX ) 40 MG tablet Take 1 tablet (40 mg total) by mouth 2 (two)  times daily for 2 days.   polyethylene glycol (MIRALAX  / GLYCOLAX ) packet Take 17 g by mouth daily.   potassium chloride  SA (KLOR-CON  M) 20 MEQ tablet TAKE 1 TABLET BY MOUTH ONCE DAILY  *TAKE WITH FOOD* *DO NOT CRUSH OR CHEW* *MAY DISSOLVE*   tamsulosin  (FLOMAX ) 0.4 MG CAPS capsule Take 0.4 mg by mouth daily.   acetaminophen  (TYLENOL ) 325 MG tablet Take 650 mg by mouth every 4 (four) hours as needed. (Patient not taking: Reported on 10/23/2023)   cetirizine (ZYRTEC) 10 MG chewable tablet Chew 10 mg by mouth as needed for allergies. (Patient not taking: Reported on 10/23/2023)   clobetasol cream (TEMOVATE) 0.05 % Apply topically 2 (two) times daily. (Patient not taking: Reported on 10/23/2023)   Emollient (EUCERIN EX) Apply to BLE and trunk topically two  times daily (Patient not taking: Reported on 10/23/2023)   Glucose 15 GM/32ML GEL Take 1 packet by mouth as needed. (Patient not taking: Reported on 10/23/2023)   hydrocortisone 2.5 % cream Apply 1 Application topically 2 (two) times daily as needed. (Patient not taking: Reported on 10/23/2023)   ketoconazole  (NIZORAL ) 2 % cream Apply 1 application topically 2 (two) times daily. (Patient not taking: Reported on 10/23/2023)   Loteprednol Etabonate 0.2 % EMUL Apply 1 drop to eye as needed. Right Eye (Patient not taking: Reported on 10/23/2023)   No facility-administered encounter medications on file as of 10/23/2023.    Review of Systems  Constitutional:  Negative for activity change, appetite change, fatigue and unexpected weight change.  HENT:  Negative for congestion and hearing loss.   Eyes: Negative.   Respiratory:  Negative for cough and shortness of breath.   Cardiovascular:  Positive for leg swelling. Negative for chest pain and palpitations.  Gastrointestinal:  Negative for abdominal pain, constipation and diarrhea.  Genitourinary:  Negative for difficulty urinating and dysuria.  Musculoskeletal:  Negative for arthralgias and myalgias.  Skin:   Negative for color change and wound.  Neurological:  Negative for dizziness and weakness.  Psychiatric/Behavioral:  Negative for agitation, behavioral problems and confusion.      Immunization History  Administered Date(s) Administered   INFLUENZA, HIGH DOSE SEASONAL PF 12/13/2013, 12/27/2014, 11/02/2015, 11/11/2017   Influenza-Unspecified 11/11/2016, 11/28/2020, 11/28/2021, 11/29/2022   Moderna Covid-19 Fall Seasonal Vaccine 48yrs & older 05/21/2022, 12/26/2022   Moderna Covid-19 Vaccine  Bivalent Booster 59yrs & up 12/21/2021   Moderna Sars-Covid-2 Vaccination 02/22/2019, 03/22/2019, 12/24/2019   Pneumococcal Conjugate-13 03/14/2014   Pneumococcal Polysaccharide-23 02/11/2013   Tdap 09/19/2015   Unspecified SARS-COV-2 Vaccination 12/24/2019, 06/30/2020, 11/03/2020, 07/10/2021, 05/23/2023   Zoster Recombinant(Shingrix) 06/12/2022, 10/11/2022   Zoster, Live 02/11/2013   Pertinent  Health Maintenance Due  Topic Date Due   Influenza Vaccine  09/12/2023      04/01/2018    8:00 PM 04/13/2018   12:58 PM 03/10/2019    7:17 PM 12/18/2020    2:40 AM 06/19/2023    1:56 PM  Fall Risk  Falls in the past year?  0  0   1  (RETIRED) Patient Fall Risk Level Moderate fall risk    Low fall risk    Patient at Risk for Falls Due to     History of fall(s);Impaired balance/gait;Impaired mobility     Data saved with a previous flowsheet row definition   Functional Status Survey:    Vitals:   10/23/23 0902 10/23/23 0918  BP: (!) 145/88 133/76  Pulse: 78   Resp: 18   Temp: 98.1 F (36.7 C)   SpO2: 97%   Weight: 184 lb 9.6 oz (83.7 kg)   Height: 5' 6 (1.676 m)    Body mass index is 29.8 kg/m. Physical Exam Constitutional:      General: He is not in acute distress.    Appearance: He is well-developed. He is not diaphoretic.  HENT:     Head: Normocephalic and atraumatic.     Right Ear: External ear normal.     Left Ear: External ear normal.     Mouth/Throat:     Pharynx: No oropharyngeal  exudate.  Eyes:     Conjunctiva/sclera: Conjunctivae normal.     Pupils: Pupils are equal, round, and reactive to light.  Cardiovascular:     Rate and Rhythm: Normal rate and regular rhythm.     Heart sounds: Normal heart sounds.  Pulmonary:     Effort: Pulmonary effort is normal.     Breath sounds: Normal breath sounds.  Abdominal:     General: Bowel sounds are normal.     Palpations: Abdomen is soft.  Musculoskeletal:        General: No tenderness.     Cervical back: Normal range of motion and neck supple.     Right lower leg: No edema.     Left lower leg: No edema.  Skin:    General: Skin is warm and dry.  Neurological:     Mental Status: He is alert and oriented to person, place, and time.     Labs reviewed: Recent Labs    10/30/22 0729 01/16/23 0000 05/12/23 0000 07/28/23 0000 10/09/23 0000  NA 134*   < > 132* 132* 135*  K 4.2   < > 4.1 3.9 4.2  CL 100   < > 98* 94* 98*  CO2 26   < > 27* 30* 31*  GLUCOSE 103*  --   --   --   --   BUN 10   < > 10 7 9   CREATININE 1.05   < > 0.9 0.9 1.1  CALCIUM  8.6*   < > 8.4* 9.1 9.0   < > = values in this interval not displayed.   Recent Labs    01/16/23 0000  AST 16  ALT 12  ALBUMIN 3.9   Recent Labs    10/30/22 0729 01/16/23 0000 04/10/23 0000 05/12/23 0000 07/28/23 0000  WBC 5.9   < > 7.5 5.7 7.5  NEUTROABS  --    < > 5,415.00 2,998.00 5,445.00  HGB 11.2*   < > 8.9* 10.5* 12.0*  HCT 35.2*   < > 27* 33* 37*  MCV 85.6  --   --   --   --   PLT 261   < > 232 248 264   < > = values in this interval not displayed.   Lab Results  Component Value Date   TSH 5.46 01/16/2023   Lab Results  Component Value Date   HGBA1C 5.8 (H) 05/27/2013   Lab Results  Component Value Date   CHOL 106 01/16/2023   HDL 39 01/16/2023   LDLCALC 50 01/16/2023   TRIG 85 01/16/2023   CHOLHDL 3.2 10/11/2021    Significant Diagnostic Results in last 30 days:  No results found.  Assessment/Plan BPH (benign prostatic  hyperplasia) Stable, continue on the tamsulosin  0.4mg  daily and proscar.   Chronic diastolic heart failure (HCC) Euvolemic, continues on metoprolol , farxiga, and lasix . Has LE edema but this has been stable.   Coronary artery disease of native artery of native heart with stable angina pectoris (HCC) Stable, without recent chest pains, continues on IMDUR  BID, metoprolol , lipitor and plavix . Followed by cardiology   Essential hypertension Blood pressure well controlled, goal bp <140/90 Continue current medications and dietary modifications follow metabolic panel   GERD (gastroesophageal reflux disease) Controlled on protonix    Mild dementia (HCC) Stable, no acute changes in cognitive or functional status, continue supportive care.      Camyra Vaeth K. Caro BODILY [facility] [phone]

## 2023-10-28 ENCOUNTER — Non-Acute Institutional Stay (SKILLED_NURSING_FACILITY): Payer: Self-pay | Admitting: Nurse Practitioner

## 2023-10-28 ENCOUNTER — Encounter: Payer: Self-pay | Admitting: Nurse Practitioner

## 2023-10-28 DIAGNOSIS — Z961 Presence of intraocular lens: Secondary | ICD-10-CM | POA: Diagnosis not present

## 2023-10-28 DIAGNOSIS — D3131 Benign neoplasm of right choroid: Secondary | ICD-10-CM | POA: Diagnosis not present

## 2023-10-28 DIAGNOSIS — I10 Essential (primary) hypertension: Secondary | ICD-10-CM

## 2023-10-28 DIAGNOSIS — I25118 Atherosclerotic heart disease of native coronary artery with other forms of angina pectoris: Secondary | ICD-10-CM | POA: Diagnosis not present

## 2023-10-28 DIAGNOSIS — H43812 Vitreous degeneration, left eye: Secondary | ICD-10-CM | POA: Diagnosis not present

## 2023-10-28 NOTE — Progress Notes (Deleted)
 Location:  Other [place].  Nursing Home Room Number: [facility] [id number] Place of Service:  SNF [phone]) Jason An, NP  PCP: Abdul Fine, MD  Patient Care Team: Abdul Fine, MD as PCP - General (Family Medicine) End, Lonni, MD as PCP - Cardiology (Cardiology) Mittie Gaskin, MD as Referring Physician (Ophthalmology) Velazquez Jason POUR, NP as Nurse Practitioner (Geriatric Medicine)  Extended Emergency Contact Information Primary Emergency Contact: Hebner,Rebecca  [place] Mobile Phone: [phone] Relation: Daughter  Goals of care: Advanced Directive information    09/17/2023    9:18 AM  Advanced Directives  Does Patient Have a Medical Advance Directive? Yes  Type of Advance Directive Out of facility DNR (pink MOST or yellow form)  Does patient want to make changes to medical advance directive? No - Patient declined     Chief Complaint  Patient presents with   Hypertension    Hypertension and Dizziness.     HPI:  Jason Velazquez is a 89 y.o. male seen today for Velazquez acute visit for Hypertension and Dizziness.    Past Medical History:  Diagnosis Date   Allergic rhinitis    Benign prostatic hypertrophy    CAD (coronary artery disease)    a. 1998 CABG x 4(LIMA-LAD, VG-D1-D2, VG-RPL); b. 2000 & 2006 PCI of the LCX/OM2; c. 03/2018 Cath: LM 25d, LAD 100ost/m, 60m/d, D2 100, fills via collats from D3, LCX 90ost/m ISR w/ evidence of stent fx, 30m, OM2 30 ISR, 80/80, RCA 70p, RPDA 60ost, 70, RPAV 100, VG->D1->D2 100p (culprit), LIMA->LAD nl, VG->RPL2 nl. EF 45%-->Med Rx; d. 03/2021 MV: ant/antsept/apical inf/inflat isch, EF 75%.   Duodenal diverticulum 02/11/2001   GERD (gastroesophageal reflux disease)    H/O hiatal hernia 02/11/2001   Heart murmur    a. 03/2018 Echo: EF 55-60%, DD, mild anterolateral HK.  Mild-mod TR. Mild Ca2+ of AoV and MV.   Hepatic lesion 02/11/2002   History of melanoma excision    FACE   Hypercholesteremia    Hypertension     Left carotid stenosis    > 50%  PER DUPLEX  03-28-2011   Mild mitral regurgitation    Perianal fistula    S/P CABG x 4    1988   S/P drug eluting coronary stent placement    POST CABG--  STENTING 2000  AND RESTENTING 2006 FOR IN-STENT STENOSIS   Stroke [facility])    Past Surgical History:  Procedure Laterality Date   CATARACT EXTRACTION W/ INTRAOCULAR LENS  IMPLANT, BILATERAL     CORONARY ANGIOPLASTY WITH STENT PLACEMENT  2000   PCI AND STENTING CIRCUMFLEX   CORONARY ANGIOPLASTY WITH STENT PLACEMENT  04-30-2004  DR VICTORY SHARPS   RE-INSTENT STENOSIS/  DRUG-ELUTING STENT OF PROXIMAL AND MID CIRCUMFLEX/ PCI MID CIRCUMFLEX THROUGH STENT STRUT/  WIDELY PATENT SAPHENOUS VEIN GRAFT AND  LIMA TO LAD GRAFT/ TOTAL OCCLUSION RIGHT CORONARY BEYOND THE POSTERIOR DESCENDING ARTERY BRANCH WITH 50% OSTIAL NARROWING/ TOTAL OCCLUSION OF LAD AT THE OSTIUM OF THE LEFT MAIN   CORONARY ARTERY BYPASS GRAFT  1988   X5   EVALUATION UNDER ANESTHESIA WITH ANAL FISTULECTOMY N/A 07/22/2012   Procedure: EXAM UNDER ANESTHESIA WITH ANAL fistulotomy;  Surgeon: Bernarda Ned, MD;  Location: [facility];  Service: General;  Laterality: N/A;   LEFT HEART CATH AND CORS/GRAFTS ANGIOGRAPHY N/A 03/31/2018   Procedure: LEFT HEART CATH AND CORS/GRAFTS ANGIOGRAPHY;  Surgeon: Mady Lonni, MD;  Location: [facility] INVASIVE CV LAB;  Service: Cardiovascular;  Laterality: N/A;   SHOULDER ARTHROSCOPY  WITH OPEN ROTATOR CUFF REPAIR Right 07-10-1999   TONSILLECTOMY AND ADENOIDECTOMY  1944   TRANSTHORACIC ECHOCARDIOGRAM  03-01-2011  DR VICTORY SHARPS   NORMAL LVF AND LV SIZE/ MILD LEFT ATRIAL ENLARGEMENT/ GRADE II DIASTOLIC DYSFUNCTION WITH ELEVATED LEFT ATRIAL PRESSURE/ MILD TO MODERATE MR/ MILD TR   TRANSURETHRAL RESECTION OF PROSTATE  04/09/2011   Procedure: TRANSURETHRAL RESECTION OF THE PROSTATE WITH GYRUS INSTRUMENTS;  Surgeon: Norleen JINNY Seltzer, MD;  Location: [facility];  Service: Urology;  Laterality: N/A;    Allergies  Allergen  Reactions   Tape Rash    ONLY USE PAPER TAPE ONLY USE PAPER TAPE   Iodine Rash    Rash when applied to skin   Niacin  And Related Hives and Other (See Comments)    FLUSHING itching    Outpatient Encounter Medications as of 10/28/2023  Medication Sig   acetaminophen  (TYLENOL ) 500 MG tablet Take 1,000 mg by mouth 2 (two) times daily.   amLODipine  (NORVASC ) 2.5 MG tablet Take 1 tablet (2.5 mg total) by mouth daily.   atorvastatin  (LIPITOR) 80 MG tablet Take one tablet nightly for high cholesterol   clopidogrel  (PLAVIX ) 75 MG tablet Take 75 mg by mouth daily.   cyanocobalamin  (,VITAMIN B-12,) 1000 MCG/ML injection Inject 1,000 mcg into the muscle every 30 (thirty) days.   dapagliflozin propanediol (FARXIGA) 10 MG TABS tablet Take 10 mg by mouth daily.   finasteride (PROSCAR) 5 MG tablet Take 5 mg by mouth daily.   furosemide  (LASIX ) 80 MG tablet Take 80 mg by mouth daily. Give 80mg  by mouth every 24 hours as needed for weight gain of 3lbs overnight or 5lbs in 1 week.   isosorbide  mononitrate (IMDUR ) 30 MG 24 hr tablet Take 30 mg by mouth at bedtime.   isosorbide  mononitrate (IMDUR ) 60 MG 24 hr tablet Take 60 mg by mouth daily after breakfast.   metoprolol  succinate (TOPROL -XL) 25 MG 24 hr tablet Take 25 mg by mouth every morning.   nitroGLYCERIN  (NITROSTAT ) 0.4 MG SL tablet PLACE 1 TABLET (0.4 MG TOTAL) UNDER THE TONGUE EVERY 5 (FIVE) MINUTES AS NEEDED FOR CHEST PAIN.   pantoprazole  (PROTONIX ) 40 MG tablet Take 1 tablet (40 mg total) by mouth 2 (two) times daily for 2 days.   polyethylene glycol (MIRALAX  / GLYCOLAX ) packet Take 17 g by mouth daily.   potassium chloride  SA (KLOR-CON  M) 20 MEQ tablet TAKE 1 TABLET BY MOUTH ONCE DAILY  *TAKE WITH FOOD* *DO NOT CRUSH OR CHEW* *MAY DISSOLVE*   tamsulosin  (FLOMAX ) 0.4 MG CAPS capsule Take 0.4 mg by mouth daily.   No facility-administered encounter medications on file as of 10/28/2023.    Review of Systems***  Immunization History  Administered  Date(s) Administered   INFLUENZA, HIGH DOSE SEASONAL PF 12/13/2013, 12/27/2014, 11/02/2015, 11/11/2017   Influenza-Unspecified 11/11/2016, 11/28/2020, 11/28/2021, 11/29/2022   Moderna Covid-19 Fall Seasonal Vaccine 50yrs & older 05/21/2022, 12/26/2022   Moderna Covid-19 Vaccine  Bivalent Booster 3yrs & up 12/21/2021   Moderna Sars-Covid-2 Vaccination 02/22/2019, 03/22/2019, 12/24/2019   Pneumococcal Conjugate-13 03/14/2014   Pneumococcal Polysaccharide-23 02/11/2013   Tdap 09/19/2015   Unspecified SARS-COV-2 Vaccination 12/24/2019, 06/30/2020, 11/03/2020, 07/10/2021, 05/23/2023   Zoster Recombinant(Shingrix) 06/12/2022, 10/11/2022   Zoster, Live 02/11/2013   Pertinent  Health Maintenance Due  Topic Date Due   Influenza Vaccine  09/12/2023      04/01/2018    8:00 PM 04/13/2018   12:58 PM 03/10/2019    7:17 PM 12/18/2020    2:40 AM 06/19/2023    1:56  PM  Fall Risk  Falls in the past year?  0  0   1  (RETIRED) Patient Fall Risk Level Moderate fall risk    Low fall risk    Patient at Risk for Falls Due to     History of fall(s);Impaired balance/gait;Impaired mobility     Data saved with a previous flowsheet row definition   Functional Status Survey:    Vitals:   10/28/23 1045 10/28/23 1046  BP: (!) 179/92 (!) 179/92  Pulse: 82   Resp: 20   Temp: 97.7 F (36.5 C)   SpO2: 96%   Weight: 184 lb 9.6 oz (83.7 kg)   Height: 5' 6 (1.676 m)    Body mass index is 29.8 kg/m. Physical Exam***  Labs reviewed: Recent Labs    10/30/22 0729 01/16/23 0000 05/12/23 0000 07/28/23 0000 10/09/23 0000  NA 134*   < > 132* 132* 135*  K 4.2   < > 4.1 3.9 4.2  CL 100   < > 98* 94* 98*  CO2 26   < > 27* 30* 31*  GLUCOSE 103*  --   --   --   --   BUN 10   < > 10 7 9   CREATININE 1.05   < > 0.9 0.9 1.1  CALCIUM  8.6*   < > 8.4* 9.1 9.0   < > = values in this interval not displayed.   Recent Labs    01/16/23 0000  AST 16  ALT 12  ALBUMIN 3.9   Recent Labs    10/30/22 0729  01/16/23 0000 04/10/23 0000 05/12/23 0000 07/28/23 0000  WBC 5.9   < > 7.5 5.7 7.5  NEUTROABS  --    < > 5,415.00 2,998.00 5,445.00  HGB 11.2*   < > 8.9* 10.5* 12.0*  HCT 35.2*   < > 27* 33* 37*  MCV 85.6  --   --   --   --   PLT 261   < > 232 248 264   < > = values in this interval not displayed.   Lab Results  Component Value Date   TSH 5.46 01/16/2023   Lab Results  Component Value Date   HGBA1C 5.8 (H) 05/27/2013   Lab Results  Component Value Date   CHOL 106 01/16/2023   HDL 39 01/16/2023   LDLCALC 50 01/16/2023   TRIG 85 01/16/2023   CHOLHDL 3.2 10/11/2021    Significant Diagnostic Results in last 30 days:  No results found.  Assessment/Plan No problem-specific Assessment & Plan notes found for this encounter.    Letitia Sabala K. Caro BODILY [facility] [phone]

## 2023-10-28 NOTE — Assessment & Plan Note (Signed)
Controlled on protonix.   

## 2023-10-28 NOTE — Assessment & Plan Note (Signed)
 Stable, no acute changes in cognitive or functional status, continue supportive care.

## 2023-10-28 NOTE — Progress Notes (Signed)
 Location:  Other [place].  Nursing Home Room Number: [facility] [id number] Place of Service:  SNF [phone]) Harlene An, NP  PCP: Abdul Fine, MD  Patient Care Team: Abdul Fine, MD as PCP - General (Family Medicine) End, Lonni, MD as PCP - Cardiology (Cardiology) Mittie Gaskin, MD as Referring Physician (Ophthalmology) An Harlene POUR, NP as Nurse Practitioner (Geriatric Medicine)  Extended Emergency Contact Information Primary Emergency Contact: Hebner,Rebecca  [place] Mobile Phone: [phone] Relation: Daughter  Goals of care: Advanced Directive information    09/17/2023    9:18 AM  Advanced Directives  Does Patient Have a Medical Advance Directive? Yes  Type of Advance Directive Out of facility DNR (pink MOST or yellow form)  Does patient want to make changes to medical advance directive? No - Patient declined     Chief Complaint  Patient presents with   Hypertension    Hypertension and Dizziness.     HPI:  The patient is a 89 year old male presenting for an acute visit due to hypertension and dizziness. His extensive history includes atrial fibrillation, coronary artery disease, heart failure, and dementia.  Today, while using the elliptical at the gym, he experienced mild central chest pain that resolved spontaneously without intervention. He reports a history of coronary artery bypass surgery many years ago and sees his cardiologist annually. Reports occasional chest pains.   He reported to staff that he has a hard time using the elliptical and it has caused problems in the past.   He drinks 4-5 cups of water daily. He denies dehydration, headache, vision changes, or chest pain at the time of this exam.  Past Medical History:  Diagnosis Date   Allergic rhinitis    Benign prostatic hypertrophy    CAD (coronary artery disease)    a. 1998 CABG x 4(LIMA-LAD, VG-D1-D2, VG-RPL); b. 2000 & 2006 PCI of the LCX/OM2; c.  03/2018 Cath: LM 25d, LAD 100ost/m, 27m/d, D2 100, fills via collats from D3, LCX 90ost/m ISR w/ evidence of stent fx, 15m, OM2 30 ISR, 80/80, RCA 70p, RPDA 60ost, 70, RPAV 100, VG->D1->D2 100p (culprit), LIMA->LAD nl, VG->RPL2 nl. EF 45%-->Med Rx; d. 03/2021 MV: ant/antsept/apical inf/inflat isch, EF 75%.   Duodenal diverticulum 02/11/2001   GERD (gastroesophageal reflux disease)    H/O hiatal hernia 02/11/2001   Heart murmur    a. 03/2018 Echo: EF 55-60%, DD, mild anterolateral HK.  Mild-mod TR. Mild Ca2+ of AoV and MV.   Hepatic lesion 02/11/2002   History of melanoma excision    FACE   Hypercholesteremia    Hypertension    Left carotid stenosis    > 50%  PER DUPLEX  03-28-2011   Mild mitral regurgitation    Perianal fistula    S/P CABG x 4    1988   S/P drug eluting coronary stent placement    POST CABG--  STENTING 2000  AND RESTENTING 2006 FOR IN-STENT STENOSIS   Stroke [facility])    Past Surgical History:  Procedure Laterality Date   CATARACT EXTRACTION W/ INTRAOCULAR LENS  IMPLANT, BILATERAL     CORONARY ANGIOPLASTY WITH STENT PLACEMENT  2000   PCI AND STENTING CIRCUMFLEX   CORONARY ANGIOPLASTY WITH STENT PLACEMENT  04-30-2004  DR VICTORY SHARPS   RE-INSTENT STENOSIS/  DRUG-ELUTING STENT OF PROXIMAL AND MID CIRCUMFLEX/ PCI MID CIRCUMFLEX THROUGH STENT STRUT/  WIDELY PATENT SAPHENOUS VEIN GRAFT AND  LIMA TO LAD GRAFT/ TOTAL OCCLUSION RIGHT CORONARY BEYOND THE POSTERIOR DESCENDING ARTERY BRANCH WITH 50% OSTIAL  NARROWING/ TOTAL OCCLUSION OF LAD AT THE OSTIUM OF THE LEFT MAIN   CORONARY ARTERY BYPASS GRAFT  1988   X5   EVALUATION UNDER ANESTHESIA WITH ANAL FISTULECTOMY N/A 07/22/2012   Procedure: EXAM UNDER ANESTHESIA WITH ANAL fistulotomy;  Surgeon: Bernarda Ned, MD;  Location: [facility] [place];  Service: General;  Laterality: N/A;   LEFT HEART CATH AND CORS/GRAFTS ANGIOGRAPHY N/A 03/31/2018   Procedure: LEFT HEART CATH AND CORS/GRAFTS ANGIOGRAPHY;  Surgeon: Mady Bruckner, MD;   Location: [facility] INVASIVE CV LAB;  Service: Cardiovascular;  Laterality: N/A;   SHOULDER ARTHROSCOPY WITH OPEN ROTATOR CUFF REPAIR Right 07-10-1999   TONSILLECTOMY AND ADENOIDECTOMY  1944   TRANSTHORACIC ECHOCARDIOGRAM  03-01-2011  DR VICTORY SHARPS   NORMAL LVF AND LV SIZE/ MILD LEFT ATRIAL ENLARGEMENT/ GRADE II DIASTOLIC DYSFUNCTION WITH ELEVATED LEFT ATRIAL PRESSURE/ MILD TO MODERATE MR/ MILD TR   TRANSURETHRAL RESECTION OF PROSTATE  04/09/2011   Procedure: TRANSURETHRAL RESECTION OF THE PROSTATE WITH GYRUS INSTRUMENTS;  Surgeon: Norleen JINNY Seltzer, MD;  Location: [facility];  Service: Urology;  Laterality: N/A;    Allergies  Allergen Reactions   Tape Rash    ONLY USE PAPER TAPE ONLY USE PAPER TAPE   Iodine Rash    Rash when applied to skin   Niacin  And Related Hives and Other (See Comments)    FLUSHING itching    Outpatient Encounter Medications as of 10/28/2023  Medication Sig   acetaminophen  (TYLENOL ) 500 MG tablet Take 1,000 mg by mouth 2 (two) times daily.   amLODipine  (NORVASC ) 2.5 MG tablet Take 1 tablet (2.5 mg total) by mouth daily.   atorvastatin  (LIPITOR) 80 MG tablet Take one tablet nightly for high cholesterol   clopidogrel  (PLAVIX ) 75 MG tablet Take 75 mg by mouth daily.   cyanocobalamin  (,VITAMIN B-12,) 1000 MCG/ML injection Inject 1,000 mcg into the muscle every 30 (thirty) days.   dapagliflozin propanediol (FARXIGA) 10 MG TABS tablet Take 10 mg by mouth daily.   finasteride (PROSCAR) 5 MG tablet Take 5 mg by mouth daily.   furosemide  (LASIX ) 80 MG tablet Take 80 mg by mouth daily. Give 80mg  by mouth every 24 hours as needed for weight gain of 3lbs overnight or 5lbs in 1 week.   isosorbide  mononitrate (IMDUR ) 30 MG 24 hr tablet Take 30 mg by mouth at bedtime.   isosorbide  mononitrate (IMDUR ) 60 MG 24 hr tablet Take 60 mg by mouth daily after breakfast.   metoprolol  succinate (TOPROL -XL) 25 MG 24 hr tablet Take 25 mg by mouth every morning.   nitroGLYCERIN  (NITROSTAT ) 0.4 MG SL  tablet PLACE 1 TABLET (0.4 MG TOTAL) UNDER THE TONGUE EVERY 5 (FIVE) MINUTES AS NEEDED FOR CHEST PAIN.   pantoprazole  (PROTONIX ) 40 MG tablet Take 1 tablet (40 mg total) by mouth 2 (two) times daily for 2 days.   polyethylene glycol (MIRALAX  / GLYCOLAX ) packet Take 17 g by mouth daily.   potassium chloride  SA (KLOR-CON  M) 20 MEQ tablet TAKE 1 TABLET BY MOUTH ONCE DAILY  *TAKE WITH FOOD* *DO NOT CRUSH OR CHEW* *MAY DISSOLVE*   tamsulosin  (FLOMAX ) 0.4 MG CAPS capsule Take 0.4 mg by mouth daily.   No facility-administered encounter medications on file as of 10/28/2023.    Review of Systems  Constitutional: Negative.   HENT: Negative.    Respiratory: Negative.    Cardiovascular:  Positive for chest pain.       While exercising  Gastrointestinal: Negative.   Genitourinary: Negative.   Musculoskeletal: Negative.  Neurological: Negative.   Psychiatric/Behavioral: Negative.      Immunization History  Administered Date(s) Administered   INFLUENZA, HIGH DOSE SEASONAL PF 12/13/2013, 12/27/2014, 11/02/2015, 11/11/2017   Influenza-Unspecified 11/11/2016, 11/28/2020, 11/28/2021, 11/29/2022   Moderna Covid-19 Fall Seasonal Vaccine 29yrs & older 05/21/2022, 12/26/2022   Moderna Covid-19 Vaccine  Bivalent Booster 11yrs & up 12/21/2021   Moderna Sars-Covid-2 Vaccination 02/22/2019, 03/22/2019, 12/24/2019   Pneumococcal Conjugate-13 03/14/2014   Pneumococcal Polysaccharide-23 02/11/2013   Tdap 09/19/2015   Unspecified SARS-COV-2 Vaccination 12/24/2019, 06/30/2020, 11/03/2020, 07/10/2021, 05/23/2023   Zoster Recombinant(Shingrix) 06/12/2022, 10/11/2022   Zoster, Live 02/11/2013   Pertinent  Health Maintenance Due  Topic Date Due   Influenza Vaccine  09/12/2023      04/01/2018    8:00 PM 04/13/2018   12:58 PM 03/10/2019    7:17 PM 12/18/2020    2:40 AM 06/19/2023    1:56 PM  Fall Risk  Falls in the past year?  0  0   1  (RETIRED) Patient Fall Risk Level Moderate fall risk    Low fall risk     Patient at Risk for Falls Due to     History of fall(s);Impaired balance/gait;Impaired mobility     Data saved with a previous flowsheet row definition   Functional Status Survey:    Vitals:   10/28/23 1045 10/28/23 1046  BP: (!) 179/92 (!) 179/92  Pulse: 82   Resp: 20   Temp: 97.7 F (36.5 C)   SpO2: 96%   Weight: 184 lb 9.6 oz (83.7 kg)   Height: 5' 6 (1.676 m)    Body mass index is 29.8 kg/m. Physical Exam Vitals reviewed.  Constitutional:      Appearance: Normal appearance.  HENT:     Head: Normocephalic and atraumatic.  Cardiovascular:     Rate and Rhythm: Normal rate and regular rhythm.     Pulses: Normal pulses.     Heart sounds: Normal heart sounds.  Pulmonary:     Effort: Pulmonary effort is normal.     Breath sounds: Normal breath sounds.  Abdominal:     General: Bowel sounds are normal.     Palpations: Abdomen is soft.  Musculoskeletal:     Right lower leg: Edema present.     Left lower leg: Edema present.  Skin:    General: Skin is warm and dry.  Neurological:     Mental Status: He is alert and oriented to person, place, and time. Mental status is at baseline.  Psychiatric:        Mood and Affect: Mood normal.     Labs reviewed: Recent Labs    10/30/22 0729 01/16/23 0000 05/12/23 0000 07/28/23 0000 10/09/23 0000  NA 134*   < > 132* 132* 135*  K 4.2   < > 4.1 3.9 4.2  CL 100   < > 98* 94* 98*  CO2 26   < > 27* 30* 31*  GLUCOSE 103*  --   --   --   --   BUN 10   < > 10 7 9   CREATININE 1.05   < > 0.9 0.9 1.1  CALCIUM  8.6*   < > 8.4* 9.1 9.0   < > = values in this interval not displayed.   Recent Labs    01/16/23 0000  AST 16  ALT 12  ALBUMIN 3.9   Recent Labs    10/30/22 0729 01/16/23 0000 04/10/23 0000 05/12/23 0000 07/28/23 0000  WBC 5.9   < > 7.5  5.7 7.5  NEUTROABS  --    < > 5,415.00 2,998.00 5,445.00  HGB 11.2*   < > 8.9* 10.5* 12.0*  HCT 35.2*   < > 27* 33* 37*  MCV 85.6  --   --   --   --   PLT 261   < > 232 248  264   < > = values in this interval not displayed.   Lab Results  Component Value Date   TSH 5.46 01/16/2023   Lab Results  Component Value Date   HGBA1C 5.8 (H) 05/27/2013   Lab Results  Component Value Date   CHOL 106 01/16/2023   HDL 39 01/16/2023   LDLCALC 50 01/16/2023   TRIG 85 01/16/2023   CHOLHDL 3.2 10/11/2021    Significant Diagnostic Results in last 30 days:  No results found.  Assessment/Plan  1. Coronary artery disease of native artery of native heart with stable angina pectoris (HCC) (Primary) - The condition has resolved without any pharmacologic intervention; ongoing monitoring recommended. Staff to monitor VS q shift for 24 hours.  - Use nitrostat  as needed. -sees cardiology yearly   2. Essential hypertension - BP peaked at 179/92 during chest pain but has since improved - Continue amlodipine  and metoprolol  as directed. Will monitor VS q shift for 24 hours.   Irfat Avie, AGPCNP Student - [facility] I personally was present during the history, physical exam and medical decision-making activities of this service and have verified that the service and findings are accurately documented in the student's note  Chloee Tena K. Caro BODILY [facility] [phone]

## 2023-10-28 NOTE — Assessment & Plan Note (Signed)
 Blood pressure well controlled, goal bp <140/90 Continue current medications and dietary modifications follow metabolic panel

## 2023-10-28 NOTE — Assessment & Plan Note (Signed)
 Stable, without recent chest pains, continues on IMDUR  BID, metoprolol , lipitor and plavix . Followed by cardiology

## 2023-10-28 NOTE — Assessment & Plan Note (Signed)
 Stable, continue on the tamsulosin  0.4mg  daily and proscar.

## 2023-10-28 NOTE — Assessment & Plan Note (Signed)
" >>  ASSESSMENT AND PLAN FOR CHRONIC DIASTOLIC HEART FAILURE (HCC) WRITTEN ON 10/28/2023  8:15 PM BY Amelianna Meller K, NP  Euvolemic, continues on metoprolol , farxiga, and lasix . Has LE edema but this has been stable.  "

## 2023-10-28 NOTE — Assessment & Plan Note (Addendum)
 Euvolemic, continues on metoprolol , farxiga, and lasix . Has LE edema but this has been stable.

## 2023-12-22 LAB — BASIC METABOLIC PANEL WITH GFR
BUN: 10 (ref 4–21)
CO2: 30 — AB (ref 13–22)
Chloride: 98 — AB (ref 99–108)
Creatinine: 1.2 (ref 0.6–1.3)
Glucose: 103
Potassium: 3.6 meq/L (ref 3.5–5.1)
Sodium: 136 — AB (ref 137–147)

## 2023-12-22 LAB — HEPATIC FUNCTION PANEL
ALT: 13 U/L (ref 10–40)
AST: 17 (ref 14–40)
Alkaline Phosphatase: 100 (ref 25–125)
Bilirubin, Total: 1

## 2023-12-22 LAB — CBC AND DIFFERENTIAL
HCT: 41 (ref 41–53)
Hemoglobin: 13.1 — AB (ref 13.5–17.5)
Neutrophils Absolute: 3952
Platelets: 248 K/uL (ref 150–400)
WBC: 6.5

## 2023-12-22 LAB — COMPREHENSIVE METABOLIC PANEL WITH GFR
Albumin: 4.3 (ref 3.5–5.0)
Calcium: 9.1 (ref 8.7–10.7)
Globulin: 2.5
eGFR: 58

## 2023-12-22 LAB — LIPID PANEL
Cholesterol: 124 (ref 0–200)
HDL: 40 (ref 35–70)
LDL Cholesterol: 61
Triglycerides: 146 (ref 40–160)

## 2023-12-22 LAB — CBC: RBC: 4.48 (ref 3.87–5.11)

## 2023-12-22 LAB — VITAMIN B12: Vitamin B-12: 405

## 2023-12-23 ENCOUNTER — Encounter: Payer: Self-pay | Admitting: Nurse Practitioner

## 2023-12-23 ENCOUNTER — Non-Acute Institutional Stay (SKILLED_NURSING_FACILITY): Payer: Self-pay | Admitting: Nurse Practitioner

## 2023-12-23 DIAGNOSIS — I5032 Chronic diastolic (congestive) heart failure: Secondary | ICD-10-CM

## 2023-12-23 DIAGNOSIS — F03A18 Unspecified dementia, mild, with other behavioral disturbance: Secondary | ICD-10-CM

## 2023-12-23 DIAGNOSIS — N401 Enlarged prostate with lower urinary tract symptoms: Secondary | ICD-10-CM

## 2023-12-23 DIAGNOSIS — I10 Essential (primary) hypertension: Secondary | ICD-10-CM

## 2023-12-23 DIAGNOSIS — E785 Hyperlipidemia, unspecified: Secondary | ICD-10-CM

## 2023-12-23 DIAGNOSIS — R351 Nocturia: Secondary | ICD-10-CM

## 2023-12-23 DIAGNOSIS — N1831 Chronic kidney disease, stage 3a: Secondary | ICD-10-CM

## 2023-12-23 DIAGNOSIS — I25118 Atherosclerotic heart disease of native coronary artery with other forms of angina pectoris: Secondary | ICD-10-CM

## 2023-12-23 DIAGNOSIS — K5904 Chronic idiopathic constipation: Secondary | ICD-10-CM

## 2023-12-23 DIAGNOSIS — K219 Gastro-esophageal reflux disease without esophagitis: Secondary | ICD-10-CM

## 2023-12-23 DIAGNOSIS — E538 Deficiency of other specified B group vitamins: Secondary | ICD-10-CM

## 2023-12-23 NOTE — Assessment & Plan Note (Signed)
 Stable, no acute changes in cognitive or functional status, continue supportive care.

## 2023-12-23 NOTE — Progress Notes (Signed)
 Location:  Other [facility].  Nursing Home Room Number: [facility] [id number] Place of Service:  SNF [phone]) Harlene An, NP  PCP: Laurence Locus, DO  Patient Care Team: Laurence Locus, DO as PCP - General (Internal Medicine) End, Lonni, MD as PCP - Cardiology (Cardiology) Mittie Gaskin, MD as Referring Physician (Ophthalmology) An Harlene POUR, NP as Nurse Practitioner (Geriatric Medicine)  Extended Emergency Contact Information Primary Emergency Contact: Hebner,Rebecca  [place] Mobile Phone: [phone] Relation: Daughter  Goals of care: Advanced Directive information    12/23/2023   11:44 AM  Advanced Directives  Does Patient Have a Medical Advance Directive? Yes  Type of Advance Directive Out of facility DNR (pink MOST or yellow form)  Does patient want to make changes to medical advance directive? No - Patient declined     Chief Complaint  Patient presents with   Medical Management of Chronic Issues    Medical Management of Chronic Issues.     HPI:  Pt is a 89 y.o. male seen today for medical management of chronic disease. Pt with hx of memory loss, CAD, BPH, CHF, constipation, hypertension, hyperlipidemia.  He reports he has been doing well.  He has no concerns regarding his medications  He reports he is feeling well.  He is involved in activities at [facility] daily He denies any pain No worsening LE edema, denies chest pains, shortness of breath, wheezing, cough or worsening LE edema.  No palpitations noted.  Reports bowels move well due to miralax  Reports he sleeps well and denies anxiety or depression.  Staff has no acute concerns.     Past Medical History:  Diagnosis Date   Allergic rhinitis    Benign prostatic hypertrophy    CAD (coronary artery disease)    a. 1998 CABG x 4(LIMA-LAD, VG-D1-D2, VG-RPL); b. 2000 & 2006 PCI of the LCX/OM2; c. 03/2018 Cath: LM 25d, LAD 100ost/m, 73m/d, D2 100, fills via collats from D3, LCX  90ost/m ISR w/ evidence of stent fx, 75m, OM2 30 ISR, 80/80, RCA 70p, RPDA 60ost, 70, RPAV 100, VG->D1->D2 100p (culprit), LIMA->LAD nl, VG->RPL2 nl. EF 45%-->Med Rx; d. 03/2021 MV: ant/antsept/apical inf/inflat isch, EF 75%.   Duodenal diverticulum 02/11/2001   GERD (gastroesophageal reflux disease)    H/O hiatal hernia 02/11/2001   Heart murmur    a. 03/2018 Echo: EF 55-60%, DD, mild anterolateral HK.  Mild-mod TR. Mild Ca2+ of AoV and MV.   Hepatic lesion 02/11/2002   History of melanoma excision    FACE   Hypercholesteremia    Hypertension    Left carotid stenosis    > 50%  PER DUPLEX  03-28-2011   Mild mitral regurgitation    Perianal fistula    S/P CABG x 4    1988   S/P drug eluting coronary stent placement    POST CABG--  STENTING 2000  AND RESTENTING 2006 FOR IN-STENT STENOSIS   Stroke [facility])    Past Surgical History:  Procedure Laterality Date   CATARACT EXTRACTION W/ INTRAOCULAR LENS  IMPLANT, BILATERAL     CORONARY ANGIOPLASTY WITH STENT PLACEMENT  2000   PCI AND STENTING CIRCUMFLEX   CORONARY ANGIOPLASTY WITH STENT PLACEMENT  04-30-2004  DR VICTORY SHARPS   RE-INSTENT STENOSIS/  DRUG-ELUTING STENT OF PROXIMAL AND MID CIRCUMFLEX/ PCI MID CIRCUMFLEX THROUGH STENT STRUT/  WIDELY PATENT SAPHENOUS VEIN GRAFT AND  LIMA TO LAD GRAFT/ TOTAL OCCLUSION RIGHT CORONARY BEYOND THE POSTERIOR DESCENDING ARTERY BRANCH WITH 50% OSTIAL NARROWING/ TOTAL OCCLUSION  OF LAD AT THE OSTIUM OF THE LEFT MAIN   CORONARY ARTERY BYPASS GRAFT  1988   X5   EVALUATION UNDER ANESTHESIA WITH ANAL FISTULECTOMY N/A 07/22/2012   Procedure: EXAM UNDER ANESTHESIA WITH ANAL fistulotomy;  Surgeon: Bernarda Ned, MD;  Location: [facility];  Service: General;  Laterality: N/A;   LEFT HEART CATH AND CORS/GRAFTS ANGIOGRAPHY N/A 03/31/2018   Procedure: LEFT HEART CATH AND CORS/GRAFTS ANGIOGRAPHY;  Surgeon: Mady Bruckner, MD;  Location: [facility] INVASIVE CV LAB;  Service: Cardiovascular;  Laterality: N/A;    SHOULDER ARTHROSCOPY WITH OPEN ROTATOR CUFF REPAIR Right 07-10-1999   TONSILLECTOMY AND ADENOIDECTOMY  1944   TRANSTHORACIC ECHOCARDIOGRAM  03-01-2011  DR VICTORY SHARPS   NORMAL LVF AND LV SIZE/ MILD LEFT ATRIAL ENLARGEMENT/ GRADE II DIASTOLIC DYSFUNCTION WITH ELEVATED LEFT ATRIAL PRESSURE/ MILD TO MODERATE MR/ MILD TR   TRANSURETHRAL RESECTION OF PROSTATE  04/09/2011   Procedure: TRANSURETHRAL RESECTION OF THE PROSTATE WITH GYRUS INSTRUMENTS;  Surgeon: Norleen JINNY Seltzer, MD;  Location: [facility];  Service: Urology;  Laterality: N/A;    Allergies  Allergen Reactions   Tape Rash    ONLY USE PAPER TAPE ONLY USE PAPER TAPE   Iodine Rash    Rash when applied to skin   Niacin  And Related Hives and Other (See Comments)    FLUSHING itching    Outpatient Encounter Medications as of 12/23/2023  Medication Sig   acetaminophen  (TYLENOL ) 500 MG tablet Take 1,000 mg by mouth 2 (two) times daily.   amLODipine  (NORVASC ) 2.5 MG tablet Take 1 tablet (2.5 mg total) by mouth daily.   atorvastatin  (LIPITOR) 80 MG tablet Take one tablet nightly for high cholesterol   clopidogrel  (PLAVIX ) 75 MG tablet Take 75 mg by mouth daily.   cyanocobalamin  (,VITAMIN B-12,) 1000 MCG/ML injection Inject 1,000 mcg into the muscle every 30 (thirty) days.   dapagliflozin propanediol (FARXIGA) 10 MG TABS tablet Take 10 mg by mouth daily.   finasteride (PROSCAR) 5 MG tablet Take 5 mg by mouth daily.   furosemide  (LASIX ) 80 MG tablet Take 80 mg by mouth daily. Give 40mg  by mouth every 24 hours as needed for weight gain of 3lbs overnight or 5lbs in 1 week.   isosorbide  mononitrate (IMDUR ) 30 MG 24 hr tablet Take 30 mg by mouth at bedtime.   isosorbide  mononitrate (IMDUR ) 60 MG 24 hr tablet Take 60 mg by mouth daily after breakfast.   metoprolol  succinate (TOPROL -XL) 25 MG 24 hr tablet Take 25 mg by mouth every morning.   nitroGLYCERIN  (NITROSTAT ) 0.4 MG SL tablet PLACE 1 TABLET (0.4 MG TOTAL) UNDER THE TONGUE EVERY 5 (FIVE) MINUTES AS  NEEDED FOR CHEST PAIN.   pantoprazole  (PROTONIX ) 40 MG tablet Take 1 tablet (40 mg total) by mouth 2 (two) times daily for 2 days.   polyethylene glycol (MIRALAX  / GLYCOLAX ) packet Take 17 g by mouth daily.   potassium chloride  SA (KLOR-CON  M) 20 MEQ tablet TAKE 1 TABLET BY MOUTH ONCE DAILY  *TAKE WITH FOOD* *DO NOT CRUSH OR CHEW* *MAY DISSOLVE*   tamsulosin  (FLOMAX ) 0.4 MG CAPS capsule Take 0.4 mg by mouth daily.   No facility-administered encounter medications on file as of 12/23/2023.    Review of Systems  Constitutional:  Negative for activity change, appetite change, fatigue and unexpected weight change.  HENT:  Negative for congestion and hearing loss.   Eyes: Negative.   Respiratory:  Negative for cough and shortness of breath.   Cardiovascular:  Negative for chest  pain, palpitations and leg swelling.  Gastrointestinal:  Negative for abdominal pain, constipation and diarrhea.  Genitourinary:  Negative for difficulty urinating and dysuria.  Musculoskeletal:  Negative for arthralgias and myalgias.  Skin:  Negative for color change and wound.  Neurological:  Negative for dizziness and weakness.  Psychiatric/Behavioral:  Negative for agitation, behavioral problems and confusion.      Immunization History  Administered Date(s) Administered   INFLUENZA, HIGH DOSE SEASONAL PF 12/13/2013, 12/27/2014, 11/02/2015, 11/11/2017   Influenza-Unspecified 11/11/2016, 11/28/2020, 11/28/2021, 11/29/2022, 11/25/2023   Moderna Covid-19 Fall Seasonal Vaccine 66yrs & older 05/21/2022, 12/26/2022   Moderna Covid-19 Vaccine  Bivalent Booster 82yrs & up 12/21/2021   Moderna Sars-Covid-2 Vaccination 02/22/2019, 03/22/2019, 12/24/2019   Pneumococcal Conjugate-13 03/14/2014   Pneumococcal Polysaccharide-23 02/11/2013   Tdap 09/19/2015   Unspecified SARS-COV-2 Vaccination 12/24/2019, 06/30/2020, 11/03/2020, 07/10/2021, 05/23/2023   Zoster Recombinant(Shingrix) 06/12/2022, 10/11/2022   Zoster, Live  02/11/2013   Pertinent  Health Maintenance Due  Topic Date Due   Influenza Vaccine  Completed      04/01/2018    8:00 PM 04/13/2018   12:58 PM 03/10/2019    7:17 PM 12/18/2020    2:40 AM 06/19/2023    1:56 PM  Fall Risk  Falls in the past year?  0  0   1  (RETIRED) Patient Fall Risk Level Moderate fall risk    Low fall risk    Patient at Risk for Falls Due to     History of fall(s);Impaired balance/gait;Impaired mobility     Data saved with a previous flowsheet row definition   Functional Status Survey:    Vitals:   12/23/23 1132 12/23/23 1147  BP: (!) 168/97 130/74  Pulse: 80   Resp: 20   Temp: 97.7 F (36.5 C)   SpO2: 96%   Weight: 193 lb 9.6 oz (87.8 kg)   Height: 5' 6 (1.676 m)    Body mass index is 31.25 kg/m. Physical Exam Constitutional:      General: He is not in acute distress.    Appearance: He is well-developed. He is not diaphoretic.  HENT:     Head: Normocephalic and atraumatic.     Right Ear: External ear normal.     Left Ear: External ear normal.     Mouth/Throat:     Pharynx: No oropharyngeal exudate.  Eyes:     Conjunctiva/sclera: Conjunctivae normal.     Pupils: Pupils are equal, round, and reactive to light.  Cardiovascular:     Rate and Rhythm: Normal rate and regular rhythm.     Heart sounds: Normal heart sounds.  Pulmonary:     Effort: Pulmonary effort is normal.     Breath sounds: Normal breath sounds.  Abdominal:     General: Bowel sounds are normal.     Palpations: Abdomen is soft.  Musculoskeletal:        General: No tenderness.     Cervical back: Normal range of motion and neck supple.     Right lower leg: Edema present.     Left lower leg: Edema present.  Skin:    General: Skin is warm and dry.  Neurological:     Mental Status: He is alert and oriented to person, place, and time.     Motor: Weakness present.     Gait: Gait abnormal (uses rollator).     Labs reviewed: Recent Labs    07/28/23 0000 10/09/23 0000  12/22/23 0000  NA 132* 135* 136*  K 3.9 4.2 3.6  CL 94* 98* 98*  CO2 30* 31* 30*  BUN 7 9 10   CREATININE 0.9 1.1 1.2  CALCIUM  9.1 9.0 9.1   Recent Labs    01/16/23 0000 12/22/23 0000  AST 16 17  ALT 12 13  ALKPHOS  --  100  ALBUMIN 3.9 4.3   Recent Labs    05/12/23 0000 07/28/23 0000 12/22/23 0000  WBC 5.7 7.5 6.5  NEUTROABS 2,998.00 5,445.00 3,952.00  HGB 10.5* 12.0* 13.1*  HCT 33* 37* 41  PLT 248 264 248   Lab Results  Component Value Date   TSH 5.46 01/16/2023   Lab Results  Component Value Date   HGBA1C 5.8 (H) 05/27/2013   Lab Results  Component Value Date   CHOL 124 12/22/2023   HDL 40 12/22/2023   LDLCALC 61 12/22/2023   TRIG 146 12/22/2023   CHOLHDL 3.2 10/11/2021    Significant Diagnostic Results in last 30 days:  No results found.  Assessment/Plan Vitamin B12 deficiency Conitnues on Vit B12 IM monthly B12 in normal range   Stage 3a chronic kidney disease (HCC) Chronic and stable Encourage proper hydration Follow metabolic panel Avoid nephrotoxic meds (NSAIDS)   Mild dementia (HCC) Stable, no acute changes in cognitive or functional status, continue supportive care.   Hyperlipidemia LDL goal <70 LDL at goal on lipitor 80 mg daily   GERD (gastroesophageal reflux disease) Controlled on protonix    Essential hypertension Blood pressure well controlled, goal bp <140/90 Continue current medications and dietary modifications follow metabolic panel   Coronary artery disease of native artery of native heart with stable angina pectoris Stable, without recent chest pains, continues on IMDUR  BID, metoprolol , lipitor and plavix . Followed by cardiology   Chronic idiopathic constipation Continues on miralax  daily Reports symptoms well controlled.   Chronic diastolic heart failure (HCC) Euvolemic, continues on metoprolol , farxiga, and lasix . Has LE edema but this has been stable.   BPH (benign prostatic hyperplasia) Stable, continue  on the tamsulosin  0.4mg  daily and proscar.  Without new or worsening symptoms.     Valentine Kuechle K. Caro BODILY [facility] [phone]

## 2023-12-23 NOTE — Assessment & Plan Note (Signed)
 LDL at goal on lipitor 80 mg daily

## 2023-12-23 NOTE — Assessment & Plan Note (Signed)
" >>  ASSESSMENT AND PLAN FOR CHRONIC DIASTOLIC HEART FAILURE (HCC) WRITTEN ON 12/23/2023  8:40 PM BY Darcel Zick K, NP  Euvolemic, continues on metoprolol , farxiga, and lasix . Has LE edema but this has been stable.  "

## 2023-12-23 NOTE — Assessment & Plan Note (Signed)
 Chronic and stable Encourage proper hydration Follow metabolic panel Avoid nephrotoxic meds (NSAIDS)

## 2023-12-23 NOTE — Assessment & Plan Note (Signed)
 Stable, continue on the tamsulosin  0.4mg  daily and proscar.  Without new or worsening symptoms.

## 2023-12-23 NOTE — Assessment & Plan Note (Signed)
Controlled on protonix.   

## 2023-12-23 NOTE — Assessment & Plan Note (Signed)
 Blood pressure well controlled, goal bp <140/90 Continue current medications and dietary modifications follow metabolic panel

## 2023-12-23 NOTE — Assessment & Plan Note (Signed)
 Stable, without recent chest pains, continues on IMDUR  BID, metoprolol , lipitor and plavix . Followed by cardiology

## 2023-12-23 NOTE — Assessment & Plan Note (Signed)
 Continues on miralax  daily Reports symptoms well controlled.

## 2023-12-23 NOTE — Assessment & Plan Note (Signed)
 Euvolemic, continues on metoprolol , farxiga, and lasix . Has LE edema but this has been stable.

## 2023-12-23 NOTE — Assessment & Plan Note (Signed)
 Conitnues on Vit B12 IM monthly B12 in normal range

## 2024-01-27 ENCOUNTER — Non-Acute Institutional Stay: Payer: Self-pay | Admitting: Nurse Practitioner

## 2024-01-27 ENCOUNTER — Encounter: Payer: Self-pay | Admitting: Nurse Practitioner

## 2024-01-27 DIAGNOSIS — N1831 Chronic kidney disease, stage 3a: Secondary | ICD-10-CM

## 2024-01-27 DIAGNOSIS — F03A18 Unspecified dementia, mild, with other behavioral disturbance: Secondary | ICD-10-CM

## 2024-01-27 DIAGNOSIS — I25118 Atherosclerotic heart disease of native coronary artery with other forms of angina pectoris: Secondary | ICD-10-CM

## 2024-01-27 DIAGNOSIS — K5904 Chronic idiopathic constipation: Secondary | ICD-10-CM

## 2024-01-27 DIAGNOSIS — I5032 Chronic diastolic (congestive) heart failure: Secondary | ICD-10-CM

## 2024-01-27 DIAGNOSIS — N401 Enlarged prostate with lower urinary tract symptoms: Secondary | ICD-10-CM

## 2024-01-27 DIAGNOSIS — K219 Gastro-esophageal reflux disease without esophagitis: Secondary | ICD-10-CM

## 2024-01-27 NOTE — Assessment & Plan Note (Signed)
 Controlled on protonix , he is taking twice daily No complaints of GERD Will decrease to daily and monitor symptoms

## 2024-01-27 NOTE — Assessment & Plan Note (Signed)
 Continues on miralax  daily Reports symptoms well controlled.

## 2024-01-27 NOTE — Progress Notes (Signed)
 Location:  Other [place].  Nursing Home Room Number: [facility], [place] [id number] Place of Service:  SNF [phone]) Jason An, NP  PCP: Laurence Locus, DO  Patient Care Team: Laurence Locus, DO as PCP - General (Internal Medicine) End, Lonni, MD as PCP - Cardiology (Cardiology) Mittie Gaskin, MD as Referring Physician (Ophthalmology) Velazquez Jason POUR, NP as Nurse Practitioner (Geriatric Medicine)  Extended Emergency Contact Information Primary Emergency Contact: Hebner,Rebecca  [place] Mobile Phone: [phone] Relation: Daughter  Goals of care: Advanced Directive information    12/23/2023   11:44 AM  Advanced Directives  Does Patient Have a Medical Advance Directive? Yes  Type of Advance Directive Out of facility DNR (pink MOST or yellow form)  Does patient want to make changes to medical advance directive? No - Patient declined     Chief Complaint  Patient presents with   Medical Management of Chronic Issues    Medical Management of Chronic Issues.     HPI:  Pt is a 89 y.o. male seen today for medical management of chronic disease.  Staff reports patient had cough that started earlier in the week. Overall this has improved. He denies shortness of breath, chest congestion, fatigue or malaise. Reports mild sinus congestion at this time.  No fevers or chills.   Overall he feels like he is doing well.  No constipation noted Denies anxiety or depression Reports he is sleeping well. No changes in urination. Reports LE edema has been well controlled and does not have worsening shortness.   Staff without concerns and he does not have complaints at this time.    Past Medical History:  Diagnosis Date   Allergic rhinitis    Benign prostatic hypertrophy    CAD (coronary artery disease)    a. 1998 CABG x 4(LIMA-LAD, VG-D1-D2, VG-RPL); b. 2000 & 2006 PCI of the LCX/OM2; c. 03/2018 Cath: LM 25d, LAD 100ost/m, 43m/d, D2 100, fills via collats from D3, LCX  90ost/m ISR w/ evidence of stent fx, 86m, OM2 30 ISR, 80/80, RCA 70p, RPDA 60ost, 70, RPAV 100, VG->D1->D2 100p (culprit), LIMA->LAD nl, VG->RPL2 nl. EF 45%-->Med Rx; d. 03/2021 MV: ant/antsept/apical inf/inflat isch, EF 75%.   Duodenal diverticulum 02/11/2001   GERD (gastroesophageal reflux disease)    H/O hiatal hernia 02/11/2001   Heart murmur    a. 03/2018 Echo: EF 55-60%, DD, mild anterolateral HK.  Mild-mod TR. Mild Ca2+ of AoV and MV.   Hepatic lesion 02/11/2002   History of melanoma excision    FACE   Hypercholesteremia    Hypertension    Left carotid stenosis    > 50%  PER DUPLEX  03-28-2011   Mild mitral regurgitation    Perianal fistula    S/P CABG x 4    1988   S/P drug eluting coronary stent placement    POST CABG--  STENTING 2000  AND RESTENTING 2006 FOR IN-STENT STENOSIS   Stroke [facility])    Past Surgical History:  Procedure Laterality Date   CATARACT EXTRACTION W/ INTRAOCULAR LENS  IMPLANT, BILATERAL     CORONARY ANGIOPLASTY WITH STENT PLACEMENT  2000   PCI AND STENTING CIRCUMFLEX   CORONARY ANGIOPLASTY WITH STENT PLACEMENT  04-30-2004  DR VICTORY SHARPS   RE-INSTENT STENOSIS/  DRUG-ELUTING STENT OF PROXIMAL AND MID CIRCUMFLEX/ PCI MID CIRCUMFLEX THROUGH STENT STRUT/  WIDELY PATENT SAPHENOUS VEIN GRAFT AND  LIMA TO LAD GRAFT/ TOTAL OCCLUSION RIGHT CORONARY BEYOND THE POSTERIOR DESCENDING ARTERY BRANCH WITH 50% OSTIAL NARROWING/ TOTAL OCCLUSION OF  LAD AT THE OSTIUM OF THE LEFT MAIN   CORONARY ARTERY BYPASS GRAFT  1988   X5   EVALUATION UNDER ANESTHESIA WITH ANAL FISTULECTOMY N/A 07/22/2012   Procedure: EXAM UNDER ANESTHESIA WITH ANAL fistulotomy;  Surgeon: Bernarda Ned, MD;  Location: [facility];  Service: General;  Laterality: N/A;   LEFT HEART CATH AND CORS/GRAFTS ANGIOGRAPHY N/A 03/31/2018   Procedure: LEFT HEART CATH AND CORS/GRAFTS ANGIOGRAPHY;  Surgeon: Mady Bruckner, MD;  Location: [facility] INVASIVE CV LAB;  Service: Cardiovascular;  Laterality: N/A;    SHOULDER ARTHROSCOPY WITH OPEN ROTATOR CUFF REPAIR Right 07-10-1999   TONSILLECTOMY AND ADENOIDECTOMY  1944   TRANSTHORACIC ECHOCARDIOGRAM  03-01-2011  DR VICTORY SHARPS   NORMAL LVF AND LV SIZE/ MILD LEFT ATRIAL ENLARGEMENT/ GRADE II DIASTOLIC DYSFUNCTION WITH ELEVATED LEFT ATRIAL PRESSURE/ MILD TO MODERATE MR/ MILD TR   TRANSURETHRAL RESECTION OF PROSTATE  04/09/2011   Procedure: TRANSURETHRAL RESECTION OF THE PROSTATE WITH GYRUS INSTRUMENTS;  Surgeon: Norleen JINNY Seltzer, MD;  Location: [facility];  Service: Urology;  Laterality: N/A;    Allergies[1]  Outpatient Encounter Medications as of 01/27/2024  Medication Sig   acetaminophen  (TYLENOL ) 500 MG tablet Take 1,000 mg by mouth 2 (two) times daily.   amLODipine  (NORVASC ) 2.5 MG tablet Take 1 tablet (2.5 mg total) by mouth daily.   atorvastatin  (LIPITOR) 80 MG tablet Take one tablet nightly for high cholesterol   clopidogrel  (PLAVIX ) 75 MG tablet Take 75 mg by mouth daily.   cyanocobalamin  (,VITAMIN B-12,) 1000 MCG/ML injection Inject 1,000 mcg into the muscle every 30 (thirty) days.   dapagliflozin propanediol (FARXIGA) 10 MG TABS tablet Take 10 mg by mouth daily.   dextromethorphan-guaiFENesin  (ROBITUSSIN-DM) 10-100 MG/5ML liquid Take 10 mLs by mouth every 4 (four) hours as needed for cough.   finasteride (PROSCAR) 5 MG tablet Take 5 mg by mouth daily.   furosemide  (LASIX ) 80 MG tablet Take 80 mg by mouth daily. Give 40mg  by mouth every 24 hours as needed for weight gain of 3lbs overnight or 5lbs in 1 week.   isosorbide  mononitrate (IMDUR ) 30 MG 24 hr tablet Take 30 mg by mouth at bedtime.   isosorbide  mononitrate (IMDUR ) 60 MG 24 hr tablet Take 60 mg by mouth daily after breakfast.   metoprolol  succinate (TOPROL -XL) 25 MG 24 hr tablet Take 25 mg by mouth every morning.   nitroGLYCERIN  (NITROSTAT ) 0.4 MG SL tablet PLACE 1 TABLET (0.4 MG TOTAL) UNDER THE TONGUE EVERY 5 (FIVE) MINUTES AS NEEDED FOR CHEST PAIN.   pantoprazole  (PROTONIX ) 40 MG tablet Take  1 tablet (40 mg total) by mouth 2 (two) times daily for 2 days.   polyethylene glycol (MIRALAX  / GLYCOLAX ) packet Take 17 g by mouth daily.   potassium chloride  SA (KLOR-CON  M) 20 MEQ tablet TAKE 1 TABLET BY MOUTH ONCE DAILY  *TAKE WITH FOOD* *DO NOT CRUSH OR CHEW* *MAY DISSOLVE*   tamsulosin  (FLOMAX ) 0.4 MG CAPS capsule Take 0.4 mg by mouth daily.   No facility-administered encounter medications on file as of 01/27/2024.    Review of Systems  Constitutional:  Negative for activity change, appetite change, fatigue and unexpected weight change.  HENT:  Positive for congestion. Negative for hearing loss.   Eyes: Negative.   Respiratory:  Negative for cough and shortness of breath.   Cardiovascular:  Negative for chest pain, palpitations and leg swelling.  Gastrointestinal:  Negative for abdominal pain, constipation and diarrhea.  Genitourinary:  Negative for difficulty urinating and dysuria.  Musculoskeletal:  Negative for arthralgias and myalgias.  Skin:  Negative for color change and wound.  Neurological:  Negative for dizziness and weakness.  Psychiatric/Behavioral:  Positive for confusion. Negative for agitation and behavioral problems.      Immunization History  Administered Date(s) Administered   INFLUENZA, HIGH DOSE SEASONAL PF 12/13/2013, 12/27/2014, 11/02/2015, 11/11/2017   Influenza-Unspecified 11/11/2016, 11/28/2020, 11/28/2021, 11/29/2022, 11/25/2023   Moderna Covid-19 Fall Seasonal Vaccine 73yrs & older 05/21/2022, 12/26/2022   Moderna Covid-19 Vaccine  Bivalent Booster 25yrs & up 12/21/2021   Moderna Sars-Covid-2 Vaccination 02/22/2019, 03/22/2019, 12/24/2019   Pneumococcal Conjugate-13 03/14/2014   Pneumococcal Polysaccharide-23 02/11/2013   Tdap 09/19/2015   Unspecified SARS-COV-2 Vaccination 12/24/2019, 06/30/2020, 11/03/2020, 07/10/2021, 05/23/2023   Zoster Recombinant(Shingrix) 06/12/2022, 10/11/2022   Zoster, Live 02/11/2013   Pertinent  Health Maintenance Due   Topic Date Due   Influenza Vaccine  Completed      04/01/2018    8:00 PM 04/13/2018   12:58 PM 03/10/2019    7:17 PM 12/18/2020    2:40 AM 06/19/2023    1:56 PM  Fall Risk  Falls in the past year?  0  0   1  (RETIRED) Patient Fall Risk Level Moderate fall risk    Low fall risk    Patient at Risk for Falls Due to     History of fall(s);Impaired balance/gait;Impaired mobility     Data saved with a previous flowsheet row definition   Functional Status Survey:    Vitals:   01/27/24 1156  BP: 137/78  Pulse: 78  Resp: 20  Temp: 98 F (36.7 C)  SpO2: 96%  Weight: 196 lb 8 oz (89.1 kg)  Height: 5' 6 (1.676 m)   Body mass index is 31.72 kg/m. Physical Exam Constitutional:      General: He is not in acute distress.    Appearance: He is well-developed. He is not diaphoretic.  HENT:     Head: Normocephalic and atraumatic.     Right Ear: External ear normal.     Left Ear: External ear normal.     Mouth/Throat:     Pharynx: No oropharyngeal exudate.  Eyes:     Conjunctiva/sclera: Conjunctivae normal.     Pupils: Pupils are equal, round, and reactive to light.  Cardiovascular:     Rate and Rhythm: Normal rate and regular rhythm.     Heart sounds: Normal heart sounds.  Pulmonary:     Effort: Pulmonary effort is normal.     Breath sounds: Normal breath sounds.  Abdominal:     General: Bowel sounds are normal.     Palpations: Abdomen is soft.  Musculoskeletal:        General: No tenderness.     Cervical back: Normal range of motion and neck supple.     Right lower leg: No edema.     Left lower leg: No edema.  Skin:    General: Skin is warm and dry.  Neurological:     Mental Status: He is alert and oriented to person, place, and time.     Labs reviewed: Recent Labs    07/28/23 0000 10/09/23 0000 12/22/23 0000  NA 132* 135* 136*  K 3.9 4.2 3.6  CL 94* 98* 98*  CO2 30* 31* 30*  BUN 7 9 10   CREATININE 0.9 1.1 1.2  CALCIUM  9.1 9.0 9.1   Recent Labs     12/22/23 0000  AST 17  ALT 13  ALKPHOS 100  ALBUMIN 4.3   Recent Labs  05/12/23 0000 07/28/23 0000 12/22/23 0000  WBC 5.7 7.5 6.5  NEUTROABS 2,998.00 5,445.00 3,952.00  HGB 10.5* 12.0* 13.1*  HCT 33* 37* 41  PLT 248 264 248   Lab Results  Component Value Date   TSH 5.46 01/16/2023   Lab Results  Component Value Date   HGBA1C 5.8 (H) 05/27/2013   Lab Results  Component Value Date   CHOL 124 12/22/2023   HDL 40 12/22/2023   LDLCALC 61 12/22/2023   TRIG 146 12/22/2023   CHOLHDL 3.2 10/11/2021    Significant Diagnostic Results in last 30 days:  No results found.  Assessment/Plan Stage 3a chronic kidney disease (HCC) Chronic and stable Encourage proper hydration Follow metabolic panel Avoid nephrotoxic meds (NSAIDS)   Mild dementia (HCC) Stable, no acute changes in cognitive or functional status, continue supportive care.   GERD (gastroesophageal reflux disease) Controlled on protonix , he is taking twice daily No complaints of GERD Will decrease to daily and monitor symptoms  Coronary artery disease of native artery of native heart with stable angina pectoris Stable, without recent chest pains, continues on IMDUR  BID, metoprolol , lipitor and plavix . Followed by cardiology   Chronic idiopathic constipation Continues on miralax  daily Reports symptoms well controlled.   Chronic heart failure with preserved ejection fraction (HFpEF) (HCC) Euvolemic on current regimen, continues on lasix  with metoprolol .   BPH (benign prostatic hyperplasia) Stable, continue on the tamsulosin  0.4mg  daily and proscar.  Without new or worsening symptoms.     Flem Enderle K. Caro BODILY [facility] [phone]       [1]  Allergies Allergen Reactions   Tape Rash    ONLY USE PAPER TAPE ONLY USE PAPER TAPE   Iodine Rash    Rash when applied to skin   Niacin  And Related Hives and Other (See Comments)    FLUSHING itching

## 2024-01-27 NOTE — Assessment & Plan Note (Signed)
 Stable, without recent chest pains, continues on IMDUR  BID, metoprolol , lipitor and plavix . Followed by cardiology

## 2024-01-27 NOTE — Assessment & Plan Note (Signed)
 Stable, continue on the tamsulosin  0.4mg  daily and proscar.  Without new or worsening symptoms.

## 2024-01-27 NOTE — Assessment & Plan Note (Signed)
 Chronic and stable Encourage proper hydration Follow metabolic panel Avoid nephrotoxic meds (NSAIDS)

## 2024-01-27 NOTE — Assessment & Plan Note (Signed)
 Stable, no acute changes in cognitive or functional status, continue supportive care.

## 2024-01-27 NOTE — Assessment & Plan Note (Signed)
 Euvolemic on current regimen, continues on lasix  with metoprolol .

## 2024-02-02 ENCOUNTER — Encounter: Payer: Self-pay | Admitting: Orthopedic Surgery

## 2024-02-02 ENCOUNTER — Encounter: Payer: Self-pay | Admitting: Internal Medicine

## 2024-02-02 ENCOUNTER — Encounter: Payer: Self-pay | Admitting: *Deleted

## 2024-02-02 ENCOUNTER — Non-Acute Institutional Stay (SKILLED_NURSING_FACILITY): Payer: Self-pay | Admitting: Orthopedic Surgery

## 2024-02-02 DIAGNOSIS — Z760 Encounter for issue of repeat prescription: Secondary | ICD-10-CM | POA: Diagnosis not present

## 2024-02-02 DIAGNOSIS — R051 Acute cough: Secondary | ICD-10-CM | POA: Diagnosis not present

## 2024-02-02 DIAGNOSIS — F03A18 Unspecified dementia, mild, with other behavioral disturbance: Secondary | ICD-10-CM

## 2024-02-02 DIAGNOSIS — I5032 Chronic diastolic (congestive) heart failure: Secondary | ICD-10-CM

## 2024-02-02 MED ORDER — PANTOPRAZOLE SODIUM 40 MG PO TBEC: 40.0000 mg | DELAYED_RELEASE_TABLET | Freq: Every day | ORAL | Status: AC

## 2024-02-02 NOTE — Progress Notes (Signed)
 error    This encounter was created in error - please disregard.

## 2024-02-02 NOTE — Progress Notes (Signed)
 " Location:  Other [place].  Nursing Home Room Number: [facility] [id number] Place of Service:  SNF [phone]) Provider:  Greig Cluster, NP  PCP: Laurence Locus, DO  Patient Care Team: Laurence Locus, DO as PCP - General (Internal Medicine) End, Lonni, MD as PCP - Cardiology (Cardiology) Mittie Gaskin, MD as Referring Physician (Ophthalmology) Caro Harlene POUR, NP as Nurse Practitioner (Geriatric Medicine)  Extended Emergency Contact Information Primary Emergency Contact: Hebner,Rebecca  [place] Mobile Phone: [phone] Relation: Daughter  Code Status:  DNR Goals of care: Advanced Directive information    12/23/2023   11:44 AM  Advanced Directives  Does Patient Have a Medical Advance Directive? Yes  Type of Advance Directive Out of facility DNR (pink MOST or yellow form)  Does patient want to make changes to medical advance directive? No - Patient declined     Chief Complaint  Patient presents with   Cough    Cough    HPI:  Pt is a 89 y.o. male seen today for acute visit due to ongoing cough.   He currently resides on the skilled nursing unit at [facility]. PMH: aortic atherosclerosis, CHF, HTN, HLD, ischemic cardiomyopathy, CAGB 1988 x 4 & 2020, PVD, GERD, constipation, dementia, CKD, obesity and leg swelling.   Poor historian due to dementia. 12/16 he c/o acute cough. Repeat covid/flu tests have been negative. No other cold symptoms reported at that time. He has been afebrile. Vitals have been stable. He was placed on droplet precautions and started on mucus relief medication. Mucus relief completed yesterday. He reports productive cough returned this morning. Denies other cold symptoms. Cough does not keep him awake at night. Sputum is clear. Denies chest pain or shortness of breath.   H/o CHF. LVEF 60-65%, grade I DD. Remains on furosemide  daily. See weight trends below.   09/19 BIMS score 13/15. CT head noted atrophy and moderate small vessel  ischemic changes 08/2017. No behaviors. Very pleasant today. He does have some hearing impairment. Not on medication. Ambulates with walker.   Recent weights:  12/17- 196.8 lbs  12/10- 196.5 lbs  12/03- 195 lbs  09/24- 184 lbs    Past Medical History:  Diagnosis Date   Allergic rhinitis    Benign prostatic hypertrophy    CAD (coronary artery disease)    a. 1998 CABG x 4(LIMA-LAD, VG-D1-D2, VG-RPL); b. 2000 & 2006 PCI of the LCX/OM2; c. 03/2018 Cath: LM 25d, LAD 100ost/m, 61m/d, D2 100, fills via collats from D3, LCX 90ost/m ISR w/ evidence of stent fx, 2m, OM2 30 ISR, 80/80, RCA 70p, RPDA 60ost, 70, RPAV 100, VG->D1->D2 100p (culprit), LIMA->LAD nl, VG->RPL2 nl. EF 45%-->Med Rx; d. 03/2021 MV: ant/antsept/apical inf/inflat isch, EF 75%.   Duodenal diverticulum 02/11/2001   GERD (gastroesophageal reflux disease)    H/O hiatal hernia 02/11/2001   Heart murmur    a. 03/2018 Echo: EF 55-60%, DD, mild anterolateral HK.  Mild-mod TR. Mild Ca2+ of AoV and MV.   Hepatic lesion 02/11/2002   History of melanoma excision    FACE   Hypercholesteremia    Hypertension    Left carotid stenosis    > 50%  PER DUPLEX  03-28-2011   Mild mitral regurgitation    Perianal fistula    S/P CABG x 4    1988   S/P drug eluting coronary stent placement    POST CABG--  STENTING 2000  AND RESTENTING 2006 FOR IN-STENT STENOSIS   Stroke [facility])    Past  Surgical History:  Procedure Laterality Date   CATARACT EXTRACTION W/ INTRAOCULAR LENS  IMPLANT, BILATERAL     CORONARY ANGIOPLASTY WITH STENT PLACEMENT  2000   PCI AND STENTING CIRCUMFLEX   CORONARY ANGIOPLASTY WITH STENT PLACEMENT  04-30-2004  DR VICTORY SHARPS   RE-INSTENT STENOSIS/  DRUG-ELUTING STENT OF PROXIMAL AND MID CIRCUMFLEX/ PCI MID CIRCUMFLEX THROUGH STENT STRUT/  WIDELY PATENT SAPHENOUS VEIN GRAFT AND  LIMA TO LAD GRAFT/ TOTAL OCCLUSION RIGHT CORONARY BEYOND THE POSTERIOR DESCENDING ARTERY BRANCH WITH 50% OSTIAL NARROWING/ TOTAL OCCLUSION OF LAD AT THE  OSTIUM OF THE LEFT MAIN   CORONARY ARTERY BYPASS GRAFT  1988   X5   EVALUATION UNDER ANESTHESIA WITH ANAL FISTULECTOMY N/A 07/22/2012   Procedure: EXAM UNDER ANESTHESIA WITH ANAL fistulotomy;  Surgeon: Bernarda Ned, MD;  Location: [facility];  Service: General;  Laterality: N/A;   LEFT HEART CATH AND CORS/GRAFTS ANGIOGRAPHY N/A 03/31/2018   Procedure: LEFT HEART CATH AND CORS/GRAFTS ANGIOGRAPHY;  Surgeon: Mady Bruckner, MD;  Location: [facility] INVASIVE CV LAB;  Service: Cardiovascular;  Laterality: N/A;   SHOULDER ARTHROSCOPY WITH OPEN ROTATOR CUFF REPAIR Right 07-10-1999   TONSILLECTOMY AND ADENOIDECTOMY  1944   TRANSTHORACIC ECHOCARDIOGRAM  03-01-2011  DR VICTORY SHARPS   NORMAL LVF AND LV SIZE/ MILD LEFT ATRIAL ENLARGEMENT/ GRADE II DIASTOLIC DYSFUNCTION WITH ELEVATED LEFT ATRIAL PRESSURE/ MILD TO MODERATE MR/ MILD TR   TRANSURETHRAL RESECTION OF PROSTATE  04/09/2011   Procedure: TRANSURETHRAL RESECTION OF THE PROSTATE WITH GYRUS INSTRUMENTS;  Surgeon: Norleen JINNY Seltzer, MD;  Location: [facility];  Service: Urology;  Laterality: N/A;    Allergies[1]  Outpatient Encounter Medications as of 02/02/2024  Medication Sig   acetaminophen  (TYLENOL ) 500 MG tablet Take 1,000 mg by mouth 2 (two) times daily.   amLODipine  (NORVASC ) 2.5 MG tablet Take 1 tablet (2.5 mg total) by mouth daily.   atorvastatin  (LIPITOR) 80 MG tablet Take one tablet nightly for high cholesterol   clopidogrel  (PLAVIX ) 75 MG tablet Take 75 mg by mouth daily.   cyanocobalamin  (,VITAMIN B-12,) 1000 MCG/ML injection Inject 1,000 mcg into the muscle every 30 (thirty) days.   dapagliflozin propanediol (FARXIGA) 10 MG TABS tablet Take 10 mg by mouth daily.   dextromethorphan-guaiFENesin  (ROBITUSSIN-DM) 10-100 MG/5ML liquid Take 10 mLs by mouth every 4 (four) hours as needed for cough.   finasteride (PROSCAR) 5 MG tablet Take 5 mg by mouth daily.   furosemide  (LASIX ) 80 MG tablet Take 80 mg by mouth daily. Give 40mg  by mouth  every 24 hours as needed for weight gain of 3lbs overnight or 5lbs in 1 week.   isosorbide  mononitrate (IMDUR ) 30 MG 24 hr tablet Take 30 mg by mouth at bedtime.   isosorbide  mononitrate (IMDUR ) 60 MG 24 hr tablet Take 60 mg by mouth daily after breakfast.   metoprolol  succinate (TOPROL -XL) 25 MG 24 hr tablet Take 25 mg by mouth every morning.   nitroGLYCERIN  (NITROSTAT ) 0.4 MG SL tablet PLACE 1 TABLET (0.4 MG TOTAL) UNDER THE TONGUE EVERY 5 (FIVE) MINUTES AS NEEDED FOR CHEST PAIN.   nystatin  ointment (MYCOSTATIN ) Apply 1 Application topically 2 (two) times daily.   pantoprazole  (PROTONIX ) 40 MG tablet Take 1 tablet (40 mg total) by mouth 2 (two) times daily for 2 days. (Patient taking differently: Take 40 mg by mouth daily.)   polyethylene glycol (MIRALAX  / GLYCOLAX ) packet Take 17 g by mouth daily.   potassium chloride  SA (KLOR-CON  M) 20 MEQ tablet TAKE 1 TABLET BY MOUTH ONCE DAILY  *  TAKE WITH FOOD* *DO NOT CRUSH OR CHEW* *MAY DISSOLVE*   tamsulosin  (FLOMAX ) 0.4 MG CAPS capsule Take 0.4 mg by mouth daily.   No facility-administered encounter medications on file as of 02/02/2024.    Review of Systems  Constitutional:  Negative for fatigue and fever.  HENT:  Positive for hearing loss. Negative for postnasal drip and sore throat.   Respiratory:  Positive for cough. Negative for shortness of breath and wheezing.   Cardiovascular:  Positive for leg swelling. Negative for chest pain.  Gastrointestinal:  Negative for abdominal distention and abdominal pain.  Genitourinary:  Positive for frequency. Negative for hematuria.  Musculoskeletal:  Positive for gait problem.  Skin:  Negative for wound.  Neurological:  Positive for weakness. Negative for dizziness and headaches.  Psychiatric/Behavioral:  Positive for confusion. Negative for dysphoric mood. The patient is not nervous/anxious.     Immunization History  Administered Date(s) Administered   INFLUENZA, HIGH DOSE SEASONAL PF 12/13/2013,  12/27/2014, 11/02/2015, 11/11/2017   Influenza-Unspecified 11/11/2016, 11/28/2020, 11/28/2021, 11/29/2022, 11/25/2023   Moderna Covid-19 Fall Seasonal Vaccine 36yrs & older 05/21/2022, 12/26/2022   Moderna Covid-19 Vaccine  Bivalent Booster 72yrs & up 12/21/2021   Moderna Sars-Covid-2 Vaccination 02/22/2019, 03/22/2019, 12/24/2019   Pneumococcal Conjugate-13 03/14/2014   Pneumococcal Polysaccharide-23 02/11/2013   Tdap 09/19/2015   Unspecified SARS-COV-2 Vaccination 12/24/2019, 06/30/2020, 11/03/2020, 07/10/2021, 05/23/2023   Zoster Recombinant(Shingrix) 06/12/2022, 10/11/2022   Zoster, Live 02/11/2013   Pertinent  Health Maintenance Due  Topic Date Due   Influenza Vaccine  Completed      04/01/2018    8:00 PM 04/13/2018   12:58 PM 03/10/2019    7:17 PM 12/18/2020    2:40 AM 06/19/2023    1:56 PM  Fall Risk  Falls in the past year?  0  0   1  (RETIRED) Patient Fall Risk Level Moderate fall risk    Low fall risk    Patient at Risk for Falls Due to     History of fall(s);Impaired balance/gait;Impaired mobility     Data saved with a previous flowsheet row definition   Functional Status Survey:    Vitals:   02/02/24 1243  BP: 138/70  Pulse: 78  Resp: 20  Temp: 97.7 F (36.5 C)  SpO2: 97%  Weight: 196 lb 12.8 oz (89.3 kg)  Height: 5' 6 (1.676 m)   Body mass index is 31.76 kg/m. Physical Exam Constitutional:      General: He is not in acute distress. HENT:     Head: Normocephalic.     Right Ear: Tympanic membrane normal.     Left Ear: Tympanic membrane normal.     Nose: Nose normal.     Mouth/Throat:     Mouth: Mucous membranes are moist.  Eyes:     General:        Right eye: No discharge.        Left eye: No discharge.  Cardiovascular:     Rate and Rhythm: Normal rate and regular rhythm.     Pulses: Normal pulses.     Heart sounds: Normal heart sounds.  Pulmonary:     Effort: Pulmonary effort is normal. No respiratory distress.     Breath sounds: Normal breath  sounds. No wheezing or rales.  Abdominal:     General: Bowel sounds are normal. There is no distension.     Palpations: Abdomen is soft.     Tenderness: There is no abdominal tenderness.  Musculoskeletal:     Cervical back: Neck supple.  Right lower leg: Edema present.     Left lower leg: Edema present.     Comments: BLE non pitting  Lymphadenopathy:     Cervical: No cervical adenopathy.  Skin:    General: Skin is warm.     Capillary Refill: Capillary refill takes less than 2 seconds.  Neurological:     General: No focal deficit present.     Mental Status: He is alert. Mental status is at baseline.     Gait: Gait abnormal.  Psychiatric:        Mood and Affect: Mood normal.     Labs reviewed: Recent Labs    07/28/23 0000 10/09/23 0000 12/22/23 0000  NA 132* 135* 136*  K 3.9 4.2 3.6  CL 94* 98* 98*  CO2 30* 31* 30*  BUN 7 9 10   CREATININE 0.9 1.1 1.2  CALCIUM  9.1 9.0 9.1   Recent Labs    12/22/23 0000  AST 17  ALT 13  ALKPHOS 100  ALBUMIN 4.3   Recent Labs    05/12/23 0000 07/28/23 0000 12/22/23 0000  WBC 5.7 7.5 6.5  NEUTROABS 2,998.00 5,445.00 3,952.00  HGB 10.5* 12.0* 13.1*  HCT 33* 37* 41  PLT 248 264 248   Lab Results  Component Value Date   TSH 5.46 01/16/2023   Lab Results  Component Value Date   HGBA1C 5.8 (H) 05/27/2013   Lab Results  Component Value Date   CHOL 124 12/22/2023   HDL 40 12/22/2023   LDLCALC 61 12/22/2023   TRIG 146 12/22/2023   CHOLHDL 3.2 10/11/2021    Significant Diagnostic Results in last 30 days:  No results found.  Assessment/Plan 1. Acute cough (Primary) - ongoing - covid/flu negative - about 10 lbs weight gain since 10/2023 - lung sounds clear  - extend mucus relief x 7 days  - discontinue droplet precautions   2. Chronic diastolic heart failure (HCC) - LVEF 60-65% - see above - BNP was 83 09/2020 - chronic edema to BLE, refuses compression  - plan to repeat BNP - cont furosemide    3. Mild  dementia with other behavioral disturbance, unspecified dementia type (HCC) - BIMS score 13/15 - no behaviors - dependent with some ADLs  - not on medication - ambulates with walker - cont skilled nursing   4. Medication refill - pantoprazole  (PROTONIX ) 40 MG tablet; Take 1 tablet (40 mg total) by mouth daily.   Family/ staff Communication: plan discussed with patient and nurse  Labs/tests ordered:  BNP          [1]  Allergies Allergen Reactions   Tape Rash    ONLY USE PAPER TAPE ONLY USE PAPER TAPE   Iodine Rash    Rash when applied to skin   Niacin  And Related Hives and Other (See Comments)    FLUSHING itching   "

## 2024-02-24 NOTE — Progress Notes (Signed)
 "  Cardiology Clinic Note   Date: 02/27/2024 ID: Jason Velazquez, DOB Sep 10, 1928, MRN [id number]  Primary Cardiologist:  Lonni Hanson, MD  Chief Complaint   Jason Velazquez is a 89 y.o. male who presents to the clinic today for routine follow up.   Patient Profile   Jason Velazquez is followed by Dr. Hanson for the history outlined below.      Past medical history significant for: CAD. CABG x 4 1997: LIMA to LAD, sequential SVG to D1 and D2, SVG to RPL. PCI to proximal LCx/OM 2000. LHC 04/24/2004 (angina): Significant in-stent restenosis proximal LCx.  Widely patent SVG and LIMA.  Total occlusion of LAD at the ostium of LM.  Total occlusion of RCA beyond posterior descending branch. Staged intervention 04/30/2004: PCI with DES proximal to mid LCx covering previously stented region.  PTCA of high-grade restenosis mid LCx via the previously placed stent with reduction from 95% to 80% with TIMI-3 flow. LHC 03/31/2018 (NSTEMI): Severe three-vessel coronary artery disease including chronic total occlusion of the ostial and mid LAD, in-stent restenosis of overlapping proximal LCx stents complicated by stent fracture, sequential 80% OM2 stenoses, 80% jailed AV groove LCx stenosis, 70% proximal RCA lesion, sequential 60% and 70% ostial and distal RPDA stenoses and chronic total occlusion of the rPL. Widely patent LIMA to LAD and SVG to rPL.SABRA Occlusion of sequential SVG to diagonal branches. Culprit lesion with acute occlusion on chronic subtotal occlusion, given that collaterals have already developed. Mildly to moderately reduced left ventricular systolic function (EF 45%) with mid anterior akinesis. Mildly reduced left ventricular filling pressure. Nuclear stress test 03/16/2021: Moderate to high risk study.  Region of ischemia noted in the basal to distal anterior wall, basal anteroseptal region, distal/apical inferior/inferolateral wall.  Normal wall motion.  No EKG changes concerning for ischemia at peak  stress or in recovery. Chronic diastolic heart failure. Echo 02/26/2022: EF 60 to 65%.  No RWMA.  Mild to moderate LVH.  Grade I DD.  Normal RV size/function.  Normal PA pressure, RVSP 37 mmHg.  Mild MR.  Aortic valve sclerosis without stenosis.  Mild to moderate TR. PAD. Hypertension. Hyperlipidemia. Lipid panel 12/22/2023: LDL 61, HDL 40, TG 124, total 146. GERD. CVA.  In summary, patient with extensive history of CAD s/p CABG times 05/31/1995 and further PCI as outlined above.  In October 2022 patient complained of bilateral clavicular pain and Imdur  dose was increased.  In November he had an episode of central chest pain waking him from sleep lasting 2 to 3 minutes.  He was evaluated in the ED and chest x-ray was concerning for possible left lower lobe infiltrate but CT showed no acute process.  Troponin negative x 2.  He was evaluated in the office in January 2023 with continued episodes of clavicular pain occurring at rest and awakening him from sleep.  He continued to walk a mile 6 days a week without symptoms or limitations.  Nuclear stress testing showed regions of ischemia as detailed above.  The study was reviewed by Dr. Hanson who recommended ongoing medical therapy reserving repeat LHC for worsening or more severe symptoms.  Isosorbide  was increased to 90 mg daily.  In August 2023 patient complained of continued bilateral clavicular pain when lying on his sides in bed at night.  He reported worsening lower extremity edema and increased weight.  Lasix  was increased.  Echo January 2024 showed normal LV/RV function as detailed above.  Patient was seen in clinic on 03/24/2023 for routine follow-up.  He reported 2 episodes of sharp central chest pain without any associated symptoms.  He reported 1 episode 4 weeks prior that resolved quickly not requiring NTG.  The second episode occurred 1 week prior to visit while walking back to his room for breakfast.  He opted to skip church and rest instead and  the pain resolved.  He was participating in his usual activities of exercising 5 days a week at his living facility with no pain or dyspnea.  Patient and daughter opted for medication titration before pursuing ischemic evaluation.  Isosorbide  was adjusted to 60 mg in the morning and 30 in the evening.   Patient was last seen in the office by me on 08/22/2023 for routine follow-up.  He was doing well at that time with no further episodes of chest pain.  No medication changes were made.     History of Present Illness    Today, patient is accompanied by an aide from [facility]. Patient denies shortness of breath, dyspnea on exertion, orthopnea or PND. He reports occasional lower extremity edema. He is wearing new socks that stop midway up his calf and he feels they are too tight. No chest pain, pressure, or tightness. No palpitations. He reports developing mild fatigue after walking for 1 mile in the mornings. He will usually go back to his room and take a short nap. He is then able to participate in other activities without difficulty. He participates in seated exercises and other group exercise classes 5-6 times a month.     ROS: All other systems reviewed and are otherwise negative except as noted in History of Present Illness.  EKGs/Labs Reviewed    EKG Interpretation Date/Time:  Friday February 27 2024 10:38:14 EST Ventricular Rate:  80 PR Interval:  180 QRS Duration:  92 QT Interval:  390 QTC Calculation: 449 R Axis:   -2  Text Interpretation: Normal sinus rhythm Nonspecific ST and T wave abnormality When compared with ECG of 22-Aug-2023 10:57, No significant change was found Confirmed by Loistine Sober [phone]) on 02/27/2024 10:44:42 AM   12/22/2023: ALT 13; AST 17; BUN 10; Creatinine 1.2; Potassium 3.6; Sodium 136   12/22/2023: Hemoglobin 13.1; WBC 6.5    Physical Exam    VS:  BP 120/72 (BP Location: Left Arm, Patient Position: Sitting, Cuff Size: Normal)   Pulse 80   Ht 5' 6  (1.676 m)   Wt 197 lb (89.4 kg)   SpO2 97%   BMI 31.80 kg/m  , BMI Body mass index is 31.8 kg/m.  GEN: Well nourished, well developed, in no acute distress. Neck: No JVD or carotid bruits. Cardiac:  RRR. No murmur. No rubs or gallops.   Respiratory:  Respirations regular and unlabored. Clear to auscultation without rales, wheezing or rhonchi. GI: Soft, nontender, nondistended. Extremities: Radials/DP/PT 2+ and equal bilaterally. No clubbing or cyanosis. No edema   Skin: Warm and dry, no rash. Neuro: Strength intact.  Assessment & Plan   CAD S/p CABG x 4 1997 and subsequent PCI with as detailed in patient profile.  Nuclear stress test February 2023 showed areas of ischemia with recommendation to continue medical treatment.  Patient denies chest pain, pressure or tightness. He is active walking a mile a day and participating in seated exercises and other group exercises 5-6 times a month. EKG without acute changes.  - Continue Plavix , amlodipine , atorvastatin , Toprol , isosorbide , as needed SL NTG.   Chronic diastolic heart failure Echo January 2024 showed normal LV/RV function, LVH,  Grade I DD, normal PA pressure, mild MR, mild to moderate TR.  Patient denies DOE, orthopnea or PND. He reports wearing new socks that he feels are too tight. He has compression socks at home that he wears sometimes. Edema improves with elevation. He has mild, nonpitting edema bilateral lower extremities.  Euvolemic and well compensated on exam. - Encouraged utilizing compression socks.  - Continue Lasix , isosorbide .   Hypertension BP today 120/72. No report of headaches or dizziness.  -Continue amlodipine , Toprol , isosorbide .   Hyperlipidemia LDL 61 November 2025, at goal. -Continue atorvastatin .  Disposition: Return in 6 months or sooner as needed.          Signed, Barnie HERO. Wilkie Zenon, DNP, NP-C  "

## 2024-02-25 ENCOUNTER — Non-Acute Institutional Stay (SKILLED_NURSING_FACILITY): Admitting: Internal Medicine

## 2024-02-25 ENCOUNTER — Encounter: Payer: Self-pay | Admitting: Internal Medicine

## 2024-02-25 DIAGNOSIS — G301 Alzheimer's disease with late onset: Secondary | ICD-10-CM

## 2024-02-25 DIAGNOSIS — N401 Enlarged prostate with lower urinary tract symptoms: Secondary | ICD-10-CM | POA: Diagnosis not present

## 2024-02-25 DIAGNOSIS — I10 Essential (primary) hypertension: Secondary | ICD-10-CM

## 2024-02-25 DIAGNOSIS — E785 Hyperlipidemia, unspecified: Secondary | ICD-10-CM | POA: Diagnosis not present

## 2024-02-25 DIAGNOSIS — K5904 Chronic idiopathic constipation: Secondary | ICD-10-CM

## 2024-02-25 DIAGNOSIS — E66811 Obesity, class 1: Secondary | ICD-10-CM

## 2024-02-25 DIAGNOSIS — I5032 Chronic diastolic (congestive) heart failure: Secondary | ICD-10-CM | POA: Diagnosis not present

## 2024-02-25 DIAGNOSIS — N1831 Chronic kidney disease, stage 3a: Secondary | ICD-10-CM

## 2024-02-25 DIAGNOSIS — K439 Ventral hernia without obstruction or gangrene: Secondary | ICD-10-CM

## 2024-02-25 DIAGNOSIS — F02A18 Dementia in other diseases classified elsewhere, mild, with other behavioral disturbance: Secondary | ICD-10-CM | POA: Diagnosis not present

## 2024-02-25 NOTE — Assessment & Plan Note (Signed)
 Stable.  He is not on any Alzheimer's medications.

## 2024-02-25 NOTE — Progress Notes (Signed)
 [facility] Routine Visit Progress Note    Location:  Other [place].  Nursing Home Room Number: [facility] [id number] Place of Service:  SNF (31)   Laurence Locus, DO   Patient Care Team: Laurence Locus, DO as PCP - General (Internal Medicine) End, Lonni, MD as PCP - Cardiology (Cardiology) Mittie Gaskin, MD as Referring Physician (Ophthalmology) Caro Harlene POUR, NP as Nurse Practitioner (Geriatric Medicine)   Extended Emergency Contact Information Primary Emergency Contact: Hebner,Rebecca  [place] Mobile Phone: [phone] Relation: Daughter   Goals of care: Advanced Directive information    02/25/2024   11:17 AM  Advanced Directives  Does Patient Have a Medical Advance Directive? Yes  Type of Advance Directive Out of facility DNR (pink MOST or yellow form)  Does patient want to make changes to medical advance directive? No - Patient declined    CODE STATUS: Do Not Resuscitate (DNR)   Chief Complaint  Patient presents with   Medical Management of Chronic Issues    Medical management of Chronic Issues.      HPI: Pt is a 89 y.o. male seen today for medical management of chronic disease.   Jason Velazquez is a 89 year old male who is seen for routine medical care at [facility].  He is a long-term care resident.  He was originally admitted to [facility] from assisted living on July 25, 2023.  Jason Velazquez has a history of BPH, hypertension, mixed hyperlipidemia, coronary disease, ischemic cardiomyopathy, chronic diastolic heart failure, CKD stage III A, constipation, mild dementia, ventral hernia.  He was sleeping while I entered the room.  He was easily awoken.  He states he has no medical concerns.  He is getting along well at [facility].   Past Medical History:  Diagnosis Date   Allergic rhinitis    Benign prostatic hypertrophy    CAD (coronary artery disease)    a. 1998 CABG x 4(LIMA-LAD, VG-D1-D2, VG-RPL); b. 2000 & 2006  PCI of the LCX/OM2; c. 03/2018 Cath: LM 25d, LAD 100ost/m, 15m/d, D2 100, fills via collats from D3, LCX 90ost/m ISR w/ evidence of stent fx, 35m, OM2 30 ISR, 80/80, RCA 70p, RPDA 60ost, 70, RPAV 100, VG->D1->D2 100p (culprit), LIMA->LAD nl, VG->RPL2 nl. EF 45%-->Med Rx; d. 03/2021 MV: ant/antsept/apical inf/inflat isch, EF 75%.   Duodenal diverticulum 02/11/2001   GERD (gastroesophageal reflux disease)    H/O hiatal hernia 02/11/2001   Heart murmur    a. 03/2018 Echo: EF 55-60%, DD, mild anterolateral HK.  Mild-mod TR. Mild Ca2+ of AoV and MV.   Hepatic lesion 02/11/2002   History of melanoma excision    FACE   Hypercholesteremia    Hypertension    Left carotid stenosis    > 50%  PER DUPLEX  03-28-2011   Mild mitral regurgitation    Perianal fistula    S/P CABG x 4    1988   S/P drug eluting coronary stent placement    POST CABG--  STENTING 2000  AND RESTENTING 2006 FOR IN-STENT STENOSIS   Stroke [facility])    Past Surgical History:  Procedure Laterality Date   CATARACT EXTRACTION W/ INTRAOCULAR LENS  IMPLANT, BILATERAL     CORONARY ANGIOPLASTY WITH STENT PLACEMENT  2000   PCI AND STENTING CIRCUMFLEX   CORONARY ANGIOPLASTY WITH STENT PLACEMENT  04-30-2004  DR VICTORY SHARPS   RE-INSTENT STENOSIS/  DRUG-ELUTING STENT OF PROXIMAL AND MID CIRCUMFLEX/ PCI MID CIRCUMFLEX THROUGH STENT STRUT/  WIDELY PATENT  SAPHENOUS VEIN GRAFT AND  LIMA TO LAD GRAFT/ TOTAL OCCLUSION RIGHT CORONARY BEYOND THE POSTERIOR DESCENDING ARTERY BRANCH WITH 50% OSTIAL NARROWING/ TOTAL OCCLUSION OF LAD AT THE OSTIUM OF THE LEFT MAIN   CORONARY ARTERY BYPASS GRAFT  1988   X5   EVALUATION UNDER ANESTHESIA WITH ANAL FISTULECTOMY N/A 07/22/2012   Procedure: EXAM UNDER ANESTHESIA WITH ANAL fistulotomy;  Surgeon: Bernarda Ned, MD;  Location: [facility] [place];  Service: General;  Laterality: N/A;   LEFT HEART CATH AND CORS/GRAFTS ANGIOGRAPHY N/A 03/31/2018   Procedure: LEFT HEART CATH AND CORS/GRAFTS ANGIOGRAPHY;  Surgeon:  Mady Bruckner, MD;  Location: [facility] INVASIVE CV LAB;  Service: Cardiovascular;  Laterality: N/A;   SHOULDER ARTHROSCOPY WITH OPEN ROTATOR CUFF REPAIR Right 07-10-1999   TONSILLECTOMY AND ADENOIDECTOMY  1944   TRANSTHORACIC ECHOCARDIOGRAM  03-01-2011  DR VICTORY SHARPS   NORMAL LVF AND LV SIZE/ MILD LEFT ATRIAL ENLARGEMENT/ GRADE II DIASTOLIC DYSFUNCTION WITH ELEVATED LEFT ATRIAL PRESSURE/ MILD TO MODERATE MR/ MILD TR   TRANSURETHRAL RESECTION OF PROSTATE  04/09/2011   Procedure: TRANSURETHRAL RESECTION OF THE PROSTATE WITH GYRUS INSTRUMENTS;  Surgeon: Norleen JINNY Seltzer, MD;  Location: [facility];  Service: Urology;  Laterality: N/A;     Allergies[1]   Outpatient Encounter Medications as of 02/25/2024  Medication Sig   acetaminophen  (TYLENOL ) 500 MG tablet Take 1,000 mg by mouth 2 (two) times daily.   amLODipine  (NORVASC ) 2.5 MG tablet Take 1 tablet (2.5 mg total) by mouth daily.   atorvastatin  (LIPITOR) 80 MG tablet Take one tablet nightly for high cholesterol   clopidogrel  (PLAVIX ) 75 MG tablet Take 75 mg by mouth daily.   cyanocobalamin  (,VITAMIN B-12,) 1000 MCG/ML injection Inject 1,000 mcg into the muscle every 30 (thirty) days.   dapagliflozin propanediol (FARXIGA) 10 MG TABS tablet Take 10 mg by mouth daily.   finasteride (PROSCAR) 5 MG tablet Take 5 mg by mouth daily.   furosemide  (LASIX ) 80 MG tablet Take 80 mg by mouth daily. Give 40mg  by mouth every 24 hours as needed for weight gain of 3lbs overnight or 5lbs in 1 week.   isosorbide  mononitrate (IMDUR ) 30 MG 24 hr tablet Take 30 mg by mouth at bedtime.   isosorbide  mononitrate (IMDUR ) 60 MG 24 hr tablet Take 60 mg by mouth daily after breakfast.   metoprolol  succinate (TOPROL -XL) 25 MG 24 hr tablet Take 25 mg by mouth every morning.   nitroGLYCERIN  (NITROSTAT ) 0.4 MG SL tablet PLACE 1 TABLET (0.4 MG TOTAL) UNDER THE TONGUE EVERY 5 (FIVE) MINUTES AS NEEDED FOR CHEST PAIN.   nystatin  ointment (MYCOSTATIN ) Apply 1 Application topically 2 (two)  times daily.   pantoprazole  (PROTONIX ) 40 MG tablet Take 1 tablet (40 mg total) by mouth daily.   polyethylene glycol (MIRALAX  / GLYCOLAX ) packet Take 17 g by mouth daily.   potassium chloride  SA (KLOR-CON  M) 20 MEQ tablet TAKE 1 TABLET BY MOUTH ONCE DAILY  *TAKE WITH FOOD* *DO NOT CRUSH OR CHEW* *MAY DISSOLVE*   tamsulosin  (FLOMAX ) 0.4 MG CAPS capsule Take 0.4 mg by mouth daily.   dextromethorphan-guaiFENesin  (ROBITUSSIN-DM) 10-100 MG/5ML liquid Take 10 mLs by mouth every 4 (four) hours as needed for cough. (Patient not taking: Reported on 02/25/2024)   No facility-administered encounter medications on file as of 02/25/2024.     Review of Systems  Constitutional: Negative.   HENT: Negative.    Eyes: Negative.   Respiratory: Negative.    Cardiovascular: Negative.   Gastrointestinal: Negative.   Endocrine: Negative.  Genitourinary: Negative.   Musculoskeletal: Negative.   Allergic/Immunologic: Negative.   Neurological: Negative.   Hematological: Negative.   Psychiatric/Behavioral: Negative.    All other systems reviewed and are negative.     Immunization History  Administered Date(s) Administered   INFLUENZA, HIGH DOSE SEASONAL PF 12/13/2013, 12/27/2014, 11/02/2015, 11/11/2017   Influenza-Unspecified 11/11/2016, 11/28/2020, 11/28/2021, 11/29/2022, 11/25/2023   Moderna Covid-19 Fall Seasonal Vaccine 55yrs & older 05/21/2022, 12/26/2022   Moderna Covid-19 Vaccine  Bivalent Booster 10yrs & up 12/21/2021   Moderna Sars-Covid-2 Vaccination 02/22/2019, 03/22/2019, 12/24/2019   Pneumococcal Conjugate-13 03/14/2014   Pneumococcal Polysaccharide-23 02/11/2013   Tdap 09/19/2015   Unspecified SARS-COV-2 Vaccination 12/24/2019, 06/30/2020, 11/03/2020, 07/10/2021, 05/23/2023   Zoster Recombinant(Shingrix) 06/12/2022, 10/11/2022   Zoster, Live 02/11/2013   Pertinent  Health Maintenance Due  Topic Date Due   Influenza Vaccine  Completed      04/13/2018   12:58 PM 03/10/2019    7:17 PM  12/18/2020    2:40 AM 06/19/2023    1:56 PM 02/02/2024    3:52 PM  Fall Risk  Falls in the past year? 0  0   1 0  Was there an injury with Fall?     0  Fall Risk Category Calculator     0  (RETIRED) Patient Fall Risk Level   Low fall risk     Patient at Risk for Falls Due to    History of fall(s);Impaired balance/gait;Impaired mobility Impaired balance/gait  Fall risk Follow up     Falls evaluation completed     Data saved with a previous flowsheet row definition   Functional Status Survey:     Vitals:   02/25/24 1112  BP: (!) 146/84  Pulse: 74  Resp: 20  Temp: 97.7 F (36.5 C)  SpO2: 97%  Weight: 197 lb 3.2 oz (89.4 kg)  Height: 5' 6 (1.676 m)   Body mass index is 31.83 kg/m. Physical Exam Vitals and nursing note reviewed.  Constitutional:      Comments: Appears younger than recorded age of 89 years old.  HENT:     Head: Normocephalic and atraumatic.     Ears:     Comments: Uses bilateral hearing aids and glasses. Cardiovascular:     Rate and Rhythm: Normal rate and regular rhythm.  Pulmonary:     Effort: Pulmonary effort is normal. No respiratory distress.     Breath sounds: No wheezing.  Abdominal:     General: Bowel sounds are normal. There is no distension.     Palpations: Abdomen is soft.     Tenderness: There is no abdominal tenderness.     Comments: Ventral hernia noted when trying to set up from a supine position.  Musculoskeletal:     Right lower leg: No edema.     Left lower leg: No edema.  Skin:    General: Skin is warm and dry.     Capillary Refill: Capillary refill takes less than 2 seconds.  Neurological:     Mental Status: He is alert and oriented to person, place, and time.     Comments: Able to get up from a seated position with the use of his rollator walker.  Able to ambulate down the hall without difficulty.      Labs reviewed: Recent Labs    07/28/23 0000 10/09/23 0000 12/22/23 0000  NA 132* 135* 136*  K 3.9 4.2 3.6  CL 94* 98*  98*  CO2 30* 31* 30*  BUN 7 9 10   CREATININE  0.9 1.1 1.2  CALCIUM  9.1 9.0 9.1   Recent Labs    12/22/23 0000  AST 17  ALT 13  ALKPHOS 100  ALBUMIN 4.3   Recent Labs    05/12/23 0000 07/28/23 0000 12/22/23 0000  WBC 5.7 7.5 6.5  NEUTROABS 2,998.00 5,445.00 3,952.00  HGB 10.5* 12.0* 13.1*  HCT 33* 37* 41  PLT 248 264 248   Lab Results  Component Value Date   TSH 5.46 01/16/2023   Lab Results  Component Value Date   HGBA1C 5.8 (H) 05/27/2013   Lab Results  Component Value Date   CHOL 124 12/22/2023   HDL 40 12/22/2023   LDLCALC 61 12/22/2023   TRIG 146 12/22/2023   CHOLHDL 3.2 10/11/2021     Assessment/Plan Chronic heart failure with preserved ejection fraction (HFpEF) (HCC) Continue on Farxiga 10 mg daily, Lasix  80 mg daily, Imdur  60 mg in the morning and 30 mg at bedtime, KCl 20 mEq daily, as needed Lasix  40 mg for weight gain greater than 5 pounds in 1 week, Toprol -XL 25 mg daily.  He is euvolemic.  Last echo was from January 2024 which showed LVEF of 60 to 65%.  Essential hypertension Continue on Norvasc  2.5 mg daily, Lasix  80 mg daily, Imdur  60 mg in the morning, 30 mg at bedtime, Toprol -XL 25 mg daily.  BP well-controlled.  Stage 3a chronic kidney disease (HCC) Last BUN and creatinine performed on December 23, 2023 showed a BUN of 10, creatinine 1.17  BPH (benign prostatic hyperplasia) Continue on Flomax  0.4 mg daily  Hyperlipidemia LDL goal <70 Stable.  Continue Lipitor 80 mg daily  Ventral hernia without obstruction or gangrene Stable.  Not incarcerated.  Chronic idiopathic constipation Stable.  Continue with MiraLAX  17 g daily  Mild dementia (HCC) Stable.  He is not on any Alzheimer's medications.  Obesity, Class I, BMI 30-34.9 Body mass index is 31.83 kg/m.   Camellia Door, DO [facility] [phone]      [1]  Allergies Allergen Reactions   Tape Rash    ONLY USE PAPER TAPE ONLY USE PAPER TAPE   Iodine  Rash    Rash when applied to skin   Niacin  And Related Hives and Other (See Comments)    FLUSHING itching

## 2024-02-25 NOTE — Assessment & Plan Note (Signed)
 Stable.  Not incarcerated.

## 2024-02-25 NOTE — Assessment & Plan Note (Signed)
 Continue on Flomax  0.4 mg daily

## 2024-02-25 NOTE — Assessment & Plan Note (Signed)
 Stable.  Continue with MiraLAX  17 g daily

## 2024-02-25 NOTE — Assessment & Plan Note (Addendum)
 Continue on Farxiga 10 mg daily, Lasix  80 mg daily, Imdur  60 mg in the morning and 30 mg at bedtime, KCl 20 mEq daily, as needed Lasix  40 mg for weight gain greater than 5 pounds in 1 week, Toprol -XL 25 mg daily.  He is euvolemic.  Last echo was from January 2024 which showed LVEF of 60 to 65%.

## 2024-02-25 NOTE — Assessment & Plan Note (Signed)
 Last BUN and creatinine performed on December 23, 2023 showed a BUN of 10, creatinine 1.17

## 2024-02-25 NOTE — Assessment & Plan Note (Signed)
Stable.  Continue Lipitor 80mg daily

## 2024-02-25 NOTE — Assessment & Plan Note (Signed)
Body mass index is 31.83 kg/m.

## 2024-02-25 NOTE — Assessment & Plan Note (Signed)
 Continue on Norvasc  2.5 mg daily, Lasix  80 mg daily, Imdur  60 mg in the morning, 30 mg at bedtime, Toprol -XL 25 mg daily.  BP well-controlled.

## 2024-02-27 ENCOUNTER — Encounter: Payer: Self-pay | Admitting: Student

## 2024-02-27 ENCOUNTER — Ambulatory Visit: Attending: Student | Admitting: Student

## 2024-02-27 VITALS — BP 120/72 | HR 80 | Ht 66.0 in | Wt 197.0 lb

## 2024-02-27 DIAGNOSIS — I5032 Chronic diastolic (congestive) heart failure: Secondary | ICD-10-CM | POA: Diagnosis not present

## 2024-02-27 DIAGNOSIS — I25810 Atherosclerosis of coronary artery bypass graft(s) without angina pectoris: Secondary | ICD-10-CM | POA: Insufficient documentation

## 2024-02-27 DIAGNOSIS — E785 Hyperlipidemia, unspecified: Secondary | ICD-10-CM | POA: Insufficient documentation

## 2024-02-27 DIAGNOSIS — I10 Essential (primary) hypertension: Secondary | ICD-10-CM | POA: Insufficient documentation

## 2024-02-27 NOTE — Patient Instructions (Signed)
 Medication Instructions:   Your physician recommends that you continue on your current medications as directed. Please refer to the Current Medication list given to you today.    *If you need a refill on your cardiac medications before your next appointment, please call your pharmacy*  Lab Work:  None ordered at this time   If you have labs (blood work) drawn today and your tests are completely normal, you will receive your results only by:  MyChart Message (if you have MyChart) OR  A paper copy in the mail If you have any lab test that is abnormal or we need to change your treatment, we will call you to review the results.  Testing/Procedures:  None ordered at this time   Referrals:  None ordered at this time   Follow-Up:  At [facility], you and your health needs are our priority.  As part of our continuing mission to provide you with exceptional heart care, our providers are all part of one team.  This team includes your primary Cardiologist (physician) and Advanced Practice Providers or APPs (Physician Assistants and Nurse Practitioners) who all work together to provide you with the care you need, when you need it.  Your next appointment:   5 - 6 month(s)  Provider:    Lonni Hanson, MD or Barnie Hila, NP    We recommend signing up for the patient portal called MyChart.  Sign up information is provided on this After Visit Summary.  MyChart is used to connect with patients for Virtual Visits (Telemedicine).  Patients are able to view lab/test results, encounter notes, upcoming appointments, etc.  Non-urgent messages can be sent to your provider as well.   To learn more about what you can do with MyChart, go to [email or web address].
